# Patient Record
Sex: Female | Born: 1945 | Race: White | Hispanic: No | Marital: Single | Smoking: Never smoker
Health system: Southern US, Community
[De-identification: ages and names within clinical notes are randomized; demographics above are authoritative.]

## PROBLEM LIST (undated history)

## (undated) DIAGNOSIS — G629 Polyneuropathy, unspecified: Secondary | ICD-10-CM

## (undated) DIAGNOSIS — R000 Tachycardia, unspecified: Secondary | ICD-10-CM

## (undated) DIAGNOSIS — H409 Unspecified glaucoma: Secondary | ICD-10-CM

## (undated) DIAGNOSIS — F419 Anxiety disorder, unspecified: Secondary | ICD-10-CM

## (undated) DIAGNOSIS — D649 Anemia, unspecified: Secondary | ICD-10-CM

## (undated) DIAGNOSIS — I639 Cerebral infarction, unspecified: Secondary | ICD-10-CM

## (undated) DIAGNOSIS — I5189 Other ill-defined heart diseases: Secondary | ICD-10-CM

## (undated) DIAGNOSIS — I10 Essential (primary) hypertension: Secondary | ICD-10-CM

## (undated) DIAGNOSIS — M7500 Adhesive capsulitis of unspecified shoulder: Secondary | ICD-10-CM

## (undated) DIAGNOSIS — H3530 Unspecified macular degeneration: Secondary | ICD-10-CM

## (undated) DIAGNOSIS — R51 Headache: Secondary | ICD-10-CM

## (undated) DIAGNOSIS — E785 Hyperlipidemia, unspecified: Secondary | ICD-10-CM

## (undated) DIAGNOSIS — E669 Obesity, unspecified: Secondary | ICD-10-CM

## (undated) DIAGNOSIS — F32A Depression, unspecified: Secondary | ICD-10-CM

## (undated) DIAGNOSIS — E119 Type 2 diabetes mellitus without complications: Secondary | ICD-10-CM

## (undated) DIAGNOSIS — M1990 Unspecified osteoarthritis, unspecified site: Secondary | ICD-10-CM

## (undated) DIAGNOSIS — H269 Unspecified cataract: Secondary | ICD-10-CM

## (undated) DIAGNOSIS — F329 Major depressive disorder, single episode, unspecified: Secondary | ICD-10-CM

## (undated) DIAGNOSIS — G4730 Sleep apnea, unspecified: Secondary | ICD-10-CM

## (undated) DIAGNOSIS — Z87442 Personal history of urinary calculi: Secondary | ICD-10-CM

## (undated) DIAGNOSIS — R519 Headache, unspecified: Secondary | ICD-10-CM

## (undated) DIAGNOSIS — R011 Cardiac murmur, unspecified: Secondary | ICD-10-CM

## (undated) HISTORY — DX: Type 2 diabetes mellitus without complications: E11.9

## (undated) HISTORY — DX: Essential (primary) hypertension: I10

## (undated) HISTORY — PX: JOINT REPLACEMENT: SHX530

## (undated) HISTORY — PX: CARDIAC CATHETERIZATION: SHX172

## (undated) HISTORY — DX: Other ill-defined heart diseases: I51.89

## (undated) HISTORY — PX: TONSILLECTOMY AND ADENOIDECTOMY: SHX28

## (undated) HISTORY — DX: Obesity, unspecified: E66.9

## (undated) HISTORY — DX: Sleep apnea, unspecified: G47.30

## (undated) HISTORY — DX: Hyperlipidemia, unspecified: E78.5

## (undated) HISTORY — PX: COLONOSCOPY: SHX174

## (undated) HISTORY — PX: UPPER GI ENDOSCOPY: SHX6162

## (undated) HISTORY — PX: OTHER SURGICAL HISTORY: SHX169

---

## 2006-08-02 ENCOUNTER — Ambulatory Visit (HOSPITAL_BASED_OUTPATIENT_CLINIC_OR_DEPARTMENT_OTHER): Admission: RE | Admit: 2006-08-02 | Discharge: 2006-08-02 | Payer: Self-pay | Admitting: Orthopedic Surgery

## 2007-02-23 ENCOUNTER — Ambulatory Visit: Payer: Self-pay | Admitting: *Deleted

## 2008-03-20 ENCOUNTER — Ambulatory Visit (HOSPITAL_COMMUNITY): Admission: RE | Admit: 2008-03-20 | Discharge: 2008-03-20 | Payer: Self-pay | Admitting: Orthopedic Surgery

## 2008-04-02 ENCOUNTER — Inpatient Hospital Stay (HOSPITAL_BASED_OUTPATIENT_CLINIC_OR_DEPARTMENT_OTHER): Admission: RE | Admit: 2008-04-02 | Discharge: 2008-04-02 | Payer: Self-pay | Admitting: Cardiology

## 2008-04-16 ENCOUNTER — Inpatient Hospital Stay (HOSPITAL_COMMUNITY): Admission: RE | Admit: 2008-04-16 | Discharge: 2008-04-20 | Payer: Self-pay | Admitting: Orthopedic Surgery

## 2008-05-10 ENCOUNTER — Ambulatory Visit: Admission: RE | Admit: 2008-05-10 | Discharge: 2008-05-10 | Payer: Self-pay | Admitting: Orthopedic Surgery

## 2008-05-10 ENCOUNTER — Encounter (INDEPENDENT_AMBULATORY_CARE_PROVIDER_SITE_OTHER): Payer: Self-pay | Admitting: Orthopedic Surgery

## 2008-05-10 ENCOUNTER — Ambulatory Visit: Payer: Self-pay | Admitting: Vascular Surgery

## 2008-08-22 ENCOUNTER — Encounter: Admission: RE | Admit: 2008-08-22 | Discharge: 2008-08-22 | Payer: Self-pay | Admitting: Orthopedic Surgery

## 2008-11-05 ENCOUNTER — Other Ambulatory Visit: Payer: Self-pay | Admitting: Orthopedic Surgery

## 2008-11-06 ENCOUNTER — Ambulatory Visit (HOSPITAL_BASED_OUTPATIENT_CLINIC_OR_DEPARTMENT_OTHER): Admission: RE | Admit: 2008-11-06 | Discharge: 2008-11-07 | Payer: Self-pay | Admitting: Orthopedic Surgery

## 2008-11-06 ENCOUNTER — Other Ambulatory Visit: Payer: Self-pay | Admitting: Orthopedic Surgery

## 2011-02-08 LAB — GLUCOSE, CAPILLARY
Glucose-Capillary: 137 mg/dL — ABNORMAL HIGH (ref 70–99)
Glucose-Capillary: 153 mg/dL — ABNORMAL HIGH (ref 70–99)
Glucose-Capillary: 91 mg/dL (ref 70–99)

## 2011-02-08 LAB — BASIC METABOLIC PANEL
GFR calc non Af Amer: >60 mL/min (ref >60)
Glucose, Bld: 196 mg/dL — ABNORMAL HIGH (ref 70–99)
Potassium: 3.9 mEq/L (ref 3.5–5.1)

## 2011-03-09 NOTE — Op Note (Signed)
Jessica [name], Jessica [name]           ACCOUNT NO.:  [id number]   MEDICAL RECORD NO.:  [id number]          PATIENT TYPE:  AMB   LOCATION:  DSC                          FACILITY:  [facility]   PHYSICIAN:  Feliberto Gottron. Turner Daniels, M.D.   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   PROCEDURE:  Right shoulder arthroscopic anterior-inferior acromioplasty,  formal distal clavicle excision and debridement of superior degenerative  tearing of the labrum.  She also had a partial thickness internally for  rotator cuff tear that was debrided.   SURGEON:  Feliberto Gottron.  Turner Daniels, MD   FIRST ASSISTANT:  Shirl Harris, PA-C   ANESTHETIC:  General endotracheal plus right interscalene block.   ESTIMATED BLOOD LOSS:  Minimal.   FLUID REPLACEMENT:  800 mL of crystalloid.   DRAINS PLACED:  None.   TOURNIQUET TIME:  None.   INDICATIONS FOR PROCEDURE:  A 65 year old patient of one of my partner's  Dr. Renae Fickle who has been treated for right shoulder and left shoulder  impingement syndrome over the last few years with anti-inflammatory  medicines, Thera-Band exercises and a couple of cortisone injections.  Her pain persists.  She has had an MRI scan showing fairly impressive AC  joint arthritis that is confirmed also on the x-rays, a fairly large  type 3 subacromial spur with calcifications of the insertion of the CA  ligament as well as some arthritic changes in the glenohumeral joint and  a partial-thickness to possible full-thickness tear of the supraspinatus  that is small and focal in nature.  In any event, she has  failed  conservative measures and now desires arthroscopic evaluation and  treatment.  She is 65 years old.  She is a diabetic on an insulin pump.  Her BMI is in excess of 40 and she is relatively sedentary.  She is not  a good candidate for any sort of rotator cuff tear if a cuff tear is  found and the plan is simply to decompress her, do a distal clavicle  excision and debride any tears of the articular  cartilage, the labral  cartilage or the rotator cuff.  The risks and benefits of surgery have  been discussed, questions answered.   DESCRIPTION OF PROCEDURE:  The patient identified by armband and  underwent right shoulder interscalene block anesthetic at [facility].  She was then taken to operating room 8 where the  appropriate anesthetic monitors were attached and general endotracheal  anesthesia was induced.  She received preoperative antibiotics and then  was placed in the beach chair position.  The right upper extremity  prepped and draped in the usual sterile fashion from the wrist to the  hemithorax.  Using a #11 blade, standard portals were made at 1.5 cm  anterior to the [facility] joint, lateral to the junction middle and posterior  thirds of the acromion and posterior to the posterolateral corner of  acromion process.  The inflow was placed anteriorly with gravity inflow  with normal saline with epinephrine solution, the arthroscope laterally  and a 4.2 great white sucker shaver posteriorly allowing subacromial  bursectomy and outlining of a fairly impressive and large subacromial  spur.  Using a 4.5 hooded Vortex bur, the subacromial spur was removed.  We used the ArthroCare wand for electrocautery and then evaluated the  rotator cuff which did have partial thickness tearing of the external  leaflet but nothing requiring debridement externally.  We directed our  attention to the [facility] joint which was denuded of articular cartilage and  removed the distal inferior 1 cm  of the clavicle.  We then switched  portals bringing the scope in posteriorly, the inflow laterally and the  bur anteriorly completing the distal clavicle excision.  At this point,  the arthroscope was repositioned into the glenohumeral joint using the  posterior portal where we documented some fairly significant  degenerative tearing of the superior labrum from anterior to posterior  and this was debrided back to a stable margin with a 3.5 gator sucker  shaver.  The subscapularis tendon insertion was intact as was the biceps  and biceps origin.  The supraspinatus did have a small full-thickness  tear at the insertion on the anterior tubercle, the greater tuberosity  and this was left alone.  The infraspinatus appeared to be intact as  well.  The shoulder was irrigated out with normal saline solution.  The  arthroscopic instruments removed and dressing of Xeroform, 4x4 dressing  sponges, ABD, paper tape and a sling applied.  The patient was laid  supine, awakened and taken to the recovery room without difficulty.      Feliberto Gottron. Turner Daniels, M.D.  Electronically Signed     FJR/MEDQ  D:  11/06/2008  T:  11/07/2008  Job:  [id number]

## 2011-03-09 NOTE — Discharge Summary (Signed)
Jessica [name], DHAMI           ACCOUNT NO.:  [id number]   MEDICAL RECORD NO.:  [id number]          PATIENT TYPE:  INP   LOCATION:  1606                         FACILITY:  [facility]   PHYSICIAN:  Deidre Ala, M.D.    DATE OF BIRTH:  12/14/1945   DATE OF ADMISSION:  04/16/2008  DATE OF DISCHARGE:  04/20/2008                               DISCHARGE SUMMARY   FINAL DIAGNOSES:  1. 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  2. Diabetes mellitus, type II, on insulin pump.  3. Hypertension.  4. Hyperlipidemia.  5. Obstructive sleep apnea.  6. Morbid obesity.  7. Postop blood loss anemia.   PROCEDURE:  04/19/2008 right total knee arthroplasty.   SURGEON:  1. Charlesetta Shanks, M.D.   HISTORY:  This is a 65 year old morbidly obese Caucasian female with  type II diabetes, has been on insulin pump.  The patient had been  followed by Dr. Renae Fickle for knee pain.  She also had shoulder pain in the  past.  Her knee pain has become more chronic over the years and she  subsequently has failed medical management and was now ready to have a  surgical intervention.  The patient was subsequently scheduled for  surgery.   HOSPITAL COURSE:  The patient was admitted to [facility] on  04/16/2008.  At that time she underwent a total knee arthroplasty of the  right knee.  The patient tolerated the procedure well.  No  intraoperative complications occurred.  Postoperatively, the patient had  no significant complications.  She did have postoperative blood loss  anemia and on the 3rd postoperative day her hemoglobin was 7.6,  hematocrit 22.4.  Because of this she was transfused 2 units of packed  red cells.  Her hemoglobin was improved in the next 24 hours.  She was  otherwise doing well.  She was working with physical therapy.  Her  incision was clean and dry at the time of discharge.  She had no signs  of calf pain or Homan's sign.  Peripheral pulses are intact.  Neuro was  grossly  intact.  She was going to be prepared for discharge and was  ready for discharge on 04/20/2008.   MEDICATIONS:  At the time of discharge her medications were:  1. Lovenox 30 mg subcu q. 12 hours.  2. Dilaudid 2 mg, 1-2 p.o. q. 4-6 h p.r.n. for pain.  3. Robaxin 750 mg, 1 p.o. q. 80 p.r.n. pain.   She was continued on her other medications she was taking prior to  admission which were:  1. Metoprolol, 25 mg b.i.d.  2. Diovan HCT 320/25 q.a.m.  3. Simvastatin 40 mg q. a.m.  4. She is on NovoLog insulin pump.  5. She will continue to take calcium 600 plus D daily.  6. She is to resume Dorzolamide 2% eye drops, 1 drop both eyes 2 times      a day.  7. Amlodipine besylate 5 mg q. A.m.   ALLERGIES:  She has multiple allergies.  She has an allergy to CODEINE,  OXYCODONE, IODINE, ADVIL, Z-PACKS AND HYDROCODONE.  FOLLOW UP:  The patient would follow up with Dr. Renae Fickle in approximately  10 days after discharge.   DISPOSITION:  She was subsequently discharged to [facility] skilled  nursing facility on 04/20/2008 in satisfactory and stable condition.      Phineas Semen, P.A.    ______________________________  Seth Bake. Charlesetta Shanks, M.D.    CL/MEDQ  D:  04/19/2008  T:  04/19/2008  Job:  [id number]

## 2011-03-09 NOTE — Op Note (Signed)
Jessica [name] NO.:  [id number]   MEDICAL RECORD NO.:  [id number]          PATIENT TYPE:  INP   LOCATION:  0003                         FACILITY:  [facility]   PHYSICIAN:  Deidre Ala, M.D.    DATE OF BIRTH:  29-Sep-1946   DATE OF PROCEDURE:  04/16/2008  DATE OF DISCHARGE:                               OPERATIVE REPORT   PREOPERATIVE DIAGNOSES:  1. End-stage degenerative joint disease right knee.  2. High body mass index.   POSTOPERATIVE DIAGNOSES:  1. End-stage degenerative joint disease right knee.  2. High body mass index.   PROCEDURE:  Right total knee arthroplasty using cemented DePuy  components LCS type with rotating platform, MBT stem and cemented.   SURGEON:  1. Charlesetta Shanks, M.D.   ASSISTANT:  Phineas Semen, P.A.-C.   ANESTHESIA:  General with endotracheal with femoral nerve block.   CULTURES:  None.   DRAINS:  Two medium Hemovacs.   ESTIMATED BLOOD LOSS:  100 mL.   BLOOD REPLACED:  Without.   TOURNIQUET TIME:  97 minutes.   PATHOLOGIC FINDINGS AND HISTORY:  Jessica [name] is a 65 year old female  who has pounds per square inch issues.  She has right knee pain which we  did a knee scope with a jumper's knee excision.  She continued to have  discomfort and ended up being bone on bone medially after getting  initially scoped for degenerative medial meniscus tear.  Ultimately we  tried to get her to get a gastric bypass but that was to no avail.  She  did lose a bit of weight.  In any case, she was bone on bone, had  cardiac workup and was admitted for surgery.  At surgery, she had  tricompartmental DJD that was severe.  Her case was arduous secondary to  her significant obesity, but we were able to ultimately fit her with a  MBT revision cemented tray size 3, a standard LCS prosthesis with a 20  mm rotating platform.  We used a universal revision stem, 75 x 12 mm,  and an oval dome, 3-peg patella  all with cement with tobramycin and  used gentamicin at the end of the case that shall be described.  We had  full extension with flexion to 9 and with excellent ligament stability.  The first cut on the tibia was set the 6 mark, we it would take 5 more  just to get enough length but the flexion gap was increased, we  ultimately fit a 20.  We took of 10 mm of distal femur and fit the 20  with full extension, flexion to about 90 degrees with a field flexion  gap with stable ligaments.  We did a slight medial release.  He should  be noted that she had excellent overall alignment.  She had severe  disease but this again was arduous due to her fat layers.   DESCRIPTION OF PROCEDURE:  With adequate anesthesia obtained using  endotracheal technique with a femoral nerve block, the patient was  placed in the supine position.  The right lower extremity was prepped  from the toes  to the tourniquet in standard fashion.  After standard  prepping and draping, Esmarch exsanguination was used, the tourniquet  let up to 350, later 375 mmHg.  The old midline patellar incision was  then used so we made a midline incision over the patella.  Up and down  incision was deepened sharply with a knife and hemostasis obtained using  the Bovie electrocoagulator.  Dissection was carried down to the medial  retinaculum which was incised longitudinally.  We then everted the  patella, excised the fat pad, removed both menisci and the cruciates.  I  then amputated the tibial spine and reamed down the canal all the way to  a 13, could not get to a 14.  The 13 guide was put in place with the  tibial cutting jig.  The jig was set at the -6 cut and then we felt that  it would still be too tight because she had a preoperative flexion  contracture of 30 degrees and only flexed more to about 85.  We made  that cut and then sized her to a standard, placed the intramedullary  guide, placed a 20 C clamp to match that.  We decided not to downsize it  because we would  have notched, so made the anterior-posterior cuts,  sized to a 20.  Remembering that she was so tight in extension to get  the 20 extension, we took 1 cm on the distal femur, would have to have  taken more on the femur or taken more as we did on the tibia to get the  20 in full extension.  As it was, we placed the 4 degree distal valgus  femoral cutting jig in place and made those cuts and fit her with 20 in  extension and 20 in flexion with ligament balance with slight medial  release carried out.  We then placed the finishing guide on the distal  femur and made those cuts.  I then exposed the proximal tibia sized to a  3, made the central peg hole and drilled down the canal for a 12 and  placed the trial with the keel punch done.  I then trialed the rotating  platform 20 mm with the femur, articulated the knee through a range of  motion with good stability with full extension and flexion to 90-95 only  limited by the posterior fat.  I then calipered the patella with 24,  placed the cutting jig in place for a 35 taking 8.5 cut down and left  remaining with the caliper about 15-16, placed the 3-peg patella jig,  made three holes and trialed the patella.  All trial components were  then removed while we checked components as they came on the field for  sizing.  Thorough jet lavage was carried out.  We then mixed cement with  the tobramycin.  She had been given IV prophylaxis preoperatively of  Cipro and Ancef.  She was allergic to a Z-Pak.  We called the pharmacy,  they said there was not a problem giving her the tobramycin in the  cement and we also used gentamicin topically with irrigation into the  wound at closure.  In any case, two batches of cement were mixed with  1.3 grams of tobramycin per batch.  We then used the cement gun.  We  then cemented on the tibial component after assembling it with the  cement to the flutes but not past, and impacted it and removed excess  cement.  We  then put on the rotating platform.  We then cemented on the  femoral component, impacted it, removed excess cement, had the knee in  full extension, removed excess cement.  I then cemented on the patella  component and impacted it and removed excess cement.  When the cement  had cured, just before additional irrigation was carried out with a jet  lavaged, the tourniquet was let down and bleeding points were  cauterized.  Hemovac drains were placed in the medial lateral portal and  brought out through the superior lateral portal.  Medial lateral gutters  were then brought out the superior lateral portal.  The wound was then  closed in layers with #1 Vicryl figure-of-eight on the retinaculum with  a running locking oversew of #1 PDS, 0 Vicryl on the subcu with a 2-0  quills was used in the mid subcu and then 2-0 Vicryl running  subcuticular and skin staples.  Hemovac was hooked up to Autovac after  injecting the knee with a mixture of 40 mg of gentamicin and 20 mL of  saline solution in one of the limbs then it was clamp with a  rubber shod and will be charged in about 6 hours postop.  We then placed  a bulky sterile compressive dressing with knee immobilizer.  The  patient, having tolerated procedure well, was awakened and taken to the  recovery room in satisfactory condition to be admitted for routine  postoperative care, analgesia and CPM.           ______________________________  V. Charlesetta Shanks, M.D.     VEP/MEDQ  D:  04/16/2008  T:  04/16/2008  Job:  [id number]   cc:   Sharlet Salina, M.D.  Fax: [phone]

## 2011-03-09 NOTE — H&P (Signed)
Jessica [name], Jessica [name]           ACCOUNT NO.:  [id number]   MEDICAL RECORD NO.:  [id number]          PATIENT TYPE:  INP   LOCATION:  1606                         FACILITY:  [facility]   PHYSICIAN:  Deidre Ala, M.D.    DATE OF BIRTH:  06-10-1946   DATE OF ADMISSION:  04/16/2008  DATE OF DISCHARGE:  04/20/2008                              HISTORY & PHYSICAL   CHIEF COMPLAINT:  Chronic right knee pain.   HISTORY:  This is a 65 year old Caucasian female who is followed by Dr.  Renae Fickle.  She has been treated for right knee pain for some time.  She has  had injections, and she has failed this, and she is ready for total knee  arthroplasty.  The problem is that she is morbidly obese.  We needed to  have her lose some weight, which we worked on with her.  Finally, she  got to the point where she just had to have a knee replacement.  We will  schedule her for such.  She continues to have a significant amount of  weight.  She is diabetic and on an insulin pump.  Risks of surgery were  explained to the patient.  The patient understood and agrees to surgery.   PAST MEDICAL HISTORY/REVIEW OF SYSTEMS:  The patient has had a history  of some chest pain in the past.  Had a stress test which apparently was  negative.  She does have a history of glaucoma.  She has had renal  ultrasounds in the past.  She has had a C-section in 1973, tonsillectomy  in 1954, shoulder manipulation in 2004.   She currently is on:  1. Metoprolol 25 mg b.i.d.  2. Diovan/HCT 325/25 q.a.m.  3. Simvastatin 40 mg q.a.m.  4. NovoLog insulin, with an insulin pump.  5. A multivitamin a day.  6. Calcium 600 with vitamin D once a day.  7. Fish oil once a day.  8. CoQ-10 daily.  9. Aspirin 81 mg daily.  10.Vitamin C daily.  11.Travatan 0.004% eye drops, 1 drop to each eye daily.  12.Dorzolamide HCl 2% to both eyes twice a day.  13.Amlodipine besylate 5 mg daily.   ALLERGIES:  1. PERCOCET.  2. VICODIN.  3. OXYCODONE.  4.  CODEINE.  5. IODINE.  6. Z-PAK.  7. ADVIL.   REVIEW OF SYSTEMS:  The patient is an insulin-dependent diabetic.  She  does have hypertension.  No COPD, PND, PAD, syncope, hemoptysis,  hematemesis, or asthma.   FAMILY HISTORY:  Noncontributory.   PHYSICAL EXAMINATION:  GENERAL:  This is a well-developed, morbidly  obese, 65 year old Caucasian female, alert, oriented, cooperative with  examination.  HEENT:  [place]/AT.  EOMI.  PERRL.  Oropharynx is clear.  Mucous membranes  are pink and moist.  NECK:  Supple, without JVP, lymphadenopathy, or thyromegaly.  No carotid  bruits noted.  Trachea is midline.  CHEST:  Symmetrical respirations, clear to auscultation.  No wheezes,  rhonchi, or rales noted.  CARDIOVASCULAR:  Regular rate and rhythm, without murmur, rub, or  gallop.  ABDOMEN:  Soft.  Bowel sounds are present in all  four quadrants.  No  palpable masses.  No HSM.  No hernias.  GU/RECTAL:  Deferred.  EXTREMITIES:  Without clubbing, cyanosis, or edema.  There is tenderness  to palpation in the right knee.  No significant swelling noted.  Peripheral pulses intact.  NEUROLOGIC:  Cranial nerves II-XII grossly intact, without focal  deficits.   IMPRESSION:  1. End-stage degenerative joint disease right knee, with chronic pain.  2. Type I diabetes mellitus, on insulin pump.  3. Hypertension.  4. History of renal stones.   PLAN:  The patient will undergo a total right knee arthroplasty with a  DePuy LCS system with MBT stem and rotating platform.      Phineas Semen, P.A.    ______________________________  Seth Bake. Charlesetta Shanks, M.D.    CL/MEDQ  D:  05/07/2008  T:  05/07/2008  Job:  [id number]

## 2011-03-09 NOTE — Cardiovascular Report (Signed)
NAMEANNEL, ZUNKER NO.:  [id number]   MEDICAL RECORD NO.:  [id number]          PATIENT TYPE:  OIB   LOCATION:  1962                         FACILITY:  [facility]   PHYSICIAN:  Jake Bathe, MD      DATE OF BIRTH:  02-09-1946   DATE OF PROCEDURE:  04/02/2008  DATE OF DISCHARGE:  04/02/2008                            CARDIAC CATHETERIZATION   PROCEDURES:  1. Left heart catheterization.  2. Left ventriculography.  3. Selective coronary angiography.   INDICATIONS:  A 65 year old female with morbid obesity, diabetes,  hypertension, hyperlipidemia, with a knee osteoarthritis, and with a  recent nuclear stress test concerning for anterior wall ischemia, here  for further preoperative risk evaluation.   PROCEDURE DETAILS:  Informed consent was obtained, risks and benefits  including stroke, heart attack, death, arterial damage, and renal  impairment were explained to the patient at length.  She was placed on  the catheterization table and prepped in a sterile fashion.  Lidocaine  1% was used to infiltrate the right groin for local anesthesia.  Femoral  head was visualized with fluoroscopy.  Using the modified Seldinger  technique, a 4-French sheath was placed in the right femoral artery.  Using fluoroscopic guidance, a Judkins left 4 catheter was selectively  cannulated into the left main artery and multiple views of the Omnipaque  hand injection were obtained.  This catheter was then exchanged over the  wire for a torque Williams Right Catheter which was used to selectively  cannulate the right coronary.  Multiple views of Omnipaque hand  injection were obtained.  This catheter was then exchanged for an angled  pigtail to use across the aortic valve into the left ventricle.  A  powered injection, left ventriculogram was obtained using 30 mL of dye.  Pullback was obtained across the aortic valve.  Catheter was then  brought to the level of L1 and an abdominal aortogram  was obtained.   FINDINGS:  1. Left main artery - short, no disease.  2. Left anterior descending artery - 1 large diagonal branch.  No      angiographically significant coronary artery disease.  3. Circumflex artery - 3 obtuse marginal branches.  No      angiographically significant coronary artery disease.  4. 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  No angiographically      significant coronary artery disease.  5. Left ventriculogram - normal ejection fraction estimated at 65%      with no wall motion abnormalities.  No mitral regurgitation.   HEMODYNAMICS:  Left ventricular systolic pressure was 130 with a left  ventricular end-diastolic pressure of 18 mmHg.  Aortic pressure was  130/67 with a mean of 95, with no aortic valve gradient.  Note, forearm  cuff pressure at the same time was 165 systolic, which is a 35-point  discrepancy between central pressures.  Abdominal aortogram - no  significant abdominal aortic atherosclerosis.  Renal arteries appear to  take off around the level of the mid body of L2.  No obvious renal  artery stenoses bilaterally.   IMPRESSIONS:  1. No angiographically significant coronary  artery disease.  2. Normal left ventricular ejection fraction, estimated at 65% with no      wall motion abnormalities.  3. No obvious atherosclerosis of the descending aorta with no obvious      renal artery stenosis bilaterally.   RECOMMENDATIONS:  The patient tolerated the procedure well.  No evidence  of IV contrast allergy.  She took her prednisone prior to procedure.  Given the angiographic findings as described above, I feel as though she  is at low risk from a cardiovascular standpoint for knee replacement.  Continue with her aggressive risk factor modification which includes  diabetes, hypertension, and hyperlipidemia control.  We will give her a  prescription for low-dose metoprolol, to be taken 1 week prior and 1  week after surgery.      Jake Bathe, MD  Electronically Signed     MCS/MEDQ  D:  04/02/2008  T:  04/03/2008  Job:  [id number]   cc:   Dr. Doristine Section  Pam Drown, M.D.

## 2011-03-12 NOTE — Op Note (Signed)
Jessica [name], Jessica [name]           ACCOUNT NO.:  [id number]   MEDICAL RECORD NO.:  [id number]          PATIENT TYPE:  AMB   LOCATION:  [facility]                         FACILITY:  [facility]   PHYSICIAN:  Deidre Ala, M.D.    DATE OF BIRTH:  10-Jul-1946   DATE OF PROCEDURE:  08/02/2006  DATE OF DISCHARGE:  08/02/2006                                 OPERATIVE REPORT   PREOPERATIVE DIAGNOSES:  1. Degenerative medial meniscus tear.  2. Prepatellar bursitis.  3. Osteoarthritis, knee.   POSTOPERATIVE DIAGNOSES:  1. Right knee degenerative posteromedial and inner rim lateral meniscus      tearing.  2. 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  3. Prepatellar bursitis.  4. Tight lateral retinaculum.  5. Parapatellar and track compartment synovitis.   PROCEDURE:  1. Right knee operative arthroscopy with degenerative medial and lateral      partial meniscectomies.  2. Abrasion ablation chondroplasties throughout.  3. Arthroscopic lateral retinacular release.  4. Open prepatellar bursa excision.   SURGEON:  1. Charlesetta Shanks, M.D.   ASSISTANT:  Clarene Reamer, PA-C.   ANESTHESIA:  General endotracheal.   CULTURES:  None.   DRAINS:  None.   ESTIMATED BLOOD LOSS:  Less than 100 cc, replaced without.   PATHOLOGIC FINDINGS AND HISTORY:  Ms. Jessica [name] is a high body mass index  female 65 years old who presented with knee pain Dr. Crista Luria with  painful knee.  She has failed cortisone injections.  She first came in in  April of this year and we injected her.  She does have diabetes and so  cortisone was not long-term helpful and did a elevate her sugar somewhat.  She came back in with knee pain, more catching, more locking, giving way,  and she had an MRI scan which suggested degenerative medial meniscus tear  and prepatellar bursitis with tricompartmental change.  She wanted to  proceed with knee arthroscopy and open excision of the prepatellar bursa due  to  continued knee pain and some mechanical symptoms.  She, at surgery, had  large symptomatic plicas.  She had an osteophyte on the medial femoral  condyle and in the notch.  ACL was intact.  She had a degenerative posterior  horn medial meniscus tear that was significant and unstable and an inner rim  lateral degenerative meniscus tear.  She had a tight lateral retinaculum.  She had a quarter sized defect, grade III, on the trochlea and posterior  patella, and the whole medial femoral condyle was grade IV over top of the  degenerative meniscus.  All of this was debrided and smoothed with lateral  retinacular release and synovectomy.  She had a prepatellar bursitis that  was basically palpable fronds of synovium with intermittent swelling and it  was bothering her with kneeling, etc., so with a positive MRI findings we  elected to excise it.  It was somewhat swollen from the leak-out from the  knee portals but we were able to get the wall out and closed it back down to  the fascia with a technique of through-and-through mattress Vicryl sutures.  Overall  smooth out and abrasion ablation was carried out to effect the knee  being a more functional unit with less debris.   PROCEDURE:  With adequate anesthesia obtained using endotracheal technique,  1 gram Ancef given IV prophylaxis.  The patient was placed in the supine  position.  The right lower extremity was prepped from the malleoli to the  knee using a Hibiclens scrub with alcohol due to a Betadine and iodine  allergy.  After standard prepping and draping, Esmarch exsanguination was  used.  The tourniquet was let up to 350 mmHg.  Superior lateral inflow  portals were made.  The knee was insufflated with normal saline with an  arthroscopic pump.  Medial and lateral scope portals were then made and the  joint was thoroughly inspected.  I then lysed the medial plica back to the  sidewall and lysed the medial band.  I then shaved the trochlear  defect in  the posterior patella, used a basket to further smooth the edges and an  ablator on one to smooth.  I then cleared the medial osteophyte and the  notch osteophytes with a rotary bur.  I then isolated the medial meniscus  tear and used basket and shaver to saucerize it back to a stable rim using  the ablator on one to smooth as well as the medial femoral condyle and  medial tibial plateau.  The inner rim of the lateral meniscus was also so  shaved and smoothed.  I then shaved out the lateral gutter synovitis.  I  then observed tilt and track and did an arthroscopic lateral retinacular  release from vastus lateralis to the joint line.  Further shaving on the  posterior patella was carried out as well as the pouch.  The knee was then  irrigated through the scope.  The portals were closed with 4-0 nylon.  We  then repainted with alcohol, made a longitudinal incision over the patella,  dissected down removing the bursal cyst wall down to the fascia.  Irrigation  was carried out.  We closed the subcu down to the fascia with a vertical  mattress suture through-and-through the skin with 2-0 Vicryl and 3-0 Vicryl  subcu and then skin staples.  Marcaine 0.5% was injected in and about the  portals with morphine.  A bulky sterile compressive dressing was applied  with Easy Wrap placed.  The patient having tolerated procedure well was  awakened and taken to recovery room in satisfactory condition to be  discharged per outpatient routine, given Percocet for pain, told call the  office for recheck tomorrow.           ______________________________  V. Charlesetta Shanks, M.D.     VEP/MEDQ  D:  08/02/2006  T:  08/04/2006  Job:  [id number]   cc:   Sharlet Salina, M.D.  Fax: [phone]

## 2011-07-21 LAB — DIFFERENTIAL
Eosinophils Relative: 2
Neutro Abs: 4.6
Neutrophils Relative %: 68

## 2011-07-21 LAB — CBC
HCT: 36
MCHC: 33.9
RDW: 15.3

## 2011-07-21 LAB — URINE CULTURE: Colony Count: >=100000

## 2011-07-21 LAB — URINALYSIS, ROUTINE W REFLEX MICROSCOPIC
Glucose, UA: NEGATIVE
Hgb urine dipstick: NEGATIVE
Ketones, ur: NEGATIVE
pH: 7

## 2011-07-21 LAB — COMPREHENSIVE METABOLIC PANEL
ALT: 24
Albumin: 4
Alkaline Phosphatase: 58
BUN: 17
Calcium: 9.8
Chloride: 101
Creatinine, Ser: 0.58
GFR calc Af Amer: >60
GFR calc non Af Amer: >60
Sodium: 140
Total Bilirubin: 0.5

## 2011-07-21 LAB — URINE MICROSCOPIC-ADD ON

## 2011-07-21 LAB — PROTIME-INR
INR: 0.9
Prothrombin Time: 12.8

## 2011-07-22 LAB — BASIC METABOLIC PANEL
BUN: 8
BUN: 9
CO2: 28
Calcium: 8.4
Chloride: 100
Chloride: 103
Creatinine, Ser: 0.57
Creatinine, Ser: 0.62
GFR calc Af Amer: >60
Glucose, Bld: 109 — ABNORMAL HIGH
Potassium: 3.7

## 2011-07-22 LAB — CBC
HCT: 22.4 — ABNORMAL LOW
HCT: 26.9 — ABNORMAL LOW
HCT: 27.8 — ABNORMAL LOW
HCT: 37.1
Hemoglobin: 12.7
Hemoglobin: 9.7 — ABNORMAL LOW
MCHC: 34.5
MCV: 78.1
MCV: 78.9
MCV: 79.3
MCV: 80.7
Platelets: 147 — ABNORMAL LOW
Platelets: 152
Platelets: 171
RBC: 2.84 — ABNORMAL LOW
RBC: 3.06 — ABNORMAL LOW
RBC: 4.75
RDW: 15.4
RDW: 15.4
WBC: 10.2
WBC: 8.2
WBC: 8.7
WBC: 9.5
WBC: 9.9

## 2011-07-22 LAB — DIFFERENTIAL
Basophils Absolute: 0
Lymphocytes Relative: 18
Lymphs Abs: 1.4
Monocytes Relative: 7
Neutro Abs: 6

## 2011-07-22 LAB — COMPREHENSIVE METABOLIC PANEL
ALT: 21
AST: 19
Alkaline Phosphatase: 60
BUN: 15
CO2: 30
GFR calc Af Amer: >60
GFR calc non Af Amer: >60
Sodium: 140

## 2011-07-22 LAB — URINE MICROSCOPIC-ADD ON

## 2011-07-22 LAB — URINALYSIS, ROUTINE W REFLEX MICROSCOPIC
Glucose, UA: NEGATIVE
Hgb urine dipstick: NEGATIVE
Protein, ur: NEGATIVE
Specific Gravity, Urine: 1.017
Urobilinogen, UA: 0.2

## 2011-07-22 LAB — URINE CULTURE: Special Requests: NEGATIVE

## 2011-07-22 LAB — TYPE AND SCREEN

## 2011-07-22 LAB — PROTIME-INR: INR: 0.9

## 2011-07-22 LAB — APTT: aPTT: 26

## 2011-07-22 LAB — POCT I-STAT GLUCOSE: Operator id: 133881

## 2011-11-04 DIAGNOSIS — H401290 Low-tension glaucoma, unspecified eye, stage unspecified: Secondary | ICD-10-CM | POA: Diagnosis not present

## 2011-11-04 DIAGNOSIS — H04129 Dry eye syndrome of unspecified lacrimal gland: Secondary | ICD-10-CM | POA: Diagnosis not present

## 2011-11-04 DIAGNOSIS — H409 Unspecified glaucoma: Secondary | ICD-10-CM | POA: Diagnosis not present

## 2011-11-04 DIAGNOSIS — H1045 Other chronic allergic conjunctivitis: Secondary | ICD-10-CM | POA: Diagnosis not present

## 2011-11-09 DIAGNOSIS — M25519 Pain in unspecified shoulder: Secondary | ICD-10-CM | POA: Diagnosis not present

## 2011-11-09 DIAGNOSIS — M5412 Radiculopathy, cervical region: Secondary | ICD-10-CM | POA: Diagnosis not present

## 2011-11-09 DIAGNOSIS — R29898 Other symptoms and signs involving the musculoskeletal system: Secondary | ICD-10-CM | POA: Diagnosis not present

## 2011-11-09 DIAGNOSIS — M5030 Other cervical disc degeneration, unspecified cervical region: Secondary | ICD-10-CM | POA: Diagnosis not present

## 2011-11-15 DIAGNOSIS — M25519 Pain in unspecified shoulder: Secondary | ICD-10-CM | POA: Diagnosis not present

## 2011-11-15 DIAGNOSIS — M810 Age-related osteoporosis without current pathological fracture: Secondary | ICD-10-CM | POA: Diagnosis not present

## 2011-11-15 DIAGNOSIS — R29898 Other symptoms and signs involving the musculoskeletal system: Secondary | ICD-10-CM | POA: Diagnosis not present

## 2011-11-17 DIAGNOSIS — I10 Essential (primary) hypertension: Secondary | ICD-10-CM | POA: Diagnosis not present

## 2011-11-22 DIAGNOSIS — M25519 Pain in unspecified shoulder: Secondary | ICD-10-CM | POA: Diagnosis not present

## 2011-11-22 DIAGNOSIS — R29898 Other symptoms and signs involving the musculoskeletal system: Secondary | ICD-10-CM | POA: Diagnosis not present

## 2011-11-24 DIAGNOSIS — M25519 Pain in unspecified shoulder: Secondary | ICD-10-CM | POA: Diagnosis not present

## 2011-11-24 DIAGNOSIS — R29898 Other symptoms and signs involving the musculoskeletal system: Secondary | ICD-10-CM | POA: Diagnosis not present

## 2011-11-26 DIAGNOSIS — R29898 Other symptoms and signs involving the musculoskeletal system: Secondary | ICD-10-CM | POA: Diagnosis not present

## 2011-11-26 DIAGNOSIS — M25519 Pain in unspecified shoulder: Secondary | ICD-10-CM | POA: Diagnosis not present

## 2011-11-29 DIAGNOSIS — R29898 Other symptoms and signs involving the musculoskeletal system: Secondary | ICD-10-CM | POA: Diagnosis not present

## 2011-11-29 DIAGNOSIS — M25519 Pain in unspecified shoulder: Secondary | ICD-10-CM | POA: Diagnosis not present

## 2011-12-01 DIAGNOSIS — R29898 Other symptoms and signs involving the musculoskeletal system: Secondary | ICD-10-CM | POA: Diagnosis not present

## 2011-12-03 DIAGNOSIS — L608 Other nail disorders: Secondary | ICD-10-CM | POA: Diagnosis not present

## 2011-12-03 DIAGNOSIS — E119 Type 2 diabetes mellitus without complications: Secondary | ICD-10-CM | POA: Diagnosis not present

## 2011-12-03 DIAGNOSIS — M5412 Radiculopathy, cervical region: Secondary | ICD-10-CM | POA: Diagnosis not present

## 2011-12-03 DIAGNOSIS — M25519 Pain in unspecified shoulder: Secondary | ICD-10-CM | POA: Diagnosis not present

## 2011-12-03 DIAGNOSIS — S90129A Contusion of unspecified lesser toe(s) without damage to nail, initial encounter: Secondary | ICD-10-CM | POA: Diagnosis not present

## 2011-12-07 DIAGNOSIS — M25569 Pain in unspecified knee: Secondary | ICD-10-CM | POA: Diagnosis not present

## 2011-12-07 DIAGNOSIS — M25519 Pain in unspecified shoulder: Secondary | ICD-10-CM | POA: Diagnosis not present

## 2011-12-07 DIAGNOSIS — M5412 Radiculopathy, cervical region: Secondary | ICD-10-CM | POA: Diagnosis not present

## 2011-12-07 DIAGNOSIS — M5020 Other cervical disc displacement, unspecified cervical region: Secondary | ICD-10-CM | POA: Diagnosis not present

## 2011-12-09 DIAGNOSIS — M5412 Radiculopathy, cervical region: Secondary | ICD-10-CM | POA: Diagnosis not present

## 2011-12-09 DIAGNOSIS — R29898 Other symptoms and signs involving the musculoskeletal system: Secondary | ICD-10-CM | POA: Diagnosis not present

## 2011-12-13 DIAGNOSIS — R29898 Other symptoms and signs involving the musculoskeletal system: Secondary | ICD-10-CM | POA: Diagnosis not present

## 2011-12-13 DIAGNOSIS — M5412 Radiculopathy, cervical region: Secondary | ICD-10-CM | POA: Diagnosis not present

## 2011-12-15 DIAGNOSIS — R29898 Other symptoms and signs involving the musculoskeletal system: Secondary | ICD-10-CM | POA: Diagnosis not present

## 2011-12-30 DIAGNOSIS — M5020 Other cervical disc displacement, unspecified cervical region: Secondary | ICD-10-CM | POA: Diagnosis not present

## 2011-12-30 DIAGNOSIS — M25569 Pain in unspecified knee: Secondary | ICD-10-CM | POA: Diagnosis not present

## 2012-01-14 DIAGNOSIS — M25569 Pain in unspecified knee: Secondary | ICD-10-CM | POA: Diagnosis not present

## 2012-01-27 DIAGNOSIS — H409 Unspecified glaucoma: Secondary | ICD-10-CM | POA: Diagnosis not present

## 2012-01-27 DIAGNOSIS — H35729 Serous detachment of retinal pigment epithelium, unspecified eye: Secondary | ICD-10-CM | POA: Diagnosis not present

## 2012-01-27 DIAGNOSIS — H353190 Nonexudative age-related macular degeneration, unspecified eye, stage unspecified: Secondary | ICD-10-CM | POA: Diagnosis not present

## 2012-01-27 DIAGNOSIS — H04129 Dry eye syndrome of unspecified lacrimal gland: Secondary | ICD-10-CM | POA: Diagnosis not present

## 2012-01-27 DIAGNOSIS — H2510 Age-related nuclear cataract, unspecified eye: Secondary | ICD-10-CM | POA: Diagnosis not present

## 2012-01-27 DIAGNOSIS — H401290 Low-tension glaucoma, unspecified eye, stage unspecified: Secondary | ICD-10-CM | POA: Diagnosis not present

## 2012-02-04 DIAGNOSIS — L84 Corns and callosities: Secondary | ICD-10-CM | POA: Diagnosis not present

## 2012-02-04 DIAGNOSIS — L608 Other nail disorders: Secondary | ICD-10-CM | POA: Diagnosis not present

## 2012-02-04 DIAGNOSIS — E119 Type 2 diabetes mellitus without complications: Secondary | ICD-10-CM | POA: Diagnosis not present

## 2012-02-10 DIAGNOSIS — N61 Mastitis without abscess: Secondary | ICD-10-CM | POA: Diagnosis not present

## 2012-02-10 DIAGNOSIS — I10 Essential (primary) hypertension: Secondary | ICD-10-CM | POA: Diagnosis not present

## 2012-02-10 DIAGNOSIS — G609 Hereditary and idiopathic neuropathy, unspecified: Secondary | ICD-10-CM | POA: Diagnosis not present

## 2012-02-10 DIAGNOSIS — Z23 Encounter for immunization: Secondary | ICD-10-CM | POA: Diagnosis not present

## 2012-02-10 DIAGNOSIS — E1149 Type 2 diabetes mellitus with other diabetic neurological complication: Secondary | ICD-10-CM | POA: Diagnosis not present

## 2012-02-10 DIAGNOSIS — E782 Mixed hyperlipidemia: Secondary | ICD-10-CM | POA: Diagnosis not present

## 2012-03-16 DIAGNOSIS — G608 Other hereditary and idiopathic neuropathies: Secondary | ICD-10-CM | POA: Diagnosis not present

## 2012-03-16 DIAGNOSIS — E7800 Pure hypercholesterolemia, unspecified: Secondary | ICD-10-CM | POA: Diagnosis not present

## 2012-03-16 DIAGNOSIS — I10 Essential (primary) hypertension: Secondary | ICD-10-CM | POA: Diagnosis not present

## 2012-04-07 DIAGNOSIS — E119 Type 2 diabetes mellitus without complications: Secondary | ICD-10-CM | POA: Diagnosis not present

## 2012-04-07 DIAGNOSIS — L608 Other nail disorders: Secondary | ICD-10-CM | POA: Diagnosis not present

## 2012-05-25 DIAGNOSIS — Z1231 Encounter for screening mammogram for malignant neoplasm of breast: Secondary | ICD-10-CM | POA: Diagnosis not present

## 2012-06-16 DIAGNOSIS — E119 Type 2 diabetes mellitus without complications: Secondary | ICD-10-CM | POA: Diagnosis not present

## 2012-06-16 DIAGNOSIS — L608 Other nail disorders: Secondary | ICD-10-CM | POA: Diagnosis not present

## 2012-06-19 DIAGNOSIS — E7800 Pure hypercholesterolemia, unspecified: Secondary | ICD-10-CM | POA: Diagnosis not present

## 2012-06-19 DIAGNOSIS — G608 Other hereditary and idiopathic neuropathies: Secondary | ICD-10-CM | POA: Diagnosis not present

## 2012-06-19 DIAGNOSIS — I10 Essential (primary) hypertension: Secondary | ICD-10-CM | POA: Diagnosis not present

## 2012-06-29 DIAGNOSIS — M654 Radial styloid tenosynovitis [de Quervain]: Secondary | ICD-10-CM | POA: Diagnosis not present

## 2012-06-30 DIAGNOSIS — M25569 Pain in unspecified knee: Secondary | ICD-10-CM | POA: Diagnosis not present

## 2012-06-30 DIAGNOSIS — M654 Radial styloid tenosynovitis [de Quervain]: Secondary | ICD-10-CM | POA: Diagnosis not present

## 2012-07-03 DIAGNOSIS — M25569 Pain in unspecified knee: Secondary | ICD-10-CM | POA: Diagnosis not present

## 2012-07-03 DIAGNOSIS — M654 Radial styloid tenosynovitis [de Quervain]: Secondary | ICD-10-CM | POA: Diagnosis not present

## 2012-07-05 DIAGNOSIS — M654 Radial styloid tenosynovitis [de Quervain]: Secondary | ICD-10-CM | POA: Diagnosis not present

## 2012-07-05 DIAGNOSIS — M25569 Pain in unspecified knee: Secondary | ICD-10-CM | POA: Diagnosis not present

## 2012-07-11 DIAGNOSIS — M654 Radial styloid tenosynovitis [de Quervain]: Secondary | ICD-10-CM | POA: Diagnosis not present

## 2012-07-11 DIAGNOSIS — M25569 Pain in unspecified knee: Secondary | ICD-10-CM | POA: Diagnosis not present

## 2012-07-13 DIAGNOSIS — M25569 Pain in unspecified knee: Secondary | ICD-10-CM | POA: Diagnosis not present

## 2012-07-13 DIAGNOSIS — M654 Radial styloid tenosynovitis [de Quervain]: Secondary | ICD-10-CM | POA: Diagnosis not present

## 2012-07-18 DIAGNOSIS — M654 Radial styloid tenosynovitis [de Quervain]: Secondary | ICD-10-CM | POA: Diagnosis not present

## 2012-07-20 DIAGNOSIS — M25569 Pain in unspecified knee: Secondary | ICD-10-CM | POA: Diagnosis not present

## 2012-07-20 DIAGNOSIS — M654 Radial styloid tenosynovitis [de Quervain]: Secondary | ICD-10-CM | POA: Diagnosis not present

## 2012-07-27 DIAGNOSIS — M25569 Pain in unspecified knee: Secondary | ICD-10-CM | POA: Diagnosis not present

## 2012-08-02 DIAGNOSIS — M654 Radial styloid tenosynovitis [de Quervain]: Secondary | ICD-10-CM | POA: Diagnosis not present

## 2012-08-02 DIAGNOSIS — M25569 Pain in unspecified knee: Secondary | ICD-10-CM | POA: Diagnosis not present

## 2012-08-04 DIAGNOSIS — M25569 Pain in unspecified knee: Secondary | ICD-10-CM | POA: Diagnosis not present

## 2012-08-04 DIAGNOSIS — M654 Radial styloid tenosynovitis [de Quervain]: Secondary | ICD-10-CM | POA: Diagnosis not present

## 2012-08-07 DIAGNOSIS — Z23 Encounter for immunization: Secondary | ICD-10-CM | POA: Diagnosis not present

## 2012-08-07 DIAGNOSIS — E782 Mixed hyperlipidemia: Secondary | ICD-10-CM | POA: Diagnosis not present

## 2012-08-07 DIAGNOSIS — I10 Essential (primary) hypertension: Secondary | ICD-10-CM | POA: Diagnosis not present

## 2012-08-07 DIAGNOSIS — E1149 Type 2 diabetes mellitus with other diabetic neurological complication: Secondary | ICD-10-CM | POA: Diagnosis not present

## 2012-08-07 DIAGNOSIS — E559 Vitamin D deficiency, unspecified: Secondary | ICD-10-CM | POA: Diagnosis not present

## 2012-08-07 DIAGNOSIS — G609 Hereditary and idiopathic neuropathy, unspecified: Secondary | ICD-10-CM | POA: Diagnosis not present

## 2012-08-07 DIAGNOSIS — N61 Mastitis without abscess: Secondary | ICD-10-CM | POA: Diagnosis not present

## 2012-08-07 DIAGNOSIS — E119 Type 2 diabetes mellitus without complications: Secondary | ICD-10-CM | POA: Diagnosis not present

## 2012-08-08 DIAGNOSIS — E1149 Type 2 diabetes mellitus with other diabetic neurological complication: Secondary | ICD-10-CM | POA: Diagnosis not present

## 2012-08-08 DIAGNOSIS — E782 Mixed hyperlipidemia: Secondary | ICD-10-CM | POA: Diagnosis not present

## 2012-08-08 DIAGNOSIS — Z23 Encounter for immunization: Secondary | ICD-10-CM | POA: Diagnosis not present

## 2012-08-08 DIAGNOSIS — D649 Anemia, unspecified: Secondary | ICD-10-CM | POA: Diagnosis not present

## 2012-08-08 DIAGNOSIS — I10 Essential (primary) hypertension: Secondary | ICD-10-CM | POA: Diagnosis not present

## 2012-08-08 DIAGNOSIS — E559 Vitamin D deficiency, unspecified: Secondary | ICD-10-CM | POA: Diagnosis not present

## 2012-08-08 DIAGNOSIS — M949 Disorder of cartilage, unspecified: Secondary | ICD-10-CM | POA: Diagnosis not present

## 2012-08-08 DIAGNOSIS — G609 Hereditary and idiopathic neuropathy, unspecified: Secondary | ICD-10-CM | POA: Diagnosis not present

## 2012-08-08 DIAGNOSIS — M899 Disorder of bone, unspecified: Secondary | ICD-10-CM | POA: Diagnosis not present

## 2012-08-10 DIAGNOSIS — M654 Radial styloid tenosynovitis [de Quervain]: Secondary | ICD-10-CM | POA: Diagnosis not present

## 2012-08-15 DIAGNOSIS — M654 Radial styloid tenosynovitis [de Quervain]: Secondary | ICD-10-CM | POA: Diagnosis not present

## 2012-08-15 DIAGNOSIS — M25569 Pain in unspecified knee: Secondary | ICD-10-CM | POA: Diagnosis not present

## 2012-08-17 DIAGNOSIS — M654 Radial styloid tenosynovitis [de Quervain]: Secondary | ICD-10-CM | POA: Diagnosis not present

## 2012-08-17 DIAGNOSIS — M25569 Pain in unspecified knee: Secondary | ICD-10-CM | POA: Diagnosis not present

## 2012-08-18 DIAGNOSIS — E119 Type 2 diabetes mellitus without complications: Secondary | ICD-10-CM | POA: Diagnosis not present

## 2012-08-18 DIAGNOSIS — L84 Corns and callosities: Secondary | ICD-10-CM | POA: Diagnosis not present

## 2012-08-18 DIAGNOSIS — L608 Other nail disorders: Secondary | ICD-10-CM | POA: Diagnosis not present

## 2012-08-21 DIAGNOSIS — M25569 Pain in unspecified knee: Secondary | ICD-10-CM | POA: Diagnosis not present

## 2012-08-21 DIAGNOSIS — M654 Radial styloid tenosynovitis [de Quervain]: Secondary | ICD-10-CM | POA: Diagnosis not present

## 2012-08-23 DIAGNOSIS — M654 Radial styloid tenosynovitis [de Quervain]: Secondary | ICD-10-CM | POA: Diagnosis not present

## 2012-08-23 DIAGNOSIS — M25569 Pain in unspecified knee: Secondary | ICD-10-CM | POA: Diagnosis not present

## 2012-08-24 DIAGNOSIS — Z23 Encounter for immunization: Secondary | ICD-10-CM | POA: Diagnosis not present

## 2012-08-24 DIAGNOSIS — G609 Hereditary and idiopathic neuropathy, unspecified: Secondary | ICD-10-CM | POA: Diagnosis not present

## 2012-08-24 DIAGNOSIS — D649 Anemia, unspecified: Secondary | ICD-10-CM | POA: Diagnosis not present

## 2012-08-24 DIAGNOSIS — I10 Essential (primary) hypertension: Secondary | ICD-10-CM | POA: Diagnosis not present

## 2012-08-24 DIAGNOSIS — N289 Disorder of kidney and ureter, unspecified: Secondary | ICD-10-CM | POA: Diagnosis not present

## 2012-08-24 DIAGNOSIS — E1149 Type 2 diabetes mellitus with other diabetic neurological complication: Secondary | ICD-10-CM | POA: Diagnosis not present

## 2012-08-24 DIAGNOSIS — E782 Mixed hyperlipidemia: Secondary | ICD-10-CM | POA: Diagnosis not present

## 2012-08-28 DIAGNOSIS — M654 Radial styloid tenosynovitis [de Quervain]: Secondary | ICD-10-CM | POA: Diagnosis not present

## 2012-08-28 DIAGNOSIS — M25569 Pain in unspecified knee: Secondary | ICD-10-CM | POA: Diagnosis not present

## 2012-08-31 DIAGNOSIS — M654 Radial styloid tenosynovitis [de Quervain]: Secondary | ICD-10-CM | POA: Diagnosis not present

## 2012-09-25 DIAGNOSIS — G608 Other hereditary and idiopathic neuropathies: Secondary | ICD-10-CM | POA: Diagnosis not present

## 2012-09-25 DIAGNOSIS — I10 Essential (primary) hypertension: Secondary | ICD-10-CM | POA: Diagnosis not present

## 2012-09-25 DIAGNOSIS — E7800 Pure hypercholesterolemia, unspecified: Secondary | ICD-10-CM | POA: Diagnosis not present

## 2012-10-06 DIAGNOSIS — L84 Corns and callosities: Secondary | ICD-10-CM | POA: Diagnosis not present

## 2012-10-06 DIAGNOSIS — L608 Other nail disorders: Secondary | ICD-10-CM | POA: Diagnosis not present

## 2012-10-06 DIAGNOSIS — E119 Type 2 diabetes mellitus without complications: Secondary | ICD-10-CM | POA: Diagnosis not present

## 2012-10-23 ENCOUNTER — Encounter: Payer: Medicare Other | Attending: Endocrinology | Admitting: *Deleted

## 2012-10-23 DIAGNOSIS — Z713 Dietary counseling and surveillance: Secondary | ICD-10-CM | POA: Insufficient documentation

## 2012-10-23 DIAGNOSIS — E119 Type 2 diabetes mellitus without complications: Secondary | ICD-10-CM | POA: Diagnosis not present

## 2012-10-23 DIAGNOSIS — E669 Obesity, unspecified: Secondary | ICD-10-CM | POA: Diagnosis not present

## 2012-10-23 NOTE — Progress Notes (Signed)
  Medical Nutrition Therapy:  Appt start time: 1600 end time:  1700.  Assessment:  Primary concerns today: patient here for diabetes education and help with obesity. She is wearing a Medtronic insulin pump which she states is Out of [address]. She would like more information about Omnipod and Animas pumps in addition to Medtronic for her next pump. Per the [facility] report she is using about 141 units of insulin each day and currently changes out her pump reservoir every other day. She does use the Bolus Wizard but she often omits putting in the food she is eating either because she doesn't know how to count it or she is in a hurry.  MEDICATIONS: see list  Progress Towards Goal(s):  In progress.   Nutritional Diagnosis:  NB-1.1 Food and nutrition-related knowledge deficit As related to carb counting and efficient use of insulin pump.  As evidenced by A1c of 7.6%.    Intervention:  Pump education initiated based on info obtained from [facility] reports. Plan to improve BG management and work on eating habits more at next visit. Use of Bolus Wizard reviewed from report and noted increased number of manual boluses as well as the number of over rides of the Bolus Wizard recommendation. Taught her how to split her meal bolus into a correction first and then deliver food insulin as separate bolus so max bolus of 25 units is not reached. This should help with many of her post meal high BG's. Also discussed carb counting in terms of food groups which she stated was easier for her to understand. Discussed cost of Omni pods which hold 200 units each and how many she would have to purchase on her current doses and the fact that if she chooses Barry, if she wants CGM she would have 2 devices that would not communicate with each other, unlike the Medtronic products.  Plan: Use Bolus Wizard with every meal Due to max bolus limited to 25 units, consider delivering correction bolus first then enter food for meal  bolus separately so total will be less than 25 units. Consider thinking of carb choices by food groups rather than trying to remember how many grams are in different foods. Consider contacting Medtronic for upgrade pump as other pumps your are considering don't provide features you are interested in.  Handouts given during visit include: Carb Counting and Food Label handouts Meal Plan Card  Demonstration of bolus delivery  Monitoring/Evaluation:  Dietary intake, exercise, bolus wizard, and body weight in 4 week(s).

## 2012-10-24 ENCOUNTER — Encounter: Payer: Self-pay | Admitting: *Deleted

## 2012-10-24 DIAGNOSIS — L608 Other nail disorders: Secondary | ICD-10-CM | POA: Diagnosis not present

## 2012-10-24 DIAGNOSIS — E119 Type 2 diabetes mellitus without complications: Secondary | ICD-10-CM | POA: Diagnosis not present

## 2012-10-24 NOTE — Patient Instructions (Addendum)
Plan: Use Bolus Wizard with every meal Due to max bolus limited to 25 units, consider delivering correction bolus first then enter food for meal bolus separately so total will be less than 25 units. Consider thinking of carb choices by food groups rather than trying to remember how many grams are in different foods. Consider contacting Medtronic for upgrade pump as other pumps your are considering don't provide features you are interested in.

## 2012-11-08 DIAGNOSIS — M25569 Pain in unspecified knee: Secondary | ICD-10-CM | POA: Diagnosis not present

## 2012-11-08 DIAGNOSIS — S8000XA Contusion of unspecified knee, initial encounter: Secondary | ICD-10-CM | POA: Diagnosis not present

## 2012-11-13 DIAGNOSIS — H409 Unspecified glaucoma: Secondary | ICD-10-CM | POA: Diagnosis not present

## 2012-11-13 DIAGNOSIS — H401290 Low-tension glaucoma, unspecified eye, stage unspecified: Secondary | ICD-10-CM | POA: Diagnosis not present

## 2012-11-13 DIAGNOSIS — H04129 Dry eye syndrome of unspecified lacrimal gland: Secondary | ICD-10-CM | POA: Diagnosis not present

## 2012-11-17 ENCOUNTER — Encounter: Payer: Medicare Other | Attending: Endocrinology | Admitting: *Deleted

## 2012-11-17 DIAGNOSIS — E669 Obesity, unspecified: Secondary | ICD-10-CM | POA: Insufficient documentation

## 2012-11-17 DIAGNOSIS — E119 Type 2 diabetes mellitus without complications: Secondary | ICD-10-CM | POA: Insufficient documentation

## 2012-11-17 DIAGNOSIS — Z713 Dietary counseling and surveillance: Secondary | ICD-10-CM | POA: Insufficient documentation

## 2012-11-17 NOTE — Patient Instructions (Addendum)
Plan: Continue to use Bolus Wizard with every meal Continue to deliver correction bolus first then enter food for meal bolus separately so total will be less than 25 units. Continue thinking of carb choices by food groups rather than trying to remember how many grams are in different foods. Aim for 3-4 Carb Choices per meal (45-60 grams) +/- 1 either way  Aim for 0-2 Carbs per snack if hungry  Consider reading food labels for Total Carbohydrate of foods Consider checking BG at alternate times per day as directed by MD  When your upgrade pump arrives, let me know so we can transfer to the new pump.

## 2012-11-17 NOTE — Progress Notes (Signed)
  Medical Nutrition Therapy:  Appt start time: 0915 end time:  0945.  Assessment:  Primary concerns today: patient here for diabetes education and help with obesity follow up visit. Doing better with Carb Counting and and using the Bolus Wizard with fewer overrides on her Medtronic insulin pump. She has started giving her Correction Dose first by itself and when delivery is completed she gives her meal time insulin so she doesn't go over the 25 unit Max Bolus anymore, which helps provide adequate insulin and lower her hyperglycemia. She states she is more comfortable with her carb counting decisions  Her 65 year old father is living with her and he likes to eat high carb foods which challenges her weight loss efforts.  MEDICATIONS: see list  Progress Towards Goal(s):  In progress.   Nutritional Diagnosis:  NB-1.1 Food and nutrition-related knowledge deficit As related to carb counting and efficient use of insulin pump.  As evidenced by A1c of 7.6%.    Intervention:  Downloaded her pump via [employer] and reviewed the reports with her. Complemented her on the significant improvement in her average BG from 219 +/- 73 mg/dl at first visit one month ago to 181 +/- 72 mg/dl at this visit! I explained this improvement was indicative of a drop in A1c ot 1 1/2 % in 4 weeks time with no hypoglycemia, which is marvelous! She was slow to acknowledge the progress she had made so I asked her to give herself credit for the eating habits and diabetes management steps she was taking instead of only looking at what she felt she was doing wrong. She states she is planning to upgrade to the Medtronic insulin pump in the near future.  Plan: Continue to use Bolus Wizard with every meal Continue to deliver correction bolus first then enter food for meal bolus separately so total will be less than 25 units. Continue thinking of carb choices by food groups rather than trying to remember how many grams are in different  foods. Aim for 3-4 Carb Choices per meal (45-60 grams) +/- 1 either way  Aim for 0-2 Carbs per snack if hungry  Consider reading food labels for Total Carbohydrate of foods Consider checking BG at alternate times per day as directed by MD  When your upgrade pump arrives, let me know so we can transfer to the new pump.   Handouts given during visit include:  [facility] Reports  Monitoring/Evaluation:  Dietary intake, exercise, bolus wizard, and body weight in 4 week(s).

## 2012-11-27 ENCOUNTER — Encounter: Payer: Self-pay | Admitting: *Deleted

## 2012-12-18 ENCOUNTER — Encounter: Payer: Medicare Other | Attending: Endocrinology | Admitting: *Deleted

## 2012-12-18 ENCOUNTER — Encounter: Payer: Self-pay | Admitting: *Deleted

## 2012-12-18 VITALS — Ht 59.5 in | Wt 257.1 lb

## 2012-12-18 DIAGNOSIS — Z713 Dietary counseling and surveillance: Secondary | ICD-10-CM | POA: Diagnosis not present

## 2012-12-18 DIAGNOSIS — E119 Type 2 diabetes mellitus without complications: Secondary | ICD-10-CM | POA: Insufficient documentation

## 2012-12-18 DIAGNOSIS — E669 Obesity, unspecified: Secondary | ICD-10-CM | POA: Insufficient documentation

## 2012-12-18 NOTE — Progress Notes (Signed)
  Medical Nutrition Therapy:  Appt start time: 1015 end time:  1115.  Assessment:  Primary concerns today: patient here for diabetes education and help with obesity follow up visit. She has a more positive outlook today even though a slight weight gain noted. She states she has been less active due to winter weather this past month and she has prepared her father's favorite pasta dishes more recently. She expresses motivation to resume [employer] now that the weather is better. She also states she has gotten away from putting Correction dose into the pump first and then putting in food which means she doesn't always get proper amount of insulin due Max Bolus of 25 units.  MEDICATIONS: see list  Progress Towards Goal(s):  In progress.   Nutritional Diagnosis:  NB-1.1 Food and nutrition-related knowledge deficit As related to carb counting and efficient use of insulin pump.  As evidenced by A1c of 7.6%.    Intervention:  Downloaded her pump via [employer] and reviewed the reports with her.  Has not been separating the correction from the meal insulin as often so not quite getting adequate doses again. Reports indicate average BG continues to be around 181 mg/dl as in last visit, however variability is less from 72 to 55 mg/dl as well as total carb intake and total insulin per day is reduced significantly. She plans to resume [employer] now that weather is improved and to continue with carb counting. Encouraged her to resume separating correction insulin dose from food so Max Bolus if 25 units is not a problem.  Plan: Continue to use Bolus Wizard with every meal Continue to deliver correction bolus first then enter food for meal bolus separately so total will be less than 25 units. Continue thinking of carb choices by food groups rather than trying to remember how many grams are in different foods. Continue to aim for 3-4 Carb Choices per meal (45-60 grams)   Aim for 0-2 Carbs per snack if hungry  Continue  reading food labels for Total Carbohydrate of foods Continue checking BG at alternate times per day as directed by MD  Your upgrade pump has arrived, so let's make an appoint to transfer settings to the new pump.   Handouts given during visit include:  [facility] Reports  Monitoring/Evaluation:  Dietary intake, exercise, bolus wizard, and body weight in 4 week(s).

## 2012-12-18 NOTE — Patient Instructions (Addendum)
Plan: Continue to use Bolus Wizard with every meal Continue to deliver correction bolus first then enter food for meal bolus separately so total will be less than 25 units. Continue thinking of carb choices by food groups rather than trying to remember how many grams are in different foods. Continue to aim for 3-4 Carb Choices per meal (45-60 grams)   Aim for 0-2 Carbs per snack if hungry  Continue reading food labels for Total Carbohydrate of foods Continue checking BG at alternate times per day as directed by MD  Your upgrade pump has arrived, let me know if you want to make an appoint to transfer settings to the new pump.

## 2012-12-28 DIAGNOSIS — G608 Other hereditary and idiopathic neuropathies: Secondary | ICD-10-CM | POA: Diagnosis not present

## 2012-12-28 DIAGNOSIS — L608 Other nail disorders: Secondary | ICD-10-CM | POA: Diagnosis not present

## 2012-12-28 DIAGNOSIS — L84 Corns and callosities: Secondary | ICD-10-CM | POA: Diagnosis not present

## 2012-12-28 DIAGNOSIS — E669 Obesity, unspecified: Secondary | ICD-10-CM | POA: Diagnosis not present

## 2012-12-28 DIAGNOSIS — E119 Type 2 diabetes mellitus without complications: Secondary | ICD-10-CM | POA: Diagnosis not present

## 2012-12-28 DIAGNOSIS — I10 Essential (primary) hypertension: Secondary | ICD-10-CM | POA: Diagnosis not present

## 2012-12-28 DIAGNOSIS — E7800 Pure hypercholesterolemia, unspecified: Secondary | ICD-10-CM | POA: Diagnosis not present

## 2013-01-15 ENCOUNTER — Encounter: Payer: Medicare Other | Attending: Endocrinology | Admitting: *Deleted

## 2013-01-15 ENCOUNTER — Encounter: Payer: Self-pay | Admitting: *Deleted

## 2013-01-15 DIAGNOSIS — E119 Type 2 diabetes mellitus without complications: Secondary | ICD-10-CM | POA: Insufficient documentation

## 2013-01-15 DIAGNOSIS — E669 Obesity, unspecified: Secondary | ICD-10-CM | POA: Insufficient documentation

## 2013-01-15 DIAGNOSIS — Z713 Dietary counseling and surveillance: Secondary | ICD-10-CM | POA: Insufficient documentation

## 2013-01-15 NOTE — Progress Notes (Signed)
  Medical Nutrition Therapy:  Appt start time: 1045 end time:  1215.  Assessment:  Primary concerns today: patient here for diabetes education and help with obesity follow up visit. States she has cut back on her portion sizes and is going to [employer] 2-3 times a week for a 30 minute work out. She came with her new Medtronic Revel Insulin pump for upgrade training today. Still a more positive outlook. She has resumed putting in her Correction Dose first and then adding her food bolus most of the time. She states she wants to work on weight loss more strongly at next visit.  MEDICATIONS: see list. Currently on insulin pump  Progress Towards Goal(s):  In progress.   Nutritional Diagnosis:  NB-1.1 Food and nutrition-related knowledge deficit As related to carb counting and efficient use of insulin pump.  As evidenced by A1c of 7.6%.    Intervention:  Downloaded her pump via [employer] and reviewed the reports with her.  Reports indicate average BG has increased to 213 mg/dl with variability of 58 mg/dl. Commended her on her resuming [employer] now that weather is improved and explained the importance of her increasing her activity level on a daily basis for at least 15 minutes in order to lose any weight. She states she has an [employer]/gallery [employer] Exercise DVD that she can use at home on the days she doesn't go to [employer].  Plan: Continue to use Bolus Wizard with every meal Continue to deliver correction bolus first then enter food for meal bolus separately so total will be less than 25 units. Continue thinking of carb choices by food groups rather than trying to remember how many grams are in different foods. Continue to aim for 3-4 Carb Choices per meal (45-60 grams)   Aim for 0-2 Carbs per snack if hungry  Continue reading food labels for Total Carbohydrate of foods Continue checking BG at alternate times per day as directed by MD  Consider marking your exercise times under Capture Events on your new Revel pump  so we can see them on the [facility] Reports at next visit.  Handouts given during visit include:  [facility] Reports  Monitoring/Evaluation:  Dietary intake, exercise, bolus wizard, and body weight in 4 week(s).

## 2013-01-15 NOTE — Patient Instructions (Addendum)
Plan: Continue to use Bolus Wizard with every meal Continue to deliver correction bolus first then enter food for meal bolus separately so total will be less than 25 units. Continue thinking of carb choices by food groups rather than trying to remember how many grams are in different foods. Continue to aim for 3-4 Carb Choices per meal (45-60 grams)   Aim for 0-2 Carbs per snack if hungry  Continue reading food labels for Total Carbohydrate of foods Continue checking BG at alternate times per day as directed by MD  Consider marking your exercise times under Capture Events on your new Revel pump so we can see them on the [facility] Reports at next visit.

## 2013-02-01 DIAGNOSIS — N289 Disorder of kidney and ureter, unspecified: Secondary | ICD-10-CM | POA: Diagnosis not present

## 2013-02-01 DIAGNOSIS — D649 Anemia, unspecified: Secondary | ICD-10-CM | POA: Diagnosis not present

## 2013-02-01 DIAGNOSIS — I10 Essential (primary) hypertension: Secondary | ICD-10-CM | POA: Diagnosis not present

## 2013-02-01 DIAGNOSIS — G609 Hereditary and idiopathic neuropathy, unspecified: Secondary | ICD-10-CM | POA: Diagnosis not present

## 2013-02-01 DIAGNOSIS — E782 Mixed hyperlipidemia: Secondary | ICD-10-CM | POA: Diagnosis not present

## 2013-02-01 DIAGNOSIS — Z23 Encounter for immunization: Secondary | ICD-10-CM | POA: Diagnosis not present

## 2013-02-01 DIAGNOSIS — E1149 Type 2 diabetes mellitus with other diabetic neurological complication: Secondary | ICD-10-CM | POA: Diagnosis not present

## 2013-02-06 DIAGNOSIS — E1149 Type 2 diabetes mellitus with other diabetic neurological complication: Secondary | ICD-10-CM | POA: Diagnosis not present

## 2013-02-06 DIAGNOSIS — E782 Mixed hyperlipidemia: Secondary | ICD-10-CM | POA: Diagnosis not present

## 2013-02-06 DIAGNOSIS — Z86010 Personal history of colonic polyps: Secondary | ICD-10-CM | POA: Diagnosis not present

## 2013-02-06 DIAGNOSIS — I10 Essential (primary) hypertension: Secondary | ICD-10-CM | POA: Diagnosis not present

## 2013-02-06 DIAGNOSIS — G609 Hereditary and idiopathic neuropathy, unspecified: Secondary | ICD-10-CM | POA: Diagnosis not present

## 2013-02-06 DIAGNOSIS — H409 Unspecified glaucoma: Secondary | ICD-10-CM | POA: Diagnosis not present

## 2013-02-06 DIAGNOSIS — E2839 Other primary ovarian failure: Secondary | ICD-10-CM | POA: Diagnosis not present

## 2013-02-09 DIAGNOSIS — H35379 Puckering of macula, unspecified eye: Secondary | ICD-10-CM | POA: Diagnosis not present

## 2013-02-09 DIAGNOSIS — H4011X0 Primary open-angle glaucoma, stage unspecified: Secondary | ICD-10-CM | POA: Diagnosis not present

## 2013-02-09 DIAGNOSIS — E119 Type 2 diabetes mellitus without complications: Secondary | ICD-10-CM | POA: Diagnosis not present

## 2013-02-09 DIAGNOSIS — H35039 Hypertensive retinopathy, unspecified eye: Secondary | ICD-10-CM | POA: Diagnosis not present

## 2013-02-09 DIAGNOSIS — H401290 Low-tension glaucoma, unspecified eye, stage unspecified: Secondary | ICD-10-CM | POA: Diagnosis not present

## 2013-02-09 DIAGNOSIS — H353190 Nonexudative age-related macular degeneration, unspecified eye, stage unspecified: Secondary | ICD-10-CM | POA: Diagnosis not present

## 2013-02-09 DIAGNOSIS — H2510 Age-related nuclear cataract, unspecified eye: Secondary | ICD-10-CM | POA: Diagnosis not present

## 2013-02-12 ENCOUNTER — Encounter: Payer: Self-pay | Admitting: *Deleted

## 2013-02-13 ENCOUNTER — Encounter: Payer: Self-pay | Admitting: *Deleted

## 2013-02-13 ENCOUNTER — Encounter: Payer: Medicare Other | Attending: Endocrinology | Admitting: *Deleted

## 2013-02-13 DIAGNOSIS — E669 Obesity, unspecified: Secondary | ICD-10-CM | POA: Diagnosis not present

## 2013-02-13 DIAGNOSIS — E119 Type 2 diabetes mellitus without complications: Secondary | ICD-10-CM | POA: Insufficient documentation

## 2013-02-13 DIAGNOSIS — Z713 Dietary counseling and surveillance: Secondary | ICD-10-CM | POA: Insufficient documentation

## 2013-02-13 NOTE — Patient Instructions (Addendum)
Plan: Continue to use Bolus Wizard with every meal Continue to deliver correction bolus first then enter food for meal bolus separately so total will be less than 25 units. Continue thinking of carb choices by food groups rather than trying to remember how many grams are in different foods. Continue to aim for 3 Carb Choices per meal (45 grams)   Aim for 0-2 Carbs per snack if hungry  Continue reading food labels for Total Carbohydrate of foods Continue checking BG at alternate times per day as directed by MD  Consider marking your exercise times under Capt[employer] Events on your new Revel pump so we can see them on the [facility] Reports at next visit.   Continue to exercise 15 minutes every day, either at [employer] or at home Congratulations on 6 pound weight loss in last 6 weeks!

## 2013-02-13 NOTE — Progress Notes (Signed)
  Medical Nutrition Therapy:  Appt start time: 1100 end time:  1130.  Assessment:  Primary concerns today: patient here for diabetes education and help with obesity follow up visit. Weight loss of 6.2 pounds in past 4 weeks noted! She attributes this to increasing her visits to [employer] from 2-3 times to 3-4 times per week and being active at home by walking on the days she doesn't go to [employer]. So she has made a commitment to have at least 15 minutes of activity every day! She continues with more positive attitude, stating that she "feels that the positive attitude helps her with her weight loss even more than restricting her food intake". She also states she is more careful with portion control especially when eating out, putting half the food in a separate place and choosing not to clean her plate anymore.  MEDICATIONS: see list. Currently on insulin pump  Progress Towards Goal(s):  In progress.   Nutritional Diagnosis:  NB-1.1 Food and nutrition-related knowledge deficit As related to carb counting and efficient use of insulin pump.  As evidenced by A1c of 7.6%.    Intervention:  Downloaded her pump via [employer] and reviewed the reports with her. Based on the [facility] Reports, her BG average increases consistently in the afternoon through the night, so I would like to suggest an increase in her Basal Rates by 10% from 1 PM until MN to 3.0 units per hour. I do not have permission to make pump setting changes so if Dr. Talmage Nap agrees, please call patient for direction.  Reports indicate average BG is back to 191 mg/dl with variability of 59 mg/dl. Commended her on her increased frequency of attending [employer] and that she has increasing her activity level to daily for at least 15 minutes for weight loss to occur. I also commended her for marking her exercise time on her pump under [employer], so they show up on [facility] Reports with a heart icon.  Plan: Continue to use Bolus Wizard with every  meal Continue to deliver correction bolus first then enter food for meal bolus separately so total will be less than 25 units. Continue thinking of carb choices by food groups rather than trying to remember how many grams are in different foods. Continue to aim for 3 Carb Choices per meal (45 grams)   Aim for 0-2 Carbs per snack if hungry  Continue reading food labels for Total Carbohydrate of foods Continue checking BG at alternate times per day as directed by MD  Consider marking your exercise times under Capture Events on your new Revel pump so we can see them on the [facility] Reports at next visit.   Continue to exercise 15 minutes every day, either at [employer] or at home Congratulations on 6 pound weight loss in last 6 weeks!   Handouts given during visit include:  [facility] Reports  Monitoring/Evaluation:  Dietary intake, exercise, bolus wizard, and body weight in 4 week(s).

## 2013-02-23 DIAGNOSIS — I10 Essential (primary) hypertension: Secondary | ICD-10-CM | POA: Diagnosis not present

## 2013-02-23 DIAGNOSIS — Z0389 Encounter for observation for other suspected diseases and conditions ruled out: Secondary | ICD-10-CM | POA: Diagnosis not present

## 2013-02-23 DIAGNOSIS — R0609 Other forms of dyspnea: Secondary | ICD-10-CM | POA: Diagnosis not present

## 2013-02-23 DIAGNOSIS — E782 Mixed hyperlipidemia: Secondary | ICD-10-CM | POA: Diagnosis not present

## 2013-03-05 DIAGNOSIS — R0609 Other forms of dyspnea: Secondary | ICD-10-CM | POA: Diagnosis not present

## 2013-03-05 DIAGNOSIS — Z0389 Encounter for observation for other suspected diseases and conditions ruled out: Secondary | ICD-10-CM | POA: Diagnosis not present

## 2013-03-05 DIAGNOSIS — R0989 Other specified symptoms and signs involving the circulatory and respiratory systems: Secondary | ICD-10-CM | POA: Diagnosis not present

## 2013-03-05 DIAGNOSIS — I10 Essential (primary) hypertension: Secondary | ICD-10-CM | POA: Diagnosis not present

## 2013-03-05 DIAGNOSIS — E782 Mixed hyperlipidemia: Secondary | ICD-10-CM | POA: Diagnosis not present

## 2013-03-08 DIAGNOSIS — E119 Type 2 diabetes mellitus without complications: Secondary | ICD-10-CM | POA: Diagnosis not present

## 2013-03-08 DIAGNOSIS — S93419A Sprain of calcaneofibular ligament of unspecified ankle, initial encounter: Secondary | ICD-10-CM | POA: Diagnosis not present

## 2013-03-08 DIAGNOSIS — L84 Corns and callosities: Secondary | ICD-10-CM | POA: Diagnosis not present

## 2013-03-08 DIAGNOSIS — L608 Other nail disorders: Secondary | ICD-10-CM | POA: Diagnosis not present

## 2013-03-13 ENCOUNTER — Ambulatory Visit: Payer: BC Managed Care – PPO | Admitting: *Deleted

## 2013-03-14 ENCOUNTER — Encounter: Payer: Medicare Other | Attending: Endocrinology | Admitting: *Deleted

## 2013-03-14 ENCOUNTER — Encounter: Payer: Self-pay | Admitting: *Deleted

## 2013-03-14 DIAGNOSIS — E669 Obesity, unspecified: Secondary | ICD-10-CM | POA: Diagnosis not present

## 2013-03-14 DIAGNOSIS — E119 Type 2 diabetes mellitus without complications: Secondary | ICD-10-CM | POA: Insufficient documentation

## 2013-03-14 DIAGNOSIS — Z713 Dietary counseling and surveillance: Secondary | ICD-10-CM | POA: Insufficient documentation

## 2013-03-14 NOTE — Patient Instructions (Addendum)
Plan: Continue to use Bolus Wizard with every meal Continue to aim for 3 Carb Choices per meal (45 grams)   Aim for 0-2 Carbs per snack if hungry  Continue checking BG at alternate times per day as directed by MD  Continue marking your exercise times under Capt[employer] Events on your new Revel pump so we can see them on the [facility] Reports at next visit.   Continue to exercise 15 minutes every day, either at [employer] or at home Consider using Temp Basal for exercise @ 75% for 1 hour to reduce risk of low BG. Congratulations on 10 pound weight loss in last 4 weeks!

## 2013-03-14 NOTE — Progress Notes (Signed)
  Medical Nutrition Therapy:  Appt start time: 1230 end time:  1300.  Assessment:  Primary concerns today: patient here for diabetes education and help with obesity follow up visit. Weight loss of 10 pounds noted! Has started on Lasix so some of her weight loss is water weight. Still going to [employer] 3 times a week for 30-45 minutes and is on special program using arms instead of legs while ankle is healing. On other days she goes to store to walk where she can hold onto a cart. Also continuing with appropriate portion sizes at home and eating out. She states her BG's are dropping into the 80's now when she increases her activity level and then she has to eat unwanted food to treat.  MEDICATIONS: see list. Currently on insulin pump  Progress Towards Goal(s):  In progress.   Nutritional Diagnosis:  NB-1.1 Food and nutrition-related knowledge deficit As related to carb counting and efficient use of insulin pump.  As evidenced by A1c of 7.6%.    Intervention: Again, I downloaded her pump via [facility] and reviewed the reports with her. Based on the [facility] Reports, her BG average increases consistently in the afternoon through the night, so I would still like to suggest an increase in her Basal Rates by 10% from 1 PM until MN to 3.0 units per hour. I do not have permission to make pump setting changes so they have not been adjusted since last visit with me. Patient states she will see Dr. Talmage Nap on June 13th, so adjustments can be made at that time.  Reports indicate average BG is back to 187 mg/dl with variability of 59 mg/dl. Commended her on her continued attendance at [employer] and that she has increasing her activity level to daily for at least 15 minutes for weight loss to occur. Taught her use of Temp Basal during exercise to decrease risk of hypoglycemia.  Plan: Continue to use Bolus Wizard with every meal Continue to aim for 3 Carb Choices per meal (45 grams)   Aim for 0-2 Carbs per snack if  hungry  Continue checking BG at alternate times per day as directed by MD  Continue marking your exercise times under Capture Events on your new Revel pump so we can see them on the [facility] Reports at next visit.   Continue to exercise 15 minutes every day, either at [employer] or at home Consider using Temp Basal for exercise @ 75% for 1 hour to reduce risk of low BG. Congratulations on 10 pound weight loss in last 4 weeks!    Handouts given during visit include:  [facility] Reports  Monitoring/Evaluation:  Dietary intake, exercise, bolus wizard, and body weight in 6 week(s).

## 2013-03-15 DIAGNOSIS — S93419A Sprain of calcaneofibular ligament of unspecified ankle, initial encounter: Secondary | ICD-10-CM | POA: Diagnosis not present

## 2013-03-16 DIAGNOSIS — R0609 Other forms of dyspnea: Secondary | ICD-10-CM | POA: Diagnosis not present

## 2013-03-16 DIAGNOSIS — I10 Essential (primary) hypertension: Secondary | ICD-10-CM | POA: Diagnosis not present

## 2013-03-16 DIAGNOSIS — Z79899 Other long term (current) drug therapy: Secondary | ICD-10-CM | POA: Diagnosis not present

## 2013-03-27 DIAGNOSIS — S93419A Sprain of calcaneofibular ligament of unspecified ankle, initial encounter: Secondary | ICD-10-CM | POA: Diagnosis not present

## 2013-03-29 DIAGNOSIS — Z09 Encounter for follow-up examination after completed treatment for conditions other than malignant neoplasm: Secondary | ICD-10-CM | POA: Diagnosis not present

## 2013-03-29 DIAGNOSIS — Z86010 Personal history of colonic polyps: Secondary | ICD-10-CM | POA: Diagnosis not present

## 2013-05-01 ENCOUNTER — Encounter: Payer: Medicare Other | Attending: Endocrinology | Admitting: *Deleted

## 2013-05-01 ENCOUNTER — Encounter: Payer: Self-pay | Admitting: *Deleted

## 2013-05-01 DIAGNOSIS — Z713 Dietary counseling and surveillance: Secondary | ICD-10-CM | POA: Insufficient documentation

## 2013-05-01 DIAGNOSIS — E119 Type 2 diabetes mellitus without complications: Secondary | ICD-10-CM | POA: Diagnosis not present

## 2013-05-01 DIAGNOSIS — E669 Obesity, unspecified: Secondary | ICD-10-CM | POA: Diagnosis not present

## 2013-05-01 NOTE — Patient Instructions (Signed)
Plan: Continue to use Bolus Wizard with every meal Continue to aim for 3 Carb Choices per meal (45 grams)   Aim for 0-2 Carbs per snack if hungry  Continue checking BG at alternate times per day as directed by MD  Continue marking your exercise times under Capture Events on your new Revel pump so we can see them on the [facility] Reports at next visit.   Continue to exercise 15 minutes every day, either at [employer] or at home Consider using Temp Basal for exercise @ 75% for 1 hour to reduce risk of low BG.

## 2013-05-01 NOTE — Progress Notes (Signed)
  Medical Nutrition Therapy:  Appt start time: 0945 end time:  1015.  Assessment:  Primary concerns today: patient here for diabetes education and help with obesity follow up visit. States her sister is visiting which she is pleased to have her here and enjoys the time with their Dad. Has resumed [employer] some and has resumed her normal routine since her ankle is better. She was on a trip in the meantime and has not gone back to [employer] in past week. She continues  to walk where she can hold onto a cart. Also continuing with appropriate portion sizes at home and eating out.  She reports no episodes of hypoglycemia below 87. Dr. Talmage Nap increased her overnight Basal Rates at last visit in June.   MEDICATIONS: see list. Currently on insulin pump  Progress Towards Goal(s):  In progress.   Nutritional Diagnosis:  NB-1.1 Food and nutrition-related knowledge deficit As related to carb counting and efficient use of insulin pump.  As evidenced by A1c of 7.6%, now down to 7.3% in June, 2014.    Intervention: I downloaded her pump via [facility] and reviewed the reports with her. Her BGs are much more stable, less variation overall. Encouraged her to resume [facility] and to continue with portion control. Offered her Nutrition in the [employer] booklet for better carb counting info.  Plan: Continue to use Bolus Wizard with every meal Continue to aim for 3 Carb Choices per meal (45 grams)   Aim for 0-2 Carbs per snack if hungry  Continue checking BG at alternate times per day as directed by MD  Continue marking your exercise times under Capture Events on your new Revel pump so we can see them on the [facility] Reports at next visit.   Continue to exercise 15 minutes every day, either at [employer] or at home Consider using Temp Basal for exercise @ 75% for 1 hour to reduce risk of low BG.     Handouts given during visit include:  [facility] Reports  Monitoring/Evaluation:  Dietary intake, exercise, bolus  wizard, and body weight in 6 week(s).

## 2013-05-07 DIAGNOSIS — I10 Essential (primary) hypertension: Secondary | ICD-10-CM | POA: Diagnosis not present

## 2013-05-07 DIAGNOSIS — I519 Heart disease, unspecified: Secondary | ICD-10-CM | POA: Diagnosis not present

## 2013-05-15 DIAGNOSIS — L608 Other nail disorders: Secondary | ICD-10-CM | POA: Diagnosis not present

## 2013-05-15 DIAGNOSIS — E119 Type 2 diabetes mellitus without complications: Secondary | ICD-10-CM | POA: Diagnosis not present

## 2013-06-12 ENCOUNTER — Encounter: Payer: Self-pay | Admitting: *Deleted

## 2013-06-12 ENCOUNTER — Encounter: Payer: Medicare Other | Attending: Endocrinology | Admitting: *Deleted

## 2013-06-12 VITALS — Ht 59.5 in | Wt 252.0 lb

## 2013-06-12 DIAGNOSIS — E669 Obesity, unspecified: Secondary | ICD-10-CM | POA: Insufficient documentation

## 2013-06-12 DIAGNOSIS — E119 Type 2 diabetes mellitus without complications: Secondary | ICD-10-CM | POA: Diagnosis not present

## 2013-06-12 DIAGNOSIS — Z713 Dietary counseling and surveillance: Secondary | ICD-10-CM | POA: Insufficient documentation

## 2013-06-12 NOTE — Patient Instructions (Addendum)
Plan: Continue to use Bolus Wizard with every meal Continue to aim for 3 Carb Choices per meal (45 grams)   Consider aiming for 3 Fat Servings per meal ( 15 grams)  Aim for 0-2 Carbs per snack if hungry  Consider using Menu Planner to record your food intake and increase your awareness of your choices Continue checking BG at alternate times per day as directed by MD  Continue to exercise 15 minutes every day, either at [employer] or at home

## 2013-06-12 NOTE — Progress Notes (Signed)
  Medical Nutrition Therapy:  Appt start time: 1030 end time:  1100.  Assessment:  Primary concerns today: patient here for diabetes education and help with obesity follow up visit. Continues to go to [employer] 3 days a week except this past week she didn't feel well so skipped. Today she is concerned about being started on Amlodipine and has had diarrhea and bloating of left ankle. She would like to discontinue it but agrees to check with MD first. She is reading about the [facility] as a method of food control. She is checking her BP and BG daily as suggested in this book. States she is having 1/2 can baked beans and chocolate milk for breakfast,   MEDICATIONS: see list. Currently on insulin pump  Progress Towards Goal(s):  In progress.   Nutritional Diagnosis:  NB-1.1 Food and nutrition-related knowledge deficit As related to carb counting and efficient use of insulin pump.  As evidenced by A1c of 7.6%, now down to 7.3% in June, 2014.    Intervention: Offered review of carb counting and the concept of variety of all food groups during the day. Reviewed Menu Planner that includes all food groups and way to distribute them throughout the day. Suggested she use that to plan out meals and grocery shopping list.   Plan: Continue to use Bolus Wizard with every meal Continue to aim for 3 Carb Choices per meal (45 grams)   Consider aiming for 3 Fat Servings per meal ( 15 grams)  Aim for 0-2 Carbs per snack if hungry  Consider using Menu Planner to record your food intake and increase your awareness of your choices Continue checking BG at alternate times per day as directed by MD  Continue to exercise 15 minutes every day, either at [employer] or at home     Handouts given during visit include:  Menu Planner Sheet  Monitoring/Evaluation:  Dietary intake, exercise, bolus wizard, and body weight in 6 week(s).

## 2013-07-02 DIAGNOSIS — I519 Heart disease, unspecified: Secondary | ICD-10-CM | POA: Diagnosis not present

## 2013-07-02 DIAGNOSIS — E1149 Type 2 diabetes mellitus with other diabetic neurological complication: Secondary | ICD-10-CM | POA: Diagnosis not present

## 2013-07-02 DIAGNOSIS — I10 Essential (primary) hypertension: Secondary | ICD-10-CM | POA: Diagnosis not present

## 2013-07-02 DIAGNOSIS — E782 Mixed hyperlipidemia: Secondary | ICD-10-CM | POA: Diagnosis not present

## 2013-07-17 DIAGNOSIS — E119 Type 2 diabetes mellitus without complications: Secondary | ICD-10-CM | POA: Diagnosis not present

## 2013-07-17 DIAGNOSIS — L608 Other nail disorders: Secondary | ICD-10-CM | POA: Diagnosis not present

## 2013-07-19 DIAGNOSIS — E782 Mixed hyperlipidemia: Secondary | ICD-10-CM | POA: Diagnosis not present

## 2013-07-19 DIAGNOSIS — D649 Anemia, unspecified: Secondary | ICD-10-CM | POA: Diagnosis not present

## 2013-07-19 DIAGNOSIS — I10 Essential (primary) hypertension: Secondary | ICD-10-CM | POA: Diagnosis not present

## 2013-07-19 DIAGNOSIS — E559 Vitamin D deficiency, unspecified: Secondary | ICD-10-CM | POA: Diagnosis not present

## 2013-07-24 ENCOUNTER — Encounter: Payer: Medicare Other | Attending: Endocrinology | Admitting: *Deleted

## 2013-07-24 ENCOUNTER — Encounter: Payer: Self-pay | Admitting: *Deleted

## 2013-07-24 VITALS — Ht 59.5 in | Wt 247.9 lb

## 2013-07-24 DIAGNOSIS — E119 Type 2 diabetes mellitus without complications: Secondary | ICD-10-CM | POA: Diagnosis not present

## 2013-07-24 DIAGNOSIS — Z713 Dietary counseling and surveillance: Secondary | ICD-10-CM | POA: Insufficient documentation

## 2013-07-24 DIAGNOSIS — E669 Obesity, unspecified: Secondary | ICD-10-CM | POA: Insufficient documentation

## 2013-07-24 NOTE — Progress Notes (Signed)
  Medical Nutrition Therapy:  Appt start time: 1000 end time:  1030.  Assessment:  Primary concerns today: patient here for diabetes education and help with obesity follow up visit. States she saw Dr. Talmage Nap 2 weeks ago and A1c is down to 7.0%. She states her BP is down and she is happy with 4 pound weight loss. Due to medication changes she is not able to go to [employer] regularly due to bladder leaking with exercise. She did record her food intake in the [facility] Journal book until September 1st. She continues to care for her elderly father including eating out regularly.  MEDICATIONS: see list. Currently on insulin pump  Progress Towards Goal(s):  In progress.   Nutritional Diagnosis:  NB-1.1 Food and nutrition-related knowledge deficit As related to carb counting and efficient use of insulin pump.  As evidenced by A1c of 7.6%, now down to 7.0% in Sept., 2014.    Intervention: Commended her on her multiple successes. Encouraged further journalling to help with food intake accountability. Also encouraged ongoing increase in activity level as tolerated, even in 5 minute increments several times each day.  Plan: Continue to use Bolus Wizard with every meal Continue to aim for 3 Carb Choices per meal (45 grams)   Consider aiming for 3 Fat Servings per meal ( 15 grams)  Aim for 0-2 Carbs per snack if hungry  Consider using Menu Planner to record your food intake and increase your awareness of your choices Continue checking BG at alternate times per day as directed by MD  Continue to exercise 15 minutes every day, either at [employer] or at home     Handouts given during visit include:  Menu Planner Sheet  Monitoring/Evaluation:  Dietary intake, exercise, bolus wizard, and body weight in 6 week(s).

## 2013-07-24 NOTE — Patient Instructions (Addendum)
Plan: Continue to use Bolus Wizard with every meal Continue to aim for 3 Carb Choices per meal (45 grams)   Consider aiming for 3 Fat Servings per meal ( 15 grams)  Aim for 0-2 Carbs per snack if hungry  Consider using Menu Planner to record your food intake and increase your awareness of your choices Continue checking BG at alternate times per day as directed by MD  Continue to exercise 15 minutes every day, either at [employer] or at home Congratulations on 4 pound weight loss!

## 2013-08-09 DIAGNOSIS — D649 Anemia, unspecified: Secondary | ICD-10-CM | POA: Diagnosis not present

## 2013-08-09 DIAGNOSIS — Z1331 Encounter for screening for depression: Secondary | ICD-10-CM | POA: Diagnosis not present

## 2013-08-09 DIAGNOSIS — E2839 Other primary ovarian failure: Secondary | ICD-10-CM | POA: Diagnosis not present

## 2013-08-09 DIAGNOSIS — Z1239 Encounter for other screening for malignant neoplasm of breast: Secondary | ICD-10-CM | POA: Diagnosis not present

## 2013-08-09 DIAGNOSIS — E1149 Type 2 diabetes mellitus with other diabetic neurological complication: Secondary | ICD-10-CM | POA: Diagnosis not present

## 2013-08-09 DIAGNOSIS — E782 Mixed hyperlipidemia: Secondary | ICD-10-CM | POA: Diagnosis not present

## 2013-08-09 DIAGNOSIS — I10 Essential (primary) hypertension: Secondary | ICD-10-CM | POA: Diagnosis not present

## 2013-08-09 DIAGNOSIS — E559 Vitamin D deficiency, unspecified: Secondary | ICD-10-CM | POA: Diagnosis not present

## 2013-08-23 DIAGNOSIS — Z1231 Encounter for screening mammogram for malignant neoplasm of breast: Secondary | ICD-10-CM | POA: Diagnosis not present

## 2013-09-01 ENCOUNTER — Encounter: Payer: Self-pay | Admitting: *Deleted

## 2013-09-01 ENCOUNTER — Encounter: Payer: Self-pay | Admitting: Cardiology

## 2013-09-04 ENCOUNTER — Encounter: Payer: Self-pay | Admitting: *Deleted

## 2013-09-04 ENCOUNTER — Encounter: Payer: Medicare Other | Attending: Endocrinology | Admitting: *Deleted

## 2013-09-04 ENCOUNTER — Encounter: Payer: Self-pay | Admitting: Cardiology

## 2013-09-04 ENCOUNTER — Ambulatory Visit (INDEPENDENT_AMBULATORY_CARE_PROVIDER_SITE_OTHER): Payer: Medicare Other | Admitting: Cardiology

## 2013-09-04 VITALS — Ht 59.5 in | Wt 251.9 lb

## 2013-09-04 VITALS — BP 142/77 | HR 82 | Ht 59.5 in | Wt 252.0 lb

## 2013-09-04 DIAGNOSIS — E119 Type 2 diabetes mellitus without complications: Secondary | ICD-10-CM | POA: Insufficient documentation

## 2013-09-04 DIAGNOSIS — I10 Essential (primary) hypertension: Secondary | ICD-10-CM

## 2013-09-04 DIAGNOSIS — E669 Obesity, unspecified: Secondary | ICD-10-CM | POA: Insufficient documentation

## 2013-09-04 DIAGNOSIS — Z713 Dietary counseling and surveillance: Secondary | ICD-10-CM | POA: Insufficient documentation

## 2013-09-04 DIAGNOSIS — E875 Hyperkalemia: Secondary | ICD-10-CM | POA: Diagnosis not present

## 2013-09-04 LAB — BASIC METABOLIC PANEL
BUN: 20 mg/dL (ref 6–23)
CO2: 25 mEq/L (ref 19–32)
Calcium: 9.7 mg/dL (ref 8.4–10.5)
Chloride: 98 mEq/L (ref 96–112)
Creatinine, Ser: 0.8 mg/dL (ref 0.4–1.2)

## 2013-09-04 MED ORDER — SPIRONOLACTONE 25 MG PO TABS: 25.0000 mg | ORAL_TABLET | Freq: Every day | ORAL | Status: DC

## 2013-09-04 NOTE — Progress Notes (Signed)
[address] [place], [place]  [address] Phone: [phone] Fax:  [phone]  Date:  09/04/2013   ID:  Jessica, [name] 08-Nov-1945, MRN [id number]  PCP:  Gweneth Dimitri, MD   History of Present Illness: Jessica [name] is a 67 y.o. female with hx of obesity, hypertension, and hyperlipidemia. 02/23/13 here due to shortness of breath.. In 2010, cardiac catheterization showed no evidence of coronary artery disease.   Echocardiogram was performed EF 65-70%, no WMA, but increase LA pressures. Previously increased her Metoprolol to 100 mg BID from 75 mg BID due to BP 166/70. She states she has been urinating alot even though weight does not reflect decrease. She feels her SOB has improved considerably and she was not SOB walking into office today. Her BP is better. She is trying to lose weight, has been to weight loss educaton at [facility] and a memeber of [employer]. She uses CPAP nightly.   Once again, left flank pain is likely musculoskeletal especially with the exercises she is doing at [facility]. Continue to encourage.  Wt Readings from Last 3 Encounters:  09/04/13 252 lb (114.306 kg)  07/24/13 247 lb 14.4 oz (112.447 kg)  06/12/13 252 lb (114.306 kg)     Past Medical History  Diagnosis Date  . Hypertension   . Hyperlipidemia   . Obesity   . Sleep apnea   . Diabetes mellitus without complication   . Diastolic dysfunction     Past Surgical History  Procedure Laterality Date  . Cesarean section    . Tonsillectomy and adenoidectomy    . Shoulder spurs removed    . Foot spurs removed    . Knee spurs removed    . Joint replacement    . Cardiac catheterization      -6/09-normal ejection fraction 65%, no angiographically significant CAD., M. Sabree Nuon MD    Current Outpatient Prescriptions  Medication Sig Dispense Refill  . aspirin 81 MG tablet Take 81 mg by mouth daily.      . B-D INS SYRINGE 0.5CC/31GX5/16 31G X 5/16" 0.5 ML MISC       . beta carotene  w/minerals (OCUVITE) tablet Take 1 tablet by mouth daily.      . calcium carbonate (OS-CAL) 600 MG TABS Take 600 mg by mouth 2 (two) times daily with a meal.      . cephALEXin (KEFLEX) 500 MG capsule Take 500 mg by mouth as needed.      . cholecalciferol (VITAMIN D) 1000 UNITS tablet Take 1,000 Units by mouth daily. Take 2,000 daily      . Coenzyme Q10 (CO Q-10) 100 MG CAPS Take by mouth.      . dorzolamide (TRUSOPT) 2 % ophthalmic solution 1 drop 3 (three) times daily.      . furosemide (LASIX) 40 MG tablet Take 40 mg by mouth daily.      . Garlic 1000 MG CAPS Take by mouth.      . insulin lispro (HUMALOG) 100 UNIT/ML injection Inject into the skin 3 (three) times daily before meals.      . Iron-Vitamin C (VITRON-C) 65-125 MG TABS Take by mouth.      . losartan (COZAAR) 100 MG tablet Take 100 mg by mouth daily.      . metoprolol (LOPRESSOR) 50 MG tablet Take 100 mg by mouth 2 (two) times daily.       . Multiple Vitamins-Minerals (CENTRUM SILVER PO) Take by mouth.      Marland Kitchen  simvastatin (ZOCOR) 40 MG tablet Take 40 mg by mouth every evening.      Marland Kitchen spironolactone (ALDACTONE) 25 MG tablet Take 25 mg by mouth daily.      . Travoprost, BAK Free, (TRAVATAN) 0.004 % SOLN ophthalmic solution 1 drop at bedtime.       No current facility-administered medications for this visit.    Allergies:    Allergies  Allergen Reactions  . Codeine   . Iodine     Social History:  The patient  reports that she has never smoked. She has never used smokeless tobacco. She reports that she does not drink alcohol.   ROS:  Please see the history of present illness.   Denies any fevers, chills, orthopnea, PND. Cough when leaning over. Or getting up in am. Left under breast left flank soreness.   PHYSICAL EXAM: VS:  BP 142/77  Pulse 82  Ht 4' 11.5" (1.511 m)  Wt 252 lb (114.306 kg)  BMI 50.07 kg/m2 Well nourished, well developed, in no acute distress HEENT: normal Neck: no JVD Cardiac:  normal S1, S2; RRR; no  murmur Lungs:  clear to auscultation bilaterally, no wheezing, rhonchi or rales Abd: soft, nontender, no hepatomegalyObese Ext: ankle edema and chronic leg edema.  Skin: warm and dry Neuro: no focal abnormalities noted       ASSESSMENT AND PLAN:  1. Morbid obesity-once again encourage weight loss. Low carbohydrate diet. 2. Hypertension-improved control. Multidrug regimen reviewed. 3. Prior hyperkalemia-will check blood work. On Aldactone. She seems to be tolerating this very well. 4. Diabetes-per primary team. 5. Hyperlipidemia-continue with simvastatin especially with diabetes. Excellent. She has not fasted today. Dr. Uvaldo Rising has been following. Last profile in April of 2014 reviewed, hypertriglyceridemia noted. 6. We will see back in 6 months.  Signed, Donato Schultz, MD [facility]  09/04/2013 9:57 AM

## 2013-09-04 NOTE — Patient Instructions (Signed)
Plan: Continue to use Bolus Wizard with every meal Continue to aim for 3 Carb Choices per meal (45 grams)   Consider aiming for 3 Fat Servings per meal ( 15 grams)  Aim for 0-2 Carbs per snack if hungry  Continue checking BG at alternate times per day as directed by MD  Continue to exercise 15 minutes every day, either at [employer] or at home Congratulations on 4 pound weight loss!

## 2013-09-04 NOTE — Patient Instructions (Signed)
Your physician recommends that you have  lab work today BMET   Your physician recommends that you continue on your current medications as directed. Please refer to the Current Medication list given to you today.  Your physician wants you to follow-up in: 6 months with Dr. Algis Liming will receive a reminder letter in the mail two months in advance. If you don't receive a letter, please call our office to schedule the follow-up appointment.

## 2013-09-04 NOTE — Progress Notes (Signed)
  Medical Nutrition Therapy:  Appt start time: 1230 end time:  1300.  Assessment:  Primary concerns today: patient here for diabetes education and help with obesity follow up visit. She states medically she is doing better with a good report from her primary care MD earlier today. She has gone back to [employer] 3 times a week the past 3 weeks. She is also walking more at the grocery store and other life activities. She does complain of some mid torso pain that she thinks may have sore muscles from the exercise at [employer].   MEDICATIONS: see list. Currently on insulin pump  Progress Towards Goal(s):  In progress.   Nutritional Diagnosis:  NB-1.1 Food and nutrition-related knowledge deficit As related to carb counting and efficient use of insulin pump.  As evidenced by A1c of 7.6%, now down to 7.0% in Sept., 2014.    Intervention: Commended her on going back to [employer].. Encouraged further journalling to help with food intake accountability. Also encouraged ongoing increase in activity level as tolerated, even in 5 minute increments several times each day.  Plan: Continue to use Bolus Wizard with every meal Continue to aim for 3 Carb Choices per meal (45 grams)   Consider aiming for 3 Fat Servings per meal ( 15 grams)  Aim for 0-2 Carbs per snack if hungry  Continue checking BG at alternate times per day as directed by MD  Continue to exercise 15 minutes every day, either at [facility] or at home     Handouts given during visit include:  Monitoring/Evaluation:  Dietary intake, exercise, bolus wizard, and body weight in 6 week(s).

## 2013-09-05 ENCOUNTER — Encounter: Payer: Self-pay | Admitting: *Deleted

## 2013-09-28 DIAGNOSIS — E119 Type 2 diabetes mellitus without complications: Secondary | ICD-10-CM | POA: Diagnosis not present

## 2013-09-28 DIAGNOSIS — L608 Other nail disorders: Secondary | ICD-10-CM | POA: Diagnosis not present

## 2013-10-30 DIAGNOSIS — IMO0001 Reserved for inherently not codable concepts without codable children: Secondary | ICD-10-CM | POA: Diagnosis not present

## 2013-11-12 DIAGNOSIS — H401290 Low-tension glaucoma, unspecified eye, stage unspecified: Secondary | ICD-10-CM | POA: Diagnosis not present

## 2013-11-12 DIAGNOSIS — H04129 Dry eye syndrome of unspecified lacrimal gland: Secondary | ICD-10-CM | POA: Diagnosis not present

## 2013-11-12 DIAGNOSIS — H409 Unspecified glaucoma: Secondary | ICD-10-CM | POA: Diagnosis not present

## 2013-11-15 DIAGNOSIS — Z780 Asymptomatic menopausal state: Secondary | ICD-10-CM | POA: Diagnosis not present

## 2013-11-30 DIAGNOSIS — L608 Other nail disorders: Secondary | ICD-10-CM | POA: Diagnosis not present

## 2013-11-30 DIAGNOSIS — E119 Type 2 diabetes mellitus without complications: Secondary | ICD-10-CM | POA: Diagnosis not present

## 2013-12-05 ENCOUNTER — Encounter: Payer: Self-pay | Admitting: *Deleted

## 2013-12-05 ENCOUNTER — Encounter: Payer: Medicare Other | Attending: Endocrinology | Admitting: *Deleted

## 2013-12-05 VITALS — Ht 59.5 in | Wt 251.0 lb

## 2013-12-05 DIAGNOSIS — Z713 Dietary counseling and surveillance: Secondary | ICD-10-CM | POA: Diagnosis not present

## 2013-12-05 DIAGNOSIS — E119 Type 2 diabetes mellitus without complications: Secondary | ICD-10-CM

## 2013-12-05 DIAGNOSIS — E669 Obesity, unspecified: Secondary | ICD-10-CM | POA: Insufficient documentation

## 2013-12-05 NOTE — Progress Notes (Signed)
  Medical Nutrition Therapy:  Appt start time: 0900 end time:  0930.  Assessment:  Primary concerns today: patient here for diabetes education and help with obesity follow up visit. Weight stable at 251 pounds today. She states she is now assisting with the care of a 66 year old neighbor in addition to her own father, so she is paying less attention to her food choices and is no longer going to [facility]. She also states she hits her Max Bolus of 25 units often so is not getting the full amount of her bolus that she needs especially if her BG is high and the correction dose is omitted or reduced. She appears disappointed in herself, and states she is tired.   MEDICATIONS: see list. Currently on insulin pump  Progress Towards Goal(s):  In progress.   Nutritional Diagnosis:  NB-1.1 Food and nutrition-related knowledge deficit As related to carb counting and efficient use of insulin pump.  As evidenced by A1c of 7.6%, now down to 7.0% in Sept., 2014. Current A1c not available but based on her reported BG records, it will probably be higher at next visit.    Intervention: Encouraged her to take her Bolus in 2 parts, to give the Correction Dose first based on checking her BG prior to each meal, and once that is delivered, then put in her Carbohydrate grams to get her meal time insulin second. That way she will not reach the Max Bolus of 25 units and the Active Insulin is not subtracted from the Meal Time insulin and she will get a more appropriate dose to manage her BG better. She does not appear to be willing to make any food choice changes at this visit. I suggested that bringing her BG down with improve her feelings of tiredness and that she needs to care for herself so she can continue to care for her family and friend.  Plan: Continue to use Bolus Wizard with every meal Consider checking BG before the meal and giving Correction Bolus first, then entering food for Meal Bolus after Correction has been  delivered. Continue to aim for 3 Carb Choices per meal (45 grams)   Consider aiming for 3 Fat Servings per meal ( 15 grams)  Aim for 0-2 Carbs per snack if hungry  Consider exercising 15 minutes every day, either at [facility] or at home  Handouts given during visit include: written handout explaining rationale of giving Correction Bolus first and then Meal Bolus so Max Bolus is not reached and so high BGs can be corrected more quickly.  Monitoring/Evaluation:  Dietary intake, exercise, bolus wizard, and body weight in 3 months.

## 2013-12-05 NOTE — Patient Instructions (Signed)
Plan: Continue to use Bolus Wizard with every meal Consider checking BG before the meal and giving Correction Bolus first, then entering food for Meal Bolus after Correction has been delivered. Continue to aim for 3 Carb Choices per meal (45 grams)   Consider aiming for 3 Fat Servings per meal ( 15 grams)  Aim for 0-2 Carbs per snack if hungry  Consider exercising 15 minutes every day, either at [employer] or at home

## 2014-02-01 DIAGNOSIS — E119 Type 2 diabetes mellitus without complications: Secondary | ICD-10-CM | POA: Diagnosis not present

## 2014-02-01 DIAGNOSIS — L84 Corns and callosities: Secondary | ICD-10-CM | POA: Diagnosis not present

## 2014-02-01 DIAGNOSIS — L608 Other nail disorders: Secondary | ICD-10-CM | POA: Diagnosis not present

## 2014-02-08 DIAGNOSIS — E1149 Type 2 diabetes mellitus with other diabetic neurological complication: Secondary | ICD-10-CM | POA: Diagnosis not present

## 2014-02-08 DIAGNOSIS — E559 Vitamin D deficiency, unspecified: Secondary | ICD-10-CM | POA: Diagnosis not present

## 2014-02-08 DIAGNOSIS — I10 Essential (primary) hypertension: Secondary | ICD-10-CM | POA: Diagnosis not present

## 2014-02-08 DIAGNOSIS — E782 Mixed hyperlipidemia: Secondary | ICD-10-CM | POA: Diagnosis not present

## 2014-02-08 DIAGNOSIS — E2839 Other primary ovarian failure: Secondary | ICD-10-CM | POA: Diagnosis not present

## 2014-02-08 DIAGNOSIS — Z1239 Encounter for other screening for malignant neoplasm of breast: Secondary | ICD-10-CM | POA: Diagnosis not present

## 2014-02-08 DIAGNOSIS — D649 Anemia, unspecified: Secondary | ICD-10-CM | POA: Diagnosis not present

## 2014-02-11 DIAGNOSIS — IMO0001 Reserved for inherently not codable concepts without codable children: Secondary | ICD-10-CM | POA: Diagnosis not present

## 2014-02-11 DIAGNOSIS — H524 Presbyopia: Secondary | ICD-10-CM | POA: Diagnosis not present

## 2014-02-11 DIAGNOSIS — H353190 Nonexudative age-related macular degeneration, unspecified eye, stage unspecified: Secondary | ICD-10-CM | POA: Diagnosis not present

## 2014-02-11 DIAGNOSIS — I739 Peripheral vascular disease, unspecified: Secondary | ICD-10-CM | POA: Diagnosis not present

## 2014-02-11 DIAGNOSIS — H409 Unspecified glaucoma: Secondary | ICD-10-CM | POA: Diagnosis not present

## 2014-02-11 DIAGNOSIS — H401290 Low-tension glaucoma, unspecified eye, stage unspecified: Secondary | ICD-10-CM | POA: Diagnosis not present

## 2014-02-14 DIAGNOSIS — Z9181 History of falling: Secondary | ICD-10-CM | POA: Diagnosis not present

## 2014-02-14 DIAGNOSIS — Z0000 Encounter for general adult medical examination without abnormal findings: Secondary | ICD-10-CM | POA: Diagnosis not present

## 2014-02-14 DIAGNOSIS — I10 Essential (primary) hypertension: Secondary | ICD-10-CM | POA: Diagnosis not present

## 2014-02-14 DIAGNOSIS — E1149 Type 2 diabetes mellitus with other diabetic neurological complication: Secondary | ICD-10-CM | POA: Diagnosis not present

## 2014-02-14 DIAGNOSIS — Z23 Encounter for immunization: Secondary | ICD-10-CM | POA: Diagnosis not present

## 2014-02-14 DIAGNOSIS — Z1331 Encounter for screening for depression: Secondary | ICD-10-CM | POA: Diagnosis not present

## 2014-02-14 DIAGNOSIS — G609 Hereditary and idiopathic neuropathy, unspecified: Secondary | ICD-10-CM | POA: Diagnosis not present

## 2014-02-18 DIAGNOSIS — I10 Essential (primary) hypertension: Secondary | ICD-10-CM | POA: Diagnosis not present

## 2014-02-18 DIAGNOSIS — IMO0001 Reserved for inherently not codable concepts without codable children: Secondary | ICD-10-CM | POA: Diagnosis not present

## 2014-02-22 ENCOUNTER — Ambulatory Visit: Payer: Medicare Other | Attending: Family Medicine | Admitting: Physical Therapy

## 2014-02-22 DIAGNOSIS — IMO0001 Reserved for inherently not codable concepts without codable children: Secondary | ICD-10-CM | POA: Diagnosis not present

## 2014-02-22 DIAGNOSIS — R262 Difficulty in walking, not elsewhere classified: Secondary | ICD-10-CM | POA: Insufficient documentation

## 2014-02-22 DIAGNOSIS — M6281 Muscle weakness (generalized): Secondary | ICD-10-CM | POA: Diagnosis not present

## 2014-02-22 DIAGNOSIS — M25519 Pain in unspecified shoulder: Secondary | ICD-10-CM | POA: Diagnosis not present

## 2014-03-04 ENCOUNTER — Ambulatory Visit: Payer: Medicare Other | Admitting: Physical Therapy

## 2014-03-06 ENCOUNTER — Ambulatory Visit (INDEPENDENT_AMBULATORY_CARE_PROVIDER_SITE_OTHER): Payer: Medicare Other | Admitting: Cardiology

## 2014-03-06 ENCOUNTER — Encounter: Payer: Self-pay | Admitting: Cardiology

## 2014-03-06 DIAGNOSIS — E119 Type 2 diabetes mellitus without complications: Secondary | ICD-10-CM | POA: Insufficient documentation

## 2014-03-06 DIAGNOSIS — I10 Essential (primary) hypertension: Secondary | ICD-10-CM | POA: Diagnosis not present

## 2014-03-06 DIAGNOSIS — R42 Dizziness and giddiness: Secondary | ICD-10-CM

## 2014-03-06 DIAGNOSIS — E7800 Pure hypercholesterolemia, unspecified: Secondary | ICD-10-CM | POA: Insufficient documentation

## 2014-03-06 DIAGNOSIS — E1165 Type 2 diabetes mellitus with hyperglycemia: Secondary | ICD-10-CM

## 2014-03-06 DIAGNOSIS — IMO0001 Reserved for inherently not codable concepts without codable children: Secondary | ICD-10-CM

## 2014-03-06 NOTE — Patient Instructions (Signed)
Your physician recommends that you continue on your current medications as directed. Please refer to the Current Medication list given to you today.  Your physician wants you to follow-up in: 6 months with Dr. Skains. You will receive a reminder letter in the mail two months in advance. If you don't receive a letter, please call our office to schedule the follow-up appointment.  
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
## 2014-03-07 ENCOUNTER — Encounter: Payer: Medicare Other | Attending: Endocrinology | Admitting: *Deleted

## 2014-03-07 ENCOUNTER — Encounter: Payer: Self-pay | Admitting: *Deleted

## 2014-03-07 VITALS — Ht 59.5 in | Wt 254.3 lb

## 2014-03-07 DIAGNOSIS — E669 Obesity, unspecified: Secondary | ICD-10-CM | POA: Insufficient documentation

## 2014-03-07 DIAGNOSIS — Z713 Dietary counseling and surveillance: Secondary | ICD-10-CM | POA: Insufficient documentation

## 2014-03-07 DIAGNOSIS — IMO0001 Reserved for inherently not codable concepts without codable children: Secondary | ICD-10-CM

## 2014-03-07 DIAGNOSIS — E119 Type 2 diabetes mellitus without complications: Secondary | ICD-10-CM | POA: Diagnosis not present

## 2014-03-07 DIAGNOSIS — E1165 Type 2 diabetes mellitus with hyperglycemia: Secondary | ICD-10-CM

## 2014-03-07 NOTE — Progress Notes (Signed)
  Medical Nutrition Therapy:  Appt start time: 1000 end time:  1030.  Assessment:  Primary concerns today 03/07/2014: patient here for diabetes education and help with obesity follow up visit. Stated A1c is now up to  8.6%. So she is cooking again in an effort to consume healthier foods. 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  She is now bolusing her correction dose first and then putting in the food so she gets a more accurate delivery of her meal time insulin. She is interested in uploading her pump again so we can see her patterns. Weight stable at 254.3 pounds today. She just got back from the beach last week.    MEDICATIONS: see list. Currently on insulin pump  Progress Towards Goal(s):  In progress.   Nutritional Diagnosis:  NB-1.1 Food and nutrition-related knowledge deficit As related to carb counting and efficient use of insulin pump.  States her last A1c at MD office is up to 8.6%    Intervention:    [facility] Reports indicate her 2 week average BG is 226 +/- 56 mg/dl with no hypoglycemia. Total Daily Dose of insulin is only 40% from Basal insulin. Would recommend an increase in Basal Rates by 10% if MD agrees. Would also like to suggest U-500 insulin to perhaps better meet her insulin requirements.   Plan: Continue to use Bolus Wizard with every meal Continue checking BG before the meal and giving Correction Bolus first, then entering food for Meal Bolus after Correction has been delivered. Continue to aim for 3 Carb Choices per meal (45 grams)   Consider aiming for 3 Fat Servings per meal ( 15 grams)  Aim for 0-2 Carbs per snack if hungry  Consider exercising 15 minutes every day,with Arm Chair Exercises at home  Handouts given during visit include: none at this visit. She did not want copy of CareLink Pro Reports today   Monitoring/Evaluation:  Dietary intake, exercise, bolus wizard, and body weight in 2  months.

## 2014-03-08 ENCOUNTER — Ambulatory Visit: Payer: Medicare Other | Admitting: Physical Therapy

## 2014-03-11 ENCOUNTER — Other Ambulatory Visit: Payer: Self-pay | Admitting: *Deleted

## 2014-03-11 MED ORDER — METOPROLOL TARTRATE 100 MG PO TABS: 100.0000 mg | ORAL_TABLET | Freq: Two times a day (BID) | ORAL | Status: DC

## 2014-03-12 ENCOUNTER — Ambulatory Visit: Payer: Medicare Other | Admitting: Physical Therapy

## 2014-03-15 ENCOUNTER — Ambulatory Visit: Payer: Medicare Other | Admitting: Physical Therapy

## 2014-03-20 ENCOUNTER — Ambulatory Visit: Payer: Medicare Other | Admitting: Physical Therapy

## 2014-03-21 ENCOUNTER — Ambulatory Visit: Payer: Medicare Other | Admitting: Physical Therapy

## 2014-03-25 ENCOUNTER — Ambulatory Visit: Payer: Medicare Other | Attending: Family Medicine | Admitting: Physical Therapy

## 2014-03-25 DIAGNOSIS — M6281 Muscle weakness (generalized): Secondary | ICD-10-CM | POA: Insufficient documentation

## 2014-03-25 DIAGNOSIS — IMO0001 Reserved for inherently not codable concepts without codable children: Secondary | ICD-10-CM | POA: Diagnosis not present

## 2014-03-25 DIAGNOSIS — M25519 Pain in unspecified shoulder: Secondary | ICD-10-CM | POA: Diagnosis not present

## 2014-03-25 DIAGNOSIS — R262 Difficulty in walking, not elsewhere classified: Secondary | ICD-10-CM | POA: Insufficient documentation

## 2014-03-29 ENCOUNTER — Ambulatory Visit: Payer: Medicare Other | Admitting: Physical Therapy

## 2014-04-01 ENCOUNTER — Ambulatory Visit: Payer: Medicare Other | Admitting: Physical Therapy

## 2014-04-05 ENCOUNTER — Ambulatory Visit: Payer: Medicare Other | Admitting: Physical Therapy

## 2014-04-09 ENCOUNTER — Ambulatory Visit: Payer: Medicare Other | Admitting: Physical Therapy

## 2014-04-11 ENCOUNTER — Ambulatory Visit: Payer: Medicare Other | Admitting: Physical Therapy

## 2014-04-11 ENCOUNTER — Other Ambulatory Visit: Payer: Self-pay

## 2014-04-11 MED ORDER — LOSARTAN POTASSIUM 100 MG PO TABS: 100.0000 mg | ORAL_TABLET | Freq: Every day | ORAL | Status: DC

## 2014-04-15 ENCOUNTER — Ambulatory Visit: Payer: Medicare Other | Admitting: Physical Therapy

## 2014-04-16 DIAGNOSIS — M216X9 Other acquired deformities of unspecified foot: Secondary | ICD-10-CM | POA: Diagnosis not present

## 2014-04-16 DIAGNOSIS — L608 Other nail disorders: Secondary | ICD-10-CM | POA: Diagnosis not present

## 2014-04-16 DIAGNOSIS — E119 Type 2 diabetes mellitus without complications: Secondary | ICD-10-CM | POA: Diagnosis not present

## 2014-04-17 ENCOUNTER — Ambulatory Visit: Payer: Medicare Other | Admitting: Physical Therapy

## 2014-04-23 ENCOUNTER — Other Ambulatory Visit: Payer: Self-pay | Admitting: *Deleted

## 2014-04-23 ENCOUNTER — Ambulatory Visit: Payer: Medicare Other | Admitting: Physical Therapy

## 2014-04-23 MED ORDER — FUROSEMIDE 40 MG PO TABS: 40.0000 mg | ORAL_TABLET | Freq: Every day | ORAL | Status: DC

## 2014-04-25 ENCOUNTER — Ambulatory Visit: Payer: Medicare Other | Admitting: Physical Therapy

## 2014-05-07 ENCOUNTER — Encounter: Payer: Medicare Other | Attending: Endocrinology | Admitting: *Deleted

## 2014-05-07 ENCOUNTER — Encounter: Payer: Self-pay | Admitting: *Deleted

## 2014-05-07 DIAGNOSIS — E1165 Type 2 diabetes mellitus with hyperglycemia: Secondary | ICD-10-CM

## 2014-05-07 DIAGNOSIS — E669 Obesity, unspecified: Secondary | ICD-10-CM | POA: Diagnosis not present

## 2014-05-07 DIAGNOSIS — IMO0001 Reserved for inherently not codable concepts without codable children: Secondary | ICD-10-CM

## 2014-05-07 DIAGNOSIS — Z713 Dietary counseling and surveillance: Secondary | ICD-10-CM | POA: Diagnosis not present

## 2014-05-07 DIAGNOSIS — E119 Type 2 diabetes mellitus without complications: Secondary | ICD-10-CM | POA: Diagnosis not present

## 2014-05-07 NOTE — Progress Notes (Signed)
  Medical Nutrition Therapy:  Appt start time: 1030 end time:  1115.  Assessment:  Primary concerns today 05/07/2014: patient here for diabetes education and help with obesity follow up visit. She states she feels more in charge of her health since attending Rehab over the last 2 months. Happy with 5 pound weight loss since last visit here. She now can do the physical therapy exercises at home and has resumed some Yoga exercises from [facility]  that are not on the floor. Eating more fresh fruits and vegetables. Has been recording BG and Carbs  MEDICATIONS: see list. Currently on Medtronic insulin pump  Progress Towards Goal(s):  In progress.   Nutritional Diagnosis:  NB-1.1 Food and nutrition-related knowledge deficit As related to carb counting and efficient use of insulin pump.  States her last A1c at MD office is up to 8.6%    Intervention:     [facility] Reports indicate her 2 week average BG is down from 224 to 184 +/- 53 mg/dl, still with with no hypoglycemia. Total Daily Dose of insulin decreased from 165 to 140 units per day!   Plan: Continue recording your BG and Carb on paper, you may use the Log Sheet I provided or your own. This will help you be aware of your food portions as well as BG management. Continue to use Bolus Wizard with every meal Continue checking BG before the meal and giving Correction Bolus first, then entering food for Meal Bolus after Correction has been delivered. Continue to aim for 3 Carb Choices per meal (45 grams)  Continue with the fresh fruits and vegetables, good job!  Aim for 0-2 Carbs per snack if hungry  Continue with your physical therapy and Yoga exerces 15 minutes every day (5 minutes after every meal)   Handouts given during visit include: none at this visit. She did not want copy of CareLink Pro Reports today   Monitoring/Evaluation:  Dietary intake, exercise, bolus wizard, and body weight in 2 months.

## 2014-05-07 NOTE — Patient Instructions (Signed)
Plan: Continue recording your BG and Carb on paper, you may use the Log Sheet I provided or your own. This will help you be aware of your food portions as well as BG management. Continue to use Bolus Wizard with every meal Continue checking BG before the meal and giving Correction Bolus first, then entering food for Meal Bolus after Correction has been delivered. Continue to aim for 3 Carb Choices per meal (45 grams)  Continue with the fresh fruits and vegetables, good job!  Aim for 0-2 Carbs per snack if hungry  Continue with your physical therapy and Yoga exerces 15 minutes every day (5 minutes after every meal)

## 2014-06-11 DIAGNOSIS — IMO0001 Reserved for inherently not codable concepts without codable children: Secondary | ICD-10-CM | POA: Diagnosis not present

## 2014-06-18 DIAGNOSIS — L84 Corns and callosities: Secondary | ICD-10-CM | POA: Diagnosis not present

## 2014-06-18 DIAGNOSIS — L608 Other nail disorders: Secondary | ICD-10-CM | POA: Diagnosis not present

## 2014-06-18 DIAGNOSIS — E119 Type 2 diabetes mellitus without complications: Secondary | ICD-10-CM | POA: Diagnosis not present

## 2014-07-09 ENCOUNTER — Encounter: Payer: Medicare Other | Attending: Endocrinology | Admitting: *Deleted

## 2014-07-09 ENCOUNTER — Ambulatory Visit: Payer: BC Managed Care – PPO | Admitting: *Deleted

## 2014-07-09 VITALS — Ht 59.5 in | Wt 249.9 lb

## 2014-07-09 DIAGNOSIS — E1165 Type 2 diabetes mellitus with hyperglycemia: Secondary | ICD-10-CM

## 2014-07-09 DIAGNOSIS — IMO0001 Reserved for inherently not codable concepts without codable children: Secondary | ICD-10-CM

## 2014-07-09 DIAGNOSIS — E669 Obesity, unspecified: Secondary | ICD-10-CM | POA: Diagnosis not present

## 2014-07-09 DIAGNOSIS — E119 Type 2 diabetes mellitus without complications: Secondary | ICD-10-CM | POA: Insufficient documentation

## 2014-07-09 DIAGNOSIS — Z713 Dietary counseling and surveillance: Secondary | ICD-10-CM | POA: Insufficient documentation

## 2014-07-09 NOTE — Patient Instructions (Signed)
Plan: Continue recording your BG and Carb on paper, you may use the Log Sheet I provided or your own. This will help you be aware of your food portions as well as BG management. Continue to use Bolus Wizard with every meal Continue checking BG before the meal and giving Correction Bolus first, then entering food for Meal Bolus after Correction has been delivered. Continue to aim for 3 Carb Choices per meal (45 grams)  Continue with the fresh fruits and vegetables, good job!  Aim for 0-2 Carbs per snack if hungry  Continue with your exercise at the [facility] twice a week or more as tolerated, Good Job!

## 2014-07-09 NOTE — Progress Notes (Signed)
  Medical Nutrition Therapy:  Appt start time: 2774 end time:  1700.  Assessment:  Primary concerns today 07/09/2014: patient here for diabetes education and help with obesity follow up visit. Weight stable at this visit. Her pump failed last month so she has put the settings into the new replacement pump, but did not have the time correctly. She states she is doing more exercise by going to [facility] and uses treadmill twice a week.  She is now up to 22 minutes (from 15 minutes) for 0.4 miles and an incline of 1.5. She has also doe exercises for vertigo and that has gone away. She plans to increase to 3 days a week now.  She recorded her BG and food intake for a couple of weeks after last visit, but has not been able to continue.  MEDICATIONS: see list. Currently on Medtronic insulin pump  Progress Towards Goal(s):  In progress.   Nutritional Diagnosis:  NB-1.1 Food and nutrition-related knowledge deficit As related to carb counting and efficient use of insulin pump.  States her last A1c at MD office is down to 7.7% on 06/11/14    Intervention:     [facility]Link Reports indicate her 2 week average BG is 206 mg/dl with with no hypoglycemia. Total Daily Dose of insulin id 168 tunits per day.   Plan: Continue recording your BG and Carb on paper, you may use the Log Sheet I provided or your own. This will help you be aware of your food portions as well as BG management. Continue to use Bolus Wizard with every meal Continue checking BG before the meal and giving Correction Bolus first, then entering food for Meal Bolus after Correction has been delivered. Continue to aim for 3 Carb Choices per meal (45 grams)  Continue with the fresh fruits and vegetables, good job!  Aim for 0-2 Carbs per snack if hungry  Continue with your exercise at the [facility] twice a week or more as tolerated, Good Job!   Handouts given during visit include: none at this visit. She did not want copy of CareLink Pro  Reports today   Monitoring/Evaluation:  Dietary intake, exercise, bolus wizard, and body weight in 2 months.

## 2014-07-17 ENCOUNTER — Encounter: Payer: Self-pay | Admitting: *Deleted

## 2014-08-05 ENCOUNTER — Encounter: Payer: Self-pay | Admitting: *Deleted

## 2014-08-09 ENCOUNTER — Other Ambulatory Visit: Payer: Self-pay

## 2014-08-12 DIAGNOSIS — H401222 Low-tension glaucoma, left eye, moderate stage: Secondary | ICD-10-CM | POA: Diagnosis not present

## 2014-08-12 DIAGNOSIS — H40051 Ocular hypertension, right eye: Secondary | ICD-10-CM | POA: Diagnosis not present

## 2014-08-12 DIAGNOSIS — H401211 Low-tension glaucoma, right eye, mild stage: Secondary | ICD-10-CM | POA: Diagnosis not present

## 2014-08-15 DIAGNOSIS — Z23 Encounter for immunization: Secondary | ICD-10-CM | POA: Diagnosis not present

## 2014-08-20 DIAGNOSIS — L602 Onychogryphosis: Secondary | ICD-10-CM | POA: Diagnosis not present

## 2014-08-20 DIAGNOSIS — E119 Type 2 diabetes mellitus without complications: Secondary | ICD-10-CM | POA: Diagnosis not present

## 2014-08-28 DIAGNOSIS — H401211 Low-tension glaucoma, right eye, mild stage: Secondary | ICD-10-CM | POA: Diagnosis not present

## 2014-08-28 DIAGNOSIS — H401222 Low-tension glaucoma, left eye, moderate stage: Secondary | ICD-10-CM | POA: Diagnosis not present

## 2014-09-03 ENCOUNTER — Encounter: Payer: Self-pay | Admitting: Cardiology

## 2014-09-03 ENCOUNTER — Ambulatory Visit (INDEPENDENT_AMBULATORY_CARE_PROVIDER_SITE_OTHER): Payer: Medicare Other | Admitting: Cardiology

## 2014-09-03 VITALS — BP 142/68 | HR 100 | Ht 59.5 in | Wt 252.4 lb

## 2014-09-03 DIAGNOSIS — Z79899 Other long term (current) drug therapy: Secondary | ICD-10-CM

## 2014-09-03 DIAGNOSIS — I10 Essential (primary) hypertension: Secondary | ICD-10-CM

## 2014-09-03 DIAGNOSIS — E7800 Pure hypercholesterolemia, unspecified: Secondary | ICD-10-CM
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
## 2014-09-03 NOTE — Patient Instructions (Signed)
The current medical regimen is effective;  continue present plan and medications.  Please have blood work today (BMP/ALT and Lipid).  Follow up in 1 year with Dr. Marlou Porch.  You will receive a letter in the mail 2 months before you are due.  Please call us when you receive this letter to schedule your follow up appointment.  Thank you for choosing [facility]!!

## 2014-09-04 LAB — LIPID PANEL
CHOL/HDL RATIO: 5
Cholesterol: 165 mg/dL (ref 0–200)
HDL: 32.7 mg/dL — AB (ref >39.00)
NONHDL: 132.3
VLDL: 114.8 mg/dL — ABNORMAL HIGH (ref 0.0–40.0)

## 2014-09-04 LAB — BASIC METABOLIC PANEL
BUN: 18 mg/dL (ref 6–23)
CHLORIDE: 98 meq/L (ref 96–112)
CO2: 31 meq/L (ref 19–32)
Calcium: 9.5 mg/dL (ref 8.4–10.5)
Creatinine, Ser: 0.7 mg/dL (ref 0.4–1.2)
GFR: 91.93 mL/min (ref >60.00)
Glucose, Bld: 179 mg/dL — ABNORMAL HIGH (ref 70–99)
POTASSIUM: 4.5 meq/L (ref 3.5–5.1)
SODIUM: 136 meq/L (ref 135–145)

## 2014-09-04 LAB — LDL CHOLESTEROL, DIRECT: Direct LDL: 67.3 mg/dL

## 2014-09-04 LAB — ALT: ALT: 35 U/L (ref 0–35)

## 2014-09-10 ENCOUNTER — Encounter: Payer: Medicare Other | Attending: Endocrinology | Admitting: *Deleted

## 2014-09-10 VITALS — Ht 59.5 in | Wt 243.8 lb

## 2014-09-10 DIAGNOSIS — Z713 Dietary counseling and surveillance: Secondary | ICD-10-CM | POA: Insufficient documentation

## 2014-09-10 DIAGNOSIS — E119 Type 2 diabetes mellitus without complications: Secondary | ICD-10-CM | POA: Diagnosis not present

## 2014-09-10 DIAGNOSIS — Z794 Long term (current) use of insulin: Secondary | ICD-10-CM | POA: Diagnosis not present

## 2014-09-10 NOTE — Progress Notes (Signed)
  Medical Nutrition Therapy:  Appt start time: 1400 end time:  3559.  Assessment:  Primary concerns today 09/10/2014: patient here for diabetes education and help with obesity follow up visit. She is happy with 6 pound weight loss this visit. She is trying to aim closer to 3 Carb Choices at each meal. She is walking on treadmill at gym with her Dad twice a week at [facility], 3 times a week she is trying to [employer] [employer] classes 30 - 45 minutes with stretches and other position type exercises along with weights for total of 3-4 times a week now! Wants to increase to 5-6 days going forward.   MEDICATIONS: see list. Currently on Medtronic insulin pump  Progress Towards Goal(s):  In progress.   Nutritional Diagnosis:  NB-1.1 Food and nutrition-related knowledge deficit As related to carb counting and efficient use of insulin pump.  States her last A1c at MD office is down to 7.6% on 06/11/14    Intervention:      [facility]Link Reports indicate her 2 week average BG is 248 mg/dl with with no hypoglycemia. Total Daily Dose of insulin is 157 tunits per day with only 42% from Basal and 58% from Bolus. Fasting BG too high at 250 mg/dl. I would recommend an increase of her Basal to 3.30 units per hour for all 24 hours to equal a total of 80 units from Basal Rate to get closer to 50 %. I do not have permission to make pump setting changes so no changes were made today, this is just a recommendation.  Plan for patient: Consider recording your BG and Carb on paper, you may use the Log Sheet I provided or your own. This will help you be aware of your food portions as well as BG management. Continue to use Bolus Wizard with every meal Continue checking BG before the meal and giving Correction Bolus first, then entering food for Meal Bolus after Correction has been delivered. Continue to aim for 3 Carb Choices per meal (45 grams) +/- 1 either way Continue with the fresh fruits and  vegetables, good job!  Aim for 0-2 Carbs per snack if hungry  Continue with your exercise at the [facility] 3-4 times a week or more as tolerated, Good Job!    Handouts given during visit include: none at this visit. She did not want copy of CareLink Pro Reports today  Monitoring/Evaluation:  Dietary intake, exercise, bolus wizard, and body weight in 3 months.

## 2014-09-10 NOTE — Patient Instructions (Signed)
Plan: Consider recording your BG and Carb on paper, you may use the Log Sheet I provided or your own. This will help you be aware of your food portions as well as BG management. Continue to use Bolus Wizard with every meal Continue checking BG before the meal and giving Correction Bolus first, then entering food for Meal Bolus after Correction has been delivered. Continue to aim for 3 Carb Choices per meal (45 grams) +/- 1 either way Continue with the fresh fruits and vegetables, good job!  Aim for 0-2 Carbs per snack if hungry  Continue with your exercise at the [facility] 3-4 times a week or more as tolerated, Good Job!

## 2014-09-12 ENCOUNTER — Telehealth: Payer: Self-pay | Admitting: Cardiology

## 2014-09-12 DIAGNOSIS — Z79899 Other long term (current) drug therapy: Secondary | ICD-10-CM

## 2014-09-12 DIAGNOSIS — E785 Hyperlipidemia, unspecified: Secondary | ICD-10-CM

## 2014-09-12 NOTE — Telephone Encounter (Signed)
NEW MSG    Patient  States she was called today and was returning the call. Please contact at [phone].

## 2014-09-12 NOTE — Telephone Encounter (Signed)
Reviewed results of lab with pt.  Please see those for further documentation.

## 2014-09-16 ENCOUNTER — Other Ambulatory Visit: Payer: Self-pay

## 2014-09-16 MED ORDER — METOPROLOL TARTRATE 100 MG PO TABS: 100.0000 mg | ORAL_TABLET | Freq: Two times a day (BID) | ORAL | Status: DC

## 2014-09-16 MED ORDER — SPIRONOLACTONE 25 MG PO TABS: 25.0000 mg | ORAL_TABLET | Freq: Every day | ORAL | Status: DC

## 2014-09-23 DIAGNOSIS — E780 Pure hypercholesterolemia: Secondary | ICD-10-CM | POA: Diagnosis not present

## 2014-09-23 DIAGNOSIS — G609 Hereditary and idiopathic neuropathy, unspecified: Secondary | ICD-10-CM | POA: Diagnosis not present

## 2014-09-23 DIAGNOSIS — I10 Essential (primary) hypertension: Secondary | ICD-10-CM | POA: Diagnosis not present

## 2014-09-23 DIAGNOSIS — E1165 Type 2 diabetes mellitus with hyperglycemia: Secondary | ICD-10-CM | POA: Diagnosis not present

## 2014-10-16 ENCOUNTER — Other Ambulatory Visit: Payer: Self-pay | Admitting: Cardiology

## 2014-10-16 NOTE — Telephone Encounter (Signed)
Candee Furbish, MD at 09/03/2014 4:26 PM  furosemide (LASIX) 40 MG tablet Take 1 tablet (40 mg total) by mouth daily.   Patient Instructions     The current medical regimen is effective; continue present plan and medications

## 2014-10-22 DIAGNOSIS — L602 Onychogryphosis: Secondary | ICD-10-CM | POA: Diagnosis not present

## 2014-10-22 DIAGNOSIS — E1151 Type 2 diabetes mellitus with diabetic peripheral angiopathy without gangrene: Secondary | ICD-10-CM | POA: Diagnosis not present

## 2014-11-07 ENCOUNTER — Other Ambulatory Visit: Payer: Self-pay | Admitting: Cardiology

## 2014-11-07 DIAGNOSIS — E1165 Type 2 diabetes mellitus with hyperglycemia: Secondary | ICD-10-CM | POA: Diagnosis not present

## 2014-12-03 DIAGNOSIS — H401211 Low-tension glaucoma, right eye, mild stage: Secondary | ICD-10-CM | POA: Diagnosis not present

## 2014-12-03 DIAGNOSIS — H47232 Glaucomatous optic atrophy, left eye: Secondary | ICD-10-CM | POA: Diagnosis not present

## 2014-12-03 DIAGNOSIS — H401222 Low-tension glaucoma, left eye, moderate stage: Secondary | ICD-10-CM | POA: Diagnosis not present

## 2014-12-12 ENCOUNTER — Ambulatory Visit: Payer: BC Managed Care – PPO | Admitting: *Deleted

## 2014-12-25 DIAGNOSIS — L602 Onychogryphosis: Secondary | ICD-10-CM | POA: Diagnosis not present

## 2014-12-25 DIAGNOSIS — E1151 Type 2 diabetes mellitus with diabetic peripheral angiopathy without gangrene: Secondary | ICD-10-CM | POA: Diagnosis not present

## 2014-12-26 ENCOUNTER — Encounter: Payer: Medicare Other | Attending: Endocrinology | Admitting: *Deleted

## 2014-12-26 VITALS — Ht 59.5 in | Wt 251.7 lb

## 2014-12-26 DIAGNOSIS — Z794 Long term (current) use of insulin: Secondary | ICD-10-CM | POA: Insufficient documentation

## 2014-12-26 DIAGNOSIS — Z713 Dietary counseling and surveillance: Secondary | ICD-10-CM | POA: Diagnosis not present

## 2014-12-26 DIAGNOSIS — E118 Type 2 diabetes mellitus with unspecified complications: Secondary | ICD-10-CM

## 2014-12-26 NOTE — Patient Instructions (Signed)
Plan: Continue to use Bolus Wizard with every meal Continue checking BG before the meal and giving Correction Bolus first, then entering food for Meal Bolus after Correction has been delivered. Consider aiming for 3 Carb Choices per meal (45 grams) +/- 1 either way after your Dad's birthday Continue with the fresh fruits and vegetables, good job!  Aim for 0-2 Carbs per snack if hungry  Consider resuming your exercise at the [facility] 3-4 times a week or more as tolerated.

## 2014-12-26 NOTE — Progress Notes (Signed)
  Medical Nutrition Therapy:  Appt start time: 0915 end time:  0945.  Assessment:  Primary concerns today 12/26/14: patient here for diabetes education and help with obesity follow up visit. Weight gain of She is planning her Dad's 95th birthday party which is being held at her church the end of this month. She has not been as active lately, she does attend [employer] Classes on Saturdays but has not been going during the week as much. It is the only time she is alone without her Dad in the house with her.  A1c still over 8%  MEDICATIONS: see list. Currently on Medtronic insulin pump  Progress Towards Goal(s):  In progress.   Nutritional Diagnosis:  NB-1.1 Food and nutrition-related knowledge deficit As related to carb counting and efficient use of insulin pump.  States her last A1c at MD office is down to 7.6% on 06/11/14    Intervention:       CareLink Reports indicate her 2 week average BG has improved from  248 mg/dl to 220 +/- 56 mg/dl with with no hypoglycemia. Encouraged her to resume her activities at the [facility] as soon as her Dad's birthday party is done.  Plan: Continue to use Bolus Wizard with every meal Continue checking BG before the meal and giving Correction Bolus first, then entering food for Meal Bolus after Correction has been delivered. Consider aiming for 3 Carb Choices per meal (45 grams) +/- 1 either way after your Dad's birthday Continue with the fresh fruits and vegetables, good job!  Aim for 0-2 Carbs per snack if hungry  Consider resuming your exercise at the [facility] 3-4 times a week or more as tolerated.   Handouts given during visit include: none at this visit. She did not want copy of CareLink Pro Reports today  Monitoring/Evaluation:  Dietary intake, exercise, bolus wizard, and body weight in 3 months.

## 2015-02-10 DIAGNOSIS — E1165 Type 2 diabetes mellitus with hyperglycemia: Secondary | ICD-10-CM | POA: Diagnosis not present

## 2015-02-10 DIAGNOSIS — G609 Hereditary and idiopathic neuropathy, unspecified: Secondary | ICD-10-CM | POA: Diagnosis not present

## 2015-02-10 DIAGNOSIS — E780 Pure hypercholesterolemia: Secondary | ICD-10-CM | POA: Diagnosis not present

## 2015-02-10 DIAGNOSIS — I10 Essential (primary) hypertension: Secondary | ICD-10-CM | POA: Diagnosis not present

## 2015-02-17 DIAGNOSIS — H401211 Low-tension glaucoma, right eye, mild stage: Secondary | ICD-10-CM | POA: Diagnosis not present

## 2015-02-17 DIAGNOSIS — H3531 Nonexudative age-related macular degeneration: Secondary | ICD-10-CM | POA: Diagnosis not present

## 2015-02-17 DIAGNOSIS — H2513 Age-related nuclear cataract, bilateral: Secondary | ICD-10-CM | POA: Diagnosis not present

## 2015-02-17 DIAGNOSIS — H401222 Low-tension glaucoma, left eye, moderate stage: Secondary | ICD-10-CM | POA: Diagnosis not present

## 2015-02-17 DIAGNOSIS — H401231 Low-tension glaucoma, bilateral, mild stage: Secondary | ICD-10-CM | POA: Diagnosis not present

## 2015-02-19 DIAGNOSIS — Z0000 Encounter for general adult medical examination without abnormal findings: Secondary | ICD-10-CM | POA: Diagnosis not present

## 2015-02-19 DIAGNOSIS — E1140 Type 2 diabetes mellitus with diabetic neuropathy, unspecified: Secondary | ICD-10-CM | POA: Diagnosis not present

## 2015-02-19 DIAGNOSIS — I10 Essential (primary) hypertension: Secondary | ICD-10-CM | POA: Diagnosis not present

## 2015-02-19 DIAGNOSIS — H409 Unspecified glaucoma: Secondary | ICD-10-CM | POA: Diagnosis not present

## 2015-02-19 DIAGNOSIS — G4733 Obstructive sleep apnea (adult) (pediatric): Secondary | ICD-10-CM | POA: Diagnosis not present

## 2015-02-19 DIAGNOSIS — E782 Mixed hyperlipidemia: Secondary | ICD-10-CM | POA: Diagnosis not present

## 2015-02-19 DIAGNOSIS — Z1389 Encounter for screening for other disorder: Secondary | ICD-10-CM | POA: Diagnosis not present

## 2015-02-19 DIAGNOSIS — L659 Nonscarring hair loss, unspecified: Secondary | ICD-10-CM | POA: Diagnosis not present

## 2015-02-19 DIAGNOSIS — Z124 Encounter for screening for malignant neoplasm of cervix: Secondary | ICD-10-CM | POA: Diagnosis not present

## 2015-03-06 DIAGNOSIS — L84 Corns and callosities: Secondary | ICD-10-CM | POA: Diagnosis not present

## 2015-03-06 DIAGNOSIS — E1151 Type 2 diabetes mellitus with diabetic peripheral angiopathy without gangrene: Secondary | ICD-10-CM | POA: Diagnosis not present

## 2015-03-06 DIAGNOSIS — L602 Onychogryphosis: Secondary | ICD-10-CM | POA: Diagnosis not present

## 2015-03-20 DIAGNOSIS — E669 Obesity, unspecified: Secondary | ICD-10-CM | POA: Diagnosis not present

## 2015-03-20 DIAGNOSIS — Z6841 Body Mass Index (BMI) 40.0 and over, adult: Secondary | ICD-10-CM | POA: Diagnosis not present

## 2015-03-20 DIAGNOSIS — G4733 Obstructive sleep apnea (adult) (pediatric): Secondary | ICD-10-CM | POA: Diagnosis not present

## 2015-03-28 ENCOUNTER — Encounter: Payer: Self-pay | Admitting: *Deleted

## 2015-03-28 ENCOUNTER — Encounter: Payer: Medicare Other | Attending: Endocrinology | Admitting: *Deleted

## 2015-03-28 VITALS — Ht 59.5 in | Wt 246.5 lb

## 2015-03-28 DIAGNOSIS — Z794 Long term (current) use of insulin: Secondary | ICD-10-CM | POA: Insufficient documentation

## 2015-03-28 DIAGNOSIS — E118 Type 2 diabetes mellitus with unspecified complications: Secondary | ICD-10-CM | POA: Diagnosis not present

## 2015-03-28 DIAGNOSIS — Z713 Dietary counseling and surveillance: Secondary | ICD-10-CM | POA: Diagnosis not present

## 2015-03-28 DIAGNOSIS — IMO0002 Reserved for concepts with insufficient information to code with codable children: Secondary | ICD-10-CM

## 2015-03-28 DIAGNOSIS — E1165 Type 2 diabetes mellitus with hyperglycemia: Secondary | ICD-10-CM

## 2015-03-28 NOTE — Progress Notes (Signed)
  Medical Nutrition Therapy:  Appt start time: 0930 end time:  1000.  Assessment:  Primary concerns today 03/28/15: patient here for diabetes education and help with obesity follow up visit. Patient happy with 5 pound weight loss. She also is happy about added medications that have brought her BG down significantly too. She states her Dad's birthday was a big success. She is considering cycling as a potential form of exercise that she enjoyed in the past. A1c is coming down, she is waiting for next A1c since she started on Invokana.  A1c 8%  MEDICATIONS: see list. Currently on Medtronic insulin pump. MD has added Metformin and Invokana  Progress Towards Goal(s):  In progress.   Nutritional Diagnosis:  NB-1.1 Food and nutrition-related knowledge deficit As related to carb counting and efficient use of insulin pump.  States her last A1c at MD office is down to 8.0%     Intervention:       [facility] Reports indicate her 2 week average BG has again improved from  220 mg/dl to 185 +/- 50 mg/dl with with no hypoglycemia. Encouraged her to resume her activities at the [facility] now that her Dad's birthday party is done.  Plan: Continue to use Bolus Wizard with every meal Continue checking BG before the meal and giving Correction Bolus first, then entering food for Meal Bolus after Correction has been delivered. Consider aiming for 3 Carb Choices per meal (45 grams) +/- 1 either way after your Dad's birthday Continue with the fresh fruits and vegetables, good job!  Aim for 0-2 Carbs per snack if hungry  Consider resuming your exercise at the [facility] as tolerated.    Handouts given during visit include: none at this visit. She did not want copy of CareLink Pro Reports today  Monitoring/Evaluation:  Dietary intake, exercise, bolus wizard, and body weight in 3 months.

## 2015-03-28 NOTE — Patient Instructions (Signed)
Plan: Continue to use Bolus Wizard with every meal Continue checking BG before the meal and giving Correction Bolus first, then entering food for Meal Bolus after Correction has been delivered. Consider aiming for 3 Carb Choices per meal (45 grams) +/- 1 either way after your Dad's birthday Continue with the fresh fruits and vegetables, good job!  Aim for 0-2 Carbs per snack if hungry  Consider resuming your exercise at the [facility] as tolerated.

## 2015-04-21 ENCOUNTER — Other Ambulatory Visit: Payer: Self-pay

## 2015-05-13 DIAGNOSIS — L602 Onychogryphosis: Secondary | ICD-10-CM | POA: Diagnosis not present

## 2015-05-13 DIAGNOSIS — L84 Corns and callosities: Secondary | ICD-10-CM | POA: Diagnosis not present

## 2015-05-13 DIAGNOSIS — E1151 Type 2 diabetes mellitus with diabetic peripheral angiopathy without gangrene: Secondary | ICD-10-CM | POA: Diagnosis not present

## 2015-05-21 DIAGNOSIS — E780 Pure hypercholesterolemia: Secondary | ICD-10-CM | POA: Diagnosis not present

## 2015-05-21 DIAGNOSIS — G609 Hereditary and idiopathic neuropathy, unspecified: Secondary | ICD-10-CM | POA: Diagnosis not present

## 2015-05-21 DIAGNOSIS — E1165 Type 2 diabetes mellitus with hyperglycemia: Secondary | ICD-10-CM | POA: Diagnosis not present

## 2015-05-21 DIAGNOSIS — I10 Essential (primary) hypertension: Secondary | ICD-10-CM | POA: Diagnosis not present

## 2015-05-26 DIAGNOSIS — H401222 Low-tension glaucoma, left eye, moderate stage: Secondary | ICD-10-CM | POA: Diagnosis not present

## 2015-05-26 DIAGNOSIS — H401211 Low-tension glaucoma, right eye, mild stage: Secondary | ICD-10-CM | POA: Diagnosis not present

## 2015-06-16 DIAGNOSIS — Z9989 Dependence on other enabling machines and devices: Secondary | ICD-10-CM | POA: Diagnosis not present

## 2015-06-16 DIAGNOSIS — G4733 Obstructive sleep apnea (adult) (pediatric): Secondary | ICD-10-CM | POA: Diagnosis not present

## 2015-06-25 DIAGNOSIS — Z1231 Encounter for screening mammogram for malignant neoplasm of breast: Secondary | ICD-10-CM | POA: Diagnosis not present

## 2015-07-04 ENCOUNTER — Ambulatory Visit: Payer: Medicare Other | Admitting: *Deleted

## 2015-07-10 DIAGNOSIS — Z23 Encounter for immunization: Secondary | ICD-10-CM | POA: Diagnosis not present

## 2015-07-15 DIAGNOSIS — E1151 Type 2 diabetes mellitus with diabetic peripheral angiopathy without gangrene: Secondary | ICD-10-CM | POA: Diagnosis not present

## 2015-07-15 DIAGNOSIS — L308 Other specified dermatitis: Secondary | ICD-10-CM | POA: Diagnosis not present

## 2015-07-15 DIAGNOSIS — L602 Onychogryphosis: Secondary | ICD-10-CM | POA: Diagnosis not present

## 2015-07-17 ENCOUNTER — Encounter: Payer: Medicare Other | Attending: Family Medicine | Admitting: *Deleted

## 2015-07-17 ENCOUNTER — Encounter: Payer: Self-pay | Admitting: *Deleted

## 2015-07-17 VITALS — Ht 59.5 in | Wt 245.5 lb

## 2015-07-17 DIAGNOSIS — Z794 Long term (current) use of insulin: Secondary | ICD-10-CM | POA: Insufficient documentation

## 2015-07-17 DIAGNOSIS — Z713 Dietary counseling and surveillance: Secondary | ICD-10-CM | POA: Insufficient documentation

## 2015-07-17 DIAGNOSIS — IMO0002 Reserved for concepts with insufficient information to code with codable children: Secondary | ICD-10-CM

## 2015-07-17 DIAGNOSIS — E1165 Type 2 diabetes mellitus with hyperglycemia: Secondary | ICD-10-CM

## 2015-07-17 DIAGNOSIS — E118 Type 2 diabetes mellitus with unspecified complications: Secondary | ICD-10-CM | POA: Insufficient documentation

## 2015-07-17 NOTE — Progress Notes (Signed)
  Medical Nutrition Therapy:  Appt start time: 1630 end time:  1700.  Assessment:  Primary concerns today 07/17/15: patient here for diabetes education and help with obesity follow up visit. Patient happy with 1 pound weight loss. She states her FBG are in the 160-170 mg/dl range.   A1c 8%  MEDICATIONS: see list. Currently on Medtronic insulin pump. MD has added Metformin and Invokana  Progress Towards Goal(s):  In progress.   Nutritional Diagnosis:  NB-1.1 Food and nutrition-related knowledge deficit As related to carb counting and efficient use of insulin pump.  States her last A1c at MD office is down to 8.0%     Intervention:  We did not download her pump today. We discussed the importance of checking her BG before each meal and resuming her meal plan more diligently. Encouraged her to follow up with her exercise plan stating how beneficial that would be for BG control and weight management.  Plan: Continue to use Bolus Wizard with every meal Get back to checking BG before the meal and giving Correction Bolus first, then entering food for Meal Bolus after Correction has been delivered. Consider aiming for 3 Carb Choices per meal (45 grams) +/- 1 either way after your Dad's birthday Continue with the fresh fruits and vegetables, good job!  Aim for 0-2 Carbs per snack if hungry  Consider resuming your exercise at the [facility] as tolerated.    Handouts given during visit include: none at this visit.   Monitoring/Evaluation:  Dietary intake, exercise, bolus wizard, and body weight in 3 months.

## 2015-07-17 NOTE — Patient Instructions (Signed)
Plan: Continue to use Bolus Wizard with every meal Get back to checking BG before the meal and giving Correction Bolus first, then entering food for Meal Bolus after Correction has been delivered. Consider aiming for 3 Carb Choices per meal (45 grams) +/- 1 either way after your Dad's birthday Continue with the fresh fruits and vegetables, good job!  Aim for 0-2 Carbs per snack if hungry  Consider resuming your exercise at the [facility] as tolerated.

## 2015-08-21 DIAGNOSIS — E1165 Type 2 diabetes mellitus with hyperglycemia: Secondary | ICD-10-CM | POA: Diagnosis not present

## 2015-08-21 DIAGNOSIS — E7800 Pure hypercholesterolemia, unspecified: Secondary | ICD-10-CM | POA: Diagnosis not present

## 2015-08-21 DIAGNOSIS — I10 Essential (primary) hypertension: Secondary | ICD-10-CM | POA: Diagnosis not present

## 2015-08-21 DIAGNOSIS — G609 Hereditary and idiopathic neuropathy, unspecified: Secondary | ICD-10-CM | POA: Diagnosis not present

## 2015-08-29 ENCOUNTER — Encounter: Payer: Medicare Other | Attending: Family Medicine | Admitting: *Deleted

## 2015-08-29 ENCOUNTER — Encounter: Payer: Self-pay | Admitting: *Deleted

## 2015-08-29 DIAGNOSIS — Z794 Long term (current) use of insulin: Secondary | ICD-10-CM | POA: Insufficient documentation

## 2015-08-29 DIAGNOSIS — Z713 Dietary counseling and surveillance: Secondary | ICD-10-CM | POA: Diagnosis not present

## 2015-08-29 DIAGNOSIS — E118 Type 2 diabetes mellitus with unspecified complications: Secondary | ICD-10-CM | POA: Insufficient documentation

## 2015-08-29 NOTE — Progress Notes (Signed)
  Medical Nutrition Therapy:  Appt start time: 1000 end time:  1030.  Assessment:  Primary concerns today 08/29/15: patient here for diabetes education and help with obesity follow up visit. She states her MD increased her Invokana from 100 mg to 300 mg this past week. She is complaining of increased water retention and her weight is up 1 pound today from her last visit in August. She states she has been eating out more with her father and they were eating more hot dogs watching the [employer] games this past week.   Patient states her last A1c: 7.8%  I uploaded her pump to [facility] and reviewed the following reports:        MEDICATIONS: see list. Currently on Medtronic insulin pump. MD has added Metformin and increase the Invokana dose from 100 to 300 mg  Progress Towards Goal(s):  In progress.   Nutritional Diagnosis:  NB-1.1 Food and nutrition-related knowledge deficit As related to carb counting and efficient use of insulin pump.  States her last A1c at MD office is down to 8.0%     Intervention: Based on the [facility] Reports, she is testing more often now, an average of 4 times a day compared to 3/day at our last visit. Her FBG are higher, but daytime numbers have improved, probably due to giving more correction doses with more frequent BG testing. I am concerned about her edema, so we discussed the urgency of getting back on a sodium restriction immediately. I provided her with Sodium handouts and we reviewed them together. I rememded her that she is taking a lot of care of her father but she needs to consider the level of care for herself too.  Plan: Continue to use Bolus Wizard with every meal Let's get more serious with your sodium intake to help with water retention problems. We have reviewed the Sodium Handouts today. Contiue checking BG before the meal and giving Correction Bolus first, then entering food for Meal Bolus after Correction has been delivered. Consider  aiming for 3 Carb Choices per meal (45 grams) +/- 1 either way  Continue with the fresh fruits and vegetables  Aim for 0-2 Carbs per snack if hungry  Consider resuming your exercise at the [facility] as tolerated   Handouts given during visit include: Sodium handouts  Monitoring/Evaluation:  Dietary intake, exercise, bolus wizard, and body weight in 2 months.

## 2015-08-29 NOTE — Patient Instructions (Signed)
Plan: Continue to use Bolus Wizard with every meal Let's get more serious with your sodium intake to help with water retention problems. We have reviewed the Sodium Handouts today. Contiue checking BG before the meal and giving Correction Bolus first, then entering food for Meal Bolus after Correction has been delivered. Consider aiming for 3 Carb Choices per meal (45 grams) +/- 1 either way  Continue with the fresh fruits and vegetables  Aim for 0-2 Carbs per snack if hungry  Consider resuming your exercise at the [facility] as tolerated.

## 2015-09-12 ENCOUNTER — Other Ambulatory Visit: Payer: Self-pay | Admitting: Cardiology

## 2015-09-16 ENCOUNTER — Other Ambulatory Visit: Payer: Self-pay | Admitting: Cardiology

## 2015-09-16 MED ORDER — SPIRONOLACTONE 25 MG PO TABS: ORAL_TABLET | ORAL | Status: DC

## 2015-09-16 MED ORDER — LOSARTAN POTASSIUM 100 MG PO TABS: 100.0000 mg | ORAL_TABLET | Freq: Every day | ORAL | Status: DC

## 2015-09-17 DIAGNOSIS — L84 Corns and callosities: Secondary | ICD-10-CM | POA: Diagnosis not present

## 2015-09-17 DIAGNOSIS — L602 Onychogryphosis: Secondary | ICD-10-CM | POA: Diagnosis not present

## 2015-09-17 DIAGNOSIS — E1351 Other specified diabetes mellitus with diabetic peripheral angiopathy without gangrene: Secondary | ICD-10-CM | POA: Diagnosis not present

## 2015-10-09 DIAGNOSIS — L723 Sebaceous cyst: Secondary | ICD-10-CM | POA: Diagnosis not present

## 2015-10-11 ENCOUNTER — Other Ambulatory Visit: Payer: Self-pay | Admitting: Cardiology

## 2015-10-15 DIAGNOSIS — L723 Sebaceous cyst: Secondary | ICD-10-CM | POA: Diagnosis not present

## 2015-10-30 ENCOUNTER — Other Ambulatory Visit: Payer: Self-pay | Admitting: Cardiology

## 2015-10-31 ENCOUNTER — Encounter: Payer: Medicare Other | Attending: Family Medicine | Admitting: *Deleted

## 2015-10-31 ENCOUNTER — Encounter: Payer: Self-pay | Admitting: *Deleted

## 2015-10-31 DIAGNOSIS — E118 Type 2 diabetes mellitus with unspecified complications: Secondary | ICD-10-CM | POA: Diagnosis not present

## 2015-10-31 DIAGNOSIS — Z794 Long term (current) use of insulin: Secondary | ICD-10-CM | POA: Insufficient documentation

## 2015-10-31 DIAGNOSIS — Z713 Dietary counseling and surveillance: Secondary | ICD-10-CM | POA: Diagnosis not present

## 2015-10-31 DIAGNOSIS — E1165 Type 2 diabetes mellitus with hyperglycemia: Secondary | ICD-10-CM

## 2015-10-31 DIAGNOSIS — IMO0002 Reserved for concepts with insufficient information to code with codable children: Secondary | ICD-10-CM

## 2015-10-31 NOTE — Patient Instructions (Signed)
Plan: We see a 2 pound weight loss in past 2 months today When looking at the [place] your average BG had decreased from 200 +/- 72 mg/dl down to 173 +/- 46 mg/dl all of which is a very good improvement. Continue to use Bolus Wizard with every meal Let's get continue working on your sodium intake to help with water retention problems.  Continue checking BG before the meal and giving Correction Bolus first, then entering food for Meal Bolus after Correction has been delivered. Consider aiming for 3 Carb Choices per meal (45 grams) +/- 1 either way  Continue with the fresh fruits and vegetables  Aim for 0-2 Carbs per snack if hungry  Consider resuming your exercise at the [facility] as tolerated.

## 2015-10-31 NOTE — Progress Notes (Signed)
  Medical Nutrition Therapy:  Appt start time: 1100 end time:  1130.  Assessment:  Primary concerns today 10/31/15: patient here for diabetes education and help with obesity follow up visit. She is pleased with 2 pound weight loss in past 2 months. We will review [facility] insulin pump Reports today   Patient states her last A1c: 7.8%  I uploaded her pump to [facility] and reviewed the following reports:         MEDICATIONS: see list. Currently on Medtronic insulin pump. MD has added Metformin and increase the Invokana dose from 100 to 300 mg  Progress Towards Goal(s):  In progress.   Nutritional Diagnosis:  NB-1.1 Food and nutrition-related knowledge deficit As related to carb counting and efficient use of insulin pump.  States her last A1c at MD office is down to 8.0%     Intervention: Based on the [facility] Reports, Her FBG are improving. Average BG per this report has improved from 200 to 173 mg/dl and the standard deviation has also improved from 72 to 46 mg/dl.  To have weight loss over the Thanksgiving and Christmas holiday along with improved BG and decreased standard deviation are 3 significant improvements for her!  Plan: We see a 2 pound weight loss in past 2 months today When looking at the [place] your average BG had decreased from 200 +/- 72 mg/dl down to 173 +/- 46 mg/dl all of which is a very good improvement. Continue to use Bolus Wizard with every meal Let's get continue working on your sodium intake to help with water retention problems.  Continue checking BG before the meal and giving Correction Bolus first, then entering food for Meal Bolus after Correction has been delivered. Consider aiming for 3 Carb Choices per meal (45 grams) +/- 1 either way  Continue with the fresh fruits and vegetables  Aim for 0-2 Carbs per snack if hungry  Consider resuming your exercise at the [facility] as tolerated.    Handouts given during visit include: none  today  Monitoring/Evaluation:  Dietary intake, exercise, bolus wizard, and body weight in 3 months.

## 2015-11-03 ENCOUNTER — Ambulatory Visit: Payer: Medicare Other | Admitting: Cardiology

## 2015-11-05 DIAGNOSIS — L720 Epidermal cyst: Secondary | ICD-10-CM | POA: Diagnosis not present

## 2015-11-05 DIAGNOSIS — I872 Venous insufficiency (chronic) (peripheral): Secondary | ICD-10-CM | POA: Diagnosis not present

## 2015-11-18 DIAGNOSIS — L84 Corns and callosities: Secondary | ICD-10-CM | POA: Diagnosis not present

## 2015-11-18 DIAGNOSIS — E1351 Other specified diabetes mellitus with diabetic peripheral angiopathy without gangrene: Secondary | ICD-10-CM | POA: Diagnosis not present

## 2015-11-18 DIAGNOSIS — L602 Onychogryphosis: Secondary | ICD-10-CM | POA: Diagnosis not present

## 2015-11-24 DIAGNOSIS — E7800 Pure hypercholesterolemia, unspecified: Secondary | ICD-10-CM | POA: Diagnosis not present

## 2015-11-24 DIAGNOSIS — G609 Hereditary and idiopathic neuropathy, unspecified: Secondary | ICD-10-CM | POA: Diagnosis not present

## 2015-11-24 DIAGNOSIS — I10 Essential (primary) hypertension: Secondary | ICD-10-CM | POA: Diagnosis not present

## 2015-11-24 DIAGNOSIS — E1165 Type 2 diabetes mellitus with hyperglycemia: Secondary | ICD-10-CM | POA: Diagnosis not present

## 2015-11-26 ENCOUNTER — Ambulatory Visit (INDEPENDENT_AMBULATORY_CARE_PROVIDER_SITE_OTHER): Payer: Medicare Other | Admitting: Cardiology

## 2015-11-26 ENCOUNTER — Encounter: Payer: Self-pay | Admitting: Cardiology

## 2015-11-26 VITALS — BP 144/98 | HR 77 | Ht 60.0 in | Wt 244.0 lb

## 2015-11-26 DIAGNOSIS — I10 Essential (primary) hypertension: Secondary | ICD-10-CM

## 2015-11-26 DIAGNOSIS — E7800 Pure hypercholesterolemia, unspecified: Secondary | ICD-10-CM | POA: Diagnosis not present

## 2015-11-26 DIAGNOSIS — R42 Dizziness and giddiness: Secondary | ICD-10-CM | POA: Diagnosis not present

## 2015-11-26 NOTE — Patient Instructions (Signed)

## 2015-11-26 NOTE — Progress Notes (Signed)
[facility]. [address] [place], [place]  [address] Phone: [phone] Fax:  [phone]  Date:  11/26/2015   ID:  Jessica, [name] 1945/12/10, MRN [id number]  PCP:  Jessica Caraway, MD   History of Present Illness: Jessica [name] is a 70 y.o. female with hx of obesity, hypertension, and hyperlipidemia. 02/23/13 here due to shortness of breath.. In 2010, cardiac catheterization showed no evidence of coronary artery disease.  She is here because of an episode where she was reaching across her bed when laying down to turn off her lamp and fell to the floor, fainted. No chest pain. No shortness of breath.   She has not had another episode like this. Occasionally she will feel slight dizziness when changing positions. Her blood pressure has been elevated in the past. She is on a multidrug regimen.   She takes care of her 73 year old father. Increased stress.   Occasionally she will feel cramping like sensation in her left flank/abdomen.    Echocardiogram was performed EF 65-70%, no WMA, but increase LA pressures.  She uses CPAP nightly. During her sleep study by Dr. Constance [name], she did have pauses she states.  Seen Jessica [name] at nutrition.   Sister lost weight to 204.    Prior vertigo. Cataract surgery. Tachycardia noted with ambulation.   Wt Readings from Last 3 Encounters:  11/26/15 244 lb (110.678 kg)  07/17/15 245 lb 8 oz (111.358 kg)  03/28/15 246 lb 8 oz (111.812 kg)     Past Medical History  Diagnosis Date  . Hypertension   . Hyperlipidemia   . Obesity   . Sleep apnea   . Diabetes mellitus without complication ([place])   . Diastolic dysfunction     Past Surgical History  Procedure Laterality Date  . Cesarean section    . Tonsillectomy and adenoidectomy    . Shoulder spurs removed    . Foot spurs removed    . Knee spurs removed    . Joint replacement    . Cardiac catheterization      -6/09-normal ejection fraction 65%, no angiographically  significant CAD., M. Jessica Grandt MD    Current Outpatient Prescriptions  Medication Sig Dispense Refill  . amoxicillin (AMOXIL) 500 MG capsule TAKE 4 TABLETS BY MOUTH 1 HOUR BEFORE APPOINTMENT  12  . aspirin 81 MG tablet Take 81 mg by mouth daily.    . B-D INS SYRINGE 0.5CC/31GX5/16 31G X 5/16" 0.5 ML MISC     . beta carotene w/minerals (OCUVITE) tablet Take 1 tablet by mouth daily. With Zeaxanthin    . Calcium Carbonate-Vitamin D 600-200 MG-UNIT TABS Take by mouth.    . canagliflozin (INVOKANA) 100 MG TABS tablet Take 300 mg by mouth daily.     . cholecalciferol (VITAMIN D) 1000 UNITS tablet Take by mouth daily. Take 2,000 daily    . clobetasol cream (TEMOVATE) 0.05 % APPLY TO AFFECTED AREAS 1 TO 2 TIMES DAILY NOT TO FACE,GROIN, OR ARMPIT  3  . Coenzyme Q10 (CO Q-10) 100 MG CAPS Take by mouth.    . dorzolamide-timolol (COSOPT) 22.3-6.8 MG/ML ophthalmic solution Place 1 drop into both eyes 2 (two) times daily.   4  . FLUZONE HIGH-DOSE 0.5 ML SUSY   0  . furosemide (LASIX) 40 MG tablet TAKE 1 TABLET BY MOUTH EVERY DAY 90 tablet 0  . Garlic 123XX123 MG CAPS Take by mouth.    . insulin lispro (HUMALOG) 100 UNIT/ML injection Inject into the  skin 3 (three) times daily before meals.    . Iron-Vitamin C (VITRON-C) 65-125 MG TABS Take by mouth.    . losartan (COZAAR) 100 MG tablet Take 1 tablet (100 mg total) by mouth daily. 15 tablet 0  . metFORMIN (GLUCOPHAGE-XR) 500 MG 24 hr tablet Take 500 mg by mouth daily with supper.    . metoprolol (LOPRESSOR) 100 MG tablet TAKE 1 TABLET (100 MG TOTAL) BY MOUTH 2 (TWO) TIMES DAILY. 180 tablet 0  . Multiple Vitamins-Minerals (CENTRUM SILVER PO) Take by mouth.    . ONE TOUCH ULTRA TEST test strip   11  . penicillin v potassium (VEETID) 500 MG tablet Take 500 mg by mouth 4 (four) times daily.   0  . simvastatin (ZOCOR) 40 MG tablet Take 40 mg by mouth every evening.    Marland Kitchen spironolactone (ALDACTONE) 25 MG tablet TAKE 1 TABLET (25 MG TOTAL) BY MOUTH DAILY. 30 tablet 0    . Travoprost, BAK Free, (TRAVATAN) 0.004 % SOLN ophthalmic solution 1 drop at bedtime.     No current facility-administered medications for this visit.    Allergies:    Allergies  Allergen Reactions  . Codeine   . Iodine     Social History:  The patient  reports that she has never smoked. She has never used smokeless tobacco. She reports that she does not drink alcohol.   ROS:  Please see the history of present illness.   Denies any fevers, chills, orthopnea, PND.   PHYSICAL EXAM: VS:  BP 144/98 mmHg  Pulse 77  Ht 5' (1.524 m)  Wt 244 lb (110.678 kg)  BMI 47.65 kg/m2 Well nourished, well developed, in no acute distress HEENT: normal Neck: no JVD Cardiac:  normal S1, S2; RRR; no murmur Lungs:  clear to auscultation bilaterally, no wheezing, rhonchi or rales Abd: soft, nontender, no hepatomegalyObese Ext: ankle edema and chronic leg edema. Rash being treated bilateral lower extremity Skin: warm and dry Neuro: no focal abnormalities noted  EKG-today 11/26/15-sinus rhythm, 77, no other abnormalities personally viewed-prior 03/06/14-normal rhythm, 79, poor R wave progression, no other abnormalities.     ASSESSMENT AND PLAN:  1. Fall/possible syncope-explained to her that when stretching across to turn off the lamp, she could have produced a Valsalva maneuver which then resulted in transient loss of consciousness. Her EKG today is reassuring. If this becomes a recurring theme, we will likely give her an event monitor. Pauses can be seen on sleep study which are common with obstructive sleep apnea and morbid obesity. She is being treated appropriately with CPAP. I checked orthostatics and her blood pressure increased with standing. 2. Morbid obesity-once again encourage weight loss. Low carbohydrate diet. 3. Hypertension-improved control. Multidrug regimen reviewed. Continue to monitor blood pressures at home. 4. Prior hyperkalemia- On Aldactone. She seems to be tolerating this very  well. 5. Atypical left sided chest pain-chronic. Likely musculoskeletal 6. Diabetes-per primary team. Labs at Dr. Chalmers [name]. Pump 7. Hyperlipidemia-continue with simvastatin especially with diabetes. Excellent.  8. We will see back in 6 months.  Signed, Candee Furbish, MD [facility]  11/26/2015 10:15 AM

## 2015-12-03 ENCOUNTER — Other Ambulatory Visit: Payer: Self-pay

## 2015-12-03 MED ORDER — LOSARTAN POTASSIUM 100 MG PO TABS: 100.0000 mg | ORAL_TABLET | Freq: Every day | ORAL | Status: DC

## 2015-12-06 ENCOUNTER — Other Ambulatory Visit: Payer: Self-pay | Admitting: Cardiology

## 2015-12-09 ENCOUNTER — Other Ambulatory Visit: Payer: Self-pay | Admitting: *Deleted

## 2015-12-09 DIAGNOSIS — H401222 Low-tension glaucoma, left eye, moderate stage: Secondary | ICD-10-CM | POA: Diagnosis not present

## 2015-12-09 DIAGNOSIS — H401211 Low-tension glaucoma, right eye, mild stage: Secondary | ICD-10-CM | POA: Diagnosis not present

## 2015-12-09 DIAGNOSIS — H40033 Anatomical narrow angle, bilateral: Secondary | ICD-10-CM | POA: Diagnosis not present

## 2015-12-09 MED ORDER — SPIRONOLACTONE 25 MG PO TABS: ORAL_TABLET | ORAL | Status: DC

## 2015-12-22 ENCOUNTER — Other Ambulatory Visit: Payer: Self-pay | Admitting: Cardiology

## 2016-01-20 DIAGNOSIS — L602 Onychogryphosis: Secondary | ICD-10-CM | POA: Diagnosis not present

## 2016-01-20 DIAGNOSIS — E1351 Other specified diabetes mellitus with diabetic peripheral angiopathy without gangrene: Secondary | ICD-10-CM | POA: Diagnosis not present

## 2016-01-26 ENCOUNTER — Other Ambulatory Visit: Payer: Self-pay | Admitting: Cardiology

## 2016-01-29 ENCOUNTER — Telehealth: Payer: Self-pay | Admitting: Cardiology

## 2016-01-29 ENCOUNTER — Encounter: Payer: Medicare Other | Attending: Family Medicine | Admitting: *Deleted

## 2016-01-29 VITALS — Ht 59.0 in | Wt 241.3 lb

## 2016-01-29 DIAGNOSIS — Z794 Long term (current) use of insulin: Secondary | ICD-10-CM | POA: Diagnosis not present

## 2016-01-29 DIAGNOSIS — E118 Type 2 diabetes mellitus with unspecified complications: Secondary | ICD-10-CM | POA: Insufficient documentation

## 2016-01-29 DIAGNOSIS — E119 Type 2 diabetes mellitus without complications: Secondary | ICD-10-CM

## 2016-01-29 DIAGNOSIS — Z713 Dietary counseling and surveillance: Secondary | ICD-10-CM | POA: Diagnosis not present

## 2016-01-29 NOTE — Patient Instructions (Addendum)
Plan: When looking at the [place] your average BG is fairly stable @ 184 +/- 57 mg/dl all of which is good  Continue to use Bolus Wizard with every meal Let's get continue working on your sodium intake to help with water retention problems.  Continue checking BG before the meal and giving Correction Bolus first, then entering food for Meal Bolus after Correction has been delivered. Consider aiming for 3 Carb Choices per meal (45 grams) +/- 1 either way  Continue with the fresh fruits and vegetables  Aim for 0-2 Carbs per snack if hungry  Consider resuming your exercise at the [facility] as tolerated  Consider other exercises either at home or at gym for at least 5 minutes at 3 different times of day every day to improve fitness level and prepare for more intense exercise in the future.

## 2016-01-29 NOTE — Progress Notes (Signed)
  Medical Nutrition Therapy:  Appt start time: 1000 end time:  1030.  Assessment:  Primary concerns today 01/29/16: patient here for diabetes education and help with obesity follow up visit.  We will review [facility] insulin pump Reports today   Patient states her last A1c: lowered from 7.8% to 7.4%on Nov 24, 2015  I uploaded her pump to [facility] and reviewed the following reports:      MEDICATIONS: see list. Currently on Medtronic insulin pump. MD has added Metformin and increase the Invokana dose from 100 to 300 mg  Progress Towards Goal(s):  In progress.   Nutritional Diagnosis:  NB-1.1 Food and nutrition-related knowledge deficit As related to carb counting and efficient use of insulin pump.  States her last A1c at MD office is down to 7.4%     Intervention: Based on the [facility]Link Reports, Her FBG are stable. Average BG per this report is 184 mg/dl and the standard deviation is now 57 mg/dl. Continued weight loss, although slow and stablization of her BG control are both positive aspects of her care. I reinforced the importance of increasing her activity level to assist with weight loss. I suggested at least 5 minutes of stretching, arm chair exercises or walking at least 3 times every day. We discussed that we all eat every day, sleep every day, take care of hygiene every day, so we need to provide our body with activity options every day. She expressed understanding and interest in being active several times every day to increase her fitness level and be able to do more intense exercises in the future.   Plan: When looking at the [place] your average BG is fairly stable @ 184 +/- 57 mg/dl all of which is good  Continue to use Bolus Wizard with every meal Let's get continue working on your sodium intake to help with water retention problems.  Continue checking BG before the meal and giving Correction Bolus first, then entering food for Meal Bolus after Correction has been  delivered. Consider aiming for 3 Carb Choices per meal (45 grams) +/- 1 either way  Continue with the fresh fruits and vegetables  Aim for 0-2 Carbs per snack if hungry  Consider resuming your exercise at the [facility] as tolerated  Consider other exercises either at home or at gym for at least 5 minutes at 3 different times of day every day to improve fitness level and prepare for more intense exercise in the future.    Handouts given during visit include: Arm Chair Exercise handout  Monitoring/Evaluation:  Dietary intake, exercise, bolus wizard, and body weight in 3 months.

## 2016-01-29 NOTE — Telephone Encounter (Signed)
Walk in pt form-records dropped off-gave to [employer]

## 2016-02-03 DIAGNOSIS — L02612 Cutaneous abscess of left foot: Secondary | ICD-10-CM | POA: Diagnosis not present

## 2016-02-03 DIAGNOSIS — L03032 Cellulitis of left toe: Secondary | ICD-10-CM | POA: Diagnosis not present

## 2016-02-03 DIAGNOSIS — M79675 Pain in left toe(s): Secondary | ICD-10-CM | POA: Diagnosis not present

## 2016-02-05 ENCOUNTER — Encounter: Payer: Self-pay | Admitting: *Deleted

## 2016-02-17 DIAGNOSIS — L03032 Cellulitis of left toe: Secondary | ICD-10-CM | POA: Diagnosis not present

## 2016-02-20 DIAGNOSIS — H4089 Other specified glaucoma: Secondary | ICD-10-CM | POA: Diagnosis not present

## 2016-02-20 DIAGNOSIS — Z1389 Encounter for screening for other disorder: Secondary | ICD-10-CM | POA: Diagnosis not present

## 2016-02-20 DIAGNOSIS — E782 Mixed hyperlipidemia: Secondary | ICD-10-CM | POA: Diagnosis not present

## 2016-02-20 DIAGNOSIS — Z7189 Other specified counseling: Secondary | ICD-10-CM | POA: Diagnosis not present

## 2016-02-20 DIAGNOSIS — R2689 Other abnormalities of gait and mobility: Secondary | ICD-10-CM | POA: Diagnosis not present

## 2016-02-20 DIAGNOSIS — I10 Essential (primary) hypertension: Secondary | ICD-10-CM | POA: Diagnosis not present

## 2016-02-20 DIAGNOSIS — I519 Heart disease, unspecified: Secondary | ICD-10-CM | POA: Diagnosis not present

## 2016-02-20 DIAGNOSIS — E1140 Type 2 diabetes mellitus with diabetic neuropathy, unspecified: Secondary | ICD-10-CM | POA: Diagnosis not present

## 2016-02-20 DIAGNOSIS — Z7984 Long term (current) use of oral hypoglycemic drugs: Secondary | ICD-10-CM | POA: Diagnosis not present

## 2016-02-20 DIAGNOSIS — G4733 Obstructive sleep apnea (adult) (pediatric): Secondary | ICD-10-CM | POA: Diagnosis not present

## 2016-02-20 DIAGNOSIS — Z0000 Encounter for general adult medical examination without abnormal findings: Secondary | ICD-10-CM | POA: Diagnosis not present

## 2016-02-23 DIAGNOSIS — E669 Obesity, unspecified: Secondary | ICD-10-CM | POA: Diagnosis not present

## 2016-02-23 DIAGNOSIS — E7800 Pure hypercholesterolemia, unspecified: Secondary | ICD-10-CM | POA: Diagnosis not present

## 2016-02-23 DIAGNOSIS — E1165 Type 2 diabetes mellitus with hyperglycemia: Secondary | ICD-10-CM | POA: Diagnosis not present

## 2016-02-23 DIAGNOSIS — I10 Essential (primary) hypertension: Secondary | ICD-10-CM | POA: Diagnosis not present

## 2016-02-23 DIAGNOSIS — G609 Hereditary and idiopathic neuropathy, unspecified: Secondary | ICD-10-CM | POA: Diagnosis not present

## 2016-03-09 DIAGNOSIS — L02611 Cutaneous abscess of right foot: Secondary | ICD-10-CM | POA: Diagnosis not present

## 2016-03-09 DIAGNOSIS — L03031 Cellulitis of right toe: Secondary | ICD-10-CM | POA: Diagnosis not present

## 2016-03-09 DIAGNOSIS — M79674 Pain in right toe(s): Secondary | ICD-10-CM | POA: Diagnosis not present

## 2016-03-09 DIAGNOSIS — L03032 Cellulitis of left toe: Secondary | ICD-10-CM | POA: Diagnosis not present

## 2016-03-16 ENCOUNTER — Telehealth: Payer: Self-pay | Admitting: Cardiology

## 2016-03-16 DIAGNOSIS — H401211 Low-tension glaucoma, right eye, mild stage: Secondary | ICD-10-CM | POA: Diagnosis not present

## 2016-03-16 DIAGNOSIS — H401222 Low-tension glaucoma, left eye, moderate stage: Secondary | ICD-10-CM | POA: Diagnosis not present

## 2016-03-16 DIAGNOSIS — H353110 Nonexudative age-related macular degeneration, right eye, stage unspecified: Secondary | ICD-10-CM | POA: Diagnosis not present

## 2016-03-16 DIAGNOSIS — H40033 Anatomical narrow angle, bilateral: Secondary | ICD-10-CM | POA: Diagnosis not present

## 2016-03-16 NOTE — Telephone Encounter (Signed)
Walk in pt form-[facility]-paper dropped off Pam/[place] Back Thursday.

## 2016-03-23 DIAGNOSIS — L03031 Cellulitis of right toe: Secondary | ICD-10-CM | POA: Diagnosis not present

## 2016-04-01 ENCOUNTER — Encounter: Payer: Self-pay | Admitting: *Deleted

## 2016-04-01 ENCOUNTER — Encounter: Payer: Medicare Other | Attending: Family Medicine | Admitting: *Deleted

## 2016-04-01 VITALS — Ht 59.0 in | Wt 236.9 lb

## 2016-04-01 DIAGNOSIS — Z713 Dietary counseling and surveillance: Secondary | ICD-10-CM | POA: Diagnosis not present

## 2016-04-01 DIAGNOSIS — Z794 Long term (current) use of insulin: Secondary | ICD-10-CM | POA: Insufficient documentation

## 2016-04-01 DIAGNOSIS — E118 Type 2 diabetes mellitus with unspecified complications: Secondary | ICD-10-CM | POA: Insufficient documentation

## 2016-04-01 DIAGNOSIS — E119 Type 2 diabetes mellitus without complications: Secondary | ICD-10-CM

## 2016-04-01 NOTE — Progress Notes (Signed)
  Medical Nutrition Therapy:  Appt start time: 1100 end time:  1130.  Assessment:  Primary concerns today 04/01/16: patient here for diabetes education and help with obesity follow up visit.  We will review [facility] insulin pump Reports today. She is pleased with her weight loss of 4.5 pounds since last visit and states she is going to the gym with her Dad 2 days a week again. She is looking forward to a family wedding this weekend as there will be family members she hasn't seen in a long time. She states she is eating more fresh fruits and vegetables and is pleased with her increase in activity level.   Patient states her last A1c: lowered from 7.8% to 7.2% in April, 2017!  I uploaded her pump to [facility] and reviewed the following reports:        MEDICATIONS: see list. Currently on Medtronic insulin pump. MD has added Metformin and increase the Invokana dose from 100 to 300 mg  Progress Towards Goal(s):  In progress.   Nutritional Diagnosis:  NB-1.1 Food and nutrition-related knowledge deficit As related to carb counting and efficient use of insulin pump.  States her last A1c at MD office is down to 7.4%     Intervention: Based on the [facility] Reports, Her FBG are stable. Average BG per this report is 184 mg/dl and the standard deviation is now 44 mg/dl. Continued weight loss noted. I commended her with her increased activity level. She expressed understanding and interest in being active several times every day to increase her fitness level and be able to do more intense exercises in the future.   Plan: Congratulations on your weight loss of 4.5 pounds this visit! When looking at the [place] your average BG is still fairly stable @ 184 +/- 44 mg/dl all of which is good  Continue to use Bolus Wizard with every meal Continue checking BG before the meal and giving Correction Bolus first, then entering food for Meal Bolus after Correction has been delivered. Consider aiming for  3 Carb Choices per meal (45 grams) +/- 1 either way  Continue with the fresh fruits and vegetables  Aim for 0-2 Carbs per snack if hungry  Continue with your exercise at the gym twice a week as tolerated  Do not give insulin for wine when you go to the wedding this weekend    Handouts given during visit include:none today  Monitoring/Evaluation:  Dietary intake, exercise, bolus wizard, and body weight in 2-3 months.

## 2016-04-01 NOTE — Patient Instructions (Signed)
Plan: Congratulations on your weight loss of 4.5 pounds this visit! When looking at the [place] your average BG is fairly stable @ 184 +/- 44 mg/dl all of which is good  Continue to use Bolus Wizard with every meal Continue checking BG before the meal and giving Correction Bolus first, then entering food for Meal Bolus after Correction has been delivered. Consider aiming for 3 Carb Choices per meal (45 grams) +/- 1 either way  Continue with the fresh fruits and vegetables  Aim for 0-2 Carbs per snack if hungry  Continue with your exercise at the gym twice a week as tolerated  Do not give insulin for wine when you go to the wedding this weekend

## 2016-04-06 ENCOUNTER — Other Ambulatory Visit: Payer: Self-pay | Admitting: *Deleted

## 2016-04-06 ENCOUNTER — Telehealth: Payer: Self-pay | Admitting: Cardiology

## 2016-04-06 DIAGNOSIS — L03031 Cellulitis of right toe: Secondary | ICD-10-CM | POA: Diagnosis not present

## 2016-04-06 DIAGNOSIS — L03032 Cellulitis of left toe: Secondary | ICD-10-CM | POA: Diagnosis not present

## 2016-04-06 MED ORDER — SPIRONOLACTONE 25 MG PO TABS: ORAL_TABLET | ORAL | Status: DC

## 2016-04-06 NOTE — Telephone Encounter (Signed)
New message    *STAT* If patient is at the pharmacy, call can be transferred to refill team.   1. Which medications need to be refilled? (please list name of each medication and dose if known) spironolactone  25 mg   2. Which pharmacy/location (including street and city if local pharmacy) is medication to be sent to?[facility] to patient home   3. Do they need a 30 day or 90 day supply? 90 days

## 2016-04-15 ENCOUNTER — Other Ambulatory Visit: Payer: Self-pay | Admitting: *Deleted

## 2016-05-03 ENCOUNTER — Other Ambulatory Visit: Payer: Self-pay

## 2016-05-03 MED ORDER — FUROSEMIDE 40 MG PO TABS: 40.0000 mg | ORAL_TABLET | Freq: Every day | ORAL | Status: DC

## 2016-05-03 MED ORDER — LOSARTAN POTASSIUM 100 MG PO TABS
100.0000 mg | ORAL_TABLET | Freq: Every day | ORAL | Status: DC
Start: 2016-05-03 — End: 2016-07-11

## 2016-05-03 NOTE — Telephone Encounter (Signed)
Jerline Pain, MD at 11/26/2015 10:00 AM  losartan (COZAAR) 100 MG tabletTake 1 tablet (100 mg total) by mouth daily furosemide (LASIX) 40 MG tabletTAKE 1 TABLET BY MOUTH EVERY DAY Patient Instructions     Medication Instructions:  The current medical regimen is effective; continue present plan and medications.

## 2016-05-10 DIAGNOSIS — E1351 Other specified diabetes mellitus with diabetic peripheral angiopathy without gangrene: Secondary | ICD-10-CM | POA: Diagnosis not present

## 2016-05-10 DIAGNOSIS — L602 Onychogryphosis: Secondary | ICD-10-CM | POA: Diagnosis not present

## 2016-05-25 ENCOUNTER — Ambulatory Visit (INDEPENDENT_AMBULATORY_CARE_PROVIDER_SITE_OTHER): Payer: Medicare Other | Admitting: Cardiology

## 2016-05-25 ENCOUNTER — Encounter: Payer: Self-pay | Admitting: Cardiology

## 2016-05-25 DIAGNOSIS — I10 Essential (primary) hypertension: Secondary | ICD-10-CM

## 2016-05-25 DIAGNOSIS — Z79899 Other long term (current) drug therapy: Secondary | ICD-10-CM

## 2016-05-25 NOTE — Progress Notes (Signed)
[place]. [address], [address] [place], [place]  [address] Phone: [phone] Fax:  [phone]  Date:  05/25/2016   ID:  Jessica [name], Jessica [name] 08-Nov-1945, MRN [id number]  PCP:  Cari Caraway, MD   History of Present Illness: Jessica [name] is a 70 y.o. female with hx of obesity, hypertension, and hyperlipidemia. 02/23/13 here due to shortness of breath.. In 2010, cardiac catheterization showed no evidence of coronary artery disease.   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 Increased stress.   Occasionally she will feel cramping like sensation in her left flank/abdomen. She describes this chest pain however the location is more upper abdominal left-sided. She continues to have shortness of breath with activity which is typical for her.    Echocardiogram was performed EF 65-70%, no WMA, but increase LA pressures.  She uses CPAP nightly. During her sleep study by Dr. Constance Holster, she did have pauses she states.  Seen Jessica [name] at nutrition.   Sister lost weight to 204.    Prior vertigo. Cataract surgery. Tachycardia noted with ambulation.  Her father was featured in the life section of the news and record on 04/02/2016. He is 68 years old.  Wt Readings from Last 3 Encounters:  05/25/16 238 lb 9.6 oz (108.2 kg)  04/01/16 236 lb 14.4 oz (107.5 kg)  01/29/16 241 lb 4.8 oz (109.5 kg)     Past Medical History:  Diagnosis Date  . Diabetes mellitus without complication ([place])   . Diastolic dysfunction   . Hyperlipidemia   . Hypertension   . Obesity   . Sleep apnea     Past Surgical History:  Procedure Laterality Date  . CARDIAC CATHETERIZATION     -6/09-normal ejection fraction 65%, no angiographically significant CAD., M. Skains MD  . CESAREAN SECTION    . foot spurs removed    . JOINT REPLACEMENT    . knee spurs removed    . shoulder spurs removed    . TONSILLECTOMY AND ADENOIDECTOMY      Current Outpatient Prescriptions  Medication Sig Dispense Refill  .  amoxicillin (AMOXIL) 500 MG capsule TAKE 4 TABLETS BY MOUTH 1 HOUR BEFORE APPOINTMENT  12  . aspirin 81 MG tablet Take 81 mg by mouth daily.    . B-D INS SYRINGE 0.5CC/31GX5/16 31G X 5/16" 0.5 ML MISC Use as directed per provider.    . beta carotene w/minerals (OCUVITE) tablet Take 1 tablet by mouth daily. With Zeaxanthin    . Calcium Carbonate-Vitamin D 600-200 MG-UNIT TABS Take 1 tablet by mouth daily.     . cholecalciferol (VITAMIN D) 1000 UNITS tablet Take 1,000 Units by mouth 2 (two) times daily.     . clobetasol cream (TEMOVATE) 0.05 % APPLY TO AFFECTED AREAS 1 TO 2 TIMES DAILY NOT TO FACE,GROIN, OR ARMPIT  3  . Coenzyme Q10 (CO Q-10) 100 MG CAPS Take 1 capsule by mouth daily.     . dorzolamide-timolol (COSOPT) 22.3-6.8 MG/ML ophthalmic solution Place 1 drop into both eyes 2 (two) times daily.   4  . furosemide (LASIX) 40 MG tablet Take 1 tablet (40 mg total) by mouth daily. 90 tablet 0  . Garlic 123XX123 MG CAPS Take 1 capsule by mouth daily.     . insulin lispro (HUMALOG) 100 UNIT/ML injection Inject into the skin 3 (three) times daily before meals.    . INVOKANA 300 MG TABS tablet Take 300 mg by mouth daily.    . Iron-Vitamin C (  VITRON-C) 65-125 MG TABS Take 1 tablet by mouth daily.     Marland Kitchen losartan (COZAAR) 100 MG tablet Take 1 tablet (100 mg total) by mouth daily. 90 tablet 0  . metFORMIN (GLUCOPHAGE-XR) 500 MG 24 hr tablet Take 500 mg by mouth daily with supper.    . metoprolol (LOPRESSOR) 100 MG tablet TAKE 1 TABLET BY MOUTH TWICE A DAY 180 tablet 3  . Multiple Vitamins-Minerals (CENTRUM SILVER PO) Take 1 tablet by mouth daily.     . ONE TOUCH ULTRA TEST test strip 1 each by Other route as needed (Use as directed per provider).   11  . penicillin v potassium (VEETID) 500 MG tablet Take 500 mg by mouth 4 (four) times daily.   0  . simvastatin (ZOCOR) 40 MG tablet Take 40 mg by mouth every evening.    Marland Kitchen spironolactone (ALDACTONE) 25 MG tablet TAKE 1 TABLET (25 MG TOTAL) BY MOUTH DAILY. 90  tablet 2  . Travoprost, BAK Free, (TRAVATAN) 0.004 % SOLN ophthalmic solution Place 1 drop into both eyes at bedtime.      No current facility-administered medications for this visit.     Allergies:    Allergies  Allergen Reactions  . Codeine   . Iodine     Social History:  The patient  reports that she has never smoked. She has never used smokeless tobacco. She reports that she does not drink alcohol.   ROS:  Please see the history of present illness.   Denies any fevers, chills, orthopnea, PND.   PHYSICAL EXAM: VS:  BP 136/74   Pulse 82   Ht 4\' 11"  (1.499 m)   Wt 238 lb 9.6 oz (108.2 kg)   BMI 48.19 kg/m  Well nourished, well developed, in no acute distress HEENT: normal Neck: no JVD Cardiac:  normal S1, S2; RRR; no murmur Lungs:  clear to auscultation bilaterally, no wheezing, rhonchi or rales Abd: soft, nontender, no hepatomegalyObese Ext: ankle edema and chronic leg edema.  Skin: warm and dry Neuro: no focal abnormalities noted  EKG-11/26/15-sinus rhythm, 77, no other abnormalities personally viewed-prior 03/06/14-normal rhythm, 79, poor R wave progression, no other abnormalities.     ASSESSMENT AND PLAN:   1. Morbid obesity-once again encourage weight loss. Low carbohydrate diet. She is doing a good job of weight loss. Continue. 2. Hypertension-improved control. Multidrug regimen reviewed. Continue to monitor blood pressures at home. 3. Prior hyperkalemia- On Aldactone. She seems to be tolerating this very well. 4. Atypical left sided chest pain-chronic. Likely musculoskeletal/flank pain 5. Diabetes-per primary team. Labs at Dr. Chalmers Cater. Pump 6. Hyperlipidemia-continue with simvastatin especially with diabetes. Excellent.  7. We will see back in 12 months.  Signed, Candee Furbish, MD [facility]  05/25/2016 1:44 PM

## 2016-05-25 NOTE — Patient Instructions (Signed)

## 2016-05-27 DIAGNOSIS — I10 Essential (primary) hypertension: Secondary | ICD-10-CM | POA: Diagnosis not present

## 2016-05-27 DIAGNOSIS — E7800 Pure hypercholesterolemia, unspecified: Secondary | ICD-10-CM | POA: Diagnosis not present

## 2016-05-27 DIAGNOSIS — E1165 Type 2 diabetes mellitus with hyperglycemia: Secondary | ICD-10-CM | POA: Diagnosis not present

## 2016-05-27 DIAGNOSIS — G609 Hereditary and idiopathic neuropathy, unspecified: Secondary | ICD-10-CM | POA: Diagnosis not present

## 2016-05-31 ENCOUNTER — Other Ambulatory Visit: Payer: Self-pay | Admitting: *Deleted

## 2016-05-31 MED ORDER — METOPROLOL TARTRATE 100 MG PO TABS: 100.0000 mg | ORAL_TABLET | Freq: Two times a day (BID) | ORAL | 3 refills | Status: DC

## 2016-06-03 ENCOUNTER — Encounter: Payer: Medicare Other | Attending: Family Medicine | Admitting: *Deleted

## 2016-06-03 DIAGNOSIS — E118 Type 2 diabetes mellitus with unspecified complications: Secondary | ICD-10-CM | POA: Insufficient documentation

## 2016-06-03 DIAGNOSIS — E119 Type 2 diabetes mellitus without complications: Secondary | ICD-10-CM

## 2016-06-03 DIAGNOSIS — Z713 Dietary counseling and surveillance: Secondary | ICD-10-CM | POA: Diagnosis not present

## 2016-06-03 DIAGNOSIS — Z794 Long term (current) use of insulin: Secondary | ICD-10-CM | POA: Insufficient documentation

## 2016-06-03 NOTE — Progress Notes (Signed)
Medical Nutrition Therapy:  Appt start time: 1130 end time:  1200.  Assessment:  Primary concerns today 06/03/16: patient here for diabetes education and help with obesity follow up visit. We will review [facility] insulin pump Reports today per patient tequest.Weight stable this visit, she has had family visiting and her niece just got married. since last visit. Her sister has been taking her Dad to the gym so she hasn't exercised as much and she sees this as important to continued weight loss in the future.  She continues eating more fresh fruits and vegetables and is pleased with her improved A1c from 7.2 to 7.1%.   Patient states her last A1c: lowered from 7.2% to 7.1%   I uploaded her pump to [facility] and reviewed the following reports:        MEDICATIONS: see list. Currently on Medtronic insulin pump. MD has added Metformin and increase the Invokana dose from 100 to 300 mg  Progress Towards Goal(s):  In progress.   Nutritional Diagnosis:  NB-1.1 Food and nutrition-related knowledge deficit As related to carb counting and efficient use of insulin pump.  States her last A1c at MD office is down to 7.4%     Intervention: Based on the [facility] Reports, Her FBG are stable. Average BG per this report is again lowered to 169 mg/dl and the standard deviation is stable at 48 mg/dl. Continued weight loss encouraged.   Plan: Continue to use Bolus Wizard with every meal Continue checking BG before the meal and giving Correction Bolus first, then entering food for Meal Bolus after Correction has been delivered. Consider aiming for 3 Carb Choices per meal (45 grams) +/- 1 either way  Continue with the fresh fruits and vegetables  Aim for 0-2 Carbs per snack if hungry  Resume exercise at the gym at least twice a week as tolerated   Handouts given during visit include:none today  Monitoring/Evaluation:  Dietary intake, exercise, bolus wizard, and body weight in 2-3 months.

## 2016-06-03 NOTE — Patient Instructions (Signed)
Plan: Continue to use Bolus Wizard with every meal Continue checking BG before the meal and giving Correction Bolus first, then entering food for Meal Bolus after Correction has been delivered. Consider aiming for 3 Carb Choices per meal (45 grams) +/- 1 either way  Continue with the fresh fruits and vegetables  Aim for 0-2 Carbs per snack if hungry  Resume exercise at the gym at least twice a week as tolerated

## 2016-07-08 DIAGNOSIS — Z1231 Encounter for screening mammogram for malignant neoplasm of breast: Secondary | ICD-10-CM | POA: Diagnosis not present

## 2016-07-11 ENCOUNTER — Other Ambulatory Visit: Payer: Self-pay | Admitting: Cardiology

## 2016-07-13 DIAGNOSIS — L602 Onychogryphosis: Secondary | ICD-10-CM | POA: Diagnosis not present

## 2016-07-13 DIAGNOSIS — E1351 Other specified diabetes mellitus with diabetic peripheral angiopathy without gangrene: Secondary | ICD-10-CM | POA: Diagnosis not present

## 2016-07-15 DIAGNOSIS — R921 Mammographic calcification found on diagnostic imaging of breast: Secondary | ICD-10-CM | POA: Diagnosis not present

## 2016-08-05 ENCOUNTER — Encounter: Payer: Medicare Other | Attending: Family Medicine | Admitting: *Deleted

## 2016-08-05 DIAGNOSIS — E118 Type 2 diabetes mellitus with unspecified complications: Secondary | ICD-10-CM | POA: Diagnosis not present

## 2016-08-05 DIAGNOSIS — Z794 Long term (current) use of insulin: Secondary | ICD-10-CM | POA: Insufficient documentation

## 2016-08-05 DIAGNOSIS — Z713 Dietary counseling and surveillance: Secondary | ICD-10-CM | POA: Insufficient documentation

## 2016-08-05 DIAGNOSIS — E119 Type 2 diabetes mellitus without complications: Secondary | ICD-10-CM

## 2016-08-05 NOTE — Patient Instructions (Signed)
Plan: Continue to use Bolus Wizard with every meal Continue checking BG before the meal and giving Correction Bolus first, then entering food for Meal Bolus after Correction has been delivered. Consider aiming for 3 Carb Choices per meal (45 grams) +/- 1 either way  Continue with the fresh fruits and vegetables  Aim for 0-2 Carbs per snack if hungry  Continue with your exerciise at the gym at least two or three times a week, congratulations! I'm very proud of you!!!

## 2016-08-30 DIAGNOSIS — E7800 Pure hypercholesterolemia, unspecified: Secondary | ICD-10-CM | POA: Diagnosis not present

## 2016-08-30 DIAGNOSIS — I10 Essential (primary) hypertension: Secondary | ICD-10-CM | POA: Diagnosis not present

## 2016-08-30 DIAGNOSIS — G609 Hereditary and idiopathic neuropathy, unspecified: Secondary | ICD-10-CM | POA: Diagnosis not present

## 2016-08-30 DIAGNOSIS — Z23 Encounter for immunization: Secondary | ICD-10-CM | POA: Diagnosis not present

## 2016-08-30 DIAGNOSIS — E1165 Type 2 diabetes mellitus with hyperglycemia: Secondary | ICD-10-CM | POA: Diagnosis not present

## 2016-08-30 DIAGNOSIS — E669 Obesity, unspecified: Secondary | ICD-10-CM | POA: Diagnosis not present

## 2016-09-06 NOTE — Progress Notes (Signed)
Medical Nutrition Therapy:  Appt start time: 1200 end time:  1230.  Assessment:  Primary concerns today 08/05/16: patient here for diabetes education and help with obesity follow up visit. We will review [facility] insulin pump Reports today per patient tequest.Weight stable this visit. Her visits to the gym have decreased this past month due to her Dad and herself being sick with flu like symptoms. Her plan is to go to [facility] with her friend and again with her Dad  She continues eating more fresh fruits and vegetables and is still pleased  is her improved A1c at 7.2%.  Patient states her last A1c: 7.2%   MEDICATIONS: see list. Currently on Medtronic insulin pump. MD has added Metformin and increase the Invokana dose from 100 to 300 mg  Progress Towards Goal(s):  In progress.   Nutritional Diagnosis:  NB-1.1 Food and nutrition-related knowledge deficit As related to carb counting and efficient use of insulin pump.  States her last A1c at MD office is down to 7.1%     Intervention: Based on the [facility] Reports, her FBG are stable. Average BG per this report is 179 mg/dl and the standard deviation is stable at 52g/dl. Increase is probably due to illness and decreased activity over the past month. Continued weight loss encouraged.   Plan: Continue to use Bolus Wizard with every meal Continue checking BG before the meal and giving Correction Bolus first, then entering food for Meal Bolus after Correction has been delivered. Consider aiming for 3 Carb Choices per meal (45 grams) +/- 1 either way  Continue with the fresh fruits and vegetables  Aim for 0-2 Carbs per snack if hungry  Resume exercise at the gym at least twice a week as tolerated   Handouts given during visit include:Diabetes Medication handout  Monitoring/Evaluation:  Dietary intake, exercise, bolus wizard, and body weight in 2-3 months.

## 2016-09-30 DIAGNOSIS — L602 Onychogryphosis: Secondary | ICD-10-CM | POA: Diagnosis not present

## 2016-09-30 DIAGNOSIS — E1351 Other specified diabetes mellitus with diabetic peripheral angiopathy without gangrene: Secondary | ICD-10-CM | POA: Diagnosis not present

## 2016-10-07 ENCOUNTER — Encounter: Payer: Medicare Other | Attending: Family Medicine | Admitting: *Deleted

## 2016-10-07 DIAGNOSIS — Z713 Dietary counseling and surveillance: Secondary | ICD-10-CM | POA: Diagnosis not present

## 2016-10-07 DIAGNOSIS — E118 Type 2 diabetes mellitus with unspecified complications: Secondary | ICD-10-CM | POA: Insufficient documentation

## 2016-10-07 DIAGNOSIS — Z794 Long term (current) use of insulin: Secondary | ICD-10-CM | POA: Diagnosis not present

## 2016-10-07 DIAGNOSIS — E119 Type 2 diabetes mellitus without complications: Secondary | ICD-10-CM

## 2016-10-07 NOTE — Progress Notes (Signed)
Medical Nutrition Therapy:  Appt start time: [id number] end time:  [id number].  Assessment:  Primary concerns today 10/07/16: patient here for diabetes education and help with obesity follow up visit. We will review [facility] insulin pump Reports today per patient tequest.Weight is up this visit, although she states she has been sick off and on since Thanksgiving and has not been able to exercise.   Patient states her last A1c: 7.2%  I uploaded her pump to [facility] and reviewed the following reports:          MEDICATIONS: see list. Currently on Medtronic insulin pump. MD has added Metformin and increase the Invokana dose from 100 to 300 mg  Progress Towards Goal(s):  In progress.   Nutritional Diagnosis:  NB-1.1 Food and nutrition-related knowledge deficit As related to carb counting and efficient use of insulin pump.  States her last A1c at MD office is  7.2%     Intervention: Based on the [facility] Reports, her FBG are still stable. Average BG per this report is  168 mg/dl and the standard deviation is 51 mg/dl. Continued weight loss encouraged. I provided Sick Day Rules and encouraged she get back to the gym when she is feeling better  Plan: Continue to use Bolus Wizard with every meal Continue checking BG before the meal and giving Correction Bolus first, then entering food for Meal Bolus after Correction has been delivered. Consider aiming for 3 Carb Choices per meal (45 grams) +/- 1 either way  Continue with the fresh fruits and vegetables  Aim for 0-2 Carbs per snack if hungry  Resume exercise at the gym at least twice a week as tolerated when feeling better  Handouts given during visit include:Sick Day Rules  Monitoring/Evaluation:  Dietary intake, exercise, bolus wizard, and body weight in 2-3 months.

## 2016-11-19 DIAGNOSIS — L089 Local infection of the skin and subcutaneous tissue, unspecified: Secondary | ICD-10-CM | POA: Diagnosis not present

## 2016-11-29 DIAGNOSIS — L0231 Cutaneous abscess of buttock: Secondary | ICD-10-CM | POA: Diagnosis not present

## 2016-12-08 ENCOUNTER — Ambulatory Visit: Payer: Medicare Other | Admitting: *Deleted

## 2016-12-09 ENCOUNTER — Other Ambulatory Visit: Payer: Self-pay | Admitting: Cardiology

## 2016-12-13 DIAGNOSIS — L0231 Cutaneous abscess of buttock: Secondary | ICD-10-CM | POA: Diagnosis not present

## 2016-12-15 DIAGNOSIS — L0231 Cutaneous abscess of buttock: Secondary | ICD-10-CM | POA: Diagnosis not present

## 2016-12-16 ENCOUNTER — Encounter: Payer: Medicare Other | Attending: Family Medicine | Admitting: *Deleted

## 2016-12-16 DIAGNOSIS — Z713 Dietary counseling and surveillance: Secondary | ICD-10-CM | POA: Insufficient documentation

## 2016-12-16 DIAGNOSIS — Z794 Long term (current) use of insulin: Secondary | ICD-10-CM | POA: Insufficient documentation

## 2016-12-16 DIAGNOSIS — E119 Type 2 diabetes mellitus without complications: Secondary | ICD-10-CM

## 2016-12-16 DIAGNOSIS — E118 Type 2 diabetes mellitus with unspecified complications: Secondary | ICD-10-CM | POA: Diagnosis not present

## 2016-12-16 DIAGNOSIS — E1351 Other specified diabetes mellitus with diabetic peripheral angiopathy without gangrene: Secondary | ICD-10-CM | POA: Diagnosis not present

## 2016-12-16 DIAGNOSIS — L602 Onychogryphosis: Secondary | ICD-10-CM | POA: Diagnosis not present

## 2016-12-16 NOTE — Progress Notes (Signed)
Medical Nutrition Therapy:  Appt start time: 1100 end time:  1130.  Assessment:  Primary concerns today 12/16/2016: patient here for diabetes education and help with obesity follow up visit. We will review [facility] insulin pump Reports today per patient request. She has had a hematoma removed yesterday and she has been on antibiotics for that over the last few weeks. Weight is stable today at 242.8 lb. She sees Dr. Chalmers Cater next week for her next A1c results. Not able to exercise yet due to illness and the hematoma infection.   Patient states her last A1c: 7.2%  I uploaded her pump to [facility] and reviewed the following reports:          MEDICATIONS: see list. Currently on Medtronic insulin pump. MD has added Metformin and increased the Invokana dose from 100 to 300 mg  Progress Towards Goal(s):  In progress.   Nutritional Diagnosis:  NB-1.1 Food and nutrition-related knowledge deficit As related to carb counting and efficient use of insulin pump.  States her last A1c at MD office is  7.2%     Intervention: Based on the [facility] Reports, her FBG are still stable but creeping up. Average BG per this report is  158 mg/dl and the standard deviation is 55 mg/dl. Continued weight loss encouraged. I encouraged her to get back to the gym when she is feeling better  Plan: Continue to use Bolus Wizard with every meal Continue checking BG before the meal and giving Correction Bolus first, then entering food for Meal Bolus after Correction has been delivered. Consider aiming for 3 Carb Choices per meal (45 grams) +/- 1 either way  Continue with the fresh fruits and vegetables  Aim for 0-2 Carbs per snack if hungry  Resume exercise at the gym at least twice a week as tolerated when feeling better  Handouts given during visit include:no new handouts today  Monitoring/Evaluation:  Dietary intake, exercise, bolus wizard, and body weight in 3 months.

## 2016-12-29 DIAGNOSIS — G609 Hereditary and idiopathic neuropathy, unspecified: Secondary | ICD-10-CM | POA: Diagnosis not present

## 2016-12-29 DIAGNOSIS — E1165 Type 2 diabetes mellitus with hyperglycemia: Secondary | ICD-10-CM | POA: Diagnosis not present

## 2016-12-29 DIAGNOSIS — E7800 Pure hypercholesterolemia, unspecified: Secondary | ICD-10-CM | POA: Diagnosis not present

## 2016-12-29 DIAGNOSIS — I10 Essential (primary) hypertension: Secondary | ICD-10-CM | POA: Diagnosis not present

## 2017-01-10 ENCOUNTER — Other Ambulatory Visit: Payer: Self-pay | Admitting: *Deleted

## 2017-01-10 MED ORDER — SPIRONOLACTONE 25 MG PO TABS: 25.0000 mg | ORAL_TABLET | Freq: Every day | ORAL | 1 refills | Status: DC

## 2017-01-18 DIAGNOSIS — R921 Mammographic calcification found on diagnostic imaging of breast: Secondary | ICD-10-CM | POA: Diagnosis not present

## 2017-02-24 DIAGNOSIS — S8011XA Contusion of right lower leg, initial encounter: Secondary | ICD-10-CM | POA: Diagnosis not present

## 2017-02-24 DIAGNOSIS — R29898 Other symptoms and signs involving the musculoskeletal system: Secondary | ICD-10-CM | POA: Diagnosis not present

## 2017-02-24 DIAGNOSIS — E782 Mixed hyperlipidemia: Secondary | ICD-10-CM | POA: Diagnosis not present

## 2017-02-24 DIAGNOSIS — R296 Repeated falls: Secondary | ICD-10-CM | POA: Diagnosis not present

## 2017-02-28 DIAGNOSIS — G609 Hereditary and idiopathic neuropathy, unspecified: Secondary | ICD-10-CM | POA: Diagnosis not present

## 2017-02-28 DIAGNOSIS — I10 Essential (primary) hypertension: Secondary | ICD-10-CM | POA: Diagnosis not present

## 2017-02-28 DIAGNOSIS — E669 Obesity, unspecified: Secondary | ICD-10-CM | POA: Diagnosis not present

## 2017-02-28 DIAGNOSIS — E1165 Type 2 diabetes mellitus with hyperglycemia: Secondary | ICD-10-CM | POA: Diagnosis not present

## 2017-02-28 DIAGNOSIS — E7800 Pure hypercholesterolemia, unspecified: Secondary | ICD-10-CM | POA: Diagnosis not present

## 2017-03-04 ENCOUNTER — Encounter: Payer: Self-pay | Admitting: Physical Therapy

## 2017-03-04 ENCOUNTER — Ambulatory Visit: Payer: Medicare Other | Attending: Family Medicine | Admitting: Physical Therapy

## 2017-03-04 DIAGNOSIS — R296 Repeated falls: Secondary | ICD-10-CM | POA: Diagnosis not present

## 2017-03-04 DIAGNOSIS — M25551 Pain in right hip: Secondary | ICD-10-CM | POA: Diagnosis not present

## 2017-03-04 DIAGNOSIS — M6281 Muscle weakness (generalized): Secondary | ICD-10-CM | POA: Insufficient documentation

## 2017-03-04 NOTE — Therapy (Signed)
[place], [place], [address] Phone: [phone]   Fax:  [phone]  Physical Therapy Evaluation  Patient Details  Name: Jessica [name] MRN: [id number] Date of Birth: [birth year] Referring Provider: W. Addison Lank  Encounter Date: 03/04/2017      PT End of Session - 03/04/17 1006    Visit Number 1   Date for PT Re-Evaluation 05/04/17   PT Start Time 0935   PT Stop Time 1017   PT Time Calculation (min) 42 min   Activity Tolerance Patient tolerated treatment well   Behavior During Therapy [facility] for tasks assessed/performed      Past Medical History:  Diagnosis Date  . Diabetes mellitus without complication ([place])   . Diastolic dysfunction   . Hyperlipidemia   . Hypertension   . Obesity   . Sleep apnea     Past Surgical History:  Procedure Laterality Date  . CARDIAC CATHETERIZATION     -6/09-normal ejection fraction 65%, no angiographically significant CAD., M. Skains MD  . CESAREAN SECTION    . foot spurs removed    . JOINT REPLACEMENT    . knee spurs removed    . shoulder spurs removed    . TONSILLECTOMY AND ADENOIDECTOMY      There were no vitals filed for this visit.       Subjective Assessment - 03/04/17 0939    Subjective Patient reports that she has had at least 3 falls in the past 6 months.  She reports that she has been having increased difficulty getting up from sitting.  She reports that she has a high level of stress because she cares for her 52 year old dad.     Limitations Lifting;Standing   Patient Stated Goals have no falls   Currently in Pain? Yes   Pain Score 5    Pain Location Hip   Pain Orientation Right   Pain Type Chronic pain   Pain Radiating Towards pain down leg and buttock   Pain Onset More than a month ago   Pain Frequency Intermittent   Aggravating Factors  worse at night   Pain Relieving Factors moving            [facility] PT Assessment -  03/04/17 0001      Assessment   Medical Diagnosis falls, weakness   Referring Provider Amedeo Gory   Onset Date/Surgical Date 02/11/17   Prior Therapy for the knee replacement and falls about 2 years ago     Precautions   Precautions [name]   Has the patient fallen in the past 6 months Yes   How many times? 3   Has the patient had a decrease in activity level because of a fear of falling?  Yes   Is the patient reluctant to leave their home because of a fear of falling?  Yes     Home Environment   Additional Comments has a ramp, does housework, cooks     Prior Function   Level of Independence Independent with household mobility with device  was using cane now a 4WW   Vocation Retired   Leisure no exercise     ROM / Strength   AROM / PROM / Strength AROM;Strength     AROM   Overall AROM Comments AROM of the knees was 8-90 degrees flexion, AROM of hip flexion right 60 degrees, left 80 degrees     Strength  Overall Strength Comments Left leg 4/5, right hip 3+/5, right knee 4-/5, ankles 4/5     Palpation   Palpation comment very tender and tight in the right ITB and buttock     Ambulation/Gait   Gait Comments uses a 4WW over the past month, 150' max slow, antalgic on the right     Standardized Balance Assessment   Standardized Balance Assessment Timed Up and Go Test;Berg Balance Test     Berg Balance Test   Sit to Stand Able to stand without using hands and stabilize independently   Standing Unsupported Able to stand safely 2 minutes   Sitting with Back Unsupported but Feet Supported on Floor or Stool Able to sit safely and securely 2 minutes   Stand to Sit Controls descent by using hands   Transfers Able to transfer safely, definite need of hands   Standing Unsupported with Eyes Closed Able to stand 10 seconds with supervision   Standing Ubsupported with Feet Together Able to place feet together independently but unable to hold for 30 seconds   From  Standing, Reach Forward with Outstretched Arm Can reach forward >12 cm safely (5")   From Standing Position, Pick up Object from Floor Able to pick up shoe, needs supervision   From Standing Position, Turn to Look Behind Over each Shoulder Turn sideways only but maintains balance   Turn 360 Degrees Able to turn 360 degrees safely but slowly   Standing Unsupported, Alternately Place Feet on Step/Stool Able to complete 4 steps without aid or supervision   Standing Unsupported, One Foot in Front Able to take small step independently and hold 30 seconds   Standing on One Leg Tries to lift leg/unable to hold 3 seconds but remains standing independently   Total Score 38     Timed Up and Go Test   Normal TUG (seconds) 25                           PT Education - 03/04/17 1005    Education provided Yes   Education Details HEP for 3 way kicks, marching, side stepping and single leg stance   Person(s) Educated Patient   Methods Explanation;Demonstration;Handout   Comprehension Returned demonstration;Verbalized understanding;Verbal cues required          PT Short Term Goals - 03/04/17 1009      PT SHORT TERM GOAL #1   Title independent with intial HEP   Time 2   Period Weeks   Status New           PT Long Term Goals - 03/04/17 1009      PT LONG TERM GOAL #1   Title decrease TUG to 18 seconds   Time 8   Period Weeks   Status New     PT LONG TERM GOAL #2   Title increase Berg balance test score to 45/56   Time 8   Period Weeks   Status New     PT LONG TERM GOAL #3   Title walk 300 feet without difficulty using the 4WW.   Time 8   Period Weeks   Status New     PT LONG TERM GOAL #4   Title increase right LE strength to 4/5   Time 8   Period Weeks   Status New     PT LONG TERM GOAL #5   Title decrease right LE pain 50%   Time 8   Period  Weeks   Status New               Plan - 03/04/17 1007    Clinical Impression Statement Patient with  at least 3 falls in the past 6 months, she reports recently having to start using a 4WW from a [place].  She also reports difficulkty getting up from sitting.  She has some pain in the right lateral thig, very tender ITB and tight.  The right LE is weak compared to the left.  Has an antalgic gait on the right.  TUG was 25 seconds, Berg balance test was 38/56   Rehab Potential Good   PT Frequency 2x / week   PT Duration 8 weeks   PT Treatment/Interventions ADLs/Self Care Home Management;Electrical Stimulation;Moist Heat;Stair training;Functional mobility training;Gait training;Therapeutic activities;Therapeutic exercise;Balance training;Patient/family education;Manual techniques   PT Next Visit Plan Add gym exercises, could treat the ITB as well   Consulted and Agree with Plan of Care Patient      Patient will benefit from skilled therapeutic intervention in order to improve the following deficits and impairments:  Abnormal gait, Cardiopulmonary status limiting activity, Decreased activity tolerance, Decreased balance, Decreased mobility, Decreased strength, Decreased range of motion, Difficulty walking, Pain, Impaired flexibility, Postural dysfunction  Visit Diagnosis: Repeated falls - Plan: PT plan of care cert/re-cert  Muscle weakness (generalized) - Plan: PT plan of care cert/re-cert  Pain in right hip - Plan: PT plan of care cert/re-cert      G-Codes - 16/94/50 1012    Functional Assessment Tool Used (Outpatient Only) foto 60% limitation   Functional Limitation Mobility: Walking and moving around   Mobility: Walking and Moving Around Current Status [phone]) At least 60 percent but less than 80 percent impaired, limited or restricted   Mobility: Walking and Moving Around Goal Status [phone]) At least 40 percent but less than 60 percent impaired, limited or restricted       Problem List Patient Active Problem List   Diagnosis Date Noted  . Essential hypertension, benign 09/03/2014  .  Morbid obesity ([place]) 03/06/2014  . HTN (hypertension) 03/06/2014  . Vertigo 03/06/2014  . Type II or unspecified type diabetes mellitus without mention of complication, uncontrolled 03/06/2014  . Pure hypercholesterolemia 03/06/2014    Sumner Boast., PT 03/04/2017, 10:17 AM  [place], [place], [address] Phone: [phone]   Fax:  [phone]  Name: Jessica [name] MRN: [id number] Date of Birth: [birth year]

## 2017-03-10 ENCOUNTER — Encounter: Payer: Medicare Other | Attending: Family Medicine | Admitting: *Deleted

## 2017-03-10 DIAGNOSIS — Z713 Dietary counseling and surveillance: Secondary | ICD-10-CM | POA: Diagnosis not present

## 2017-03-10 DIAGNOSIS — E119 Type 2 diabetes mellitus without complications: Secondary | ICD-10-CM | POA: Diagnosis not present

## 2017-03-10 DIAGNOSIS — Z6841 Body Mass Index (BMI) 40.0 and over, adult: Secondary | ICD-10-CM | POA: Diagnosis not present

## 2017-03-11 ENCOUNTER — Ambulatory Visit: Payer: Medicare Other | Admitting: Physical Therapy

## 2017-03-11 ENCOUNTER — Encounter: Payer: Self-pay | Admitting: Physical Therapy

## 2017-03-11 DIAGNOSIS — R296 Repeated falls: Secondary | ICD-10-CM

## 2017-03-11 DIAGNOSIS — M25551 Pain in right hip: Secondary | ICD-10-CM | POA: Diagnosis not present

## 2017-03-11 DIAGNOSIS — M6281 Muscle weakness (generalized): Secondary | ICD-10-CM

## 2017-03-11 NOTE — Therapy (Signed)
[place], [place], [address] Phone: [phone]   Fax:  [phone]  Physical Therapy Treatment  Patient Details  Name: Jessica [name] MRN: [id number] Date of Birth: September 04, 1946 Referring Provider: W. Addison Lank  Encounter Date: 03/11/2017      PT End of Session - 03/11/17 1024    Visit Number 2   Date for PT Re-Evaluation 05/04/17   PT Start Time 0935   PT Stop Time 9937   PT Time Calculation (min) 40 min   Activity Tolerance Patient tolerated treatment well   Behavior During Therapy WFL for tasks assessed/performed      Past Medical History:  Diagnosis Date   Diabetes mellitus without complication ([place])    Diastolic dysfunction    Hyperlipidemia    Hypertension    Obesity    Sleep apnea     Past Surgical History:  Procedure Laterality Date   CARDIAC CATHETERIZATION     -6/09-normal ejection fraction 65%, no angiographically significant CAD., M. Skains MD   CESAREAN SECTION     foot spurs removed     JOINT REPLACEMENT     knee spurs removed     shoulder spurs removed     TONSILLECTOMY AND ADENOIDECTOMY      There were no vitals filed for this visit.      Subjective Assessment - 03/11/17 0953    Subjective Doing well today.  Painful Right hip at night.  Difficulty rising from a seated position.  Fatigues quickly   Pain Score 5    Pain Location Hip   Pain Orientation Right   Pain Type Chronic pain                         [facility] Adult PT Treatment/Exercise - 03/11/17 0001      Exercises   Exercises Knee/Hip;Lumbar     Lumbar Exercises: Stretches   ITB Stretch 3 reps;30 seconds   Piriformis Stretch 3 reps;30 seconds     Lumbar Exercises: Seated   Long Arc Quad on Chair Strengthening;Right;2 sets;10 reps;Weights   LAQ on Chair Weights (lbs) 3#   Sit to Stand Other (comment)  2x7     Lumbar Exercises: Supine   Bent Knee Raise 10  reps;2 seconds  2 sets   Straight Leg Raise 10 reps  2 sets     Knee/Hip Exercises: Stretches   Passive Hamstring Stretch Right;3 reps;30 seconds     Knee/Hip Exercises: Seated   Clamshell with TheraBand Red  2x10   Marching AROM;Both;2 sets;10 reps   Abduction/Adduction  Strengthening;Both;2 sets;10 reps  isometric     Knee/Hip Exercises: Supine   Bridges Strengthening;Both;2 sets;10 reps                  PT Short Term Goals - 03/04/17 1009      PT SHORT TERM GOAL #1   Title independent with intial HEP   Time 2   Period Weeks   Status New           PT Long Term Goals - 03/04/17 1009      PT LONG TERM GOAL #1   Title decrease TUG to 18 seconds   Time 8   Period Weeks   Status New     PT LONG TERM GOAL #2   Title increase Berg balance test score to 45/56   Time 8   Period Weeks  Status New     PT LONG TERM GOAL #3   Title walk 300 feet without difficulty using the 4WW.   Time 8   Period Weeks   Status New     PT LONG TERM GOAL #4   Title increase right LE strength to 4/5   Time 8   Period Weeks   Status New     PT LONG TERM GOAL #5   Title decrease right LE pain 50%   Time 8   Period Weeks   Status New               Plan - 03/11/17 1025    Clinical Impression Statement Fatigues quickly with PREs.  Requires cues with sit to stand to initiate Right knee flexion prior to contact with sitting surface.  This allows her to perform sit to stand without UE support.  Reports Right hip and lateral thigh pain during PREs.  Frank weakness Right hip flexion.        Patient will benefit from skilled therapeutic intervention in order to improve the following deficits and impairments:     Visit Diagnosis: Repeated falls  Muscle weakness (generalized)  Pain in right hip     Problem List Patient Active Problem List   Diagnosis Date Noted   Essential hypertension, benign 09/03/2014   Morbid obesity ([place]) 03/06/2014   HTN  (hypertension) 03/06/2014   Vertigo 03/06/2014   Type II or unspecified type diabetes mellitus without mention of complication, uncontrolled 03/06/2014   Pure hypercholesterolemia 03/06/2014    Olean Ree, PTA 03/11/2017, 10:30 AM  [place] [address] [place], [place], [address] Phone: [phone]   Fax:  [phone]  Name: Jessica [name] MRN: [id number] Date of Birth: August 23, 1946

## 2017-03-14 ENCOUNTER — Ambulatory Visit: Payer: Medicare Other | Admitting: Physical Therapy

## 2017-03-14 ENCOUNTER — Encounter: Payer: Self-pay | Admitting: Physical Therapy

## 2017-03-14 DIAGNOSIS — R296 Repeated falls: Secondary | ICD-10-CM

## 2017-03-14 DIAGNOSIS — M25551 Pain in right hip: Secondary | ICD-10-CM | POA: Diagnosis not present

## 2017-03-14 DIAGNOSIS — M6281 Muscle weakness (generalized): Secondary | ICD-10-CM | POA: Diagnosis not present

## 2017-03-14 NOTE — Progress Notes (Signed)
Diabetes Self-Management Education  Visit Type:  Follow-up  Appt. Start Time: 1200 Appt. End Time: 1230  03/14/2017  Ms. Jessica [name], identified by name and date of birth, is a 71 y.o. female with a diagnosis of Diabetes:  .  She now has the [place] CGM and would like assistance in putting her next one in today. She states her last A1c was 7.6%.   ASSESSMENT  Weight 241 lb (109.3 kg). Body mass index is 48.68 kg/m.       Diabetes Self-Management Education - 03/14/17 1110      Psychosocial Assessment   Self-management support Doctor's office;CDE visits   Patient Concerns Nutrition/Meal planning;Glycemic Control;Weight Control     Complications   Number of hypoglycemic episodes per month 0     Exercise   Exercise Type ADL's     Patient Education   Previous Diabetes Education Yes (please comment)   Disease state  Explored patient's options for treatment of their diabetes   Nutrition management  Meal options for control of blood glucose level and chronic complications.   Physical activity and exercise  Helped patient identify appropriate exercises in relation to his/her diabetes, diabetes complications and other health issue.   Monitoring --  She is using the Elenor [place]ato now in place of finger sticks   Acute complications Discussed and identified patients' treatment of hyperglycemia.  encouraged her to bolus for any elevated BG during the day, even if not a meal time.     Individualized Goals (developed by patient)   Nutrition General guidelines for healthy choices and portions discussed   Physical Activity Exercise 3-5 times per week   Medications take my medication as prescribed   Monitoring  --  assisted patient in putting new Libre on     Post-Education Assessment   Patient understands incorporating nutritional management into lifestyle. Needs Review   Patient undertands incorporating physical activity into lifestyle. Needs Review   Patient understands using medications  safely. Needs Review   Patient understands monitoring blood glucose, interpreting and using results Demonstrates understanding / competency     Outcomes   Program Status Completed     Subsequent Visit   Since your last visit have you continued or begun to take your medications as prescribed? Yes   Since your last visit, are you checking your blood glucose at least once a day? Yes      Learning Objective:  Patient will have a greater understanding of diabetes self-management. Patient education plan is to attend individual and/or group sessions per assessed needs and concerns.  I was not able to upload her pump today, I do not have the software on my new computer yet. I plan to upload at our next appointment.   Plan:   Patient Instructions  Plan: Continue to use Bolus Wizard with every meal Consider using Bolus Wizard to correct any elevated BG during the day, even if not at meal time to help bring A1c down effectively. Continue checking BG before the meal and giving Correction Bolus first, then entering food for Meal Bolus after Correction has been delivered. Consider aiming for 3 Carb Choices per meal (45 grams) +/- 1 either way  Continue with the fresh fruits and vegetables  Aim for 0-2 Carbs per snack if hungry  Consider resuming your exerciise at the gym at least two or three times a week. We inserted a new Libre for you today and reviewed the instructions. Good job!  Expected Outcomes:  Demonstrated interest in learning. Expect  positive outcomes  Education material provided: No new handouts today  If problems or questions, patient to contact team via:  Phone  Future DSME appointment: - 2 months

## 2017-03-14 NOTE — Patient Instructions (Addendum)
Plan: Continue to use Bolus Wizard with every meal Consider using Bolus Wizard to correct any elevated BG during the day, even if not at meal time to help bring A1c down effectively. Continue checking BG before the meal and giving Correction Bolus first, then entering food for Meal Bolus after Correction has been delivered. Consider aiming for 3 Carb Choices per meal (45 grams) +/- 1 either way  Continue with the fresh fruits and vegetables  Aim for 0-2 Carbs per snack if hungry  Consider resuming your exerciise at the gym at least two or three times a week. We inserted a new Libre for you today and reviewed the instructions. Good job!

## 2017-03-14 NOTE — Therapy (Signed)
[employer] [place], [place], [address] Phone: [phone]   Fax:  [phone]  Physical Therapy Treatment  Patient Details  Name: Jessica [name] MRN: [id number] Date of Birth: March 27, 1946 Referring Provider: W. Addison Lank  Encounter Date: 03/14/2017      PT End of Session - 03/14/17 1003    Visit Number 3   Date for PT Re-Evaluation 05/04/17   PT Start Time 0931   PT Stop Time 1013   PT Time Calculation (min) 42 min   Activity Tolerance Patient tolerated treatment well   Behavior During Therapy [facility] for tasks assessed/performed      Past Medical History:  Diagnosis Date  . Diabetes mellitus without complication ([place])   . Diastolic dysfunction   . Hyperlipidemia   . Hypertension   . Obesity   . Sleep apnea     Past Surgical History:  Procedure Laterality Date  . CARDIAC CATHETERIZATION     -6/09-normal ejection fraction 65%, no angiographically significant CAD., M. Skains MD  . CESAREAN SECTION    . foot spurs removed    . JOINT REPLACEMENT    . knee spurs removed    . shoulder spurs removed    . TONSILLECTOMY AND ADENOIDECTOMY      There were no vitals filed for this visit.      Subjective Assessment - 03/14/17 0933    Subjective No falls, reports that right hip pops and snaps with walking.   Currently in Pain? Yes   Pain Score 2    Pain Location Hip   Pain Orientation Right;Lateral   Pain Descriptors / Indicators Aching                         [facility] Adult PT Treatment/Exercise - 03/14/17 0001      High Level Balance   High Level Balance Activities Side stepping;Backward walking   High Level Balance Comments ball toss on solid surface, airex standing, eyes open, closed, head turns     Lumbar Exercises: Aerobic   Tread Mill NuStep Level 3 x 6 minutes     Lumbar Exercises: Standing   Other Standing Lumbar Exercises hip abduction 3# bilaterally     Lumbar  Exercises: Seated   Long Arc Quad on Chair Strengthening;Right;2 sets;10 reps;Weights   LAQ on Chair Weights (lbs) 3#     Lumbar Exercises: Supine   Bent Knee Raise 10 reps;2 seconds   Straight Leg Raise 20 reps     Knee/Hip Exercises: Seated   Clamshell with TheraBand Red  2x10   Marching AROM;Both;2 sets;10 reps   Abduction/Adduction  Strengthening;Both;2 sets;10 reps                  PT Short Term Goals - 03/14/17 1004      PT SHORT TERM GOAL #1   Title independent with intial HEP   Status On-going           PT Long Term Goals - 03/04/17 1009      PT LONG TERM GOAL #1   Title decrease TUG to 18 seconds   Time 8   Period Weeks   Status New     PT LONG TERM GOAL #2   Title increase Berg balance test score to 45/56   Time 8   Period Weeks   Status New     PT LONG TERM GOAL #3   Title walk 300  feet without difficulty using the 4WW.   Time 8   Period Weeks   Status New     PT LONG TERM GOAL #4   Title increase right LE strength to 4/5   Time 8   Period Weeks   Status New     PT LONG TERM GOAL #5   Title decrease right LE pain 50%   Time 8   Period Weeks   Status New               Plan - 03/14/17 1003    Clinical Impression Statement Patient has difficulty with all balance activities, especially on the dynamic surface, she tends to have difficulty picking up the right foot, she tended to go to the back when she would lose her balance.   PT Next Visit Plan Add gym exercises, could treat the ITB as well   Consulted and Agree with Plan of Care Patient      Patient will benefit from skilled therapeutic intervention in order to improve the following deficits and impairments:  Abnormal gait, Cardiopulmonary status limiting activity, Decreased activity tolerance, Decreased balance, Decreased mobility, Decreased strength, Decreased range of motion, Difficulty walking, Pain, Impaired flexibility, Postural dysfunction  Visit  Diagnosis: Repeated falls  Muscle weakness (generalized)  Pain in right hip     Problem List Patient Active Problem List   Diagnosis Date Noted  . Essential hypertension, benign 09/03/2014  . Morbid obesity ([place]) 03/06/2014  . HTN (hypertension) 03/06/2014  . Vertigo 03/06/2014  . Type II or unspecified type diabetes mellitus without mention of complication, uncontrolled 03/06/2014  . Pure hypercholesterolemia 03/06/2014    Sumner Boast., PT 03/14/2017, 10:05 AM  [place], [place], [address] Phone: [phone]   Fax:  [phone]  Name: RIKITA GRABERT MRN: [id number] Date of Birth: May 13, 1946

## 2017-03-16 ENCOUNTER — Ambulatory Visit: Payer: Medicare Other | Admitting: Physical Therapy

## 2017-03-16 ENCOUNTER — Encounter: Payer: Self-pay | Admitting: Physical Therapy

## 2017-03-16 DIAGNOSIS — M6281 Muscle weakness (generalized): Secondary | ICD-10-CM | POA: Diagnosis not present

## 2017-03-16 DIAGNOSIS — R296 Repeated falls: Secondary | ICD-10-CM | POA: Diagnosis not present

## 2017-03-16 DIAGNOSIS — M25551 Pain in right hip: Secondary | ICD-10-CM

## 2017-03-16 NOTE — Therapy (Signed)
[place], [name], [address] Phone: [phone]   Fax:  [phone]  Physical Therapy Treatment  Patient Details  Name: Jessica [name] MRN: [id number] Date of Birth: 08/08/1946 Referring Provider: W. Addison Lank  Encounter Date: 03/16/2017      PT End of Session - 03/16/17 0958    Visit Number 4   Date for PT Re-Evaluation 05/04/17   PT Start Time 0924   PT Stop Time 1010   PT Time Calculation (min) 46 min   Activity Tolerance Patient tolerated treatment well   Behavior During Therapy Jessica [name] for tasks assessed/performed      Past Medical History:  Diagnosis Date  . Diabetes mellitus without complication ([place])   . Diastolic dysfunction   . Hyperlipidemia   . Hypertension   . Obesity   . Sleep apnea     Past Surgical History:  Procedure Laterality Date  . CARDIAC CATHETERIZATION     -6/09-normal ejection fraction 65%, no angiographically significant CAD., M. Skains MD  . CESAREAN SECTION    . foot spurs removed    . JOINT REPLACEMENT    . knee spurs removed    . shoulder spurs removed    . TONSILLECTOMY AND ADENOIDECTOMY      There were no vitals filed for this visit.      Subjective Assessment - 03/16/17 0935    Subjective Patient reports pain in the hip the knee and the wrist   Currently in Pain? Yes   Pain Score 3    Pain Location Hip  wrist                         [facility] Adult PT Treatment/Exercise - 03/16/17 0001      High Level Balance   High Level Balance Activities Side stepping;Backward walking   High Level Balance Comments ball toss on solid surface, airex standing, eyes open, closed, head turns     Lumbar Exercises: Aerobic   Tread Mill NuStep Level 3 x 6 minutes     Lumbar Exercises: Standing   Heel Raises 20 reps;2 seconds   Other Standing Lumbar Exercises hip abduction 3# bilaterally   Other Standing Lumbar Exercises knee flexion 3#      Lumbar Exercises: Seated   Long Arc Quad on Chair Strengthening;Right;2 sets;10 reps;Weights   LAQ on Chair Weights (lbs) 3#     Knee/Hip Exercises: Stretches   Passive Hamstring Stretch Right;3 reps;30 seconds   Passive Hamstring Stretch Limitations gastroc as well     Knee/Hip Exercises: Seated   Ball Squeeze 20 reps   Marching AROM;Both;2 sets;10 reps                  PT Short Term Goals - 03/16/17 1005      PT SHORT TERM GOAL #1   Title independent with intial HEP   Status Achieved           PT Long Term Goals - 03/04/17 1009      PT LONG TERM GOAL #1   Title decrease TUG to 18 seconds   Time 8   Period Weeks   Status New     PT LONG TERM GOAL #2   Title increase Berg balance test score to 45/56   Time 8   Period Weeks   Status New     PT LONG TERM GOAL #3   Title walk 300 feet  without difficulty using the 4WW.   Time 8   Period Weeks   Status New     PT LONG TERM GOAL #4   Title increase right LE strength to 4/5   Time 8   Period Weeks   Status New     PT LONG TERM GOAL #5   Title decrease right LE pain 50%   Time 8   Period Weeks   Status New               Plan - 03/16/17 1004    Clinical Impression Statement Pateint again with the most difficulty with balance on the airex, she tends to fall backward.  Other activities are tolerated well without much difficulty or pain   PT Next Visit Plan continue to work on the balance and functional strength   Consulted and Agree with Plan of Care Patient      Patient will benefit from skilled therapeutic intervention in order to improve the following deficits and impairments:  Abnormal gait, Cardiopulmonary status limiting activity, Decreased activity tolerance, Decreased balance, Decreased mobility, Decreased strength, Decreased range of motion, Difficulty walking, Pain, Impaired flexibility, Postural dysfunction  Visit Diagnosis: Repeated falls  Muscle weakness  (generalized)  Pain in right hip     Problem List Patient Active Problem List   Diagnosis Date Noted  . Essential hypertension, benign 09/03/2014  . Morbid obesity ([place]) 03/06/2014  . HTN (hypertension) 03/06/2014  . Vertigo 03/06/2014  . Type II or unspecified type diabetes mellitus without mention of complication, uncontrolled 03/06/2014  . Pure hypercholesterolemia 03/06/2014    Jessica Boast., PT 03/16/2017, 10:06 AM  [name], [name], [address] Phone: [phone]   Fax:  [phone]  Name: Jessica [name] MRN: [id number] Date of Birth: 04-30-1946

## 2017-03-17 DIAGNOSIS — M79641 Pain in right hand: Secondary | ICD-10-CM | POA: Diagnosis not present

## 2017-03-17 DIAGNOSIS — M25561 Pain in right knee: Secondary | ICD-10-CM | POA: Diagnosis not present

## 2017-03-22 DIAGNOSIS — R319 Hematuria, unspecified: Secondary | ICD-10-CM | POA: Diagnosis not present

## 2017-03-22 DIAGNOSIS — N950 Postmenopausal bleeding: Secondary | ICD-10-CM | POA: Diagnosis not present

## 2017-03-23 ENCOUNTER — Ambulatory Visit: Payer: Medicare Other | Admitting: Physical Therapy

## 2017-03-23 ENCOUNTER — Encounter: Payer: Self-pay | Admitting: Physical Therapy

## 2017-03-23 DIAGNOSIS — M25551 Pain in right hip: Secondary | ICD-10-CM | POA: Diagnosis not present

## 2017-03-23 DIAGNOSIS — R296 Repeated falls: Secondary | ICD-10-CM | POA: Diagnosis not present

## 2017-03-23 DIAGNOSIS — M6281 Muscle weakness (generalized): Secondary | ICD-10-CM | POA: Diagnosis not present

## 2017-03-23 NOTE — Therapy (Signed)
[place], [place], [address] Phone: [phone]   Fax:  [phone]  Physical Therapy Treatment  Patient Details  Name: Jessica [name] MRN: [id number] Date of Birth: [birth year] Referring Provider: W. Addison Lank  Encounter Date: 03/23/2017      PT End of Session - 03/23/17 1012    Visit Number 5   Date for PT Re-Evaluation 05/04/17   PT Start Time 0931   PT Stop Time 1012   PT Time Calculation (min) 41 min   Activity Tolerance Patient tolerated treatment well   Behavior During Therapy [facility] for tasks assessed/performed      Past Medical History:  Diagnosis Date  . Diabetes mellitus without complication ([place])   . Diastolic dysfunction   . Hyperlipidemia   . Hypertension   . Obesity   . Sleep apnea     Past Surgical History:  Procedure Laterality Date  . CARDIAC CATHETERIZATION     -6/09-normal ejection fraction 65%, no angiographically significant CAD., M. Skains MD  . CESAREAN SECTION    . foot spurs removed    . JOINT REPLACEMENT    . knee spurs removed    . shoulder spurs removed    . TONSILLECTOMY AND ADENOIDECTOMY      There were no vitals filed for this visit.      Subjective Assessment - 03/23/17 0933    Subjective "Pain in my R side"    Currently in Pain? Yes   Pain Score 4    Pain Location Hip   Pain Orientation Right                         [facility] Adult PT Treatment/Exercise - 03/23/17 0001      High Level Balance   High Level Balance Activities Side stepping;Backward walking   High Level Balance Comments ball toss on solid surface, airex standing, eyes open, closed, head turns     Lumbar Exercises: Aerobic   Tread Mill NuStep Level 3 x 6 minutes     Lumbar Exercises: Standing   Heel Raises 20 reps;2 seconds   Other Standing Lumbar Exercises hip abduction 3# bilaterally   Other Standing Lumbar Exercises knee flexion 3#     Knee/Hip  Exercises: Seated   Long Arc Quad 2 sets;15 reps;Weights;Both   Long Arc Quad Weight 3 lbs.   Ball Squeeze 20 reps   Marching AROM;Both;2 sets;10 reps   Marching Limitations 3                  PT Short Term Goals - 03/16/17 1005      PT SHORT TERM GOAL #1   Title independent with intial HEP   Status Achieved           PT Long Term Goals - 03/04/17 1009      PT LONG TERM GOAL #1   Title decrease TUG to 18 seconds   Time 8   Period Weeks   Status New     PT LONG TERM GOAL #2   Title increase Berg balance test score to 45/56   Time 8   Period Weeks   Status New     PT LONG TERM GOAL #3   Title walk 300 feet without difficulty using the 4WW.   Time 8   Period Weeks   Status New     PT LONG TERM GOAL #4   Title increase  right LE strength to 4/5   Time 8   Period Weeks   Status New     PT LONG TERM GOAL #5   Title decrease right LE pain 50%   Time 8   Period Weeks   Status New               Plan - 03/23/17 1012    Clinical Impression Statement Pt able to complete all of today's resistance exercises without issue. Does reports some R knee pain but tolerable. Most difficulty with nn compliant surfaces.   Rehab Potential Good   PT Frequency 2x / week   PT Duration 8 weeks   PT Treatment/Interventions ADLs/Self Care Home Management;Electrical Stimulation;Moist Heat;Stair training;Functional mobility training;Gait training;Therapeutic activities;Therapeutic exercise;Balance training;Patient/family education;Manual techniques   PT Next Visit Plan continue to work on the balance and functional strength      Patient will benefit from skilled therapeutic intervention in order to improve the following deficits and impairments:  Abnormal gait, Cardiopulmonary status limiting activity, Decreased activity tolerance, Decreased balance, Decreased mobility, Decreased strength, Decreased range of motion, Difficulty walking, Pain, Impaired flexibility,  Postural dysfunction  Visit Diagnosis: Repeated falls  Muscle weakness (generalized)  Pain in right hip     Problem List Patient Active Problem List   Diagnosis Date Noted  . Essential hypertension, benign 09/03/2014  . Morbid obesity ([place]) 03/06/2014  . HTN (hypertension) 03/06/2014  . Vertigo 03/06/2014  . Type II or unspecified type diabetes mellitus without mention of complication, uncontrolled 03/06/2014  . Pure hypercholesterolemia 03/06/2014    Jessica [name], Jessica [name] 03/23/2017, 10:14 AM  [place], [place], [address] Phone: [phone]   Fax:  [phone]  Name: Jessica [name] MRN: [id number] Date of Birth: 01-May-1946

## 2017-03-25 ENCOUNTER — Ambulatory Visit: Payer: Medicare Other | Attending: Family Medicine | Admitting: Physical Therapy

## 2017-03-25 ENCOUNTER — Encounter: Payer: Self-pay | Admitting: Physical Therapy

## 2017-03-25 DIAGNOSIS — M25551 Pain in right hip: Secondary | ICD-10-CM | POA: Diagnosis not present

## 2017-03-25 DIAGNOSIS — R296 Repeated falls: Secondary | ICD-10-CM | POA: Insufficient documentation

## 2017-03-25 DIAGNOSIS — G8929 Other chronic pain: Secondary | ICD-10-CM | POA: Diagnosis not present

## 2017-03-25 DIAGNOSIS — M6281 Muscle weakness (generalized): Secondary | ICD-10-CM | POA: Insufficient documentation

## 2017-03-25 DIAGNOSIS — M25561 Pain in right knee: Secondary | ICD-10-CM | POA: Insufficient documentation

## 2017-03-25 NOTE — Therapy (Signed)
[place], [place], [address] Phone: [phone]   Fax:  [phone]  Physical Therapy Treatment  Patient Details  Name: Jessica [name] MRN: [id number] Date of Birth: 1946/04/03 Referring Provider: W. Addison Lank  Encounter Date: 03/25/2017      PT End of Session - 03/25/17 1008    Visit Number 6   Date for PT Re-Evaluation 05/04/17   PT Start Time 0938   PT Stop Time 1011   PT Time Calculation (min) 33 min   Activity Tolerance Patient tolerated treatment well   Behavior During Therapy [facility] for tasks assessed/performed      Past Medical History:  Diagnosis Date  . Diabetes mellitus without complication ([place])   . Diastolic dysfunction   . Hyperlipidemia   . Hypertension   . Obesity   . Sleep apnea     Past Surgical History:  Procedure Laterality Date  . CARDIAC CATHETERIZATION     -6/09-normal ejection fraction 65%, no angiographically significant CAD., M. Skains MD  . CESAREAN SECTION    . foot spurs removed    . JOINT REPLACEMENT    . knee spurs removed    . shoulder spurs removed    . TONSILLECTOMY AND ADENOIDECTOMY      There were no vitals filed for this visit.      Subjective Assessment - 03/25/17 0938    Subjective "Better"   Currently in Pain? No/denies   Pain Score 0-No pain                         [facility] Adult PT Treatment/Exercise - 03/25/17 0001      Lumbar Exercises: Aerobic   Tread Mill NuStep Level 3 x 6 minutes     Lumbar Exercises: Standing   Heel Raises 20 reps;2 seconds   Row Theraband;15 reps;Both;Strengthening  x2   Theraband Level (Row) Level 3 (Green)   Other Standing Lumbar Exercises knee flexion 3#; Stabding march 3lb 2x10      Knee/Hip Exercises: Seated   Long Arc Quad 2 sets;15 reps;Weights;Both   Long Arc Quad Weight 3 lbs.   Ball Squeeze 20 reps   Marching AROM;Both;2 sets;10 reps   Marching Limitations 3   Hamstring  Curl 2 sets;10 reps   Hamstring Limitations geen band                  PT Short Term Goals - 03/16/17 1005      PT SHORT TERM GOAL #1   Title independent with intial HEP   Status Achieved           PT Long Term Goals - 03/25/17 1011      PT LONG TERM GOAL #1   Title decrease TUG to 18 seconds   Status On-going     PT LONG TERM GOAL #2   Title increase Berg balance test score to 45/56   Status On-going     PT LONG TERM GOAL #4   Title increase right LE strength to 4/5   Status On-going     PT LONG TERM GOAL #5   Title decrease right LE pain 50%   Status Partially Met               Plan - 03/25/17 1009    Clinical Impression Statement Pt ~ 8 minutes late for today's treatment, pt reports a burning sensation in her low back on R  side after NuStep warm up. No issues with other interventions.    Rehab Potential Good   PT Frequency 2x / week   PT Duration 8 weeks   PT Treatment/Interventions ADLs/Self Care Home Management;Electrical Stimulation;Moist Heat;Stair training;Functional mobility training;Gait training;Therapeutic activities;Therapeutic exercise;Balance training;Patient/family education;Manual techniques   PT Next Visit Plan continue to work on the balance and functional strength      Patient will benefit from skilled therapeutic intervention in order to improve the following deficits and impairments:  Abnormal gait, Cardiopulmonary status limiting activity, Decreased activity tolerance, Decreased balance, Decreased mobility, Decreased strength, Decreased range of motion, Difficulty walking, Pain, Impaired flexibility, Postural dysfunction  Visit Diagnosis: Repeated falls  Muscle weakness (generalized)  Pain in right hip     Problem List Patient Active Problem List   Diagnosis Date Noted  . Essential hypertension, benign 09/03/2014  . Morbid obesity ([place]) 03/06/2014  . HTN (hypertension) 03/06/2014  . Vertigo 03/06/2014  . Type II  or unspecified type diabetes mellitus without mention of complication, uncontrolled 03/06/2014  . Pure hypercholesterolemia 03/06/2014    Scot Jun, PTA 03/25/2017, 10:11 AM  [place], [place], [address] Phone: [phone]   Fax:  [phone]  Name: Jessica [name] MRN: [id number] Date of Birth: 1945-10-27

## 2017-03-29 DIAGNOSIS — M79672 Pain in left foot: Secondary | ICD-10-CM | POA: Diagnosis not present

## 2017-03-29 DIAGNOSIS — M79671 Pain in right foot: Secondary | ICD-10-CM | POA: Diagnosis not present

## 2017-03-30 ENCOUNTER — Ambulatory Visit: Payer: Medicare Other | Admitting: Physical Therapy

## 2017-03-30 ENCOUNTER — Encounter: Payer: Self-pay | Admitting: Physical Therapy

## 2017-03-30 DIAGNOSIS — R296 Repeated falls: Secondary | ICD-10-CM

## 2017-03-30 DIAGNOSIS — M25551 Pain in right hip: Secondary | ICD-10-CM

## 2017-03-30 DIAGNOSIS — M25561 Pain in right knee: Secondary | ICD-10-CM | POA: Diagnosis not present

## 2017-03-30 DIAGNOSIS — G8929 Other chronic pain: Secondary | ICD-10-CM | POA: Diagnosis not present

## 2017-03-30 DIAGNOSIS — M6281 Muscle weakness (generalized): Secondary | ICD-10-CM | POA: Diagnosis not present

## 2017-03-30 NOTE — Therapy (Signed)
[place], [place], [address] Phone: [phone]   Fax:  [phone]  Physical Therapy Treatment  Patient Details  Name: Jessica [name] MRN: [id number] Date of Birth: Aug 22, 1946 Referring Provider: W. Addison Lank  Encounter Date: 03/30/2017      PT End of Session - 03/30/17 1007    Visit Number 7   Date for PT Re-Evaluation 05/04/17   PT Start Time 0928   PT Stop Time 3276   PT Time Calculation (min) 46 min   Activity Tolerance Patient tolerated treatment well   Behavior During Therapy [facility] for tasks assessed/performed      Past Medical History:  Diagnosis Date  . Diabetes mellitus without complication ([place])   . Diastolic dysfunction   . Hyperlipidemia   . Hypertension   . Obesity   . Sleep apnea     Past Surgical History:  Procedure Laterality Date  . CARDIAC CATHETERIZATION     -6/09-normal ejection fraction 65%, no angiographically significant CAD., M. Skains MD  . CESAREAN SECTION    . foot spurs removed    . JOINT REPLACEMENT    . knee spurs removed    . shoulder spurs removed    . TONSILLECTOMY AND ADENOIDECTOMY      There were no vitals filed for this visit.      Subjective Assessment - 03/30/17 0930    Subjective "I woke up about 1:20 this morning and the pain was back" "Took a tramadol, and went back to sleep" " Reports minimal pain now.   Currently in Pain? Yes   Pain Score 2    Pain Location Hip   Pain Orientation Right            [facility] PT Assessment - 03/30/17 0001      Berg Balance Test   Sit to Stand Able to stand without using hands and stabilize independently   Standing Unsupported Able to stand safely 2 minutes   Sitting with Back Unsupported but Feet Supported on Floor or Stool Able to sit safely and securely 2 minutes   Stand to Sit Sits safely with minimal use of hands   Transfers Able to transfer safely, minor use of hands   Standing  Unsupported with Eyes Closed Able to stand 10 seconds safely   Standing Ubsupported with Feet Together Able to place feet together independently and stand 1 minute safely   From Standing, Reach Forward with Outstretched Arm Can reach confidently >25 cm (10")   From Standing Position, Pick up Object from Floor Able to pick up shoe, needs supervision   From Standing Position, Turn to Look Behind Over each Shoulder Looks behind one side only/other side shows less weight shift   Turn 360 Degrees Able to turn 360 degrees safely but slowly   Standing Unsupported, Alternately Place Feet on Step/Stool Able to stand independently and safely and complete 8 steps in 20 seconds   Standing Unsupported, One Foot in [employer] balance while stepping or standing   Standing on One Leg Tries to lift leg/unable to hold 3 seconds but remains standing independently   Total Score 45     Timed Up and Go Test   Normal TUG (seconds) 15.32  With rolator                     [facility] Adult PT Treatment/Exercise - 03/30/17 0001      Lumbar Exercises: Aerobic  Tread Mill NuStep Level 4 x 7 minutes   UBE (Upper Arm Bike) L3 x5 min      Lumbar Exercises: Standing   Row Theraband;15 reps;Both;Strengthening  x2   Theraband Level (Row) Level 3 (Green);Level 1 (Yellow)                  PT Short Term Goals - 03/16/17 1005      PT SHORT TERM GOAL #1   Title independent with intial HEP   Status Achieved           PT Long Term Goals - 03/30/17 0957      PT LONG TERM GOAL #1   Title decrease TUG to 18 seconds   Status Achieved     PT LONG TERM GOAL #2   Title increase Berg balance test score to 45/56   Status Achieved     PT LONG TERM GOAL #3   Title walk 300 feet without difficulty using the 4WW.   Status On-going     PT LONG TERM GOAL #4   Title increase right LE strength to 4/5   Status On-going     PT LONG TERM GOAL #5   Title decrease right LE pain 50%   Status Partially  Met               Plan - 03/30/17 1007    Clinical Impression Statement Pt has progressed completing some of her LTG's. Does have some instability with tandem and SL standing.    Rehab Potential Good   PT Frequency 2x / week   PT Duration 8 weeks   PT Treatment/Interventions Contrast Bath   PT Next Visit Plan continue to work on the balance and functional strength      Patient will benefit from skilled therapeutic intervention in order to improve the following deficits and impairments:  Abnormal gait, Cardiopulmonary status limiting activity, Decreased activity tolerance, Decreased balance, Decreased mobility, Decreased strength, Decreased range of motion, Difficulty walking, Pain, Impaired flexibility, Postural dysfunction  Visit Diagnosis: Repeated falls  Muscle weakness (generalized)  Pain in right hip     Problem List Patient Active Problem List   Diagnosis Date Noted  . Essential hypertension, benign 09/03/2014  . Morbid obesity ([place]) 03/06/2014  . HTN (hypertension) 03/06/2014  . Vertigo 03/06/2014  . Type II or unspecified type diabetes mellitus without mention of complication, uncontrolled 03/06/2014  . Pure hypercholesterolemia 03/06/2014    Scot Jun, PTA 03/30/2017, 10:08 AM  [place], [place], [address] Phone: [phone]   Fax:  [phone]  Name: [name] MRN: [id number] Date of Birth: 06/03/1946

## 2017-04-01 ENCOUNTER — Ambulatory Visit: Payer: Medicare Other | Admitting: Physical Therapy

## 2017-04-01 ENCOUNTER — Encounter: Payer: Self-pay | Admitting: Physical Therapy

## 2017-04-01 DIAGNOSIS — M25551 Pain in right hip: Secondary | ICD-10-CM

## 2017-04-01 DIAGNOSIS — M6281 Muscle weakness (generalized): Secondary | ICD-10-CM | POA: Diagnosis not present

## 2017-04-01 DIAGNOSIS — G8929 Other chronic pain: Secondary | ICD-10-CM | POA: Diagnosis not present

## 2017-04-01 DIAGNOSIS — R296 Repeated falls: Secondary | ICD-10-CM

## 2017-04-01 DIAGNOSIS — M25561 Pain in right knee: Secondary | ICD-10-CM | POA: Diagnosis not present

## 2017-04-01 NOTE — Therapy (Signed)
[place], [place], [address] Phone: [phone]   Fax:  [phone]  Physical Therapy Treatment  Patient Details  Name: Jessica [name] MRN: [id number] Date of Birth: 1946-05-06 Referring Provider: W. Addison Lank  Encounter Date: 04/01/2017      PT End of Session - 04/01/17 1011    Visit Number 8   Date for PT Re-Evaluation 05/04/17   PT Start Time 0930   PT Stop Time 1015   PT Time Calculation (min) 45 min   Activity Tolerance Patient tolerated treatment well   Behavior During Therapy [facility] for tasks assessed/performed      Past Medical History:  Diagnosis Date  . Diabetes mellitus without complication ([place])   . Diastolic dysfunction   . Hyperlipidemia   . Hypertension   . Obesity   . Sleep apnea     Past Surgical History:  Procedure Laterality Date  . CARDIAC CATHETERIZATION     -6/09-normal ejection fraction 65%, no angiographically significant CAD., M. Skains MD  . CESAREAN SECTION    . foot spurs removed    . JOINT REPLACEMENT    . knee spurs removed    . shoulder spurs removed    . TONSILLECTOMY AND ADENOIDECTOMY      There were no vitals filed for this visit.      Subjective Assessment - 04/01/17 0935    Subjective Pt reports that she got dizzy this morning and fell back into the wall.   Currently in Pain? Yes   Pain Score 5    Pain Location Back                         [facility] Adult PT Treatment/Exercise - 04/01/17 0001      High Level Balance   High Level Balance Activities Side stepping     Lumbar Exercises: Aerobic   Tread Mill NuStep Level 4 x 7 minutes   UBE (Upper Arm Bike) L3 x4 min      Lumbar Exercises: Standing   Row Theraband;15 reps;Both;Strengthening  x2   Theraband Level (Row) Level 4 (Blue);Level 1 (Yellow)  yellow for balance   Shoulder Extension 15 reps;Theraband;Both  x2   Theraband Level (Shoulder Extension) Level 1  (Yellow)     Knee/Hip Exercises: Standing   Hip Abduction 2 sets;Both;10 reps   Abduction Limitations 5lb   Other Standing Knee Exercises 5lb standing march with rollator 2x10      Knee/Hip Exercises: Seated   Long Arc Quad 2 sets;15 reps;Weights;Both   Long Arc Quad Weight 5 lbs.   Ball Squeeze 20 reps   Sit to [employer] 10 reps;with UE support;3 sets                  PT Short Term Goals - 03/16/17 1005      PT SHORT TERM GOAL #1   Title independent with intial HEP   Status Achieved           PT Long Term Goals - 03/30/17 0957      PT LONG TERM GOAL #1   Title decrease TUG to 18 seconds   Status Achieved     PT LONG TERM GOAL #2   Title increase Berg balance test score to 45/56   Status Achieved     PT LONG TERM GOAL #3   Title walk 300 feet without difficulty using the 4WW.   Status On-going  PT LONG TERM GOAL #4   Title increase right LE strength to 4/5   Status On-going     PT LONG TERM GOAL #5   Title decrease right LE pain 50%   Status Partially Met               Plan - 04/01/17 1011    Clinical Impression Statement Pt reports that her fall back into the wall was due to her going too fast. Able to complete all of today's activities, does reports some hip soreness with abduction.    Rehab Potential Good   PT Frequency 2x / week   PT Duration 8 weeks   PT Treatment/Interventions Contrast Bath   PT Next Visit Plan continue to work on the balance and functional strength      Patient will benefit from skilled therapeutic intervention in order to improve the following deficits and impairments:  Abnormal gait, Cardiopulmonary status limiting activity, Decreased activity tolerance, Decreased balance, Decreased mobility, Decreased strength, Decreased range of motion, Difficulty walking, Pain, Impaired flexibility, Postural dysfunction  Visit Diagnosis: Repeated falls  Muscle weakness (generalized)  Pain in right hip     Problem  List Patient Active Problem List   Diagnosis Date Noted  . Essential hypertension, benign 09/03/2014  . Morbid obesity ([place]) 03/06/2014  . HTN (hypertension) 03/06/2014  . Vertigo 03/06/2014  . Type II or unspecified type diabetes mellitus without mention of complication, uncontrolled 03/06/2014  . Pure hypercholesterolemia 03/06/2014    Scot Jun, PTA 04/01/2017, 10:13 AM  [place], [place], [address] Phone: [phone]   Fax:  [phone]  Name: Jessica [name] MRN: [id number] Date of Birth: 06-10-1946

## 2017-04-06 ENCOUNTER — Encounter: Payer: Self-pay | Admitting: Physical Therapy

## 2017-04-06 ENCOUNTER — Ambulatory Visit: Payer: Medicare Other | Admitting: Physical Therapy

## 2017-04-06 DIAGNOSIS — R296 Repeated falls: Secondary | ICD-10-CM

## 2017-04-06 DIAGNOSIS — M25551 Pain in right hip: Secondary | ICD-10-CM | POA: Diagnosis not present

## 2017-04-06 DIAGNOSIS — M6281 Muscle weakness (generalized): Secondary | ICD-10-CM

## 2017-04-06 DIAGNOSIS — G8929 Other chronic pain: Secondary | ICD-10-CM | POA: Diagnosis not present

## 2017-04-06 DIAGNOSIS — M25561 Pain in right knee: Secondary | ICD-10-CM | POA: Diagnosis not present

## 2017-04-06 NOTE — Therapy (Signed)
[place], [place], [address] Phone: [phone]   Fax:  [phone]  Physical Therapy Treatment  Patient Details  Name: Jessica [name] MRN: [id number] Date of Birth: 04/30/1946 Referring Provider: W. Addison Lank  Encounter Date: 04/06/2017      PT End of Session - 04/06/17 1011    Visit Number 9   Date for PT Re-Evaluation 05/04/17   PT Start Time 0933   PT Stop Time 1015   PT Time Calculation (min) 42 min      Past Medical History:  Diagnosis Date  . Diabetes mellitus without complication ([place])   . Diastolic dysfunction   . Hyperlipidemia   . Hypertension   . Obesity   . Sleep apnea     Past Surgical History:  Procedure Laterality Date  . CARDIAC CATHETERIZATION     -6/09-normal ejection fraction 65%, no angiographically significant CAD., M. Skains MD  . CESAREAN SECTION    . foot spurs removed    . JOINT REPLACEMENT    . knee spurs removed    . shoulder spurs removed    . TONSILLECTOMY AND ADENOIDECTOMY      There were no vitals filed for this visit.      Subjective Assessment - 04/06/17 0936    Subjective "Ok"   Currently in Pain? Yes   Pain Score 3   shins and knees                         [facility] Adult PT Treatment/Exercise - 04/06/17 0001      Ambulation/Gait   Ambulation/Gait Yes   Ambulation/Gait Assistance 7: Independent;6: Modified independent (Device/Increase time)   Ambulation Distance (Feet) --  >557f   Assistive device Rollator   Gait Pattern Within Functional Limits;Step-through pattern   Ambulation Surface Level;Unlevel;Indoor;Outdoor;Paved   Gait velocity slow as pt fatigues   Gait Comments Amb down hal and outside around front island twice. no issues controlling speed amb up and down slopes.     Lumbar Exercises: Aerobic   Tread Mill NuStep Level 4 x 7 minutes     Lumbar Exercises: Standing   Row Theraband;15  reps;Both;Strengthening  x2   Theraband Level (Row) Level 4 (Blue)   Shoulder Extension 15 reps;Theraband;Both  x2   Theraband Level (Shoulder Extension) Level 1 (Yellow)     Knee/Hip Exercises: Seated   Sit to Sand 10 reps;2 sets;without UE support  hoolding red ball                   PT Short Term Goals - 03/16/17 1005      PT SHORT TERM GOAL #1   Title independent with intial HEP   Status Achieved           PT Long Term Goals - 04/06/17 1012      PT LONG TERM GOAL #1   Title decrease TUG to 18 seconds   Status Achieved     PT LONG TERM GOAL #2   Title increase Berg balance test score to 45/56   Status Achieved     PT LONG TERM GOAL #3   Title walk 300 feet without difficulty using the 4WW.   Status Achieved     PT LONG TERM GOAL #4   Title increase right LE strength to 4/5   Status On-going     PT LONG TERM GOAL #5   Title decrease right  LE pain 50%   Status Partially Met               Plan - 04/06/17 1013    Clinical Impression Statement Pt has progressed completing some of her LTG's. Pt able to progress to outdoor amb trial with rollator, going up and down slops on uneven pave surface. Does report some R hip pain during gait trial.   PT Frequency 2x / week   PT Duration 8 weeks   PT Next Visit Plan continue to work on the balance and functional strength      Patient will benefit from skilled therapeutic intervention in order to improve the following deficits and impairments:  Abnormal gait, Cardiopulmonary status limiting activity, Decreased activity tolerance, Decreased balance, Decreased mobility, Decreased strength, Decreased range of motion, Difficulty walking, Pain, Impaired flexibility, Postural dysfunction  Visit Diagnosis: No diagnosis found.     Problem List Patient Active Problem List   Diagnosis Date Noted  . Essential hypertension, benign 09/03/2014  . Morbid obesity ([place]) 03/06/2014  . HTN (hypertension)  03/06/2014  . Vertigo 03/06/2014  . Type II or unspecified type diabetes mellitus without mention of complication, uncontrolled 03/06/2014  . Pure hypercholesterolemia 03/06/2014    Scot Jun, PTA 04/06/2017, 10:14 AM  [place], [place], [address] Phone: [phone]   Fax:  [phone]  Name: Jessica [name] MRN: [id number] Date of Birth: 1946-01-13

## 2017-04-08 ENCOUNTER — Encounter: Payer: Self-pay | Admitting: Physical Therapy

## 2017-04-08 ENCOUNTER — Ambulatory Visit: Payer: Medicare Other | Admitting: Physical Therapy

## 2017-04-08 DIAGNOSIS — G8929 Other chronic pain: Secondary | ICD-10-CM | POA: Diagnosis not present

## 2017-04-08 DIAGNOSIS — M25551 Pain in right hip: Secondary | ICD-10-CM | POA: Diagnosis not present

## 2017-04-08 DIAGNOSIS — M6281 Muscle weakness (generalized): Secondary | ICD-10-CM

## 2017-04-08 DIAGNOSIS — R296 Repeated falls: Secondary | ICD-10-CM | POA: Diagnosis not present

## 2017-04-08 DIAGNOSIS — M25561 Pain in right knee: Secondary | ICD-10-CM | POA: Diagnosis not present

## 2017-04-08 NOTE — Therapy (Signed)
[place], [place], [address] Phone: [phone]   Fax:  [phone]  Physical Therapy Treatment  Patient Details  Name: Jessica [name] MRN: [id number] Date of Birth: 1946-04-24 Referring Provider: W. Addison Lank  Encounter Date: 04/08/2017      PT End of Session - 04/08/17 1011    Visit Number 10   Date for PT Re-Evaluation 05/04/17   PT Start Time 0930   PT Stop Time 1015   PT Time Calculation (min) 45 min      Past Medical History:  Diagnosis Date  . Diabetes mellitus without complication ([place])   . Diastolic dysfunction   . Hyperlipidemia   . Hypertension   . Obesity   . Sleep apnea     Past Surgical History:  Procedure Laterality Date  . CARDIAC CATHETERIZATION     -6/09-normal ejection fraction 65%, no angiographically significant CAD., M. Skains MD  . CESAREAN SECTION    . foot spurs removed    . JOINT REPLACEMENT    . knee spurs removed    . shoulder spurs removed    . TONSILLECTOMY AND ADENOIDECTOMY      There were no vitals filed for this visit.      Subjective Assessment - 04/08/17 0933    Subjective "Back pain is goods today"   Currently in Pain? Yes   Pain Score 3    Pain Location Back   Pain Orientation Right                         [facility] Adult PT Treatment/Exercise - 04/08/17 0001      High Level Balance   High Level Balance Activities Side stepping;Backward walking  NO hand held support   High Level Balance Comments standing ball toss floor & airex     Lumbar Exercises: Aerobic   Tread Mill NuStep Level 4 x 7 minutes     Knee/Hip Exercises: Seated   Long Arc Quad 2 sets;15 reps;Weights;Both   Long Arc Quad Weight 5 lbs.   Hamstring Curl 2 sets;15 reps   Hamstring Limitations geen band   Sit to Sand 10 reps;without UE support;1 set  with ball toss                  PT Short Term Goals - 03/16/17 1005      PT SHORT  TERM GOAL #1   Title independent with intial HEP   Status Achieved           PT Long Term Goals - 04/06/17 1012      PT LONG TERM GOAL #1   Title decrease TUG to 18 seconds   Status Achieved     PT LONG TERM GOAL #2   Title increase Berg balance test score to 45/56   Status Achieved     PT LONG TERM GOAL #3   Title walk 300 feet without difficulty using the 4WW.   Status Achieved     PT LONG TERM GOAL #4   Title increase right LE strength to 4/5   Status On-going     PT LONG TERM GOAL #5   Title decrease right LE pain 50%   Status Partially Met               Plan - 04/08/17 1016    Clinical Impression Statement Pt continues to progress well, able to perform side step and  backwards walking without assist.    Rehab Potential Good   PT Frequency 2x / week   PT Duration 8 weeks   PT Treatment/Interventions Contrast Bath   PT Next Visit Plan continue to work on the balance and functional strength; 2 wk hold      Patient will benefit from skilled therapeutic intervention in order to improve the following deficits and impairments:  Abnormal gait, Cardiopulmonary status limiting activity, Decreased activity tolerance, Decreased balance, Decreased mobility, Decreased strength, Decreased range of motion, Difficulty walking, Pain, Impaired flexibility, Postural dysfunction  Visit Diagnosis: Repeated falls  Muscle weakness (generalized)  Pain in right hip     Problem List Patient Active Problem List   Diagnosis Date Noted  . Essential hypertension, benign 09/03/2014  . Morbid obesity ([place]) 03/06/2014  . HTN (hypertension) 03/06/2014  . Vertigo 03/06/2014  . Type II or unspecified type diabetes mellitus without mention of complication, uncontrolled 03/06/2014  . Pure hypercholesterolemia 03/06/2014    Scot Jun, PTA 04/08/2017, 10:17 AM  [place], [place],  [address] Phone: [phone]   Fax:  [phone]  Name: Jessica [name] MRN: [id number] Date of Birth: 11-25-1945

## 2017-04-15 ENCOUNTER — Ambulatory Visit: Payer: Medicare Other | Admitting: Rehabilitation

## 2017-04-15 DIAGNOSIS — M25561 Pain in right knee: Secondary | ICD-10-CM | POA: Diagnosis not present

## 2017-04-15 DIAGNOSIS — M25551 Pain in right hip: Secondary | ICD-10-CM

## 2017-04-15 DIAGNOSIS — R296 Repeated falls: Secondary | ICD-10-CM

## 2017-04-15 DIAGNOSIS — M6281 Muscle weakness (generalized): Secondary | ICD-10-CM | POA: Diagnosis not present

## 2017-04-15 DIAGNOSIS — G8929 Other chronic pain: Secondary | ICD-10-CM | POA: Diagnosis not present

## 2017-04-15 NOTE — Therapy (Signed)
[place], [place], [address] Phone: [phone]   Fax:  [phone]  Physical Therapy Treatment  Patient Details  Name: Jessica [name] MRN: [id number] Date of Birth: 07-20-1946 Referring Provider: W. Addison Lank  Encounter Date: 04/15/2017      PT End of Session - 04/15/17 0957    Visit Number 11   Date for PT Re-Evaluation 05/04/17   PT Start Time 0930   PT Stop Time 0347   PT Time Calculation (min) 45 min   Activity Tolerance Patient tolerated treatment well      Past Medical History:  Diagnosis Date  . Diabetes mellitus without complication ([place])   . Diastolic dysfunction   . Hyperlipidemia   . Hypertension   . Obesity   . Sleep apnea     Past Surgical History:  Procedure Laterality Date  . CARDIAC CATHETERIZATION     -6/09-normal ejection fraction 65%, no angiographically significant CAD., M. Skains MD  . CESAREAN SECTION    . foot spurs removed    . JOINT REPLACEMENT    . knee spurs removed    . shoulder spurs removed    . TONSILLECTOMY AND ADENOIDECTOMY      There were no vitals filed for this visit.      Subjective Assessment - 04/15/17 0940    Subjective Not feeling sure about my status.  "things are not where they should be"  Will be going to see Dr. Wynelle Link regarding the knee soon.  Still having trouble with weakness in the knee, R hip pain (reports 10/10 pain after exercising) .  "I would like to hold it strong so the knee does not wobble".     Patient Stated Goals improve R knee strength and stability.     Currently in Pain? Yes   Pain Score 4    Pain Location Knee   Pain Orientation Right   Pain Descriptors / Indicators Aching            [facility] PT Assessment - 04/15/17 0001      Functional Tests   Functional tests Step up     ROM / Strength   AROM / PROM / Strength Strength     AROM   Overall AROM Comments AROM of the knees was 8-90 degrees  flexion, AROM of hip flexion right 60 degrees, left 80 degrees     Strength   Strength Assessment Site Knee;Hip   Right/Left Hip Right   Right Hip Flexion 3+/5   Right Hip External Rotation  3+/5   Right Hip Internal Rotation 3+/5   Right Hip ABduction 3+/5   Right/Left Knee Right   Right Knee Flexion 3+/5  due to pain   Right Knee Extension 4-/5  due to pain     Palpation   Palpation comment 2 ttp quad tendon R knee; pt does have a clunk with a kick into tibial ER and valgus reproducing what the patient describes as the "wobbling"     Ambulation/Gait   Gait Comments ambulation with rollator walking                              PT Education - 04/15/17 1031    Education provided Yes   Education Details anatomy of TKR, POC and plan   Person(s) Educated Patient   Methods Explanation;Demonstration   Comprehension Verbalized understanding  PT Short Term Goals - 04/15/17 0947      PT SHORT TERM GOAL #1   Title independent with intial HEP   Status Achieved           PT Long Term Goals - 04/15/17 0947      PT LONG TERM GOAL #1   Title decrease TUG to 18 seconds   Status Achieved     PT LONG TERM GOAL #2   Title increase Berg balance test score to 45/56   Status Achieved     PT LONG TERM GOAL #3   Title walk 300 feet without difficulty using the 4WW.   Status Achieved     PT LONG TERM GOAL #4   Title increase right LE strength to 4/5     PT LONG TERM GOAL #5   Title decrease right LE pain 50%   Status Partially Met               Plan - 04/15/17 1122    Clinical Impression Statement Pt would like to continue PT at this time with focus on R knee and hip strengthening to improve feelings of instability, pain, and abherrant movements at the knee with flexion and extension.  Pt exhibits global knee and hip weakness R>L  some due to pain at the R knee.  Tendeness to palpation R patellar tendon, pain with resisted knee flexion  and extension, and tibial clunk with ER and valgus with passive and active flexion and extension.  pt will be checking in with Dr. Wynelle Link asap but would like to continue with PT at this time until check up.     Rehab Potential Good   PT Frequency 2x / week   PT Duration 4 weeks   PT Next Visit Plan R knee and hip strength, pain relief   Consulted and Agree with Plan of Care Patient      Patient will benefit from skilled therapeutic intervention in order to improve the following deficits and impairments:     Visit Diagnosis: Repeated falls  Muscle weakness (generalized)  Pain in right hip  Chronic pain of right knee     Problem List Patient Active Problem List   Diagnosis Date Noted  . Essential hypertension, benign 09/03/2014  . Morbid obesity ([place]) 03/06/2014  . HTN (hypertension) 03/06/2014  . Vertigo 03/06/2014  . Type II or unspecified type diabetes mellitus without mention of complication, uncontrolled 03/06/2014  . Pure hypercholesterolemia 03/06/2014    Stark Bray, DPT, CMP 04/15/2017, 11:31 AM  [place], [place], [address] Phone: [phone]   Fax:  [phone]  Name: Jessica [name] MRN: [id number] Date of Birth: Nov 23, 1945

## 2017-04-18 ENCOUNTER — Encounter: Payer: Medicare Other | Admitting: Physical Therapy

## 2017-04-19 ENCOUNTER — Encounter: Payer: Self-pay | Admitting: Physical Therapy

## 2017-04-19 ENCOUNTER — Ambulatory Visit: Payer: Medicare Other | Admitting: Physical Therapy

## 2017-04-19 DIAGNOSIS — R296 Repeated falls: Secondary | ICD-10-CM | POA: Diagnosis not present

## 2017-04-19 DIAGNOSIS — M25551 Pain in right hip: Secondary | ICD-10-CM | POA: Diagnosis not present

## 2017-04-19 DIAGNOSIS — G8929 Other chronic pain: Secondary | ICD-10-CM

## 2017-04-19 DIAGNOSIS — M25561 Pain in right knee: Secondary | ICD-10-CM

## 2017-04-19 DIAGNOSIS — M6281 Muscle weakness (generalized): Secondary | ICD-10-CM | POA: Diagnosis not present

## 2017-04-19 NOTE — Therapy (Signed)
[place], [place], [address] Phone: [phone]   Fax:  [phone]  Physical Therapy Treatment  Patient Details  Name: Jessica [name] MRN: [id number] Date of Birth: [birth year] Referring Provider: W. Addison Lank  Encounter Date: 04/19/2017      PT End of Session - 04/19/17 1005    Visit Number 12   Date for PT Re-Evaluation 05/04/17   PT Start Time 0926   PT Stop Time 1013   PT Time Calculation (min) 47 min   Activity Tolerance Patient tolerated treatment well   Behavior During Therapy [facility] for tasks assessed/performed      Past Medical History:  Diagnosis Date  . Diabetes mellitus without complication ([place])   . Diastolic dysfunction   . Hyperlipidemia   . Hypertension   . Obesity   . Sleep apnea     Past Surgical History:  Procedure Laterality Date  . CARDIAC CATHETERIZATION     -6/09-normal ejection fraction 65%, no angiographically significant CAD., M. Skains MD  . CESAREAN SECTION    . foot spurs removed    . JOINT REPLACEMENT    . knee spurs removed    . shoulder spurs removed    . TONSILLECTOMY AND ADENOIDECTOMY      There were no vitals filed for this visit.      Subjective Assessment - 04/19/17 0931    Subjective She is continuing to wait to get appointment with orthopod.  Patient reports that she was not feeling well over the weekend and did not get out of bed much   Currently in Pain? Yes   Pain Score 4    Pain Location Knee   Pain Orientation Right                         [facility] Adult PT Treatment/Exercise - 04/19/17 0001      High Level Balance   High Level Balance Activities Side stepping;Backward walking   High Level Balance Comments standing ball toss floor & airex, ball kicks     Lumbar Exercises: Aerobic   Tread Mill NuStep Level 5 x 6 minutes     Knee/Hip Exercises: Machines for Strengthening   Cybex Knee Extension 5# 2x10   Cybex  Knee Flexion 20# 2x10     Knee/Hip Exercises: Standing   Hip Flexion 20 reps   Hip Flexion Limitations 3#   Hip Abduction 20 reps   Abduction Limitations 3#     Knee/Hip Exercises: Seated   Other Seated Knee/Hip Exercises red tband ankle DF, overhead ball reach for core stability   Sit to Sand 10 reps;without UE support;1 set  holding 6.6# ball                  PT Short Term Goals - 04/15/17 0947      PT SHORT TERM GOAL #1   Title independent with intial HEP   Status Achieved           PT Long Term Goals - 04/15/17 0947      PT LONG TERM GOAL #1   Title decrease TUG to 18 seconds   Status Achieved     PT LONG TERM GOAL #2   Title increase Berg balance test score to 45/56   Status Achieved     PT LONG TERM GOAL #3   Title walk 300 feet without difficulty using the 4WW.   Status Achieved  PT LONG TERM GOAL #4   Title increase right LE strength to 4/5     PT LONG TERM GOAL #5   Title decrease right LE pain 50%   Status Partially Met               Plan - 04/19/17 1006    Clinical Impression Statement Added some different higher level exercises and patient did very well.  Continues to have weakness and had great difficulty with balance on airex with ball toss   PT Next Visit Plan R knee and hip strength, pain relief   Consulted and Agree with Plan of Care Patient      Patient will benefit from skilled therapeutic intervention in order to improve the following deficits and impairments:  Abnormal gait, Cardiopulmonary status limiting activity, Decreased activity tolerance, Decreased balance, Decreased mobility, Decreased strength, Decreased range of motion, Difficulty walking, Pain, Impaired flexibility, Postural dysfunction  Visit Diagnosis: Repeated falls  Muscle weakness (generalized)  Pain in right hip  Chronic pain of right knee     Problem List Patient Active Problem List   Diagnosis Date Noted  . Essential hypertension,  benign 09/03/2014  . Morbid obesity ([place]) 03/06/2014  . HTN (hypertension) 03/06/2014  . Vertigo 03/06/2014  . Type II or unspecified type diabetes mellitus without mention of complication, uncontrolled 03/06/2014  . Pure hypercholesterolemia 03/06/2014    Sumner Boast., PT 04/19/2017, 10:07 AM  [place], [place], [address] Phone: [phone]   Fax:  [phone]  Name: Jessica [name] MRN: [id number] Date of Birth: 04/09/1946

## 2017-04-22 ENCOUNTER — Encounter: Payer: Self-pay | Admitting: Physical Therapy

## 2017-04-22 ENCOUNTER — Ambulatory Visit: Payer: Medicare Other | Admitting: Physical Therapy

## 2017-04-22 DIAGNOSIS — M25551 Pain in right hip: Secondary | ICD-10-CM | POA: Diagnosis not present

## 2017-04-22 DIAGNOSIS — M6281 Muscle weakness (generalized): Secondary | ICD-10-CM

## 2017-04-22 DIAGNOSIS — M25561 Pain in right knee: Secondary | ICD-10-CM

## 2017-04-22 DIAGNOSIS — R296 Repeated falls: Secondary | ICD-10-CM

## 2017-04-22 DIAGNOSIS — G8929 Other chronic pain: Secondary | ICD-10-CM

## 2017-04-22 NOTE — Therapy (Signed)
[place], [place], [address] Phone: [phone]   Fax:  [phone]  Physical Therapy Treatment  Patient Details  Name: Jessica [name] MRN: [id number] Date of Birth: 1945/12/12 Referring Provider: W. Addison Lank  Encounter Date: 04/22/2017      PT End of Session - 04/22/17 0910    Visit Number 13   Date for PT Re-Evaluation 05/04/17   PT Start Time 0830   PT Stop Time 0916   PT Time Calculation (min) 46 min      Past Medical History:  Diagnosis Date  . Diabetes mellitus without complication ([place])   . Diastolic dysfunction   . Hyperlipidemia   . Hypertension   . Obesity   . Sleep apnea     Past Surgical History:  Procedure Laterality Date  . CARDIAC CATHETERIZATION     -6/09-normal ejection fraction 65%, no angiographically significant CAD., M. Skains MD  . CESAREAN SECTION    . foot spurs removed    . JOINT REPLACEMENT    . knee spurs removed    . shoulder spurs removed    . TONSILLECTOMY AND ADENOIDECTOMY      There were no vitals filed for this visit.      Subjective Assessment - 04/22/17 0832    Subjective okay- just the normal pain that probably will not go away   Currently in Pain? Yes   Pain Score 4    Pain Location Knee   Pain Orientation Right                         [facility] Adult PT Treatment/Exercise - 04/22/17 0001      Lumbar Exercises: Aerobic   Tread Mill NuStep Level 5 x 6 minutes     Lumbar Exercises: Standing   Other Standing Lumbar Exercises on airex with walker 15 reps each 3# marching,hip 3 way, HS curl , heel raises  for strength and balance     Knee/Hip Exercises: Machines for Strengthening   Cybex Knee Extension 5# 2x10   Cybex Knee Flexion 20# 2x10     Knee/Hip Exercises: Standing   Forward Step Up Both;1 set;10 reps;Hand Hold: 2;Step Height: 4"   Forward Step Up Limitations 6 in fwd step touch alt 2 sets 20     Knee/Hip  Exercises: Seated   Ball Squeeze 20 reps   Clamshell with TheraBand Green  20   Sit to [employer] 10 reps;without UE support;1 set                  PT Short Term Goals - 04/15/17 0947      PT SHORT TERM GOAL #1   Title independent with intial HEP   Status Achieved           PT Long Term Goals - 04/22/17 0901      PT LONG TERM GOAL #4   Title increase right LE strength to 4/5   Status On-going     PT LONG TERM GOAL #5   Title decrease right LE pain 50%   Status Partially Met               Plan - 04/22/17 0911    Clinical Impression Statement pt tolerated increased strengthening ex without pain and stated increased ease/stability. progressing with goals   PT Next Visit Plan MD Aug 30th with Alusio, continue to progress ther ex  Patient will benefit from skilled therapeutic intervention in order to improve the following deficits and impairments:  Abnormal gait, Cardiopulmonary status limiting activity, Decreased activity tolerance, Decreased balance, Decreased mobility, Decreased strength, Decreased range of motion, Difficulty walking, Pain, Impaired flexibility, Postural dysfunction  Visit Diagnosis: Repeated falls  Muscle weakness (generalized)  Pain in right hip  Chronic pain of right knee     Problem List Patient Active Problem List   Diagnosis Date Noted  . Essential hypertension, benign 09/03/2014  . Morbid obesity ([place]) 03/06/2014  . HTN (hypertension) 03/06/2014  . Vertigo 03/06/2014  . Type II or unspecified type diabetes mellitus without mention of complication, uncontrolled 03/06/2014  . Pure hypercholesterolemia 03/06/2014    PAYSEUR,ANGIE PTA 04/22/2017, 9:12 AM  [place], [place], [address] Phone: [phone]   Fax:  [phone]  Name: Jessica [name] MRN: [id number] Date of Birth: 30-Jul-1946

## 2017-04-25 ENCOUNTER — Ambulatory Visit: Payer: Medicare Other | Attending: Family Medicine | Admitting: Physical Therapy

## 2017-04-25 ENCOUNTER — Encounter: Payer: Self-pay | Admitting: Physical Therapy

## 2017-04-25 DIAGNOSIS — M25561 Pain in right knee: Secondary | ICD-10-CM | POA: Insufficient documentation

## 2017-04-25 DIAGNOSIS — M6281 Muscle weakness (generalized): Secondary | ICD-10-CM | POA: Diagnosis not present

## 2017-04-25 DIAGNOSIS — G8929 Other chronic pain: Secondary | ICD-10-CM | POA: Diagnosis not present

## 2017-04-25 DIAGNOSIS — R296 Repeated falls: Secondary | ICD-10-CM | POA: Diagnosis not present

## 2017-04-25 DIAGNOSIS — M25551 Pain in right hip: Secondary | ICD-10-CM | POA: Insufficient documentation

## 2017-04-25 NOTE — Therapy (Signed)
[place], [place], [address] Phone: [phone]   Fax:  [phone]  Physical Therapy Treatment  Patient Details  Name: GRANT HENKES MRN: [id number] Date of Birth: 1946-09-10 Referring Provider: W. Addison Lank  Encounter Date: 04/25/2017      PT End of Session - 04/25/17 1009    Visit Number 14   Date for PT Re-Evaluation 05/04/17   PT Start Time 0916   PT Stop Time 1004   PT Time Calculation (min) 48 min   Behavior During Therapy [facility] for tasks assessed/performed      Past Medical History:  Diagnosis Date  . Diabetes mellitus without complication ([place])   . Diastolic dysfunction   . Hyperlipidemia   . Hypertension   . Obesity   . Sleep apnea     Past Surgical History:  Procedure Laterality Date  . CARDIAC CATHETERIZATION     -6/09-normal ejection fraction 65%, no angiographically significant CAD., M. Skains MD  . CESAREAN SECTION    . foot spurs removed    . JOINT REPLACEMENT    . knee spurs removed    . shoulder spurs removed    . TONSILLECTOMY AND ADENOIDECTOMY      There were no vitals filed for this visit.      Subjective Assessment - 04/25/17 0948    Subjective Doing pretty good.   Currently in Pain? Yes   Pain Score 2    Pain Location Knee   Pain Orientation Right   Pain Descriptors / Indicators Aching                         [facility] Adult PT Treatment/Exercise - 04/25/17 0001      Ambulation/Gait   Gait Comments resisted gait fwd     Lumbar Exercises: Aerobic   Tread Mill NuStep Level 5 x 7 minutes     Lumbar Exercises: Standing   Other Standing Lumbar Exercises green tband HS curls and scapulat stabilization   Other Standing Lumbar Exercises seated ball squeeze b/n knees, hip abduction with green tband,  standing 4" toe touches with 3# weight on ankles     Lumbar Exercises: Seated   Long Arc Quad on Chair Strengthening;Right;2 sets;10  reps;Weights   LAQ on Chair Weights (lbs) 3#   Long [employer] on [place] 20 reps   LAQ on [place] Weights (lbs) ball b/n knees     Lumbar Exercises: Supine   Other Supine Lumbar Exercises feet on bal K2C, trunk rotation, small bridges and abdominal isometricsl                  PT Short Term Goals - 04/15/17 0947      PT SHORT TERM GOAL #1   Title independent with intial HEP   Status Achieved           PT Long Term Goals - 04/22/17 0901      PT LONG TERM GOAL #4   Title increase right LE strength to 4/5   Status On-going     PT LONG TERM GOAL #5   Title decrease right LE pain 50%   Status Partially Met               Plan - 04/25/17 1010    Clinical Impression Statement Patient at times has some right knee instability, a large"clunking" and rotation when going from flexion to extension.   PT Next  Visit Plan continue to work on function and strength   Consulted and Agree with Plan of Care Patient      Patient will benefit from skilled therapeutic intervention in order to improve the following deficits and impairments:  Abnormal gait, Cardiopulmonary status limiting activity, Decreased activity tolerance, Decreased balance, Decreased mobility, Decreased strength, Decreased range of motion, Difficulty walking, Pain, Impaired flexibility, Postural dysfunction  Visit Diagnosis: Repeated falls  Muscle weakness (generalized)  Pain in right hip  Chronic pain of right knee     Problem List Patient Active Problem List   Diagnosis Date Noted  . Essential hypertension, benign 09/03/2014  . Morbid obesity ([place]) 03/06/2014  . HTN (hypertension) 03/06/2014  . Vertigo 03/06/2014  . Type II or unspecified type diabetes mellitus without mention of complication, uncontrolled 03/06/2014  . Pure hypercholesterolemia 03/06/2014    Sumner Boast., PT 04/25/2017, 10:12 AM  [place], [place], [address] Phone: [phone]   Fax:  [phone]  Name: AHNIYAH GIANCOLA MRN: [id number] Date of Birth: Aug 11, 1946

## 2017-04-28 ENCOUNTER — Ambulatory Visit: Payer: Medicare Other | Admitting: Physical Therapy

## 2017-04-28 ENCOUNTER — Encounter: Payer: Self-pay | Admitting: Physical Therapy

## 2017-04-28 DIAGNOSIS — G8929 Other chronic pain: Secondary | ICD-10-CM | POA: Diagnosis not present

## 2017-04-28 DIAGNOSIS — M25551 Pain in right hip: Secondary | ICD-10-CM

## 2017-04-28 DIAGNOSIS — R296 Repeated falls: Secondary | ICD-10-CM

## 2017-04-28 DIAGNOSIS — M6281 Muscle weakness (generalized): Secondary | ICD-10-CM | POA: Diagnosis not present

## 2017-04-28 DIAGNOSIS — M25561 Pain in right knee: Secondary | ICD-10-CM

## 2017-04-28 NOTE — Therapy (Signed)
[place], [place], [address] Phone: [phone]   Fax:  [phone]  Physical Therapy Treatment  Patient Details  Name: Jessica [name] MRN: [id number] Date of Birth: 01-31-1946 Referring Provider: W. Addison Lank  Encounter Date: 04/28/2017      PT End of Session - 04/28/17 1002    Visit Number 15   Date for PT Re-Evaluation 05/04/17   PT Start Time 0930   PT Stop Time 1015   PT Time Calculation (min) 45 min      Past Medical History:  Diagnosis Date  . Diabetes mellitus without complication ([place])   . Diastolic dysfunction   . Hyperlipidemia   . Hypertension   . Obesity   . Sleep apnea     Past Surgical History:  Procedure Laterality Date  . CARDIAC CATHETERIZATION     -6/09-normal ejection fraction 65%, no angiographically significant CAD., M. Skains MD  . CESAREAN SECTION    . foot spurs removed    . JOINT REPLACEMENT    . knee spurs removed    . shoulder spurs removed    . TONSILLECTOMY AND ADENOIDECTOMY      There were no vitals filed for this visit.      Subjective Assessment - 04/28/17 0931    Subjective rushing this morning- tired and hurting   Currently in Pain? Yes   Pain Score 5    Pain Location Leg   Pain Orientation Right                         [facility] Adult PT Treatment/Exercise - 04/28/17 0001      Lumbar Exercises: Aerobic   Tread Mill NuStep Level 5 x 7 minutes     Knee/Hip Exercises: Machines for Strengthening   Cybex Knee Extension 5# 3x10   Cybex Knee Flexion 20# 3x10     Knee/Hip Exercises: Standing   Other Standing Knee Exercises green tband 15 reps each hip flex,ext and abd     Knee/Hip Exercises: Seated   Long Arc Quad Strengthening;20 reps;Both  green tband   Ball Squeeze 20 reps   Clamshell with TheraBand Green  hip abd   Marching 20 reps  green tband   Hamstring Curl Strengthening;Both;20 reps  green tband   Sit to  Sand 3 sets;5 reps;without UE support  ball btwn knees                  PT Short Term Goals - 04/15/17 0947      PT SHORT TERM GOAL #1   Title independent with intial HEP   Status Achieved           PT Long Term Goals - 04/28/17 1003      PT LONG TERM GOAL #4   Title increase right LE strength to 4/5   Baseline resisted flex and ext RT knee abd distal   Status On-going     PT LONG TERM GOAL #5   Title decrease right LE pain 50%   Status On-going               Plan - 04/28/17 1002    Clinical Impression Statement pt with some increased pain today but mostly c/o fatigue from holiday yesterday and not sleeping well. weakness and decreased stability on RT knee. strength and gait goal not met but progressing.   PT Next Visit Plan continue to work on function  and strength      Patient will benefit from skilled therapeutic intervention in order to improve the following deficits and impairments:     Visit Diagnosis: Repeated falls  Muscle weakness (generalized)  Pain in right hip  Chronic pain of right knee     Problem List Patient Active Problem List   Diagnosis Date Noted  . Essential hypertension, benign 09/03/2014  . Morbid obesity ([place]) 03/06/2014  . HTN (hypertension) 03/06/2014  . Vertigo 03/06/2014  . Type II or unspecified type diabetes mellitus without mention of complication, uncontrolled 03/06/2014  . Pure hypercholesterolemia 03/06/2014    PAYSEUR,ANGIE PTA 04/28/2017, 10:07 AM  [place], [place], [address] Phone: [phone]   Fax:  [phone]  Name: Jessica [name] MRN: [id number] Date of Birth: 1946/08/31

## 2017-05-03 ENCOUNTER — Ambulatory Visit: Payer: Medicare Other | Admitting: Physical Therapy

## 2017-05-04 ENCOUNTER — Encounter: Payer: Self-pay | Admitting: Physical Therapy

## 2017-05-04 ENCOUNTER — Ambulatory Visit: Payer: Medicare Other | Admitting: Physical Therapy

## 2017-05-04 DIAGNOSIS — R296 Repeated falls: Secondary | ICD-10-CM | POA: Diagnosis not present

## 2017-05-04 DIAGNOSIS — M6281 Muscle weakness (generalized): Secondary | ICD-10-CM

## 2017-05-04 DIAGNOSIS — G8929 Other chronic pain: Secondary | ICD-10-CM | POA: Diagnosis not present

## 2017-05-04 DIAGNOSIS — M25551 Pain in right hip: Secondary | ICD-10-CM | POA: Diagnosis not present

## 2017-05-04 DIAGNOSIS — M25561 Pain in right knee: Secondary | ICD-10-CM | POA: Diagnosis not present

## 2017-05-04 NOTE — Therapy (Signed)
[place], [place], [address] Phone: [phone]   Fax:  [phone]  Physical Therapy Treatment  Patient Details  Name: ALEEA HENDRY MRN: [id number] Date of Birth: [birth year] Referring Provider: W. Addison Lank  Encounter Date: 05/04/2017      PT End of Session - 05/04/17 1641    Visit Number 16   Date for PT Re-Evaluation 05/04/17   PT Start Time 1558   PT Stop Time 1642   PT Time Calculation (min) 44 min   Activity Tolerance Patient tolerated treatment well   Behavior During Therapy [facility] for tasks assessed/performed      Past Medical History:  Diagnosis Date  . Diabetes mellitus without complication ([place])   . Diastolic dysfunction   . Hyperlipidemia   . Hypertension   . Obesity   . Sleep apnea     Past Surgical History:  Procedure Laterality Date  . CARDIAC CATHETERIZATION     -6/09-normal ejection fraction 65%, no angiographically significant CAD., M. Skains MD  . CESAREAN SECTION    . foot spurs removed    . JOINT REPLACEMENT    . knee spurs removed    . shoulder spurs removed    . TONSILLECTOMY AND ADENOIDECTOMY      There were no vitals filed for this visit.      Subjective Assessment - 05/04/17 1619    Subjective Pt reports that things are going good   Pain Score 5    Pain Orientation Right;Medial            [facility] PT Assessment - 05/04/17 0001      Strength   Right Knee Flexion 4-/5   Right Knee Extension 4-/5                     [facility] Adult PT Treatment/Exercise - 05/04/17 0001      Lumbar Exercises: Aerobic   Tread Mill NuStep Level 5 x 7 minutes     Lumbar Exercises: Seated   Sit to Stand 15 reps  x2, without UE     Knee/Hip Exercises: Machines for Strengthening   Cybex Knee Extension 5# 2x10   Cybex Knee Flexion 20# 3x10     Knee/Hip Exercises: Standing   Hip Abduction 20 reps   Abduction Limitations 5lb    Forward Step Up  Both;1 set;Hand Hold: 2;Step Height: 6";5 reps  She was unwilling to step up with RLE   Other Standing Knee Exercises standing march 5lb 2x10     Knee/Hip Exercises: Seated   Long Arc Quad Strengthening;Both;2 sets;10 reps   Long Arc Quad Weight 5 lbs.   Ball Squeeze 20 reps   Clamshell with TheraBand Green  2x15    Hamstring Curl Strengthening;2 sets;10 reps   Hamstring Limitations geen band                  PT Short Term Goals - 04/15/17 0947      PT SHORT TERM GOAL #1   Title independent with intial HEP   Status Achieved           PT Long Term Goals - 05/04/17 1645      PT LONG TERM GOAL #4   Title increase right LE strength to 4/5   Status On-going     PT LONG TERM GOAL #5   Title decrease right LE pain 50%   Status On-going  Plan - 05/04/17 1642    Clinical Impression Statement Pt does require some cues to stay on task and count reps. Weakness and decrease stability in RLE remains. Pt unwilling to attempt 6 inch step up with RLE.    Rehab Potential Good   PT Frequency 2x / week   PT Duration 4 weeks   PT Next Visit Plan continue to work on function and strength      Patient will benefit from skilled therapeutic intervention in order to improve the following deficits and impairments:  Abnormal gait, Cardiopulmonary status limiting activity, Decreased activity tolerance, Decreased balance, Decreased mobility, Decreased strength, Decreased range of motion, Difficulty walking, Pain, Impaired flexibility, Postural dysfunction  Visit Diagnosis: Repeated falls  Muscle weakness (generalized)  Pain in right hip     Problem List Patient Active Problem List   Diagnosis Date Noted  . Essential hypertension, benign 09/03/2014  . Morbid obesity ([place]) 03/06/2014  . HTN (hypertension) 03/06/2014  . Vertigo 03/06/2014  . Type II or unspecified type diabetes mellitus without mention of complication, uncontrolled 03/06/2014  . Pure  hypercholesterolemia 03/06/2014    Scot Jun, PTA 05/04/2017, 4:47 PM  [place], [place], [address] Phone: [phone]   Fax:  [phone]  Name: ARIS MOMAN MRN: [id number] Date of Birth: 1946-07-23

## 2017-05-04 NOTE — Addendum Note (Signed)
Addended by: Sumner Boast on: 05/04/2017 05:54 PM   Modules accepted: Orders

## 2017-05-05 ENCOUNTER — Ambulatory Visit: Payer: Medicare Other | Admitting: Physical Therapy

## 2017-05-05 ENCOUNTER — Encounter: Payer: Self-pay | Admitting: Physical Therapy

## 2017-05-05 DIAGNOSIS — L602 Onychogryphosis: Secondary | ICD-10-CM | POA: Diagnosis not present

## 2017-05-05 DIAGNOSIS — M25561 Pain in right knee: Secondary | ICD-10-CM

## 2017-05-05 DIAGNOSIS — G8929 Other chronic pain: Secondary | ICD-10-CM

## 2017-05-05 DIAGNOSIS — R296 Repeated falls: Secondary | ICD-10-CM

## 2017-05-05 DIAGNOSIS — M6281 Muscle weakness (generalized): Secondary | ICD-10-CM | POA: Diagnosis not present

## 2017-05-05 DIAGNOSIS — E1351 Other specified diabetes mellitus with diabetic peripheral angiopathy without gangrene: Secondary | ICD-10-CM | POA: Diagnosis not present

## 2017-05-05 DIAGNOSIS — M25551 Pain in right hip: Secondary | ICD-10-CM

## 2017-05-05 NOTE — Therapy (Signed)
[place], [place], [address] Phone: [phone]   Fax:  [phone]  Physical Therapy Treatment  Patient Details  Name: ARYA LUTTRULL MRN: [id number] Date of Birth: 10/04/1946 Referring Provider: W. Addison Lank  Encounter Date: 05/05/2017      PT End of Session - 05/05/17 0943    Visit Number 17   Date for PT Re-Evaluation 06/04/17   PT Start Time 0920   PT Stop Time 1010   PT Time Calculation (min) 50 min      Past Medical History:  Diagnosis Date  . Diabetes mellitus without complication ([place])   . Diastolic dysfunction   . Hyperlipidemia   . Hypertension   . Obesity   . Sleep apnea     Past Surgical History:  Procedure Laterality Date  . CARDIAC CATHETERIZATION     -6/09-normal ejection fraction 65%, no angiographically significant CAD., M. Skains MD  . CESAREAN SECTION    . foot spurs removed    . JOINT REPLACEMENT    . knee spurs removed    . shoulder spurs removed    . TONSILLECTOMY AND ADENOIDECTOMY      There were no vitals filed for this visit.      Subjective Assessment - 05/05/17 0920    Subjective yesterdays session worked it out, feeling better today    Currently in Pain? Yes   Pain Score 1    Pain Orientation Right                         [facility] Adult PT Treatment/Exercise - 05/05/17 0001      Lumbar Exercises: Aerobic   Tread Mill NuStep Level 5 x 7 minutes     Knee/Hip Exercises: Standing   Forward Step Up Both;2 sets;5 sets;Hand Hold: 2;Step Height: 6"   Other Standing Knee Exercises green tband on airex, 2 Hand hold 15 reps. marching, hip flex,ext and abd   Other Standing Knee Exercises standing 5 # 6 inch tap with walker 2 sets 10 fwd and lat     Knee/Hip Exercises: Seated   Long Arc Quad Both;2 sets;15 reps;Weights   Long Arc Quad Weight 5 lbs.   Ball Squeeze 20 reps   Clamshell with TheraBand Green   Hamstring Curl  Right;Both;2 sets;15 reps;Weights    Hamstring Weights 5 lbs.   Sit to Sand 3 sets;5 reps;without UE support  ball squeeze                  PT Short Term Goals - 04/15/17 0947      PT SHORT TERM GOAL #1   Title independent with intial HEP   Status Achieved           PT Long Term Goals - 05/04/17 1645      PT LONG TERM GOAL #4   Title increase right LE strength to 4/5   Status On-going     PT LONG TERM GOAL #5   Title decrease right LE pain 50%   Status On-going               Plan - 05/05/17 0950    Clinical Impression Statement pt tolerated ther ex well this morning, verb mornings are better. pt able to complete 6 inch step ups. hip weakness noted   PT Next Visit Plan pt unsure if she sees [facility] July 30 or Aug 30?? Pt to check as  she may need to transition to independant as to not use all visit in the event of further surgery on knee      Patient will benefit from skilled therapeutic intervention in order to improve the following deficits and impairments:     Visit Diagnosis: Repeated falls  Muscle weakness (generalized)  Pain in right hip  Chronic pain of right knee     Problem List Patient Active Problem List   Diagnosis Date Noted  . Essential hypertension, benign 09/03/2014  . Morbid obesity ([place]) 03/06/2014  . HTN (hypertension) 03/06/2014  . Vertigo 03/06/2014  . Type II or unspecified type diabetes mellitus without mention of complication, uncontrolled 03/06/2014  . Pure hypercholesterolemia 03/06/2014    Brittanee Ghazarian,ANGIE PTA 05/05/2017, 10:05 AM  [place], [place], [address] Phone: [phone]   Fax:  [phone]  Name: MERIT GADSBY MRN: [id number] Date of Birth: 1946/02/23

## 2017-05-09 ENCOUNTER — Ambulatory Visit: Payer: Medicare Other | Admitting: Physical Therapy

## 2017-05-11 DIAGNOSIS — N950 Postmenopausal bleeding: Secondary | ICD-10-CM | POA: Diagnosis not present

## 2017-05-12 ENCOUNTER — Encounter: Payer: Self-pay | Admitting: Physical Therapy

## 2017-05-12 ENCOUNTER — Ambulatory Visit: Payer: Medicare Other | Admitting: Physical Therapy

## 2017-05-12 DIAGNOSIS — G8929 Other chronic pain: Secondary | ICD-10-CM | POA: Diagnosis not present

## 2017-05-12 DIAGNOSIS — M25561 Pain in right knee: Secondary | ICD-10-CM

## 2017-05-12 DIAGNOSIS — R296 Repeated falls: Secondary | ICD-10-CM | POA: Diagnosis not present

## 2017-05-12 DIAGNOSIS — M25551 Pain in right hip: Secondary | ICD-10-CM | POA: Diagnosis not present

## 2017-05-12 DIAGNOSIS — M6281 Muscle weakness (generalized): Secondary | ICD-10-CM | POA: Diagnosis not present

## 2017-05-12 NOTE — Therapy (Signed)
[place], [place], [address] Phone: [phone]   Fax:  [phone]  Physical Therapy Treatment  Patient Details  Name: Jessica [name] MRN: [id number] Date of Birth: 1946-05-17 Referring Provider: W. Addison [name]  Encounter Date: 05/12/2017      PT End of Session - 05/12/17 0933    Visit Number 18   Date for PT Re-Evaluation 06/04/17   PT Start Time 0930   PT Stop Time 1020   PT Time Calculation (min) 50 min      Past Medical History:  Diagnosis Date  . Diabetes mellitus without complication (Jessica [name])   . Diastolic dysfunction   . Hyperlipidemia   . Hypertension   . Obesity   . Sleep apnea     Past Surgical History:  Procedure Laterality Date  . CARDIAC CATHETERIZATION     -6/09-normal ejection fraction 65%, no angiographically significant CAD., M. Skains MD  . CESAREAN SECTION    . foot spurs removed    . JOINT REPLACEMENT    . knee spurs removed    . shoulder spurs removed    . TONSILLECTOMY AND ADENOIDECTOMY      There were no vitals filed for this visit.      Subjective Assessment - 05/12/17 0933    Subjective MD Aug 30 so lets D/C to independant incase I need more surgery   Currently in Pain? Yes   Pain Score 2    Pain Location Leg                         [facility] Adult PT Treatment/Exercise - 05/12/17 0001      Lumbar Exercises: Aerobic   Tread Mill NuStep Level 5 x 8 minutes     Knee/Hip Exercises: Machines for Strengthening   Cybex Knee Extension 5# 3x10   Cybex Knee Flexion 20# 3x10     Knee/Hip Exercises: Standing   Other Standing Knee Exercises 5# marching, hip 3 way     Knee/Hip Exercises: Seated   Long Arc Quad Both;2 sets;15 reps;Weights   Long Arc Quad Weight 5 lbs.   Ball Squeeze 20 reps   Clamshell with Marga Hoots   Other Seated Knee/Hip Exercises 5# marching, hip abd and LAQ   Sit to Sand 2 sets;10 reps;without UE support                 PT Education - 05/12/17 0944    Education provided Yes   Education Details educated and issued Handout for transition to gym program   Person(s) Educated Patient   Methods Explanation   Comprehension Verbalized understanding;Returned demonstration          PT Short Term Goals - 04/15/17 0947      PT SHORT TERM GOAL #1   Title independent with intial HEP   Status Achieved           PT Long Term Goals - 05/12/17 0942      PT LONG TERM GOAL #1   Title decrease TUG to 18 seconds   Status Achieved     PT LONG TERM GOAL #2   Title increase Berg balance test score to 45/56   Status Achieved     PT LONG TERM GOAL #3   Title walk 300 feet without difficulty using the 4WW.   Status Achieved     PT LONG TERM GOAL #4   Title increase right LE  strength to 4/5   Status Partially Met     PT LONG TERM GOAL #5   Title decrease right LE pain 50%   Status Partially Met               Plan - 05/12/17 1000    Clinical Impression Statement goals partail met, will D/C today to independant ex program as pt sees Ortho 8/30 and if needs further surgery and PT will have visits left   PT Next Visit Plan D/C      Patient will benefit from skilled therapeutic intervention in order to improve the following deficits and impairments:     Visit Diagnosis: Muscle weakness (generalized)  Pain in right hip  Chronic pain of right knee     Problem List Patient Active Problem List   Diagnosis Date Noted  . Essential hypertension, benign 09/03/2014  . Morbid obesity ([place]) 03/06/2014  . HTN (hypertension) 03/06/2014  . Vertigo 03/06/2014  . Type II or unspecified type diabetes mellitus without mention of complication, uncontrolled 03/06/2014  . Pure hypercholesterolemia 03/06/2014   PHYSICAL THERAPY DISCHARGE SUMMARY    Plan: Patient agrees to discharge.  Patient goals were partially met. Patient is being discharged due to being pleased with the  current functional level.  ?????      Jessica [name],Jessica [name] 05/12/2017, 10:01 AM  [place], [place], [address] Phone: [phone]   Fax:  [phone]  Name: Jessica [name] MRN: [id number] Date of Birth: [birth year]

## 2017-05-16 ENCOUNTER — Ambulatory Visit: Payer: Medicare Other | Admitting: Physical Therapy

## 2017-05-19 ENCOUNTER — Ambulatory Visit: Payer: Medicare Other | Admitting: Physical Therapy

## 2017-05-30 DIAGNOSIS — E1165 Type 2 diabetes mellitus with hyperglycemia: Secondary | ICD-10-CM | POA: Diagnosis not present

## 2017-05-30 DIAGNOSIS — E7800 Pure hypercholesterolemia, unspecified: Secondary | ICD-10-CM | POA: Diagnosis not present

## 2017-05-30 DIAGNOSIS — E669 Obesity, unspecified: Secondary | ICD-10-CM | POA: Diagnosis not present

## 2017-05-30 DIAGNOSIS — I10 Essential (primary) hypertension: Secondary | ICD-10-CM | POA: Diagnosis not present

## 2017-05-30 DIAGNOSIS — G609 Hereditary and idiopathic neuropathy, unspecified: Secondary | ICD-10-CM | POA: Diagnosis not present

## 2017-06-03 ENCOUNTER — Other Ambulatory Visit: Payer: Self-pay | Admitting: Cardiology

## 2017-06-06 ENCOUNTER — Encounter: Payer: Self-pay | Admitting: Cardiology

## 2017-06-09 DIAGNOSIS — H401211 Low-tension glaucoma, right eye, mild stage: Secondary | ICD-10-CM | POA: Diagnosis not present

## 2017-06-09 DIAGNOSIS — E1165 Type 2 diabetes mellitus with hyperglycemia: Secondary | ICD-10-CM | POA: Diagnosis not present

## 2017-06-09 DIAGNOSIS — H401222 Low-tension glaucoma, left eye, moderate stage: Secondary | ICD-10-CM | POA: Diagnosis not present

## 2017-06-09 DIAGNOSIS — H40033 Anatomical narrow angle, bilateral: Secondary | ICD-10-CM | POA: Diagnosis not present

## 2017-06-13 DIAGNOSIS — N950 Postmenopausal bleeding: Secondary | ICD-10-CM | POA: Diagnosis not present

## 2017-06-13 DIAGNOSIS — N841 Polyp of cervix uteri: Secondary | ICD-10-CM | POA: Diagnosis not present

## 2017-06-17 NOTE — Patient Instructions (Signed)
Your procedure is scheduled on:  Tuesday, Sept. 4, 2018  Enter through the [employer] [facility] at:  11:30 AM  Pick up the phone at the desk and dial [phone].  Call this number if you have problems the morning of surgery: [phone].  Remember: Do NOT eat food:  After Midnight Monday  Do NOT drink clear liquids after:  7:00 AM Tuesday  Take these medicines the morning of surgery with a SIP OF WATER:  Losartan, Metoprolol, Spironolactone, Use eye drops per normal routine  Stop ALL herbal medications at this time  Do NOT smoke the day of surgery.  Do NOT wear jewelry (body piercing), metal hair clips/bobby pins, make-up, artifical eyelashes or nail polish. Do NOT wear lotions, powders, or perfumes.  You may wear deodorant. Do NOT shave for 48 hours prior to surgery. Do NOT bring valuables to the hospital. Contacts, dentures, or bridgework may not be worn into surgery.  Have a responsible adult drive you home and stay with you for 24 hours after your procedure  Bring a copy of your healthcare power of attorney and living will documents.

## 2017-06-20 ENCOUNTER — Other Ambulatory Visit: Payer: Self-pay

## 2017-06-20 ENCOUNTER — Other Ambulatory Visit: Payer: Self-pay | Admitting: Obstetrics and Gynecology

## 2017-06-20 ENCOUNTER — Encounter (HOSPITAL_COMMUNITY)
Admission: RE | Admit: 2017-06-20 | Discharge: 2017-06-20 | Disposition: A | Discharge status: Home / Self Care | Payer: Medicare Other | Source: Ambulatory Visit | Attending: Obstetrics and Gynecology | Admitting: Obstetrics and Gynecology

## 2017-06-20 ENCOUNTER — Encounter (HOSPITAL_COMMUNITY): Payer: Self-pay

## 2017-06-20 DIAGNOSIS — Z01812 Encounter for preprocedural laboratory examination: Secondary | ICD-10-CM | POA: Insufficient documentation

## 2017-06-20 DIAGNOSIS — I10 Essential (primary) hypertension: Secondary | ICD-10-CM | POA: Insufficient documentation

## 2017-06-20 DIAGNOSIS — R42 Dizziness and giddiness: Secondary | ICD-10-CM | POA: Insufficient documentation

## 2017-06-20 DIAGNOSIS — E1165 Type 2 diabetes mellitus with hyperglycemia: Secondary | ICD-10-CM | POA: Insufficient documentation

## 2017-06-20 DIAGNOSIS — E7800 Pure hypercholesterolemia, unspecified: Secondary | ICD-10-CM | POA: Diagnosis not present

## 2017-06-20 DIAGNOSIS — Z01810 Encounter for preprocedural cardiovascular examination: Secondary | ICD-10-CM | POA: Insufficient documentation

## 2017-06-20 HISTORY — DX: Depression, unspecified: F32.A

## 2017-06-20 HISTORY — DX: Tachycardia, unspecified: R00.0

## 2017-06-20 HISTORY — DX: Unspecified cataract: H26.9

## 2017-06-20 HISTORY — DX: Cardiac murmur, unspecified: R01.1

## 2017-06-20 HISTORY — DX: Adhesive capsulitis of unspecified shoulder: M75.00

## 2017-06-20 HISTORY — DX: Headache, unspecified: R51.9

## 2017-06-20 HISTORY — DX: Anemia, unspecified: D64.9

## 2017-06-20 HISTORY — DX: Unspecified macular degeneration: H35.30

## 2017-06-20 HISTORY — DX: Major depressive disorder, single episode, unspecified: F32.9

## 2017-06-20 HISTORY — DX: Unspecified osteoarthritis, unspecified site: M19.90

## 2017-06-20 HISTORY — DX: Cerebral infarction, unspecified: I63.9

## 2017-06-20 HISTORY — DX: Unspecified glaucoma: H40.9

## 2017-06-20 HISTORY — DX: Personal history of urinary calculi: Z87.442

## 2017-06-20 HISTORY — DX: Anxiety disorder, unspecified: F41.9

## 2017-06-20 HISTORY — DX: Polyneuropathy, unspecified: G62.9

## 2017-06-20 HISTORY — DX: Headache: R51

## 2017-06-20 LAB — BASIC METABOLIC PANEL
Anion gap: 10 (ref 5–15)
BUN: 24 mg/dL — AB (ref 6–20)
CHLORIDE: 98 mmol/L — AB (ref 101–111)
CO2: 30 mmol/L (ref 22–32)
CREATININE: 0.76 mg/dL (ref 0.44–1.00)
Calcium: 9.6 mg/dL (ref 8.9–10.3)
GFR calc Af Amer: >60 mL/min (ref >60)
GFR calc non Af Amer: >60 mL/min (ref >60)
GLUCOSE: 124 mg/dL — AB (ref 65–99)
POTASSIUM: 3.6 mmol/L (ref 3.5–5.1)
Sodium: 138 mmol/L (ref 135–145)

## 2017-06-20 LAB — CBC
HEMATOCRIT: 42.6 % (ref 36.0–46.0)
Hemoglobin: 14.1 g/dL (ref 12.0–15.0)
MCH: 28.1 pg (ref 26.0–34.0)
MCHC: 33.1 g/dL (ref 30.0–36.0)
MCV: 85 fL (ref 78.0–100.0)
PLATELETS: 232 10*3/uL (ref 150–400)
RBC: 5.01 MIL/uL (ref 3.87–5.11)
RDW: 14.4 % (ref 11.5–15.5)
WBC: 8.7 10*3/uL (ref 4.0–10.5)

## 2017-06-20 NOTE — Pre-Procedure Instructions (Signed)
Jessica [name] left a message on my voicemail that she will contact Dr. Suzette Battiest for instructions for her insulin pump.

## 2017-06-20 NOTE — Pre-Procedure Instructions (Signed)
Spoke with Ashby Dawes, Diabetes Coordinator , she informed me to have Ms Jessica [name] contact Dr. Suzette Battiest to receive insulin pump management instructions to follow for her surgery.  I called and left a message for Ms. Bodi to inform her that she needed to contact Dr. Christeen Douglas office.

## 2017-06-22 DIAGNOSIS — R296 Repeated falls: Secondary | ICD-10-CM | POA: Diagnosis not present

## 2017-06-22 DIAGNOSIS — G4733 Obstructive sleep apnea (adult) (pediatric): Secondary | ICD-10-CM | POA: Diagnosis not present

## 2017-06-22 DIAGNOSIS — Z1389 Encounter for screening for other disorder: Secondary | ICD-10-CM | POA: Diagnosis not present

## 2017-06-22 DIAGNOSIS — N950 Postmenopausal bleeding: Secondary | ICD-10-CM | POA: Diagnosis not present

## 2017-06-22 DIAGNOSIS — K635 Polyp of colon: Secondary | ICD-10-CM | POA: Diagnosis not present

## 2017-06-22 DIAGNOSIS — E782 Mixed hyperlipidemia: Secondary | ICD-10-CM | POA: Diagnosis not present

## 2017-06-22 DIAGNOSIS — I10 Essential (primary) hypertension: Secondary | ICD-10-CM | POA: Diagnosis not present

## 2017-06-22 DIAGNOSIS — E1140 Type 2 diabetes mellitus with diabetic neuropathy, unspecified: Secondary | ICD-10-CM | POA: Diagnosis not present

## 2017-06-22 DIAGNOSIS — I519 Heart disease, unspecified: Secondary | ICD-10-CM | POA: Diagnosis not present

## 2017-06-22 DIAGNOSIS — H4089 Other specified glaucoma: Secondary | ICD-10-CM | POA: Diagnosis not present

## 2017-06-22 DIAGNOSIS — Z0000 Encounter for general adult medical examination without abnormal findings: Secondary | ICD-10-CM | POA: Diagnosis not present

## 2017-06-23 ENCOUNTER — Ambulatory Visit (INDEPENDENT_AMBULATORY_CARE_PROVIDER_SITE_OTHER): Payer: Medicare Other | Admitting: Cardiology

## 2017-06-23 ENCOUNTER — Encounter: Payer: Self-pay | Admitting: Cardiology

## 2017-06-23 VITALS — BP 138/76 | HR 78 | Ht <= 58 in | Wt 242.0 lb

## 2017-06-23 DIAGNOSIS — M25561 Pain in right knee: Secondary | ICD-10-CM | POA: Diagnosis not present

## 2017-06-23 DIAGNOSIS — E7800 Pure hypercholesterolemia, unspecified: Secondary | ICD-10-CM | POA: Diagnosis not present

## 2017-06-23 DIAGNOSIS — M1712 Unilateral primary osteoarthritis, left knee: Secondary | ICD-10-CM | POA: Diagnosis not present

## 2017-06-23 DIAGNOSIS — G8929 Other chronic pain: Secondary | ICD-10-CM | POA: Diagnosis not present

## 2017-06-23 DIAGNOSIS — Z01810 Encounter for preprocedural cardiovascular examination: Secondary | ICD-10-CM | POA: Diagnosis not present

## 2017-06-23 DIAGNOSIS — T84018A Broken internal joint prosthesis, other site, initial encounter: Secondary | ICD-10-CM | POA: Diagnosis not present

## 2017-06-23 DIAGNOSIS — I10 Essential (primary) hypertension: Secondary | ICD-10-CM | POA: Diagnosis not present

## 2017-06-23 NOTE — Progress Notes (Signed)
[facility] [address], [place]  [address] Phone: [phone] Fax:  [phone]  Date:  06/23/2017   ID:  Jessica [name], Jessica [name] 1946-04-26, MRN [id number]  PCP:  Cari Caraway, MD   History of Present Illness: Jessica [name] is a 71 y.o. female with hx of obesity, hypertension, and hyperlipidemia. 02/23/13 here for follow up.   In 2010, cardiac catheterization showed no evidence of coronary artery disease. Normal ejection fraction  She has been going to outpatient rehabilitation for muscle weakness secondary to repeated falls. She is also seen nutrition for diabetes.  She has a GYN surgery planned by Dr. Renaee Munda. Dysfunctional uterine bleeding. She is not been experiencing any change in symptoms. No excessive shortness of breath, no chest pain except for occasional atypical muscle aches, no syncope, no fevers. She takes iron supplement and vitamin C for anemia. Her hemoglobin was 14.  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  Echocardiogram -EF 65-70%, no WMA, but increase LA pressures.   She uses CPAP nightly. During her sleep study by Dr. Constance Holster, she did have pauses she states.  Has see nutritionist as well  Prior vertigo. Cataract surgery. Tachycardia noted with ambulation.  Her father was featured in the life section of the news and record on 04/02/2016. 69 years old.  Wt Readings from Last 3 Encounters:  06/23/17 242 lb (109.8 kg)  06/20/17 241 lb 4 oz (109.4 kg)  03/10/17 241 lb (109.3 kg)     Past Medical History:  Diagnosis Date  . Anemia   . Anxiety   . Arthritis   . Cataract   . Depression   . Diabetes mellitus without complication ([place])   . Diastolic dysfunction   . Frozen shoulder   . Glaucoma   . Headache    History of migraines  . Heart murmur   . History of kidney stones   . Hyperlipidemia   . Hypertension   . Macular degeneration   . Neuropathy   . Obesity   . Sleep apnea   . Stroke [facility])    has been told by  a Dr. Mort Sawyers that she may have had a mini stroke but no further testing was done to confirm  . Tachycardia     Past Surgical History:  Procedure Laterality Date  . CARDIAC CATHETERIZATION     -6/09-normal ejection fraction 65%, no angiographically significant CAD., M. Erubiel Manasco MD  . CESAREAN SECTION    . COLONOSCOPY    . foot spurs removed    . JOINT REPLACEMENT    . knee spurs removed    . shoulder spurs removed    . TONSILLECTOMY AND ADENOIDECTOMY    . UPPER GI ENDOSCOPY      Current Outpatient Prescriptions  Medication Sig Dispense Refill  . amoxicillin (AMOXIL) 500 MG capsule TAKE 4 TABLETS BY MOUTH 1 HOUR BEFORE APPOINTMENT  12  . aspirin 81 MG tablet Take 81 mg by mouth daily.    . B-D INS SYRINGE 0.5CC/31GX5/16 31G X 5/16" 0.5 ML MISC Use as directed per provider.    . Calcium Carbonate-Vitamin D 600-200 MG-UNIT TABS Take 1 tablet by mouth daily.     . clobetasol cream (TEMOVATE) 0.05 % APPLY TO AFFECTED AREAS 2 TIMES DAILY NOT TO FACE,GROIN, OR ARMPIT  3  . Coenzyme Q10 (CO Q-10) 100 MG CAPS Take 100 mg by mouth at bedtime.     . dorzolamide-timolol (COSOPT)  22.3-6.8 MG/ML ophthalmic solution Place 1 drop into both eyes 2 (two) times daily.   4  . fluticasone (FLONASE) 50 MCG/ACT nasal spray Place 1 spray into both nostrils daily as needed for allergies or rhinitis.    . furosemide (LASIX) 40 MG tablet Take 1 tablet (40 mg total) by mouth daily. 90 tablet 0  . Garlic 2458 MG CAPS Take 1,000 mg by mouth daily.     . insulin lispro (HUMALOG) 100 UNIT/ML injection Inject into the skin daily. Insulin pump    . INVOKANA 300 MG TABS tablet Take 300 mg by mouth daily.    . Iron-Vitamin C (VITRON-C) 65-125 MG TABS Take 1 tablet by mouth at bedtime.     Marland Kitchen losartan (COZAAR) 100 MG tablet Take 1 tablet (100 mg total) by mouth daily. (Patient taking differently: Take 100 mg by mouth at bedtime. ) 90 tablet 0  . metFORMIN (GLUCOPHAGE-XR) 500 MG 24 hr tablet Take 500 mg by mouth daily  with supper.    . metoprolol tartrate (LOPRESSOR) 100 MG tablet Take 1 tablet (100 mg total) by mouth 2 (two) times daily. 180 tablet 0  . Multiple Vitamins-Minerals (CENTRUM SILVER PO) Take 1 tablet by mouth daily.     . Multiple Vitamins-Minerals (PRESERVISION AREDS 2 PO) Take 1 tablet by mouth 2 (two) times daily.    . ONE TOUCH ULTRA TEST test strip 1 each by Other route as needed (Use as directed per provider).   11  . Probiotic Product (PROBIOTIC DAILY PO) Take 1 capsule by mouth 2 (two) times daily.    . simvastatin (ZOCOR) 40 MG tablet Take 40 mg by mouth every evening.    Marland Kitchen spironolactone (ALDACTONE) 25 MG tablet Take 1 tablet (25 mg total) by mouth daily. 90 tablet 1  . traMADol (ULTRAM) 50 MG tablet Take 50 mg by mouth at bedtime as needed for moderate pain.    . Travoprost, BAK Free, (TRAVATAN) 0.004 % SOLN ophthalmic solution Place 1 drop into both eyes at bedtime.     . Vitamin D, Ergocalciferol, 2000 units CAPS Take 2,000 Units by mouth daily.     No current facility-administered medications for this visit.     Allergies:    Allergies  Allergen Reactions  . Meloxicam     Having bleeding  . Percocet [Oxycodone-Acetaminophen] Other (See Comments)    Headache  . Vicodin [Hydrocodone-Acetaminophen] Other (See Comments)    Headache   . Azithromycin Rash  . Iodine Rash    Social History:  The patient  reports that she has never smoked. She has never used smokeless tobacco. She reports that she does not drink alcohol or use drugs.   ROS:  Please see the history of present illness.   Denies any fevers, chills, orthopnea, PND.   PHYSICAL EXAM: VS:  BP 138/76   Pulse 78   Ht 4\' 10"  (1.473 m)   Wt 242 lb (109.8 kg)   LMP  (LMP Unknown)   BMI 50.58 kg/m  Well nourished, well developed, in no acute distress  HEENT: normal  Neck: no JVD  Cardiac:  normal S1, S2; RRR; no murmur  Lungs:  clear to auscultation bilaterally, no wheezing, rhonchi or rales  Abd: soft,  nontender, no hepatomegaly Obese Ext: ankle edema and chronic leg edema.  Skin: warm and dry  Neuro: no focal abnormalities noted  EKG-06/20/17-sinus rhythm, left axis deviation, no other abnormalities personally viewed 11/26/15-sinus rhythm, 77, no other abnormalities personally viewed-prior 03/06/14-normal rhythm,  79, poor R wave progression, no other abnormalities.     LDL 41, hemoglobin 14, creatinine 0.7  ASSESSMENT AND PLAN:   1. Morbid obesity-continue to encourage weight loss, she has seen nutrition.  2. Preoperative risk stratification-she is of moderate risk from a cardiovascular perspective mainly based upon obesity and some challenges this may pose with anesthesia. She may proceed with surgery without any further testing. EKG reviewed. She is going to have GYN surgery.  3. Hypertension-improved control. Multidrug regimen reviewed. Continue to monitor blood pressures at home. No changes 4. Prior hyperkalemia- On Aldactone. She seems to be tolerating this very well. Last potassium was 3.6. 5. Atypical left sided chest pain-chronic. Likely musculoskeletal/flank pain prior cardiac catheterization was reassuring. 6. Diabetes-per primary team. Labs at Dr. Chalmers Cater. Pump 7. Hyperlipidemia-continue with simvastatin especially with diabetes. Excellent.  8. We will see back in 12 months.  Signed, Candee Furbish, MD [facility]  06/23/2017 10:11 AM

## 2017-06-23 NOTE — Patient Instructions (Signed)
Medication Instructions:  The current medical regimen is effective;  continue present plan and medications.  Labwork: None   Testing/Procedures: None  Follow-Up: Follow up in 1 year with Dr. Skains.  You will receive a letter in the mail 2 months before you are due.  Please call us when you receive this letter to schedule your follow up appointment.  If you need a refill on your cardiac medications before your next appointment, please call your pharmacy.  Thank you for choosing [facility]!!     

## 2017-06-27 ENCOUNTER — Other Ambulatory Visit: Payer: Self-pay | Admitting: Obstetrics and Gynecology

## 2017-06-27 NOTE — H&P (Signed)
Chief Complaint(s):   Postmenopausal bleeding   HPI:  General 71 y/o presents for hysteroscopy D&C possible polypectomy to evaluate postmenopausal bleeding. Her last mesnses was in 1999. she started having postmenopausal bleeding. May 3rd for 2-3 day. ( very Light) with spotting afterward daily. July 5th was heavy and had pelvic pain. she took tramadol due to the pain. with relief.  Her ultrasound 06/13/2017 reveals a 8.3 cm x 4.1 cm x 4.9 cm uterus. the endometrium is 1.0 cm no discreet mass is seen. no blood flow is noted. She has a 1.6 cm posterior fibroid. and an 0.9 cm anterior fibroid. Marland Kitchen the right ovary appears normal the left ovary was not visualized. No adnexal masses were seen. .  She failed endometrial biopsy in the office.   she is diabetic. Her endocrinologist is Dr. Chalmers Cater . Per patient her last hgb A1c was 7.2. she has an insulin pump.  Current Medication:  Taking  Furosemide 40 MG Tablet 1 tablet Orally Once a day     Simvastatin 40 MG Tablet 1 tablet in the evening Orally Once a day     Amoxicillin 500 MG Tablet 1 tablet Orally as needed for dental procedure     Aspirin 81 MG Tablet Chewable 1 tablet Orally Once a day     Metformin HCl 500 MG Tablet 1 tablet with meals Orally Once a day, Notes: BALAN     Invokana(Canagliflozin) 300 MG Tablet 1 tablet Orally Once a day, Notes: BALAN     Humalog(Insulin Lispro) 100 UNIT/ML Solution 135units Subcutaneous via insulin pump daily, Notes: BALAN     Metoprolol Tartrate 100 MG Tablet 1 tablet Orally Twice a day, Notes: SKAINS     Losartan Potassium 100 mg Tablet 1 tablet Orally Once a day, Notes: SKAINS     Spironolactone 25 mg Tablet 1 tablet Orally Once a day, Notes: SKAINS     Garlic 5102 MG Capsule 1 tablet Orally once a day in the am     Vitamin D 2000 units Tablet 1 tablet Orally Once a day     Dorzolamide HCl-Timolol Mal 22.3-6.8 MG/ML Solution 1 drop into affected eye Ophthalmic Twice a day, Notes: HECKER     Travatan(Travoprost) 0.004 % Solution Dr Herbert Deaner Ophthalmic Once a day, Notes: HECKER     CPAP each night, Notes: ROSEN     Vitron-C(Iron/C) 65-125 MG Tablet Orally DAILY     CoQ-10 100 MG Capsule Orally once a day     Calcium + D3 600-200 MG-UNIT Tablet 1 tablet with a meal Orally Once a day     Probiotic - Accuflora Tablet Delayed Release 1 capsule Orally Twice a day   Discontinued  Flonase Allergy Relief(Fluticasone Propionate) 50 MCG/ACT Suspension 1 spray in each nostril Nasally Once a day, PRN     Benadryl Allergy(DiphenhydrAMINE HCl) 25 MG Tablet 1 tablet as needed Orally every 8 hrs     Tramadol HCl 50 MG Tablet 1 tablet as needed for nighttime pain Orally once a day     Medication List reviewed and reconciled with the patient   Medical History:   Obesity     Type II Diabetes - Dr. Cyd Silence, Dr. Johney Frame     Hyperlipidemia     Hypertension     Osteoarthritis     Anemia, iron deficient     cardiac catheterization-6/09-normal ejection fraction 65%, no angiographically significant CAD., M. Skains MD     Medicare Disability     Personal history of  colon polyps     sleep apnea     vertigo     Macular Degeneration, Glaucoma - Dr. Monna Fam     Right foot: numbness/tingling since knee surgery     PT for right knee      Allergies/Intolerance:   Percocet - headaches     Tramadol HCl - cant move     Iodine - hives     Codeine (for allergy) - hives     Azithromycin - hives     Meloxicam - SOB   Gyn History:   Sexual activity currently sexually active. Periods : postmenopausal. Last pap smear date early 2000's - WNL. Last mammogram date 01/18/17 - 3D. Denies Abnormal pap smear. Denies STD. Menarche 12/13.   OB History:   Number of pregnancies 2. Pregnancy # 1 delivery at 5 months - (baby took one breath & passed away) procedure afterward. Pregnancy # 2 live birth, C-section, , girl.   Surgical History:   Right shoulder arthroscopic surgery - Dr.  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 Rozann Lesches (dermatologist) 06/2010     tonsillectomy     Colonoscopy 09/01/2005 03/2013     (L) great toenail removed 01/2016     abcess lower back removed from using cane   Hospitalization:   Right Shoulder Surgery 11/06/2008     surgeries   Family History:   Father: alive, colon polyps, diagnosed with COPD (chronic obstructive pulmonary disease)    Mother: deceased, died age 92 secondary to brain cancer    Paternal Grand Mother: deceased, Pancereatic cancer    Brother 17: alive, depression    Sister 1: alive, bipolar, diagnosed with Hypertension   No early CAD.  NEG GI FAMILY HX of liver disease.  Social History:  General Tobacco use cigarettes: Never smoked, Tobacco history last updated 06/13/2017.  no EXPOSURE TO PASSIVE SMOKE.  Alcohol: yes, occasionally, wine.  no Recreational drug use.  DIET: no particular dietary program.  no Exercise, NONE.  Marital Status: single, Divorced.  Children: one.  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  ROS: Negative except as stated in HPI.   Objective: Vitals:  Wt 245, Wt change 2.2 lb, Ht 58.75, BMI 49.90, Pulse sitting 70, BP sitting 124/76  Past Results:  Examination:  General Examination alert, oriented, NAD " label="GENERAL APPEARANCE" categoryPropId="10089" examid="[id number]"GENERAL APPEARANCE alert, oriented, NAD .  moist, warm " label="SKIN:" categoryPropId="10109" examid="[id number]"SKIN: moist, warm .  Conjunctiva clear " label="EYES:" categoryPropId="21468" examid="[id number]"EYES: Conjunctiva clear .  clear to auscultation bilaterally" label="LUNGS:" categoryPropId="87" examid="[id number]"LUNGS: clear to auscultation bilaterally.  regular rate and rhythm" label="HEART:"  categoryPropId="86" examid="[id number]"HEART: regular rate and rhythm.  soft, non-tender/non-distended, bowel sounds present " label="ABDOMEN:" categoryPropId="88" examid="[id number]"ABDOMEN: soft, non-tender/non-distended, bowel sounds present .  normal external genitalia, labia - unremarkable, vagina - pink moist mucosa, no lesions or abnormal discharge, cervix - difficult to fully visualize due to body habitus... polyp seen ... Procedure.. Verbal informed consent obtained. betadine applied the polyp was grasped with ring forceps and removed partially... Procedure ... informed consent for endometrial biopsy.. cervix cleanes with betadine.. pt did not tolerate attemp to grasp cevix with tenaculum... procedure discontinued due to patient discomfort and lack of visualization. no discharge or lesions or CMT, adnexa - no masses or tenderness, uterus -  nontender and normal size on palpation " label="FEMALE GENITOURINARY:" categoryPropId="13414" examid="[id number]"FEMALE GENITOURINARY: normal external genitalia, labia - unremarkable, vagina - pink moist mucosa, no lesions or abnormal discharge, cervix - difficult to fully visualize due to body habitus... polyp seen ... Procedure.. Verbal informed consent obtained. betadine applied the polyp was grasped with ring forceps and removed partially... Procedure ... informed consent for endometrial biopsy.. cervix cleanes with betadine.. pt did not tolerate attemp to grasp cevix with tenaculum... procedure discontinued due to patient discomfort and lack of visualization. no discharge or lesions or CMT, adnexa - no masses or tenderness, uterus - nontender and normal size on palpation .  affect normal, good eye contact " label="PSYCH:" categoryPropId="16316" examid="[id number]"PSYCH: affect normal, good eye contact .  Physical Examination:   Assessment: Assessment:  Postmenopausal bleeding - N95.0 (Primary)     Cervical polyp - N84.1     Plan: Treatment:  Postmenopausal bleeding    Imaging:US ECHO TRANSVAGINAL  Notes: failed endometrial biopsy in the office. pt needs hysteroscopy D&C  . given failed endometrial biopsy plan hysteroscopy D&C outpatient wit possible myosure.. r/b/a of surgery were discussed with the patient including but not limited to infection. bleeding possible perforation of the uterus with the need for further surgery. pt voiced understanding and desires to proceed. to ruleout endometrial hyperplasia or malignancy.Marland Kitchen

## 2017-06-28 ENCOUNTER — Ambulatory Visit (HOSPITAL_COMMUNITY): Payer: Medicare Other | Admitting: Certified Registered Nurse Anesthetist

## 2017-06-28 ENCOUNTER — Encounter (HOSPITAL_COMMUNITY)
Admission: RE | Disposition: A | Discharge status: Home / Self Care | Payer: Self-pay | Source: Ambulatory Visit | Attending: Obstetrics and Gynecology

## 2017-06-28 ENCOUNTER — Encounter (HOSPITAL_COMMUNITY): Payer: Self-pay

## 2017-06-28 ENCOUNTER — Ambulatory Visit (HOSPITAL_COMMUNITY)
Admission: RE | Admit: 2017-06-28 | Discharge: 2017-06-28 | Disposition: A | Discharge status: Home / Self Care | Payer: Medicare Other | Source: Ambulatory Visit | Attending: Obstetrics and Gynecology | Admitting: Obstetrics and Gynecology

## 2017-06-28 DIAGNOSIS — Z7982 Long term (current) use of aspirin: Secondary | ICD-10-CM | POA: Insufficient documentation

## 2017-06-28 DIAGNOSIS — N858 Other specified noninflammatory disorders of uterus: Secondary | ICD-10-CM | POA: Diagnosis not present

## 2017-06-28 DIAGNOSIS — E785 Hyperlipidemia, unspecified: Secondary | ICD-10-CM | POA: Diagnosis not present

## 2017-06-28 DIAGNOSIS — Z955 Presence of coronary angioplasty implant and graft: Secondary | ICD-10-CM | POA: Insufficient documentation

## 2017-06-28 DIAGNOSIS — N950 Postmenopausal bleeding: Secondary | ICD-10-CM | POA: Diagnosis not present

## 2017-06-28 DIAGNOSIS — G4730 Sleep apnea, unspecified: Secondary | ICD-10-CM | POA: Insufficient documentation

## 2017-06-28 DIAGNOSIS — Z79899 Other long term (current) drug therapy: Secondary | ICD-10-CM | POA: Insufficient documentation

## 2017-06-28 DIAGNOSIS — D509 Iron deficiency anemia, unspecified: Secondary | ICD-10-CM | POA: Insufficient documentation

## 2017-06-28 DIAGNOSIS — E119 Type 2 diabetes mellitus without complications: Secondary | ICD-10-CM | POA: Insufficient documentation

## 2017-06-28 DIAGNOSIS — N840 Polyp of corpus uteri: Secondary | ICD-10-CM | POA: Insufficient documentation

## 2017-06-28 DIAGNOSIS — Z794 Long term (current) use of insulin: Secondary | ICD-10-CM | POA: Diagnosis not present

## 2017-06-28 DIAGNOSIS — E139 Other specified diabetes mellitus without complications: Secondary | ICD-10-CM

## 2017-06-28 DIAGNOSIS — I10 Essential (primary) hypertension: Secondary | ICD-10-CM | POA: Insufficient documentation

## 2017-06-28 DIAGNOSIS — I2510 Atherosclerotic heart disease of native coronary artery without angina pectoris: Secondary | ICD-10-CM | POA: Diagnosis not present

## 2017-06-28 HISTORY — PX: HYSTEROSCOPY WITH D & C: SHX1775

## 2017-06-28 LAB — GLUCOSE, CAPILLARY
GLUCOSE-CAPILLARY: 164 mg/dL — AB (ref 65–99)
GLUCOSE-CAPILLARY: 143 mg/dL — AB (ref 65–99)
Glucose-Capillary: 110 mg/dL — ABNORMAL HIGH (ref 65–99)

## 2017-06-28 SURGERY — DILATATION AND CURETTAGE /HYSTEROSCOPY
Anesthesia: General | Site: Vagina

## 2017-06-28 MED ORDER — BUPIVACAINE HCL (PF) 0.25 % IJ SOLN
INTRAMUSCULAR | Status: DC | PRN
  Administered 2017-06-28: 20 mL

## 2017-06-28 MED ORDER — MIDAZOLAM HCL 2 MG/2ML IJ SOLN
INTRAMUSCULAR | Status: DC | PRN
  Administered 2017-06-28: 2 mg via INTRAVENOUS

## 2017-06-28 MED ORDER — BUPIVACAINE HCL (PF) 0.25 % IJ SOLN
INTRAMUSCULAR | Status: AC
  Filled 2017-06-28: qty 30

## 2017-06-28 MED ORDER — IBUPROFEN 600 MG PO TABS: ORAL_TABLET | ORAL | 0 refills | Status: DC

## 2017-06-28 MED ORDER — LIDOCAINE HCL (CARDIAC) 20 MG/ML IV SOLN
INTRAVENOUS | Status: DC | PRN
  Administered 2017-06-28: 60 mg via INTRAVENOUS

## 2017-06-28 MED ORDER — FENTANYL CITRATE (PF) 100 MCG/2ML IJ SOLN
INTRAMUSCULAR | Status: AC
  Filled 2017-06-28: qty 2

## 2017-06-28 MED ORDER — MIDAZOLAM HCL 2 MG/2ML IJ SOLN
INTRAMUSCULAR | Status: AC
  Filled 2017-06-28: qty 2

## 2017-06-28 MED ORDER — PHENYLEPHRINE HCL 10 MG/ML IJ SOLN
INTRAMUSCULAR | Status: DC | PRN
  Administered 2017-06-28 (×2): 80 ug via INTRAVENOUS

## 2017-06-28 MED ORDER — LACTATED RINGERS IV SOLN
INTRAVENOUS | Status: DC
  Administered 2017-06-28: 125 mL/h via INTRAVENOUS

## 2017-06-28 MED ORDER — LIDOCAINE HCL (CARDIAC) 20 MG/ML IV SOLN
INTRAVENOUS | Status: AC
  Filled 2017-06-28: qty 5

## 2017-06-28 MED ORDER — DOXYCYCLINE HYCLATE 50 MG PO CAPS: ORAL_CAPSULE | ORAL | 0 refills | Status: DC

## 2017-06-28 MED ORDER — MEPERIDINE HCL 25 MG/ML IJ SOLN: 6.2500 mg | INTRAMUSCULAR | Status: DC | PRN

## 2017-06-28 MED ORDER — METOCLOPRAMIDE HCL 5 MG/ML IJ SOLN: 10.0000 mg | Freq: Once | INTRAMUSCULAR | Status: DC | PRN

## 2017-06-28 MED ORDER — PROPOFOL 10 MG/ML IV BOLUS
INTRAVENOUS | Status: AC
  Filled 2017-06-28: qty 20

## 2017-06-28 MED ORDER — ONDANSETRON HCL 4 MG/2ML IJ SOLN
INTRAMUSCULAR | Status: DC | PRN
  Administered 2017-06-28: 4 mg via INTRAVENOUS

## 2017-06-28 MED ORDER — PHENYLEPHRINE 40 MCG/ML (10ML) SYRINGE FOR IV PUSH (FOR BLOOD PRESSURE SUPPORT)
PREFILLED_SYRINGE | INTRAVENOUS | Status: AC
  Filled 2017-06-28: qty 10

## 2017-06-28 MED ORDER — FENTANYL CITRATE (PF) 100 MCG/2ML IJ SOLN
INTRAMUSCULAR | Status: AC
  Administered 2017-06-28: 50 ug via INTRAVENOUS
  Filled 2017-06-28: qty 2

## 2017-06-28 MED ORDER — ONDANSETRON HCL 4 MG/2ML IJ SOLN
INTRAMUSCULAR | Status: AC
  Filled 2017-06-28: qty 2

## 2017-06-28 MED ORDER — SILVER NITRATE-POT NITRATE 75-25 % EX MISC
CUTANEOUS | Status: AC
  Filled 2017-06-28: qty 4

## 2017-06-28 MED ORDER — FENTANYL CITRATE (PF) 100 MCG/2ML IJ SOLN
25.0000 ug | INTRAMUSCULAR | Status: DC | PRN
  Administered 2017-06-28 (×2): 50 ug via INTRAVENOUS

## 2017-06-28 MED ORDER — PROPOFOL 10 MG/ML IV BOLUS
INTRAVENOUS | Status: DC | PRN
  Administered 2017-06-28: 300 mg via INTRAVENOUS

## 2017-06-28 MED ORDER — SODIUM CHLORIDE 0.9 % IR SOLN
Status: DC | PRN
  Administered 2017-06-28: 3000 mL

## 2017-06-28 SURGICAL SUPPLY — 20 items
BIPOLAR CUTTING LOOP 21FR (ELECTRODE)
CANISTER SUCT 3000ML PPV (MISCELLANEOUS) ×3 IMPLANT
CATH ROBINSON RED A/P 16FR (CATHETERS) ×3 IMPLANT
CLOTH BEACON ORANGE TIMEOUT ST (SAFETY) ×3 IMPLANT
CONTAINER PREFILL 10% NBF 60ML (FORM) ×6 IMPLANT
DILATOR CANAL MILEX (MISCELLANEOUS) ×2 IMPLANT
ELECT REM PT RETURN 9FT ADLT (ELECTROSURGICAL) ×3
ELECTRODE REM PT RTRN 9FT ADLT (ELECTROSURGICAL) ×1 IMPLANT
GLOVE BIOGEL M 6.5 STRL (GLOVE) ×6 IMPLANT
GLOVE BIOGEL PI IND STRL 6.5 (GLOVE) ×1 IMPLANT
GLOVE BIOGEL PI IND STRL 7.0 (GLOVE) ×1 IMPLANT
GLOVE BIOGEL PI INDICATOR 6.5 (GLOVE) ×2
GLOVE BIOGEL PI INDICATOR 7.0 (GLOVE) ×2
GOWN STRL REUS W/TWL LRG LVL3 (GOWN DISPOSABLE) ×6 IMPLANT
LOOP CUTTING BIPOLAR 21FR (ELECTRODE) IMPLANT
PACK VAGINAL MINOR WOMEN LF (CUSTOM PROCEDURE TRAY) ×3 IMPLANT
PAD OB MATERNITY 4.3X12.25 (PERSONAL CARE ITEMS) ×3 IMPLANT
TOWEL OR 17X24 6PK STRL BLUE (TOWEL DISPOSABLE) ×6 IMPLANT
TUBING AQUILEX INFLOW (TUBING) ×3 IMPLANT
TUBING AQUILEX OUTFLOW (TUBING) ×3 IMPLANT

## 2017-06-28 NOTE — Discharge Instructions (Signed)
DISCHARGE INSTRUCTIONS: HYSTEROSCOPY / ENDOMETRIAL ABLATION °The following instructions have been prepared to help you care for yourself upon your return home. ° °May Remove Scop patch on or before ° °May take Ibuprofen after ° °May take stool softner while taking narcotic pain medication to prevent constipation.  Drink plenty of water. ° °Personal hygiene: °• Use sanitary pads for vaginal drainage, not tampons. °• Shower the day after your procedure. °• NO tub baths, pools or Jacuzzis for 2-3 weeks. °• Wipe front to back after using the bathroom. ° °Activity and limitations: °• Do NOT drive or operate any equipment for 24 hours. The effects of anesthesia are still present °and drowsiness may result. °• Do NOT rest in bed all day. °• Walking is encouraged. °• Walk up and down stairs slowly. °• You may resume your normal activity in one to two days or as indicated by your physician. °Sexual activity: NO intercourse for at least 2 weeks after the procedure, or as indicated by your °Doctor. ° °Diet: Eat a light meal as desired this evening. You may resume your usual diet tomorrow. ° °Return to Work: You may resume your work activities in one to two days or as indicated by your °Doctor. ° °What to expect after your surgery: Expect to have vaginal bleeding/discharge for 2-3 days and °spotting for up to 10 days. It is not unusual to have soreness for up to 1-2 weeks. You may have a °slight burning sensation when you urinate for the first day. Mild cramps may continue for a couple of °days. You may have a regular period in 2-6 weeks. ° °Call your doctor for any of the following: °• Excessive vaginal bleeding or clotting, saturating and changing one pad every hour. °• Inability to urinate 6 hours after discharge from hospital. °• Pain not relieved by pain medication. °• Fever of 100.4° F or greater. °• Unusual vaginal discharge or odor. ° °Return to office _________________Call for an appointment  ___________________ °Patient’s signature: ______________________ °Nurse’s signature ________________________ ° °Post Anesthesia Care Unit [phone] °Post Anesthesia Home Care Instructions ° °Activity: °Get plenty of rest for the remainder of the day. A responsible individual must stay with you for 24 hours following the procedure.  °For the next 24 hours, DO NOT: °-Drive a car °-Operate machinery °-Drink alcoholic beverages °-Take any medication unless instructed by your physician °-Make any legal decisions or sign important papers. ° °Meals: °Start with liquid foods such as gelatin or soup. Progress to regular foods as tolerated. Avoid greasy, spicy, heavy foods. If nausea and/or vomiting occur, drink only clear liquids until the nausea and/or vomiting subsides. Call your physician if vomiting continues. ° °Special Instructions/Symptoms: °Your throat may feel dry or sore from the anesthesia or the breathing tube placed in your throat during surgery. If this causes discomfort, gargle with warm salt water. The discomfort should disappear within 24 hours. ° °If you had a scopolamine patch placed behind your ear for the management of post- operative nausea and/or vomiting: ° °1. The medication in the patch is effective for 72 hours, after which it should be removed.  Wrap patch in a tissue and discard in the trash. Wash hands thoroughly with soap and water. °2. You may remove the patch earlier than 72 hours if you experience unpleasant side effects which may include dry mouth, dizziness or visual disturbances. °3. Avoid touching the patch. Wash your hands with soap and water after contact with the patch. °  ° °

## 2017-06-28 NOTE — H&P (Signed)
Date of Initial H&P: 06/27/2017 History reviewed, patient examined, no change in status, stable for surgery.

## 2017-06-28 NOTE — Op Note (Signed)
06/28/2017  1:49 PM  PATIENT:  Jessica [name]  71 y.o. female  PRE-OPERATIVE DIAGNOSIS:  POST MENOPAUSAL BLEEDING  POST-OPERATIVE DIAGNOSIS:  post menopausal bleeding  PROCEDURE:  Procedure(s): DILATATION AND CURETTAGE /HYSTEROSCOPY/POLYPECTOMY WITH MYOSURE (N/A)  SURGEON:  Surgeon(s) and Role:    Christophe Louis, MD - Primary  PHYSICIAN ASSISTANT:   ASSISTANTS: Dr. Janyth Pupa assisted to help with visualization due to morbid obesity    ANESTHESIA:   general  EBL:  Total I/O In: 700 [I.V.:700] Out: 200 [Urine:200]  BLOOD ADMINISTERED:none  DRAINS: none   LOCAL MEDICATIONS USED:  MARCAINE     SPECIMEN:  Source of Specimen:  endometrial polyp and endometrial currettings   DISPOSITION OF SPECIMEN:  PATHOLOGY  COUNTS:  YES  TOURNIQUET:  * No tourniquets in log *  DICTATION: .Dragon Dictation  PLAN OF CARE: Discharge to home after PACU  PATIENT DISPOSITION:  PACU - hemodynamically stable.   Delay start of Pharmacological VTE agent (>24hrs) due to surgical blood loss or risk of bleeding: not applicable  Findings: Fundal endometrial polyp.. Atrophic appearing endometrium. Normal cervix.   Procedure: Patient was taken to the operating room where she was placed under general anesthesia. She was placed in the dorsal lithotomy position. She was prepped and draped in the usual sterile fashion. A time out was performed and pt was identified as Jessica [name].  A speculum was placed into the vaginal vault. The anterior lip of the cervix was grasped with a single-tooth tenaculum. Quarter percent Marcaine was injected at the 4 and 8:00 positions of the cervix. The cervix was then sounded to 8 cm. The cervix was dilated to approximately 6 mm. Myosure  operative  hysteroscope was inserted. The findings noted above. Myosure small polyp blade was introduced throught the hysteroscope. . The endometrial polyp was removed without difficulty. There was no evidence of perforation.  Hysteroscope was then removed. Sharp curete was introduced and curettage was performed. The single-tooth tenaculum was removed from the anterior lip of the cervix.  The speculum was removed from the patient's vagina. She was awakened from anesthesia taken care  To the recovery  room awake and in stable condition. Sponge lap and needle counts were correct x2.

## 2017-06-28 NOTE — Anesthesia Procedure Notes (Signed)
Procedure Name: LMA Insertion Date/Time: 06/28/2017 1:13 PM Performed by: Hewitt Blade Pre-anesthesia Checklist: Patient identified, Emergency Drugs available, Suction available and Patient being monitored Patient Re-evaluated:Patient Re-evaluated prior to induction Oxygen Delivery Method: Circle system utilized Preoxygenation: Pre-oxygenation with 100% oxygen Induction Type: IV induction LMA Size: 4.0 Number of attempts: 1 Placement Confirmation: positive ETCO2 and breath sounds checked- equal and bilateral Tube secured with: Tape Dental Injury: Teeth and Oropharynx as per pre-operative assessment

## 2017-06-28 NOTE — Progress Notes (Signed)
1102AM, CBG 164, patient adjusted the insulin pump 1205PM CBG 143, patient adjusted  insulin

## 2017-06-28 NOTE — Anesthesia Preprocedure Evaluation (Signed)
Anesthesia Evaluation  Patient identified by MRN, date of birth, ID band Patient awake    Reviewed: Allergy & Precautions, NPO status , Patient's Chart, lab work & pertinent test results  Airway Mallampati: III  TM Distance: >3 FB Neck ROM: Full    Dental no notable dental hx. (+) Teeth Intact   Pulmonary sleep apnea and Continuous Positive Airway Pressure Ventilation ,    Pulmonary exam normal breath sounds clear to auscultation       Cardiovascular hypertension, Pt. on medications and Pt. on home beta blockers Normal cardiovascular exam Rhythm:Regular Rate:Normal     Neuro/Psych  Headaches, PSYCHIATRIC DISORDERS Anxiety Depression CVA    GI/Hepatic negative GI ROS, Neg liver ROS,   Endo/Other  diabetes, Well Controlled, Type 2, Insulin Dependent, Oral Hypoglycemic AgentsMorbid obesityHypercholesterolemia  Renal/GU negative Renal ROS  negative genitourinary   Musculoskeletal  (+) Arthritis , Osteoarthritis,    Abdominal (+) + obese,   Peds  Hematology  (+) anemia ,   Anesthesia Other Findings   Reproductive/Obstetrics Endometrial polyp PMB                             Anesthesia Physical Anesthesia Plan  ASA: III  Anesthesia Plan: General   Post-op Pain Management:  Regional for Post-op pain   Induction:   PONV Risk Score and Plan: 4 or greater and Ondansetron, Dexamethasone, Midazolam, Propofol infusion and Metaclopromide  Airway Management Planned: LMA  Additional Equipment:   Intra-op Plan:   Post-operative Plan: Extubation in OR  Informed Consent: I have reviewed the patients History and Physical, chart, labs and discussed the procedure including the risks, benefits and alternatives for the proposed anesthesia with the patient or authorized representative who has indicated his/her understanding and acceptance.   Dental advisory given  Plan Discussed with: CRNA,  Anesthesiologist and Surgeon  Anesthesia Plan Comments:         Anesthesia Quick Evaluation

## 2017-06-28 NOTE — Transfer of Care (Signed)
Immediate Anesthesia Transfer of Care Note  Patient: Jessica [name]  Procedure(s) Performed: Procedure(s): DILATATION AND CURETTAGE /HYSTEROSCOPY/POLYPECTOMY WITH MYOSURE (N/A)  Patient Location: PACU  Anesthesia Type:General  Level of Consciousness: awake, alert  and oriented  Airway & Oxygen Therapy: Patient Spontanous Breathing and Patient connected to nasal cannula oxygen  Post-op Assessment: Report given to RN, Post -op Vital signs reviewed and stable and Patient moving all extremities  Post vital signs: Reviewed and stable  Last Vitals:  Vitals:   06/28/17 1123  BP: (!) 134/55  Pulse: 77  Resp: 20  Temp: 36.9 C  SpO2: 97%    Last Pain:  Vitals:   06/28/17 1123  TempSrc: Oral  PainSc: 5       Patients Stated Pain Goal: 3 (28/78/67 6720)  Complications: No apparent anesthesia complications

## 2017-06-28 NOTE — Anesthesia Postprocedure Evaluation (Signed)
Anesthesia Post Note  Patient: Jessica [name]  Procedure(s) Performed: Procedure(s) (LRB): DILATATION AND CURETTAGE /HYSTEROSCOPY/POLYPECTOMY WITH MYOSURE (N/A)     Patient location during evaluation: PACU Anesthesia Type: General Level of consciousness: awake and alert Pain management: pain level controlled Vital Signs Assessment: post-procedure vital signs reviewed and stable Respiratory status: spontaneous breathing, nonlabored ventilation and respiratory function stable Cardiovascular status: blood pressure returned to baseline and stable Postop Assessment: no signs of nausea or vomiting Anesthetic complications: no    Last Vitals:  Vitals:   06/28/17 1400 06/28/17 1415  BP: (!) 165/66 (!) 166/61  Pulse: 79 81  Resp: 20 18  Temp:    SpO2: 98% 98%    Last Pain:  Vitals:   06/28/17 1415  TempSrc:   PainSc: 5    Pain Goal: Patients Stated Pain Goal: 3 (06/28/17 1123)               Angas Isabell A.

## 2017-06-29 ENCOUNTER — Encounter (HOSPITAL_COMMUNITY): Payer: Self-pay | Admitting: Obstetrics and Gynecology

## 2017-07-01 ENCOUNTER — Other Ambulatory Visit: Payer: Self-pay | Admitting: Cardiology

## 2017-07-14 DIAGNOSIS — L602 Onychogryphosis: Secondary | ICD-10-CM | POA: Diagnosis not present

## 2017-07-14 DIAGNOSIS — E1351 Other specified diabetes mellitus with diabetic peripheral angiopathy without gangrene: Secondary | ICD-10-CM | POA: Diagnosis not present

## 2017-07-21 DIAGNOSIS — R921 Mammographic calcification found on diagnostic imaging of breast: Secondary | ICD-10-CM | POA: Diagnosis not present

## 2017-09-01 ENCOUNTER — Other Ambulatory Visit: Payer: Self-pay | Admitting: Cardiology

## 2017-09-09 DIAGNOSIS — H40033 Anatomical narrow angle, bilateral: Secondary | ICD-10-CM | POA: Diagnosis not present

## 2017-09-09 DIAGNOSIS — H401211 Low-tension glaucoma, right eye, mild stage: Secondary | ICD-10-CM | POA: Diagnosis not present

## 2017-09-09 DIAGNOSIS — H401222 Low-tension glaucoma, left eye, moderate stage: Secondary | ICD-10-CM | POA: Diagnosis not present

## 2017-09-22 DIAGNOSIS — E1351 Other specified diabetes mellitus with diabetic peripheral angiopathy without gangrene: Secondary | ICD-10-CM | POA: Diagnosis not present

## 2017-09-22 DIAGNOSIS — L602 Onychogryphosis: Secondary | ICD-10-CM | POA: Diagnosis not present

## 2017-09-30 DIAGNOSIS — M25561 Pain in right knee: Secondary | ICD-10-CM | POA: Diagnosis not present

## 2017-09-30 DIAGNOSIS — M1712 Unilateral primary osteoarthritis, left knee: Secondary | ICD-10-CM | POA: Diagnosis not present

## 2017-09-30 DIAGNOSIS — T84018D Broken internal joint prosthesis, other site, subsequent encounter: Secondary | ICD-10-CM | POA: Diagnosis not present

## 2017-09-30 DIAGNOSIS — G8929 Other chronic pain: Secondary | ICD-10-CM | POA: Diagnosis not present

## 2017-11-30 DIAGNOSIS — I10 Essential (primary) hypertension: Secondary | ICD-10-CM | POA: Diagnosis not present

## 2017-11-30 DIAGNOSIS — Z9641 Presence of insulin pump (external) (internal): Secondary | ICD-10-CM | POA: Diagnosis not present

## 2017-11-30 DIAGNOSIS — E7800 Pure hypercholesterolemia, unspecified: Secondary | ICD-10-CM | POA: Diagnosis not present

## 2017-11-30 DIAGNOSIS — Z23 Encounter for immunization: Secondary | ICD-10-CM | POA: Diagnosis not present

## 2017-11-30 DIAGNOSIS — E669 Obesity, unspecified: Secondary | ICD-10-CM | POA: Diagnosis not present

## 2017-11-30 DIAGNOSIS — G609 Hereditary and idiopathic neuropathy, unspecified: Secondary | ICD-10-CM | POA: Diagnosis not present

## 2017-11-30 DIAGNOSIS — E1165 Type 2 diabetes mellitus with hyperglycemia: Secondary | ICD-10-CM | POA: Diagnosis not present

## 2017-12-01 DIAGNOSIS — M21962 Unspecified acquired deformity of left lower leg: Secondary | ICD-10-CM | POA: Diagnosis not present

## 2017-12-01 DIAGNOSIS — L602 Onychogryphosis: Secondary | ICD-10-CM | POA: Diagnosis not present

## 2017-12-01 DIAGNOSIS — M21961 Unspecified acquired deformity of right lower leg: Secondary | ICD-10-CM | POA: Diagnosis not present

## 2017-12-01 DIAGNOSIS — M2042 Other hammer toe(s) (acquired), left foot: Secondary | ICD-10-CM | POA: Diagnosis not present

## 2017-12-01 DIAGNOSIS — M2041 Other hammer toe(s) (acquired), right foot: Secondary | ICD-10-CM | POA: Diagnosis not present

## 2017-12-01 DIAGNOSIS — E1351 Other specified diabetes mellitus with diabetic peripheral angiopathy without gangrene: Secondary | ICD-10-CM | POA: Diagnosis not present

## 2017-12-19 ENCOUNTER — Emergency Department (HOSPITAL_COMMUNITY): Payer: Medicare Other

## 2017-12-19 ENCOUNTER — Encounter (HOSPITAL_COMMUNITY): Payer: Self-pay | Admitting: Emergency Medicine

## 2017-12-19 ENCOUNTER — Emergency Department (HOSPITAL_COMMUNITY)
Admission: EM | Admit: 2017-12-19 | Discharge: 2017-12-19 | Disposition: A | Discharge status: Home / Self Care | Payer: Medicare Other | Attending: Emergency Medicine | Admitting: Emergency Medicine

## 2017-12-19 DIAGNOSIS — I10 Essential (primary) hypertension: Secondary | ICD-10-CM | POA: Insufficient documentation

## 2017-12-19 DIAGNOSIS — M7061 Trochanteric bursitis, right hip: Secondary | ICD-10-CM | POA: Diagnosis not present

## 2017-12-19 DIAGNOSIS — E119 Type 2 diabetes mellitus without complications: Secondary | ICD-10-CM | POA: Insufficient documentation

## 2017-12-19 DIAGNOSIS — Z96651 Presence of right artificial knee joint: Secondary | ICD-10-CM | POA: Diagnosis not present

## 2017-12-19 DIAGNOSIS — M25551 Pain in right hip: Secondary | ICD-10-CM | POA: Diagnosis not present

## 2017-12-19 DIAGNOSIS — Y939 Activity, unspecified: Secondary | ICD-10-CM | POA: Diagnosis not present

## 2017-12-19 DIAGNOSIS — Z79899 Other long term (current) drug therapy: Secondary | ICD-10-CM | POA: Diagnosis not present

## 2017-12-19 MED ORDER — ACETAMINOPHEN 325 MG PO TABS: 650.0000 mg | ORAL_TABLET | Freq: Four times a day (QID) | ORAL | 0 refills | Status: DC | PRN

## 2017-12-19 MED ORDER — DICLOFENAC SODIUM 1 % TD GEL: 2.0000 g | Freq: Four times a day (QID) | TRANSDERMAL | 0 refills | Status: DC

## 2017-12-19 NOTE — ED Notes (Signed)
Bed: WTR5 Expected date:  Expected time:  Means of arrival:  Comments: EMS-hip pain

## 2017-12-19 NOTE — ED Notes (Signed)
Discharge instructions reviewed with patient. Patient verbalizes understanding. VSS. Patient ambulatory from wheelchair to vehicle with assistance.

## 2017-12-19 NOTE — ED Triage Notes (Signed)
Per [facility] patient from home for right side hip pain that started this morning when woke up and moving around. Denies any falls or injuries. Vitals: 174 palpated, 96% on RA.

## 2017-12-19 NOTE — ED Provider Notes (Signed)
[facility] DEPT Provider Note   CSN: [id number] Arrival date & time: 12/19/17  1418     History   Chief Complaint Chief Complaint  Patient presents with  . Hip Pain    HPI Jessica [name] Jessica [name] is a 72 y.o. female.  HPI   Patient is 72 year old female who presents the ED today complaining of right hip pain that began when she woke up this morning.  Patient states that she does not have much pain when she is sitting, however when she tries to ambulate she has 10/10 pain with movement of the leg.  She states she has been able to get around at home with her walker, however wanted to come here to be evaluated.  She denies any recent falls or trauma.  She denies any fevers at home.  She denies any other symptoms as well as no urinary symptoms or abdominal symptoms.  Denies any saddle anesthesia.  Denies history of cancer or IV drug use.  Denies any numbness or weakness to the legs.  Does have chronic urinary incontinence when she cannot make it to the bathroom in time.  Denies any bowel incontinence.  Denies any urinary retention.  She states she does not have any back pain.  Has a h/o DM, states last A1C 5.5.  Past Medical History:  Diagnosis Date  . Anemia   . Anxiety   . Arthritis   . Cataract   . Depression   . Diabetes mellitus without complication ([place])   . Diastolic dysfunction   . Frozen shoulder   . Glaucoma   . Headache    History of migraines  . Heart murmur   . History of kidney stones   . Hyperlipidemia   . Hypertension   . Macular degeneration   . Neuropathy   . Obesity   . Sleep apnea   . Stroke [facility]    has been told by a Dr. Mort Sawyers that she may have had a mini stroke but no further testing was done to confirm  . Tachycardia     Patient Active Problem List   Diagnosis Date Noted  . Essential hypertension, benign 09/03/2014  . Morbid obesity ([place]) 03/06/2014  . HTN (hypertension) 03/06/2014  . Vertigo 03/06/2014  . Type  II or unspecified type diabetes mellitus without mention of complication, uncontrolled 03/06/2014  . Pure hypercholesterolemia 03/06/2014    Past Surgical History:  Procedure Laterality Date  . CARDIAC CATHETERIZATION     -6/09-normal ejection fraction 65%, no angiographically significant CAD., M. Skains MD  . CESAREAN SECTION    . COLONOSCOPY    . foot spurs removed    . HYSTEROSCOPY W/D&C N/A 06/28/2017   Procedure: DILATATION AND CURETTAGE /HYSTEROSCOPY/POLYPECTOMY WITH MYOSURE;  Surgeon: Christophe Louis, MD;  Location: [facility];  Service: Gynecology;  Laterality: N/A;  . JOINT REPLACEMENT     right knee replaced  . knee spurs removed    . shoulder spurs removed    . TONSILLECTOMY AND ADENOIDECTOMY    . UPPER GI ENDOSCOPY      OB History    No data available       Home Medications    Prior to Admission medications   Medication Sig Start Date End Date Taking? Authorizing Provider  acetaminophen (TYLENOL) 325 MG tablet Take 2 tablets (650 mg total) by mouth every 6 (six) hours as needed. Do not take more than 4000mg  of tylenol per day 12/19/17   Aspyn Warnke, [employer], PA-C  amoxicillin (AMOXIL) 500 MG capsule TAKE 4 TABLETS BY MOUTH 1 HOUR BEFORE APPOINTMENT 11/06/15   [provider]  aspirin 81 MG tablet Take 81 mg by mouth daily.    [provider]  B-D INS SYRINGE 0.5CC/31GX5/16 31G X 5/16" 0.5 ML MISC Use as directed per provider. 06/11/13   [provider]  Calcium Carbonate-Vitamin D 600-200 MG-UNIT TABS Take 1 tablet by mouth daily.     [provider]  clobetasol cream (TEMOVATE) 0.05 % APPLY TO AFFECTED AREAS 2 TIMES DAILY NOT TO FACE,GROIN, OR ARMPIT 11/07/15   [provider]  Coenzyme Q10 (CO Q-10) 100 MG CAPS Take 100 mg by mouth at bedtime.     [provider]  diclofenac sodium (VOLTAREN) 1 % GEL Apply 2 g topically 4 (four) times daily. 12/19/17   Redmond Whittley S, PA-C  dorzolamide-timolol (COSOPT) 22.3-6.8 MG/ML  ophthalmic solution Place 1 drop into both eyes 2 (two) times daily.  08/27/14   [provider]  doxycycline (VIBRAMYCIN) 50 MG capsule 2 po daily 06/28/17   Christophe Louis, MD  fluticasone [facility]) 50 MCG/ACT nasal spray Place 1 spray into both nostrils daily as needed for allergies or rhinitis.    [provider]  furosemide (LASIX) 40 MG tablet TAKE 1 TABLET DAILY 09/01/17   Jerline Pain, MD  Garlic 4174 MG CAPS Take 1,000 mg by mouth daily.     [provider]  ibuprofen (ADVIL,MOTRIN) 600 MG tablet 1 po every 6 hours prn for mild to moderate pain 06/28/17   Christophe Louis, MD  insulin lispro (HUMALOG) 100 UNIT/ML injection Inject into the skin daily. Insulin pump    [provider]  INVOKANA 300 MG TABS tablet Take 300 mg by mouth daily. 03/01/16   [provider]  Iron-Vitamin C (VITRON-C) 65-125 MG TABS Take 1 tablet by mouth at bedtime.     [provider]  losartan (COZAAR) 100 MG tablet TAKE 1 TABLET DAILY 09/01/17   Jerline Pain, MD  metFORMIN (GLUCOPHAGE-XR) 500 MG 24 hr tablet Take 500 mg by mouth daily with supper.    [provider]  metoprolol tartrate (LOPRESSOR) 100 MG tablet TAKE 1 TABLET TWICE A DAY 09/01/17   Jerline Pain, MD  Multiple Vitamins-Minerals (CENTRUM SILVER PO) Take 1 tablet by mouth daily.     [provider]  Multiple Vitamins-Minerals (PRESERVISION AREDS 2 PO) Take 1 tablet by mouth 2 (two) times daily.    [provider]  ONE TOUCH ULTRA TEST test strip 1 each by Other route as needed (Use as directed per provider).  08/05/14   [provider]  Probiotic Product (PROBIOTIC DAILY PO) Take 1 capsule by mouth 2 (two) times daily.    [provider]  simvastatin (ZOCOR) 40 MG tablet Take 40 mg by mouth every evening.    [provider]  spironolactone (ALDACTONE) 25 MG tablet Take 1 tablet (25 mg total) by mouth daily. 07/01/17   Jerline Pain, MD  traMADol (ULTRAM) 50  MG tablet Take 50 mg by mouth at bedtime as needed for moderate pain.    [provider]  Travoprost, BAK Free, (TRAVATAN) 0.004 % SOLN ophthalmic solution Place 1 drop into both eyes at bedtime.     [provider]  Vitamin D, Ergocalciferol, 2000 units CAPS Take 2,000 Units by mouth daily.    [provider]    Family History Family History  Problem Relation Age of Onset  .  Colon polyps Father   . Brain cancer Mother     Social History Social History   Tobacco Use  . Smoking status: Never Smoker  . Smokeless tobacco: Never Used  Substance Use Topics  . Alcohol use: No    Comment: maybe once a year  . Drug use: No     Allergies   Meloxicam; Percocet [oxycodone-acetaminophen]; Vicodin [hydrocodone-acetaminophen]; Azithromycin; and Iodine   Review of Systems Review of Systems  Constitutional: Negative for fever.  Respiratory: Negative for shortness of breath.   Cardiovascular: Negative for chest pain.  Genitourinary: Negative for flank pain and urgency.  Musculoskeletal:       Right hip pain  Neurological: Negative for weakness and numbness.     Physical Exam Updated Vital Signs BP (!) 149/58 (BP Location: Right Arm)   Pulse 81   Temp 98.2 F (36.8 C) (Oral)   Resp 18   LMP  (LMP Unknown)   SpO2 98%   Physical Exam  Constitutional: She appears well-developed and well-nourished. No distress.  HENT:  Head: Normocephalic and atraumatic.  Eyes: Conjunctivae are normal.  Neck: Neck supple.  Cardiovascular: Normal rate.  Pulmonary/Chest: Effort normal.  Musculoskeletal: Normal range of motion.  Tenderness across the right trochanteric bursa.  No cervical, thoracic, or lumbar midline tenderness.  No paraspinous muscle tenderness.  No tenderness over the bilateral sciatic notches.  Normal sensation to the upper portions of bilateral lower extremities.  Decreased sensation to the lower parts of the bilat lower extremities due to chronic  neuropathy (chronic for patient (normal strength with flexion extension abduction and abduction of the hips.  Neurological: She is alert.  Skin: Skin is warm and dry.  Psychiatric: She has a normal mood and affect.  Nursing note and vitals reviewed.    ED Treatments / Results  Labs (all labs ordered are listed, but only abnormal results are displayed) Labs Reviewed - No data to display  EKG  EKG Interpretation None       Radiology Dg Hip Unilat W Or Wo Pelvis 2-3 Views Right  Result Date: 12/19/2017 CLINICAL DATA:  Right-sided hip pain EXAM: DG HIP (WITH OR WITHOUT PELVIS) 2-3V RIGHT COMPARISON:  None. FINDINGS: SI joint degenerative changes. Calcified phleboliths in the pelvis. Pubic symphysis and rami are intact. No acute displaced fracture or malalignment. Mild degenerative changes. IMPRESSION: Mild degenerative changes.  No acute osseous abnormality. Electronically Signed   By: Donavan Foil M.D.   On: 12/19/2017 17:29    Procedures Procedures (including critical care time)  Medications Ordered in ED Medications - No data to display   Initial Impression / Assessment and Plan / ED Course  I have reviewed the triage vital signs and the nursing notes.  Pertinent labs & imaging results that were available during my care of the patient were reviewed by me and considered in my medical decision making (see chart for details).    Discussed pt presentation and exam findings with Dr. Ellender Hose, who evaluated the patient and agrees with plan to discharge her with Tylenol, diclofenac gel and orthopedic follow-up.   Final Clinical Impressions(s) / ED Diagnoses   Final diagnoses:  Trochanteric bursitis of right hip   Trochanteric bursitis   Radiology reviewed. No evidence of fracture or dislocation. Pt able to ambulate at home with her walker. Hemodynamically stable in NAD prior to dc. Discussed dx and tx with pt. Advised heat therapy, stretching, and NSAID use.  She states that  she feels comfortable going home at  this time.  Pt to f-u with ortho if symptoms persist. Return precautions given.   ED Discharge Orders        Ordered    acetaminophen (TYLENOL) 325 MG tablet  Every 6 hours PRN     12/19/17 1756    diclofenac sodium (VOLTAREN) 1 % GEL  4 times daily     12/19/17 [address], Prabhav Faulkenberry S, PA-C 12/19/17 1757    Duffy Bruce, MD 12/19/17 [id number]

## 2017-12-19 NOTE — Discharge Instructions (Signed)
You may take Tylenol for your pain as well as use the Voltaren gel on your right hip to help relieve your symptoms.  Please follow-up with your primary care doctor for recheck within the next few days.  Please also follow-up with your orthopedic doctor for further management of your symptoms.  Please return to the emergency department if you experience any fevers, worsening pain, redness or swelling to the leg, or any new or worsening symptoms.

## 2017-12-19 NOTE — ED Notes (Signed)
Pts CBG: 169

## 2017-12-22 DIAGNOSIS — M7061 Trochanteric bursitis, right hip: Secondary | ICD-10-CM | POA: Diagnosis not present

## 2018-01-05 ENCOUNTER — Encounter: Payer: Self-pay | Admitting: Physical Therapy

## 2018-01-05 ENCOUNTER — Ambulatory Visit: Payer: Medicare Other | Attending: Family Medicine | Admitting: Physical Therapy

## 2018-01-05 DIAGNOSIS — M25551 Pain in right hip: Secondary | ICD-10-CM | POA: Diagnosis not present

## 2018-01-05 DIAGNOSIS — M6281 Muscle weakness (generalized): Secondary | ICD-10-CM | POA: Diagnosis not present

## 2018-01-05 DIAGNOSIS — R262 Difficulty in walking, not elsewhere classified: Secondary | ICD-10-CM | POA: Insufficient documentation

## 2018-01-05 NOTE — Therapy (Signed)
[facility] [place], [place], [address] Phone: [phone]   Fax:  [phone]  Physical Therapy Evaluation  Patient Details  Name: Jessica [name] MRN: [id number] Date of Birth: [birth year] Referring Provider: Addison Lank   Encounter Date: 01/05/2018  PT End of Session - 01/05/18 1338    Visit Number  1    Date for PT Re-Evaluation  03/07/18    PT Start Time  1300    PT Stop Time  1344    PT Time Calculation (min)  44 min    Activity Tolerance  Patient tolerated treatment well    Behavior During Therapy  [facility] for tasks assessed/performed       Past Medical History:  Diagnosis Date  . Anemia   . Anxiety   . Arthritis   . Cataract   . Depression   . Diabetes mellitus without complication ([facility])   . Diastolic dysfunction   . Frozen shoulder   . Glaucoma   . Headache    History of migraines  . Heart murmur   . History of kidney stones   . Hyperlipidemia   . Hypertension   . Macular degeneration   . Neuropathy   . Obesity   . Sleep apnea   . Stroke [facility])    has been told by a Dr. Mort Sawyers that she may have had a mini stroke but no further testing was done to confirm  . Tachycardia     Past Surgical History:  Procedure Laterality Date  . CARDIAC CATHETERIZATION     -6/09-normal ejection fraction 65%, no angiographically significant CAD., M. Skains MD  . CESAREAN SECTION    . COLONOSCOPY    . foot spurs removed    . HYSTEROSCOPY W/D&C N/A 06/28/2017   Procedure: DILATATION AND CURETTAGE /HYSTEROSCOPY/POLYPECTOMY WITH MYOSURE;  Surgeon: Christophe Louis, MD;  Location: [facility];  Service: Gynecology;  Laterality: N/A;  . JOINT REPLACEMENT     right knee replaced  . knee spurs removed    . shoulder spurs removed    . TONSILLECTOMY AND ADENOIDECTOMY    . UPPER GI ENDOSCOPY      There were no vitals filed for this visit.   Subjective Assessment - 01/05/18 1307    Subjective  Patient reports  that she woke up on 12/19/17 with severe pain in the right hip.  She went to the ED, x-rays show mild degenerative changes in the SI and the right hip.  MD could not inject the area due to the pain she was in.  She has been on medication and a topical gel that is helping.  She cares for her 68 YO dad, reports no known cause for the pain.    Limitations  Lifting;Walking;Standing;House hold activities    Patient Stated Goals  have less pain, walk easier    Currently in Pain?  Yes    Pain Score  3     Pain Location  Hip some LBP    Pain Orientation  Right;Lateral    Pain Descriptors / Indicators  Aching;Cramping    Pain Type  Acute pain    Pain Onset  1 to 4 weeks ago    Pain Frequency  Constant    Aggravating Factors   walking, standing pain a 7-8/10    Pain Relieving Factors  rest, pain meds pain can be 2/10    Effect of Pain on Daily Activities  difficulty walking, caring for her  dad         [facility] PT Assessment - 01/05/18 0001      Assessment   Medical Diagnosis  right hip bursitis    Referring Provider  McNeill    Onset Date/Surgical Date  12/20/27    Prior Therapy  for right knee pain, and right TKR in 2009      Precautions   Precautions  None      Balance Screen   Has the patient fallen in the past 6 months  Yes    How many times?  1    Has the patient had a decrease in activity level because of a fear of falling?   Yes    Is the patient reluctant to leave their home because of a fear of falling?   No      Home Environment   Additional Comments  has a ramp, does housework, cooks, cares for her 67 year old dad      Prior Function   Level of Independence  Independent with household mobility with device    Vocation  Retired    Leisure  no exercise      ROM / Strength   AROM / PROM / Strength  AROM;Strength      AROM   Overall AROM Comments  right hip flexion 50 degrees, abduction 15 degrees with pain in the right lateral hip      Strength   Overall Strength Comments   right hip 4-/5 with pain especially with abduction      Palpation   Palpation comment  she is very tender in the right GT area, has tenderness in the SI area bilaterally, some tenderness in the right ITB      Ambulation/Gait   Gait Comments  reports in the home will walk with cane, now using a 4WW, antalgic on the right, short stance phase on the right, reports that she does not feel stable      Standardized Balance Assessment   Standardized Balance Assessment  Timed Up and Go Test             Objective measurements completed on examination: See above findings.      [facility] Adult PT Treatment/Exercise - 01/05/18 0001      Modalities   Modalities  Iontophoresis      Iontophoresis   Type of Iontophoresis  Dexamethasone    Location  right GT area    Dose  50mA    Time  4 hour patch             PT Education - 01/05/18 1337    Education provided  Yes    Education Details  HS, ITB and piriformis stretch    Person(s) Educated  Patient    Methods  Explanation;Handout;Demonstration    Comprehension  Verbalized understanding       PT Short Term Goals - 01/05/18 1342      PT SHORT TERM GOAL #1   Title  independent with intial HEP    Time  2    Period  Weeks    Status  New        PT Long Term Goals - 01/05/18 1342      PT LONG TERM GOAL #1   Title  decrease TUG to 18 seconds    Time  8    Period  Weeks    Status  New      PT LONG TERM GOAL #2   Title  decrease right hip pain 50%    Time  8    Period  Weeks    Status  New      PT LONG TERM GOAL #3   Title  walk 300 feet without difficulty using the 4WW.    Time  8    Status  Achieved      PT LONG TERM GOAL #4   Title  increase right LE strength to 4/5    Time  8    Period  Weeks    Status  New             Plan - 01/05/18 1339    Clinical Impression Statement  Patient reports that she woke up on 12/19/17 with severe pain in the right hip area, went to the ED, x-ray showed some  degenerative changes in the hip and SI area.  She is very tender in the right hip.  She was able to use the [facility] for household ambulation, going outside she uses a [employer], she uses an Web designer to do any shopping.  Has a step to gait, antalgic on the right    Clinical Presentation  Stable    Clinical Decision Making  Low    Rehab Potential  Good    PT Frequency  2x / week    PT Duration  8 weeks    PT Treatment/Interventions  ADLs/Self Care Home Management;Cryotherapy;Electrical Stimulation;Iontophoresis 4mg /ml Dexamethasone;Gait training;Neuromuscular re-education;Balance training;Therapeutic exercise;Therapeutic activities;Functional mobility training;Stair training;Patient/family education;Manual techniques    PT Next Visit Plan  slowly start flexibility and strength    Consulted and Agree with Plan of Care  Patient       Patient will benefit from skilled therapeutic intervention in order to improve the following deficits and impairments:  Abnormal gait, Decreased range of motion, Difficulty walking, Decreased activity tolerance, Pain, Impaired flexibility, Decreased mobility, Decreased strength  Visit Diagnosis: Muscle weakness (generalized) - Plan: PT plan of care cert/re-cert  Pain in right hip - Plan: PT plan of care cert/re-cert  Difficulty in walking, not elsewhere classified - Plan: PT plan of care cert/re-cert     Problem List Patient Active Problem List   Diagnosis Date Noted  . Essential hypertension, benign 09/03/2014  . Morbid obesity ([place]) 03/06/2014  . HTN (hypertension) 03/06/2014  . Vertigo 03/06/2014  . Type II or unspecified type diabetes mellitus without mention of complication, uncontrolled 03/06/2014  . Pure hypercholesterolemia 03/06/2014    Sumner Boast., PT 01/05/2018, 1:45 PM  [facility] [place], [place], [address] Phone: [phone]   Fax:  [phone]  Name:  MACKENZE GRANDISON MRN: [id number] Date of Birth: [birth year]

## 2018-01-09 ENCOUNTER — Ambulatory Visit: Payer: Medicare Other | Admitting: Physical Therapy

## 2018-01-09 ENCOUNTER — Encounter: Payer: Self-pay | Admitting: Physical Therapy

## 2018-01-09 DIAGNOSIS — R262 Difficulty in walking, not elsewhere classified: Secondary | ICD-10-CM | POA: Diagnosis not present

## 2018-01-09 DIAGNOSIS — M25551 Pain in right hip: Secondary | ICD-10-CM | POA: Diagnosis not present

## 2018-01-09 DIAGNOSIS — M6281 Muscle weakness (generalized): Secondary | ICD-10-CM

## 2018-01-09 NOTE — Therapy (Signed)
[facility] [place], [place], [address] Phone: [phone]   Fax:  [phone]  Physical Therapy Treatment  Patient Details  Name: Jessica [name] MRN: [id number] Date of Birth: Feb 04, 1946 Referring Provider: Addison Lank   Encounter Date: 01/09/2018  PT End of Session - 01/09/18 1434    Visit Number  2    Date for PT Re-Evaluation  03/07/18    PT Start Time  1443    PT Stop Time  1452    PT Time Calculation (min)  64 min    Activity Tolerance  Patient tolerated treatment well    Behavior During Therapy  [facility] for tasks assessed/performed       Past Medical History:  Diagnosis Date  . Anemia   . Anxiety   . Arthritis   . Cataract   . Depression   . Diabetes mellitus without complication ([place])   . Diastolic dysfunction   . Frozen shoulder   . Glaucoma   . Headache    History of migraines  . Heart murmur   . History of kidney stones   . Hyperlipidemia   . Hypertension   . Macular degeneration   . Neuropathy   . Obesity   . Sleep apnea   . Stroke [facility])    has been told by a Dr. Mort Sawyers that she may have had a mini stroke but no further testing was done to confirm  . Tachycardia     Past Surgical History:  Procedure Laterality Date  . CARDIAC CATHETERIZATION     -6/09-normal ejection fraction 65%, no angiographically significant CAD., M. Skains MD  . CESAREAN SECTION    . COLONOSCOPY    . foot spurs removed    . HYSTEROSCOPY W/D&C N/A 06/28/2017   Procedure: DILATATION AND CURETTAGE /HYSTEROSCOPY/POLYPECTOMY WITH MYOSURE;  Surgeon: Christophe Louis, MD;  Location: [facility];  Service: Gynecology;  Laterality: N/A;  . JOINT REPLACEMENT     right knee replaced  . knee spurs removed    . shoulder spurs removed    . TONSILLECTOMY AND ADENOIDECTOMY    . UPPER GI ENDOSCOPY      There were no vitals filed for this visit.  Subjective Assessment - 01/09/18 1356    Subjective  Back and hips hurt.   Just difficult moving    Currently in Pain?  Yes    Pain Score  5     Pain Location  Hip    Pain Orientation  Right                      [facility] Adult PT Treatment/Exercise - 01/09/18 0001      Exercises   Exercises  Knee/Hip      Knee/Hip Exercises: Stretches   Passive Hamstring Stretch  Both;3 reps;30 seconds    ITB Stretch  3 reps;Both;20 seconds    Piriformis Stretch  Both;3 reps;30 seconds      Knee/Hip Exercises: Aerobic   Nustep  level 3 x 5 minutes      Knee/Hip Exercises: Seated   Ball Squeeze  20 reps    Clamshell with TheraBand  Green 2x10 reps    Other Seated Knee/Hip Exercises  green tband scapular stabilization, pelvic mobility      Knee/Hip Exercises: Supine   Other Supine Knee/Hip Exercises  feet on ball K2C, trunk rotation, small bridges, isometric abdominals with ball      Modalities   Modalities  Electrical Stimulation;Moist Heat      Moist Heat Therapy   Number Minutes Moist Heat  15 Minutes    Moist Heat Location  Lumbar Spine      Electrical Stimulation   Electrical Stimulation Location  Lumbar area    Electrical Stimulation Action  IFC    Electrical Stimulation Parameters  sitting    Electrical Stimulation Goals  Pain      Iontophoresis   Type of Iontophoresis  Dexamethasone    Location  right GT area    Dose  31mA    Time  4 hour patch #2               PT Short Term Goals - 01/05/18 1342      PT SHORT TERM GOAL #1   Title  independent with intial HEP    Time  2    Period  Weeks    Status  New        PT Long Term Goals - 01/05/18 1342      PT LONG TERM GOAL #1   Title  decrease TUG to 18 seconds    Time  8    Period  Weeks    Status  New      PT LONG TERM GOAL #2   Title  decrease right hip pain 50%    Time  8    Period  Weeks    Status  New      PT LONG TERM GOAL #3   Title  walk 300 feet without difficulty using the 4WW.    Time  8    Status  Achieved      PT LONG TERM GOAL #4   Title   increase right LE strength to 4/5    Time  8    Period  Weeks    Status  New            Plan - 01/09/18 1437    Clinical Impression Statement  Patient very afraid of the exercises, she was hesitant to attempt, she was able to do all of them, she does have a right knee that seems to have a "clunk" I thought it could be due to tight mms so started the stretches    PT Next Visit Plan  continue to add exercises as tolerated    Consulted and Agree with Plan of Care  Patient       Patient will benefit from skilled therapeutic intervention in order to improve the following deficits and impairments:  Abnormal gait, Decreased range of motion, Difficulty walking, Decreased activity tolerance, Pain, Impaired flexibility, Decreased mobility, Decreased strength  Visit Diagnosis: Pain in right hip  Muscle weakness (generalized)  Difficulty in walking, not elsewhere classified     Problem List Patient Active Problem List   Diagnosis Date Noted  . Essential hypertension, benign 09/03/2014  . Morbid obesity ([place]) 03/06/2014  . HTN (hypertension) 03/06/2014  . Vertigo 03/06/2014  . Type II or unspecified type diabetes mellitus without mention of complication, uncontrolled 03/06/2014  . Pure hypercholesterolemia 03/06/2014    Sumner Boast., PT 01/09/2018, 2:39 PM  [facility] [place], [place], [address] Phone: [phone]   Fax:  [phone]  Name: Jessica [name] MRN: [id number] Date of Birth: 12/27/1945

## 2018-01-18 ENCOUNTER — Ambulatory Visit: Payer: Medicare Other | Admitting: Physical Therapy

## 2018-01-18 ENCOUNTER — Encounter: Payer: Self-pay | Admitting: Physical Therapy

## 2018-01-18 DIAGNOSIS — M6281 Muscle weakness (generalized): Secondary | ICD-10-CM | POA: Diagnosis not present

## 2018-01-18 DIAGNOSIS — R262 Difficulty in walking, not elsewhere classified: Secondary | ICD-10-CM

## 2018-01-18 DIAGNOSIS — M25551 Pain in right hip: Secondary | ICD-10-CM

## 2018-01-18 NOTE — Therapy (Signed)
Jessica [name], [place], [address] Phone: [phone]   Fax:  [phone]  Physical Therapy Treatment  Patient Details  Name: Jessica [name] MRN: [id number] Date of Birth: 04-29-1946 Referring Provider: Addison Lank   Encounter Date: 01/18/2018  PT End of Session - 01/18/18 0957    Visit Number  3    Date for PT Re-Evaluation  03/07/18    PT Start Time  0950    PT Stop Time  1035    PT Time Calculation (min)  45 min    Activity Tolerance  Patient limited by pain    Behavior During Therapy  [facility] for tasks assessed/performed       Past Medical History:  Diagnosis Date  . Anemia   . Anxiety   . Arthritis   . Cataract   . Depression   . Diabetes mellitus without complication ([place])   . Diastolic dysfunction   . Frozen shoulder   . Glaucoma   . Headache    History of migraines  . Heart murmur   . History of kidney stones   . Hyperlipidemia   . Hypertension   . Macular degeneration   . Neuropathy   . Obesity   . Sleep apnea   . Stroke [facility])    has been told by a Dr. Mort Sawyers that she may have had a mini stroke but no further testing was done to confirm  . Tachycardia     Past Surgical History:  Procedure Laterality Date  . CARDIAC CATHETERIZATION     -6/09-normal ejection fraction 65%, no angiographically significant CAD., M. Skains MD  . CESAREAN SECTION    . COLONOSCOPY    . foot spurs removed    . HYSTEROSCOPY W/D&C N/A 06/28/2017   Procedure: DILATATION AND CURETTAGE /HYSTEROSCOPY/POLYPECTOMY WITH MYOSURE;  Surgeon: Christophe Louis, MD;  Location: [facility];  Service: Gynecology;  Laterality: N/A;  . JOINT REPLACEMENT     right knee replaced  . knee spurs removed    . shoulder spurs removed    . TONSILLECTOMY AND ADENOIDECTOMY    . UPPER GI ENDOSCOPY      There were no vitals filed for this visit.  Subjective Assessment - 01/18/18 0950    Subjective  Patient reports a fall last  Monday, she reports that she was taking her walker out of the car and fell, reports that she has had increased LBP and left hip pain.      Currently in Pain?  Yes    Pain Score  7     Pain Location  Back    Pain Orientation  Left;Right                No data recorded       [facility] Adult PT Treatment/Exercise - 01/18/18 0001      Knee/Hip Exercises: Aerobic   Nustep  level 3 x 6 minutes      Knee/Hip Exercises: Seated   Long Arc Quad  Both;2 sets;10 reps    Cardinal [employer]  20 reps    Clamshell with [employer]    Other Seated Knee/Hip Exercises  green tband scapular stabilization, pelvic mobility    Marching  Both;2 sets;10 reps      Moist Heat Therapy   Number Minutes Moist Heat  15 Minutes      Electrical Stimulation   Electrical Stimulation Location  Lumbar area    Electrical Stimulation Action  IFC    Electrical Stimulation Parameters  sitting    Electrical Stimulation Goals  Pain               PT Short Term Goals - 01/18/18 0958      PT SHORT TERM GOAL #1   Title  independent with intial HEP    Status  On-going        PT Long Term Goals - 01/05/18 1342      PT LONG TERM GOAL #1   Title  decrease TUG to 18 seconds    Time  8    Period  Weeks    Status  New      PT LONG TERM GOAL #2   Title  decrease right hip pain 50%    Time  8    Period  Weeks    Status  New      PT LONG TERM GOAL #3   Title  walk 300 feet without difficulty using the 4WW.    Time  8    Status  Achieved      PT LONG TERM GOAL #4   Title  increase right LE strength to 4/5    Time  8    Period  Weeks    Status  New            Plan - 01/18/18 0957    Clinical Impression Statement  Patient was afraid of exercises previoulsy, had a fall last week and is now hurting more so very timid and slow with exercises, she was 20 minutes late so did easy exercises    PT Next Visit Plan  continue to add exercises as tolerated    Consulted and Agree with Plan of  Care  Patient       Patient will benefit from skilled therapeutic intervention in order to improve the following deficits and impairments:  Abnormal gait, Decreased range of motion, Difficulty walking, Decreased activity tolerance, Pain, Impaired flexibility, Decreased mobility, Decreased strength  Visit Diagnosis: Pain in right hip  Muscle weakness (generalized)  Difficulty in walking, not elsewhere classified     Problem List Patient Active Problem List   Diagnosis Date Noted  . Essential hypertension, benign 09/03/2014  . Morbid obesity ([place]) 03/06/2014  . HTN (hypertension) 03/06/2014  . Vertigo 03/06/2014  . Type II or unspecified type diabetes mellitus without mention of complication, uncontrolled 03/06/2014  . Pure hypercholesterolemia 03/06/2014    Jessica Boast., PT 01/18/2018, 10:17 AM  [place], [place], [address] Phone: [phone]   Fax:  [phone]  Name: Jessica [name] MRN: [id number] Date of Birth: 11/19/1945

## 2018-01-20 ENCOUNTER — Encounter: Payer: Self-pay | Admitting: Physical Therapy

## 2018-01-20 ENCOUNTER — Ambulatory Visit: Payer: Medicare Other | Admitting: Physical Therapy

## 2018-01-20 DIAGNOSIS — R262 Difficulty in walking, not elsewhere classified: Secondary | ICD-10-CM | POA: Diagnosis not present

## 2018-01-20 DIAGNOSIS — M25551 Pain in right hip: Secondary | ICD-10-CM

## 2018-01-20 DIAGNOSIS — M6281 Muscle weakness (generalized): Secondary | ICD-10-CM

## 2018-01-20 NOTE — Therapy (Signed)
[place], [place], [address] Phone: [phone]   Fax:  [phone]  Physical Therapy Treatment  Patient Details  Name: LAQUISHA NORTHCRAFT MRN: [id number] Date of Birth: 1946/10/06 Referring Provider: Addison Lank   Encounter Date: 01/20/2018  PT End of Session - 01/20/18 1040    Visit Number  4    Date for PT Re-Evaluation  03/07/18    PT Start Time  0240    PT Stop Time  1055    PT Time Calculation (min)  40 min    Activity Tolerance  Patient limited by pain    Behavior During Therapy  [facility] for tasks assessed/performed       Past Medical History:  Diagnosis Date  . Anemia   . Anxiety   . Arthritis   . Cataract   . Depression   . Diabetes mellitus without complication ([place])   . Diastolic dysfunction   . Frozen shoulder   . Glaucoma   . Headache    History of migraines  . Heart murmur   . History of kidney stones   . Hyperlipidemia   . Hypertension   . Macular degeneration   . Neuropathy   . Obesity   . Sleep apnea   . Stroke [facility])    has been told by a Dr. Mort Sawyers that she may have had a mini stroke but no further testing was done to confirm  . Tachycardia     Past Surgical History:  Procedure Laterality Date  . CARDIAC CATHETERIZATION     -6/09-normal ejection fraction 65%, no angiographically significant CAD., M. Skains MD  . CESAREAN SECTION    . COLONOSCOPY    . foot spurs removed    . HYSTEROSCOPY W/D&C N/A 06/28/2017   Procedure: DILATATION AND CURETTAGE /HYSTEROSCOPY/POLYPECTOMY WITH MYOSURE;  Surgeon: Christophe Louis, MD;  Location: [facility];  Service: Gynecology;  Laterality: N/A;  . JOINT REPLACEMENT     right knee replaced  . knee spurs removed    . shoulder spurs removed    . TONSILLECTOMY AND ADENOIDECTOMY    . UPPER GI ENDOSCOPY      There were no vitals filed for this visit.  Subjective Assessment - 01/20/18 1020    Subjective  "I am not going to work hard  today"    Currently in Pain?  Yes    Pain Score  5     Pain Orientation  -- R hip L shoulder                No data recorded       [facility] Adult PT Treatment/Exercise - 01/20/18 0001      Knee/Hip Exercises: Aerobic   Nustep  level 3 x 6 minutes      Knee/Hip Exercises: Seated   Long Arc Quad  Both;2 sets;10 reps    Card[employer]  20 reps    Clamshell with [employer]    Marching  Both;2 sets;10 reps      Moist Heat Therapy   Number Minutes Moist Heat  15 Minutes    Moist Heat Location  Lumbar Spine      Electrical Stimulation   Electrical Stimulation Location  Lumbar area    Electrical Stimulation Action  IFC    Electrical Stimulation Parameters  siting    Electrical Stimulation Goals  Pain      Iontophoresis   Type of Iontophoresis  Dexamethasone  Location  right GT area    Dose  4mA    Time  4 hour patch #4               PT Short Term Goals - 01/18/18 0958      PT SHORT TERM GOAL #1   Title  independent with intial HEP    Status  On-going        PT Long Term Goals - 01/05/18 1342      PT LONG TERM GOAL #1   Title  decrease TUG to 18 seconds    Time  8    Period  Weeks    Status  New      PT LONG TERM GOAL #2   Title  decrease right hip pain 50%    Time  8    Period  Weeks    Status  New      PT LONG TERM GOAL #3   Title  walk 300 feet without difficulty using the 4WW.    Time  8    Status  Achieved      PT LONG TERM GOAL #4   Title  increase right LE strength to 4/5    Time  8    Period  Weeks    Status  New            Plan - 01/20/18 1041    Clinical Impression Statement  Pt enters clinic reporting that she did not want to work hard. Pt stated that she only wanted the patch. Encourage pt to do a few exercises. No reports of pain with the exercises, She does reports some R hip pain during  aerobic warm up. While seated with E-Stim and moist heat pt monitored blood glucose levels and it went down to 67, she  ate a worthers and it began to rise.     Rehab Potential  Good    PT Frequency  2x / week    PT Duration  8 weeks    PT Treatment/Interventions  ADLs/Self Care Home Management;Cryotherapy;Electrical Stimulation;Iontophoresis 4mg /ml Dexamethasone;Gait training;Neuromuscular re-education;Balance training;Therapeutic exercise;Therapeutic activities;Functional mobility training;Stair training;Patient/family education;Manual techniques    PT Next Visit Plan  continue to add exercises as tolerated       Patient will benefit from skilled therapeutic intervention in order to improve the following deficits and impairments:  Abnormal gait, Decreased range of motion, Difficulty walking, Decreased activity tolerance, Pain, Impaired flexibility, Decreased mobility, Decreased strength  Visit Diagnosis: Pain in right hip  Muscle weakness (generalized)  Difficulty in walking, not elsewhere classified     Problem List Patient Active Problem List   Diagnosis Date Noted  . Essential hypertension, benign 09/03/2014  . Morbid obesity ([place]) 03/06/2014  . HTN (hypertension) 03/06/2014  . Vertigo 03/06/2014  . Type II or unspecified type diabetes mellitus without mention of complication, uncontrolled 03/06/2014  . Pure hypercholesterolemia 03/06/2014    Scot Jun, PTA 01/20/2018, 11:00 AM  [place], [place], [address] Phone: [phone]   Fax:  [phone]  Name: DELROSE ROHWER MRN: [id number] Date of Birth: 08/01/1946

## 2018-01-23 ENCOUNTER — Ambulatory Visit: Payer: Medicare Other | Admitting: Physical Therapy

## 2018-01-24 ENCOUNTER — Ambulatory Visit: Payer: Medicare Other | Admitting: Physical Therapy

## 2018-01-26 DIAGNOSIS — M1712 Unilateral primary osteoarthritis, left knee: Secondary | ICD-10-CM | POA: Diagnosis not present

## 2018-01-31 ENCOUNTER — Ambulatory Visit: Payer: Medicare Other | Attending: Family Medicine | Admitting: Physical Therapy

## 2018-01-31 ENCOUNTER — Encounter: Payer: Self-pay | Admitting: Physical Therapy

## 2018-01-31 DIAGNOSIS — G8929 Other chronic pain: Secondary | ICD-10-CM | POA: Diagnosis not present

## 2018-01-31 DIAGNOSIS — M25561 Pain in right knee: Secondary | ICD-10-CM | POA: Diagnosis not present

## 2018-01-31 DIAGNOSIS — M25551 Pain in right hip: Secondary | ICD-10-CM

## 2018-01-31 DIAGNOSIS — R296 Repeated falls: Secondary | ICD-10-CM | POA: Insufficient documentation

## 2018-01-31 DIAGNOSIS — R262 Difficulty in walking, not elsewhere classified: Secondary | ICD-10-CM

## 2018-01-31 DIAGNOSIS — M6281 Muscle weakness (generalized): Secondary | ICD-10-CM

## 2018-01-31 NOTE — Therapy (Signed)
[place], [place], [address] Phone: [phone]   Fax:  [phone]  Physical Therapy Treatment  Patient Details  Name: Jessica [name] MRN: [id number] Date of Birth: 1946/10/07 Referring Provider: Addison Lank   Encounter Date: 01/31/2018  PT End of Session - 01/31/18 1051    Visit Number  5    Date for PT Re-Evaluation  03/07/18    PT Start Time  9983    PT Stop Time  1106    PT Time Calculation (min)  51 min    Activity Tolerance  Patient tolerated treatment well    Behavior During Therapy  [facility] for tasks assessed/performed       Past Medical History:  Diagnosis Date  . Anemia   . Anxiety   . Arthritis   . Cataract   . Depression   . Diabetes mellitus without complication ([place])   . Diastolic dysfunction   . Frozen shoulder   . Glaucoma   . Headache    History of migraines  . Heart murmur   . History of kidney stones   . Hyperlipidemia   . Hypertension   . Macular degeneration   . Neuropathy   . Obesity   . Sleep apnea   . Stroke [facility])    has been told by a Dr. Mort Sawyers that she may have had a mini stroke but no further testing was done to confirm  . Tachycardia     Past Surgical History:  Procedure Laterality Date  . CARDIAC CATHETERIZATION     -6/09-normal ejection fraction 65%, no angiographically significant CAD., M. Skains MD  . CESAREAN SECTION    . COLONOSCOPY    . foot spurs removed    . HYSTEROSCOPY W/D&C N/A 06/28/2017   Procedure: DILATATION AND CURETTAGE /HYSTEROSCOPY/POLYPECTOMY WITH MYOSURE;  Surgeon: Christophe Louis, MD;  Location: [facility];  Service: Gynecology;  Laterality: N/A;  . JOINT REPLACEMENT     right knee replaced  . knee spurs removed    . shoulder spurs removed    . TONSILLECTOMY AND ADENOIDECTOMY    . UPPER GI ENDOSCOPY      There were no vitals filed for this visit.  Subjective Assessment - 01/31/18 1021    Subjective  Patient reports that  she saw Dr. Wynelle Link, he feels tha tthe left knee does not need surgery, Right knee he is unsure but asked her to lose weight.  She comes in her sugar is 151 and she has a new [employer] that she has not had any of, she sais"oh well, I need this"    Currently in Pain?  Yes    Pain Score  3     Pain Location  Back    Pain Orientation  Lower    Pain Descriptors / Indicators  Aching;Sore    Aggravating Factors   walking    Pain Relieving Factors  reports overall she is feeling much better and having less pain                       [facility] Adult PT Treatment/Exercise - 01/31/18 0001      Knee/Hip Exercises: Seated   Long Arc Quad  Both;2 sets;10 reps;Weights    Long Arc Quad Weight  3 lbs.    Ball Squeeze  20 reps    Clamshell with Marga Hoots    Other Seated Knee/Hip Exercises  green tband scapular stabilization,  pelvic mobility    Other Seated Knee/Hip Exercises  red tband chest press, weighted ball overhead lift and trunk rotation, ball in lap isometric abdominals    Marching  Both;2 sets;10 reps;Weights    Marching Weights  3 lbs.    Hamstring Curl  Both;2 sets;10 reps;Limitations    Hamstring Limitations  red tband      Moist Heat Therapy   Number Minutes Moist Heat  15 Minutes    Moist Heat Location  Lumbar Spine      Electrical Stimulation   Electrical Stimulation Location  Lumbar area    Electrical Stimulation Action  IFC    Electrical Stimulation Parameters  sitting    Electrical Stimulation Goals  Pain               PT Short Term Goals - 01/31/18 1052      PT SHORT TERM GOAL #1   Title  independent with intial HEP    Status  Achieved        PT Long Term Goals - 01/05/18 1342      PT LONG TERM GOAL #1   Title  decrease TUG to 18 seconds    Time  8    Period  Weeks    Status  New      PT LONG TERM GOAL #2   Title  decrease right hip pain 50%    Time  8    Period  Weeks    Status  New      PT LONG TERM GOAL #3   Title  walk 300 feet  without difficulty using the 4WW.    Time  8    Status  Achieved      PT LONG TERM GOAL #4   Title  increase right LE strength to 4/5    Time  8    Period  Weeks    Status  New            Plan - 01/31/18 1051    Clinical Impression Statement  Patient seems to be more at ease after the MD told her that seh did not need left knee surgery.  She was able to do exercises in sitting today, may try to get her doing more, she does have pain in the right knee and the left shoulder and needs PT guidance to alter motions to decrease pain and popping    PT Next Visit Plan  increase exercise as tolerated    Consulted and Agree with Plan of Care  Patient       Patient will benefit from skilled therapeutic intervention in order to improve the following deficits and impairments:  Abnormal gait, Decreased range of motion, Difficulty walking, Decreased activity tolerance, Pain, Impaired flexibility, Decreased mobility, Decreased strength  Visit Diagnosis: Pain in right hip  Muscle weakness (generalized)  Difficulty in walking, not elsewhere classified  Chronic pain of right knee     Problem List Patient Active Problem List   Diagnosis Date Noted  . Essential hypertension, benign 09/03/2014  . Morbid obesity ([place]) 03/06/2014  . HTN (hypertension) 03/06/2014  . Vertigo 03/06/2014  . Type II or unspecified type diabetes mellitus without mention of complication, uncontrolled 03/06/2014  . Pure hypercholesterolemia 03/06/2014    Sumner Boast., PT 01/31/2018, 10:53 AM  [place], [place], [address] Phone: [phone]   Fax:  [phone]  Name: Jessica [name] MRN: [id number] Date  of Birth: 1946/06/16

## 2018-02-02 ENCOUNTER — Ambulatory Visit: Payer: Medicare Other | Admitting: Physical Therapy

## 2018-02-02 ENCOUNTER — Encounter: Payer: Self-pay | Admitting: Physical Therapy

## 2018-02-02 DIAGNOSIS — M25551 Pain in right hip: Secondary | ICD-10-CM

## 2018-02-02 DIAGNOSIS — G8929 Other chronic pain: Secondary | ICD-10-CM | POA: Diagnosis not present

## 2018-02-02 DIAGNOSIS — R296 Repeated falls: Secondary | ICD-10-CM

## 2018-02-02 DIAGNOSIS — M25561 Pain in right knee: Secondary | ICD-10-CM

## 2018-02-02 DIAGNOSIS — R262 Difficulty in walking, not elsewhere classified: Secondary | ICD-10-CM | POA: Diagnosis not present

## 2018-02-02 DIAGNOSIS — M6281 Muscle weakness (generalized): Secondary | ICD-10-CM | POA: Diagnosis not present

## 2018-02-02 NOTE — Therapy (Signed)
[place], [place], [address] Phone: [phone]   Fax:  [phone]  Physical Therapy Treatment  Patient Details  Name: Jessica [name] MRN: [id number] Date of Birth: June 22, 1946 Referring Provider: Addison Lank   Encounter Date: 02/02/2018  PT End of Session - 02/02/18 1055    Visit Number  6    Date for PT Re-Evaluation  03/07/18    PT Start Time  1014    PT Stop Time  1112    PT Time Calculation (min)  58 min    Activity Tolerance  Patient tolerated treatment well    Behavior During Therapy  [facility] for tasks assessed/performed       Past Medical History:  Diagnosis Date  . Anemia   . Anxiety   . Arthritis   . Cataract   . Depression   . Diabetes mellitus without complication ([place])   . Diastolic dysfunction   . Frozen shoulder   . Glaucoma   . Headache    History of migraines  . Heart murmur   . History of kidney stones   . Hyperlipidemia   . Hypertension   . Macular degeneration   . Neuropathy   . Obesity   . Sleep apnea   . Stroke [facility])    has been told by a Dr. Mort Sawyers that she may have had a mini stroke but no further testing was done to confirm  . Tachycardia     Past Surgical History:  Procedure Laterality Date  . CARDIAC CATHETERIZATION     -6/09-normal ejection fraction 65%, no angiographically significant CAD., M. Skains MD  . CESAREAN SECTION    . COLONOSCOPY    . foot spurs removed    . HYSTEROSCOPY W/D&C N/A 06/28/2017   Procedure: DILATATION AND CURETTAGE /HYSTEROSCOPY/POLYPECTOMY WITH MYOSURE;  Surgeon: Christophe Louis, MD;  Location: [facility];  Service: Gynecology;  Laterality: N/A;  . JOINT REPLACEMENT     right knee replaced  . knee spurs removed    . shoulder spurs removed    . TONSILLECTOMY AND ADENOIDECTOMY    . UPPER GI ENDOSCOPY      There were no vitals filed for this visit.  Subjective Assessment - 02/02/18 1014    Subjective  REports that she is  feeling a little better.  She reports that she got a new walker and feels like she is walking better with it    Currently in Pain?  Yes    Pain Score  2     Pain Location  Hip    Pain Orientation  Right    Pain Descriptors / Indicators  Aching                       [facility] Adult PT Treatment/Exercise - 02/02/18 0001      Knee/Hip Exercises: Aerobic   Nustep  level 4 x 6 minutes      Knee/Hip Exercises: Seated   Long Arc Quad  Both;2 sets;10 reps;Weights;Limitations    Long Arc Quad Weight  3 lbs.    Long [employer] Limitations  I do limit right knee extension due to pain and a popping toward end range    Card[employer]  20 reps    Clamshell with [employer]    Other Seated Knee/Hip Exercises  green tband scapular stabilization, pelvic mobility    Other Seated Knee/Hip Exercises  red tband chest press, weighted  ball overhead lift and trunk rotation, ball in lap isometric abdominals    Marching  Both;2 sets;10 reps;Weights    Marching Weights  3 lbs.    Hamstring Curl  Both;2 sets;10 reps;Limitations    Hamstring Limitations  red tband      Moist Heat Therapy   Number Minutes Moist Heat  15 Minutes    Moist Heat Location  Lumbar Spine      Electrical Stimulation   Electrical Stimulation Location  Lumbar area    Electrical Stimulation Action  IFC    Electrical Stimulation Parameters  sitting    Electrical Stimulation Goals  Pain               PT Short Term Goals - 01/31/18 1052      PT SHORT TERM GOAL #1   Title  independent with intial HEP    Status  Achieved        PT Long Term Goals - 02/02/18 1057      PT LONG TERM GOAL #2   Title  decrease right hip pain 50%    Status  Partially Met      PT LONG TERM GOAL #3   Title  walk 300 feet without difficulty using the 4WW.    Status  Achieved            Plan - 02/02/18 1056    Clinical Impression Statement  Patient reports that she is feeling less pain, she is walking better with less  effort and fatigue.  Needs assist to decreased angles that hurt the right knee especially.  Her blood sugar during PT went from 151 to 110    PT Next Visit Plan  increase exercise as tolerated    Consulted and Agree with Plan of Care  Patient       Patient will benefit from skilled therapeutic intervention in order to improve the following deficits and impairments:  Abnormal gait, Decreased range of motion, Difficulty walking, Decreased activity tolerance, Pain, Impaired flexibility, Decreased mobility, Decreased strength  Visit Diagnosis: Pain in right hip  Muscle weakness (generalized)  Difficulty in walking, not elsewhere classified  Chronic pain of right knee  Repeated falls     Problem List Patient Active Problem List   Diagnosis Date Noted  . Essential hypertension, benign 09/03/2014  . Morbid obesity ([employer]) 03/06/2014  . HTN (hypertension) 03/06/2014  . Vertigo 03/06/2014  . Type II or unspecified type diabetes mellitus without mention of complication, uncontrolled 03/06/2014  . Pure hypercholesterolemia 03/06/2014    Sumner Boast., PT 02/02/2018, 10:58 AM  [place], [place], [address] Phone: [phone]   Fax:  [phone]  Name: Jessica [name] MRN: [id number] Date of Birth: Jul 06, 1946

## 2018-02-03 DIAGNOSIS — Z9641 Presence of insulin pump (external) (internal): Secondary | ICD-10-CM | POA: Diagnosis not present

## 2018-02-03 DIAGNOSIS — E1165 Type 2 diabetes mellitus with hyperglycemia: Secondary | ICD-10-CM | POA: Diagnosis not present

## 2018-02-07 ENCOUNTER — Encounter: Payer: Self-pay | Admitting: Physical Therapy

## 2018-02-07 ENCOUNTER — Ambulatory Visit: Payer: Medicare Other | Admitting: Physical Therapy

## 2018-02-07 DIAGNOSIS — R296 Repeated falls: Secondary | ICD-10-CM | POA: Diagnosis not present

## 2018-02-07 DIAGNOSIS — G8929 Other chronic pain: Secondary | ICD-10-CM | POA: Diagnosis not present

## 2018-02-07 DIAGNOSIS — M25551 Pain in right hip: Secondary | ICD-10-CM | POA: Diagnosis not present

## 2018-02-07 DIAGNOSIS — R262 Difficulty in walking, not elsewhere classified: Secondary | ICD-10-CM | POA: Diagnosis not present

## 2018-02-07 DIAGNOSIS — M25561 Pain in right knee: Secondary | ICD-10-CM | POA: Diagnosis not present

## 2018-02-07 DIAGNOSIS — M6281 Muscle weakness (generalized): Secondary | ICD-10-CM

## 2018-02-07 NOTE — Therapy (Signed)
[facility] [place], [place], [address] Phone: [phone]   Fax:  [phone]  Physical Therapy Treatment  Patient Details  Name: Jessica [name] MRN: [id number] Date of Birth: Mar 08, 1946 Referring Provider: Addison Lank   Encounter Date: 02/07/2018  PT End of Session - 02/07/18 1056    Visit Number  7    Date for PT Re-Evaluation  03/07/18    PT Start Time  7793    PT Stop Time  1111    PT Time Calculation (min)  56 min    Activity Tolerance  Patient tolerated treatment well    Behavior During Therapy  [facility] for tasks assessed/performed       Past Medical History:  Diagnosis Date  . Anemia   . Anxiety   . Arthritis   . Cataract   . Depression   . Diabetes mellitus without complication ([place])   . Diastolic dysfunction   . Frozen shoulder   . Glaucoma   . Headache    History of migraines  . Heart murmur   . History of kidney stones   . Hyperlipidemia   . Hypertension   . Macular degeneration   . Neuropathy   . Obesity   . Sleep apnea   . Stroke [facility])    has been told by a Dr. Mort Sawyers that she may have had a mini stroke but no further testing was done to confirm  . Tachycardia     Past Surgical History:  Procedure Laterality Date  . CARDIAC CATHETERIZATION     -6/09-normal ejection fraction 65%, no angiographically significant CAD., M. Skains MD  . CESAREAN SECTION    . COLONOSCOPY    . foot spurs removed    . HYSTEROSCOPY W/D&C N/A 06/28/2017   Procedure: DILATATION AND CURETTAGE /HYSTEROSCOPY/POLYPECTOMY WITH MYOSURE;  Surgeon: Christophe Louis, MD;  Location: [facility];  Service: Gynecology;  Laterality: N/A;  . JOINT REPLACEMENT     right knee replaced  . knee spurs removed    . shoulder spurs removed    . TONSILLECTOMY AND ADENOIDECTOMY    . UPPER GI ENDOSCOPY      There were no vitals filed for this visit.  Subjective Assessment - 02/07/18 1020    Subjective  Pt stated that her hip  is feeling better but still cant lift her L arm up high    Currently in Pain?  Yes    Pain Score  10-Worst pain ever    Pain Location  Shoulder    Pain Orientation  Left                       [facility] Adult PT Treatment/Exercise - 02/07/18 0001      Knee/Hip Exercises: Aerobic   Nustep  level 4 x 10 minutes      Knee/Hip Exercises: Seated   Long Arc Quad  Both;2 sets;10 reps;Weights;Limitations    Long Arc Quad Weight  3 lbs.    Long [employer] Limitations  I do limit right knee extension due to pain and a popping toward end range    [employer]  20 reps    Clamshell with TheraBand  Green    Marching  Both;2 sets;10 reps;Weights    Marching Weights  3 lbs.    Hamstring Curl  Both;2 sets;10 reps;Limitations    Hamstring Limitations  red tband      Moist Heat Therapy   Number  Minutes Moist Heat  15 Minutes    Moist Heat Location  Lumbar Spine;Shoulder      Electrical Stimulation   Electrical Stimulation Location  Lumbar area    Electrical Stimulation Action  IFC    Electrical Stimulation Parameters  sitting    Electrical Stimulation Goals  Pain               PT Short Term Goals - 01/31/18 1052      PT SHORT TERM GOAL #1   Title  independent with intial HEP    Status  Achieved        PT Long Term Goals - 02/02/18 1057      PT LONG TERM GOAL #2   Title  decrease right hip pain 50%    Status  Partially Met      PT LONG TERM GOAL #3   Title  walk 300 feet without difficulty using the 4WW.    Status  Achieved            Plan - 02/07/18 1058    Clinical Impression Statement  Pt enters clinic reporting increase L shoulder pain that she rated 109/10 so no interventions performed with LUE. All seated hip exercises performed well. She reports that her hip feel a lot better overall.    Rehab Potential  Good    PT Frequency  2x / week    PT Duration  8 weeks    PT Treatment/Interventions  ADLs/Self Care Home Management;Cryotherapy;Electrical  Stimulation;Iontophoresis 34m/ml Dexamethasone;Gait training;Neuromuscular re-education;Balance training;Therapeutic exercise;Therapeutic activities;Functional mobility training;Stair training;Patient/family education;Manual techniques    PT Next Visit Plan  increase exercise as tolerated       Patient will benefit from skilled therapeutic intervention in order to improve the following deficits and impairments:  Abnormal gait, Decreased range of motion, Difficulty walking, Decreased activity tolerance, Pain, Impaired flexibility, Decreased mobility, Decreased strength  Visit Diagnosis: Muscle weakness (generalized)  Pain in right hip  Difficulty in walking, not elsewhere classified     Problem List Patient Active Problem List   Diagnosis Date Noted  . Essential hypertension, benign 09/03/2014  . Morbid obesity ([place] 03/06/2014  . HTN (hypertension) 03/06/2014  . Vertigo 03/06/2014  . Type II or unspecified type diabetes mellitus without mention of complication, uncontrolled 03/06/2014  . Pure hypercholesterolemia 03/06/2014    RScot Jun PTA 02/07/2018, 11:01 AM  [place] [address]Phone: [phone]  Fax:  [phone] Name: Jessica MONTIJOMRN: [id number] of Birth: 806/26/1947

## 2018-02-08 ENCOUNTER — Ambulatory Visit: Payer: Medicare Other | Admitting: Physical Therapy

## 2018-02-08 ENCOUNTER — Encounter: Payer: Self-pay | Admitting: Physical Therapy

## 2018-02-08 DIAGNOSIS — R262 Difficulty in walking, not elsewhere classified: Secondary | ICD-10-CM | POA: Diagnosis not present

## 2018-02-08 DIAGNOSIS — R296 Repeated falls: Secondary | ICD-10-CM | POA: Diagnosis not present

## 2018-02-08 DIAGNOSIS — G8929 Other chronic pain: Secondary | ICD-10-CM | POA: Diagnosis not present

## 2018-02-08 DIAGNOSIS — M6281 Muscle weakness (generalized): Secondary | ICD-10-CM

## 2018-02-08 DIAGNOSIS — M25551 Pain in right hip: Secondary | ICD-10-CM | POA: Diagnosis not present

## 2018-02-08 DIAGNOSIS — M25561 Pain in right knee: Secondary | ICD-10-CM | POA: Diagnosis not present

## 2018-02-08 NOTE — Therapy (Signed)
[facility], [place], [address] Phone: [phone]   Fax:  [phone]  Physical Therapy Treatment  Patient Details  Name: Jessica [name] MRN: [id number] Date of Birth: [birth year] Referring Provider: Addison Lank   Encounter Date: 02/08/2018  PT End of Session - 02/08/18 1108    Visit Number  8    Date for PT Re-Evaluation  03/07/18    PT Start Time  1020    PT Stop Time  1113    PT Time Calculation (min)  53 min    Activity Tolerance  Patient tolerated treatment well    Behavior During Therapy  [facility] for tasks assessed/performed       Past Medical History:  Diagnosis Date  . Anemia   . Anxiety   . Arthritis   . Cataract   . Depression   . Diabetes mellitus without complication ([place])   . Diastolic dysfunction   . Frozen shoulder   . Glaucoma   . Headache    History of migraines  . Heart murmur   . History of kidney stones   . Hyperlipidemia   . Hypertension   . Macular degeneration   . Neuropathy   . Obesity   . Sleep apnea   . Stroke [facility])    has been told by a Dr. Mort Sawyers that she may have had a mini stroke but no further testing was done to confirm  . Tachycardia     Past Surgical History:  Procedure Laterality Date  . CARDIAC CATHETERIZATION     -6/09-normal ejection fraction 65%, no angiographically significant CAD., M. Skains MD  . CESAREAN SECTION    . COLONOSCOPY    . foot spurs removed    . HYSTEROSCOPY W/D&C N/A 06/28/2017   Procedure: DILATATION AND CURETTAGE /HYSTEROSCOPY/POLYPECTOMY WITH MYOSURE;  Surgeon: Christophe Louis, MD;  Location: [facility];  Service: Gynecology;  Laterality: N/A;  . JOINT REPLACEMENT     right knee replaced  . knee spurs removed    . shoulder spurs removed    . TONSILLECTOMY AND ADENOIDECTOMY    . UPPER GI ENDOSCOPY      There were no vitals filed for this visit.  Subjective Assessment - 02/08/18 1022    Subjective  pt is feeling better  but she reports that she is sore all over from yesterday's pt session    Currently in Pain?  Yes    Pain Score  8     Pain Location  Shoulder    Pain Orientation  Left                       [facility] Adult PT Treatment/Exercise - 02/08/18 0001      Knee/Hip Exercises: Aerobic   Nustep  L5 x 7 min      Knee/Hip Exercises: Standing   Hip Abduction  AROM;Both;2 sets;10 reps with 3lb cuff weights     Hip Extension  AROM;Both;2 sets;10 reps with 3lb cuff weights    Other Standing Knee Exercises  1x 30sec balance, 2x closed eyes feet together, 1xxx    Other Standing Knee Exercises  Marching with 3 lb cuff weights      Knee/Hip Exercises: Seated   Sit to Sand  10 reps VC needed to not use hands, and LOB x1      Moist Heat Therapy   Number Minutes Moist Heat  15 Minutes    Moist Heat  Location  Shoulder Left shoulder               PT Short Term Goals - 01/31/18 1052      PT SHORT TERM GOAL #1   Title  independent with intial HEP    Status  Achieved        PT Long Term Goals - 02/02/18 1057      PT LONG TERM GOAL #2   Title  decrease right hip pain 50%    Status  Partially Met      PT LONG TERM GOAL #3   Title  walk 300 feet without difficulty using the 4WW.    Status  Achieved            Plan - 02/08/18 1109    Clinical Impression Statement  Pt reported that she was feeling better today despite that she was sore from yesterday's PT session. Pt needed cuing to not grab walker during balance exercises. At the end of PT she brought a topical ointment (Diclofenac) prescribed to her from home and requested it to be rubbed on her left shoulder.    Clinical Presentation  Stable    Clinical Decision Making  Low    Rehab Potential  Good    PT Frequency  2x / week    PT Duration  8 weeks    PT Treatment/Interventions  ADLs/Self Care Home Management;Cryotherapy;Electrical Stimulation;Iontophoresis 13m/ml Dexamethasone;Gait training;Neuromuscular  re-education;Balance training;Therapeutic exercise;Therapeutic activities;Functional mobility training;Stair training;Patient/family education;Manual techniques    PT Next Visit Plan  Continue with standing balance and strengthening exercises.       Patient will benefit from skilled therapeutic intervention in order to improve the following deficits and impairments:  Abnormal gait, Decreased range of motion, Difficulty walking, Decreased activity tolerance, Pain, Impaired flexibility, Decreased mobility, Decreased strength  Visit Diagnosis: Muscle weakness (generalized)  Difficulty in walking, not elsewhere classified     Problem List Patient Active Problem List   Diagnosis Date Noted  . Essential hypertension, benign 09/03/2014  . Morbid obesity ([place] 03/06/2014  . HTN (hypertension) 03/06/2014  . Vertigo 03/06/2014  . Type II or unspecified type diabetes mellitus without mention of complication, uncontrolled 03/06/2014  . Pure hypercholesterolemia 03/06/2014    KLoyal Gambler4/17/2019, 12:15 PM  [place] [address]Phone: [phone]  Fax:  [phone] Name: Jessica MULAMRN: [id number] of Birth: [birth year]

## 2018-02-09 DIAGNOSIS — L602 Onychogryphosis: Secondary | ICD-10-CM | POA: Diagnosis not present

## 2018-02-09 DIAGNOSIS — M6281 Muscle weakness (generalized): Secondary | ICD-10-CM | POA: Diagnosis not present

## 2018-02-09 DIAGNOSIS — R2689 Other abnormalities of gait and mobility: Secondary | ICD-10-CM | POA: Diagnosis not present

## 2018-02-09 DIAGNOSIS — S92912A Unspecified fracture of left toe(s), initial encounter for closed fracture: Secondary | ICD-10-CM | POA: Diagnosis not present

## 2018-02-09 DIAGNOSIS — E1351 Other specified diabetes mellitus with diabetic peripheral angiopathy without gangrene: Secondary | ICD-10-CM | POA: Diagnosis not present

## 2018-02-14 ENCOUNTER — Encounter: Payer: Self-pay | Admitting: Physical Therapy

## 2018-02-14 ENCOUNTER — Ambulatory Visit: Payer: Medicare Other | Admitting: Physical Therapy

## 2018-02-14 DIAGNOSIS — R262 Difficulty in walking, not elsewhere classified: Secondary | ICD-10-CM

## 2018-02-14 DIAGNOSIS — G8929 Other chronic pain: Secondary | ICD-10-CM | POA: Diagnosis not present

## 2018-02-14 DIAGNOSIS — R296 Repeated falls: Secondary | ICD-10-CM | POA: Diagnosis not present

## 2018-02-14 DIAGNOSIS — M6281 Muscle weakness (generalized): Secondary | ICD-10-CM | POA: Diagnosis not present

## 2018-02-14 DIAGNOSIS — M25551 Pain in right hip: Secondary | ICD-10-CM

## 2018-02-14 DIAGNOSIS — M25561 Pain in right knee: Secondary | ICD-10-CM | POA: Diagnosis not present

## 2018-02-14 NOTE — Therapy (Signed)
[place], [place], [address] Phone: [phone]   Fax:  [phone]  Physical Therapy Treatment  Patient Details  Name: Jessica [name] MRN: [id number] Date of Birth: [birth year] Referring Provider: Addison Lank   Encounter Date: 02/14/2018  PT End of Session - 02/14/18 1106    Visit Number  9    Date for PT Re-Evaluation  03/07/18    PT Start Time  1020    PT Stop Time  1101    PT Time Calculation (min)  41 min    Activity Tolerance  Patient tolerated treatment well    Behavior During Therapy  [facility] for tasks assessed/performed       Past Medical History:  Diagnosis Date  . Anemia   . Anxiety   . Arthritis   . Cataract   . Depression   . Diabetes mellitus without complication ([place])   . Diastolic dysfunction   . Frozen shoulder   . Glaucoma   . Headache    History of migraines  . Heart murmur   . History of kidney stones   . Hyperlipidemia   . Hypertension   . Macular degeneration   . Neuropathy   . Obesity   . Sleep apnea   . Stroke [facility])    has been told by a Dr. Mort Sawyers that she may have had a mini stroke but no further testing was done to confirm  . Tachycardia     Past Surgical History:  Procedure Laterality Date  . CARDIAC CATHETERIZATION     -6/09-normal ejection fraction 65%, no angiographically significant CAD., M. Skains MD  . CESAREAN SECTION    . COLONOSCOPY    . foot spurs removed    . HYSTEROSCOPY W/D&C N/A 06/28/2017   Procedure: DILATATION AND CURETTAGE /HYSTEROSCOPY/POLYPECTOMY WITH MYOSURE;  Surgeon: Christophe Louis, MD;  Location: [facility];  Service: Gynecology;  Laterality: N/A;  . JOINT REPLACEMENT     right knee replaced  . knee spurs removed    . shoulder spurs removed    . TONSILLECTOMY AND ADENOIDECTOMY    . UPPER GI ENDOSCOPY      There were no vitals filed for this visit.  Subjective Assessment - 02/14/18 1022    Subjective  Pt reported that she  visited the doctor and that her R foot has a fracture. She also reports that her doctor is enrolling her into the "safe step" program. She reports that her neuropathy makes it difficult for her to report pain levels.    Currently in Pain?  Yes    Pain Score  4     Pain Orientation  Left                       [facility] Adult PT Treatment/Exercise - 02/14/18 0001      Knee/Hip Exercises: Aerobic   Nustep  L5 x 7 min      Knee/Hip Exercises: Seated   Long Arc Quad  Both;2 sets;10 reps;Weights    Long Arc Quad Weight  3 lbs.    Ball Squeeze  20 reps with ball    Clamshell with TheraBand  Green 3x10    Other Seated Knee/Hip Exercises  seated rows with red theraband 2x10    Marching  AROM;Strengthening;Both;2 sets;10 reps;Weights vc needed not to laterally bend to lift knee    Marching Limitations  3lb cuff weights    Hamstring Curl  AROM;Strengthening;Both;2  sets;15 reps    Hamstring Limitations  red tband               PT Short Term Goals - 01/31/18 1052      PT SHORT TERM GOAL #1   Title  independent with intial HEP    Status  Achieved        PT Long Term Goals - 02/02/18 1057      PT LONG TERM GOAL #2   Title  decrease right hip pain 50%    Status  Partially Met      PT LONG TERM GOAL #3   Title  walk 300 feet without difficulty using the 4WW.    Status  Achieved            Plan - 02/14/18 1106    Clinical Impression Statement  pt reported that she went to the podiatrist last thursday who x rayed her right foot. Her doctor reported to her that she sustained a fracture in the right foot, so today's therapeutic exercises were seated to reduced stress on the right foot. Her doctor is prescribing special bracing and home exercises throught the "safe step" program. Pt required vc cuing to not lateral lean when doing seated marching.She requested biofreeze to be sprayed on her left shoulder at the end of treatment.    Rehab Potential  Good    PT  Frequency  2x / week    PT Duration  8 weeks    PT Treatment/Interventions  ADLs/Self Care Home Management;Cryotherapy;Electrical Stimulation;Iontophoresis 81m/ml Dexamethasone;Gait training;Neuromuscular re-education;Balance training;Therapeutic exercise;Therapeutic activities;Functional mobility training;Stair training;Patient/family education;Manual techniques    PT Next Visit Plan  continue with seated exercises       Patient will benefit from skilled therapeutic intervention in order to improve the following deficits and impairments:  Abnormal gait, Decreased range of motion, Difficulty walking, Decreased activity tolerance, Pain, Impaired flexibility, Decreased mobility, Decreased strength  Visit Diagnosis: Muscle weakness (generalized)  Difficulty in walking, not elsewhere classified  Pain in right hip     Problem List Patient Active Problem List   Diagnosis Date Noted  . Essential hypertension, benign 09/03/2014  . Morbid obesity ([place] 03/06/2014  . HTN (hypertension) 03/06/2014  . Vertigo 03/06/2014  . Type II or unspecified type diabetes mellitus without mention of complication, uncontrolled 03/06/2014  . Pure hypercholesterolemia 03/06/2014    KLoyal Gambler4/23/2019, 11:26 AM  [place] [address]Phone: [phone]  Fax:  [phone] Name: Jessica IMBERMRN: [id number] of Birth: [birth year]

## 2018-02-16 ENCOUNTER — Encounter: Payer: Self-pay | Admitting: Physical Therapy

## 2018-02-16 ENCOUNTER — Ambulatory Visit: Payer: Medicare Other | Admitting: Physical Therapy

## 2018-02-16 DIAGNOSIS — M25551 Pain in right hip: Secondary | ICD-10-CM | POA: Diagnosis not present

## 2018-02-16 DIAGNOSIS — M25561 Pain in right knee: Secondary | ICD-10-CM | POA: Diagnosis not present

## 2018-02-16 DIAGNOSIS — R296 Repeated falls: Secondary | ICD-10-CM | POA: Diagnosis not present

## 2018-02-16 DIAGNOSIS — M6281 Muscle weakness (generalized): Secondary | ICD-10-CM

## 2018-02-16 DIAGNOSIS — R262 Difficulty in walking, not elsewhere classified: Secondary | ICD-10-CM | POA: Diagnosis not present

## 2018-02-16 DIAGNOSIS — G8929 Other chronic pain: Secondary | ICD-10-CM | POA: Diagnosis not present

## 2018-02-16 NOTE — Therapy (Signed)
[place], [place], [address] Phone: [phone]   Fax:  [phone]  Physical Therapy Treatment Covers the period from visit 1-10 Patient Details  Name: Jessica [name] MRN: [id number] Date of Birth: May 31, 1946 Referring Provider: Addison Lank   Encounter Date: 02/16/2018  PT End of Session - 02/16/18 1058    Visit Number  10    Date for PT Re-Evaluation  03/07/18    PT Start Time  4315    PT Stop Time  1058    PT Time Calculation (min)  43 min    Activity Tolerance  Patient tolerated treatment well    Behavior During Therapy  [facility] for tasks assessed/performed       Past Medical History:  Diagnosis Date  . Anemia   . Anxiety   . Arthritis   . Cataract   . Depression   . Diabetes mellitus without complication ([place])   . Diastolic dysfunction   . Frozen shoulder   . Glaucoma   . Headache    History of migraines  . Heart murmur   . History of kidney stones   . Hyperlipidemia   . Hypertension   . Macular degeneration   . Neuropathy   . Obesity   . Sleep apnea   . Stroke [facility])    has been told by a Dr. Mort Sawyers that she may have had a mini stroke but no further testing was done to confirm  . Tachycardia     Past Surgical History:  Procedure Laterality Date  . CARDIAC CATHETERIZATION     -6/09-normal ejection fraction 65%, no angiographically significant CAD., M. Skains MD  . CESAREAN SECTION    . COLONOSCOPY    . foot spurs removed    . HYSTEROSCOPY W/D&C N/A 06/28/2017   Procedure: DILATATION AND CURETTAGE /HYSTEROSCOPY/POLYPECTOMY WITH MYOSURE;  Surgeon: Christophe Louis, MD;  Location: [facility];  Service: Gynecology;  Laterality: N/A;  . JOINT REPLACEMENT     right knee replaced  . knee spurs removed    . shoulder spurs removed    . TONSILLECTOMY AND ADENOIDECTOMY    . UPPER GI ENDOSCOPY      There were no vitals filed for this visit.  Subjective Assessment - 02/16/18 1023    Subjective  "I feel ok"    Currently in Pain?  Yes    Pain Score  4     Pain Location  Shoulder    Pain Orientation  Left                       [facility] Adult PT Treatment/Exercise - 02/16/18 0001      High Level Balance   High Level Balance Comments  standing ball toss      Knee/Hip Exercises: Aerobic   Nustep  L5 x 7 min      Knee/Hip Exercises: Seated   Long Arc Quad  Both;2 sets;10 reps;Weights    Long Arc Quad Weight  3 lbs.    Ball Squeeze  20 reps    Clamshell with Marga Hoots    Other Seated Knee/Hip Exercises  seated rows with red theraband 2x10    Marching  AROM;Strengthening;Both;2 sets;10 reps;Weights    Marching Limitations  3lb cuff weights    Hamstring Curl  AROM;Strengthening;Both;2 sets;15 reps    Hamstring Limitations  red tband  PT Short Term Goals - 01/31/18 1052      PT SHORT TERM GOAL #1   Title  independent with intial HEP    Status  Achieved        PT Long Term Goals - 02/02/18 1057      PT LONG TERM GOAL #2   Title  decrease right hip pain 50%    Status  Partially Met      PT LONG TERM GOAL #3   Title  walk 300 feet without difficulty using the 4WW.    Status  Achieved            Plan - 02/16/18 1100    Clinical Impression Statement  Pt does well with all seated resisted exercises, Progressed to standing ball toss. Pt not willing to rotate much during ball toss. Some instability with tossing the ball back    Rehab Potential  Good    PT Frequency  2x / week    PT Duration  8 weeks    PT Treatment/Interventions  ADLs/Self Care Home Management;Cryotherapy;Electrical Stimulation;Iontophoresis 51m/ml Dexamethasone;Gait training;Neuromuscular re-education;Balance training;Therapeutic exercise;Therapeutic activities;Functional mobility training;Stair training;Patient/family education;Manual techniques    PT Next Visit Plan  continue with seated exercises       Patient will benefit from skilled  therapeutic intervention in order to improve the following deficits and impairments:  Abnormal gait, Decreased range of motion, Difficulty walking, Decreased activity tolerance, Pain, Impaired flexibility, Decreased mobility, Decreased strength  Visit Diagnosis: Muscle weakness (generalized)  Difficulty in walking, not elsewhere classified  Pain in right hip     Problem List Patient Active Problem List   Diagnosis Date Noted  . Essential hypertension, benign 09/03/2014  . Morbid obesity ([place] 03/06/2014  . HTN (hypertension) 03/06/2014  . Vertigo 03/06/2014  . Type II or unspecified type diabetes mellitus without mention of complication, uncontrolled 03/06/2014  . Pure hypercholesterolemia 03/06/2014    RScot Jun PTA 02/16/2018, 11:03 AM  [place] [address]Phone: [phone]  Fax:  [phone] Name: Jessica IVYMRN: [id number] of Birth: 8April 06, 1947

## 2018-02-20 ENCOUNTER — Ambulatory Visit: Payer: Medicare Other | Admitting: Physical Therapy

## 2018-02-20 ENCOUNTER — Encounter: Payer: Self-pay | Admitting: Physical Therapy

## 2018-02-20 DIAGNOSIS — R296 Repeated falls: Secondary | ICD-10-CM | POA: Diagnosis not present

## 2018-02-20 DIAGNOSIS — R262 Difficulty in walking, not elsewhere classified: Secondary | ICD-10-CM

## 2018-02-20 DIAGNOSIS — M6281 Muscle weakness (generalized): Secondary | ICD-10-CM | POA: Diagnosis not present

## 2018-02-20 DIAGNOSIS — M25561 Pain in right knee: Secondary | ICD-10-CM | POA: Diagnosis not present

## 2018-02-20 DIAGNOSIS — M25551 Pain in right hip: Secondary | ICD-10-CM | POA: Diagnosis not present

## 2018-02-20 DIAGNOSIS — G8929 Other chronic pain: Secondary | ICD-10-CM | POA: Diagnosis not present

## 2018-02-20 NOTE — Therapy (Signed)
[place], [place], [address] Phone: [phone]   Fax:  [phone]  Physical Therapy Treatment  Patient Details  Name: Jessica [name] MRN: [id number] Date of Birth: [birth year] Referring Provider: Addison Lank   Encounter Date: 02/20/2018  PT End of Session - 02/20/18 1235    Visit Number  11    Date for PT Re-Evaluation  03/07/18    PT Start Time  1148    PT Stop Time  1233    PT Time Calculation (min)  45 min    Activity Tolerance  Patient tolerated treatment well    Behavior During Therapy  [facility] for tasks assessed/performed       Past Medical History:  Diagnosis Date  . Anemia   . Anxiety   . Arthritis   . Cataract   . Depression   . Diabetes mellitus without complication ([place])   . Diastolic dysfunction   . Frozen shoulder   . Glaucoma   . Headache    History of migraines  . Heart murmur   . History of kidney stones   . Hyperlipidemia   . Hypertension   . Macular degeneration   . Neuropathy   . Obesity   . Sleep apnea   . Stroke [facility])    has been told by a Dr. Mort Sawyers that she may have had a mini stroke but no further testing was done to confirm  . Tachycardia     Past Surgical History:  Procedure Laterality Date  . CARDIAC CATHETERIZATION     -6/09-normal ejection fraction 65%, no angiographically significant CAD., M. Skains MD  . CESAREAN SECTION    . COLONOSCOPY    . foot spurs removed    . HYSTEROSCOPY W/D&C N/A 06/28/2017   Procedure: DILATATION AND CURETTAGE /HYSTEROSCOPY/POLYPECTOMY WITH MYOSURE;  Surgeon: Christophe Louis, MD;  Location: [facility];  Service: Gynecology;  Laterality: N/A;  . JOINT REPLACEMENT     right knee replaced  . knee spurs removed    . shoulder spurs removed    . TONSILLECTOMY AND ADENOIDECTOMY    . UPPER GI ENDOSCOPY      There were no vitals filed for this visit.  Subjective Assessment - 02/20/18 1156    Subjective  "I have less pain in  my shoulder"    Currently in Pain?  Yes    Pain Score  2     Pain Location  Shoulder    Pain Orientation  Left                       [facility] Adult PT Treatment/Exercise - 02/20/18 0001      Knee/Hip Exercises: Aerobic   Nustep  L4x8 min      Knee/Hip Exercises: Seated   Long Arc Quad  Both;2 sets;20 reps    Long Arc Quad Weight  3 lbs.    Ball Squeeze  2x20 reps    Clamshell with Marga Hoots    Other Seated Knee/Hip Exercises  Standing balance, closed eyes feet together 4x 30    Other Seated Knee/Hip Exercises  seated march on airex 1x10 vc for posture and leaning to lift leg    Marching  AROM;Strengthening;Both;2 sets;20 reps vc not shift weight to lift leg    Marching Limitations  3lb cuff weights    Hamstring Curl  AROM;Strengthening;Both;2 sets;20 reps 1x10 on L with red TB    Hamstring Limitations  Green TB               PT Short Term Goals - 01/31/18 1052      PT SHORT TERM GOAL #1   Title  independent with intial HEP    Status  Achieved        PT Long Term Goals - 02/02/18 1057      PT LONG TERM GOAL #2   Title  decrease right hip pain 50%    Status  Partially Met      PT LONG TERM GOAL #3   Title  walk 300 feet without difficulty using the 4WW.    Status  Achieved            Plan - 02/20/18 1235    Clinical Impression Statement  Pt is doing well, there is still emphasis with seated resisted exercises due to her foot fracture. Pt struggled with eyes closed with feet together balance exercises. She did will with seated march on airex pad however she needed vc for posture and not rocking back and forth to lift leg. She reports that she is being fitted for a special LE brace and that she has been doing her HEP.     Rehab Potential  Good    PT Frequency  2x / week    PT Duration  8 weeks    PT Treatment/Interventions  ADLs/Self Care Home Management;Cryotherapy;Electrical Stimulation;Iontophoresis 53m/ml Dexamethasone;Gait  training;Neuromuscular re-education;Balance training;Therapeutic exercise;Therapeutic activities;Functional mobility training;Stair training;Patient/family education;Manual techniques    PT Next Visit Plan  continue with seated and functional balance exercises       Patient will benefit from skilled therapeutic intervention in order to improve the following deficits and impairments:  Abnormal gait, Decreased range of motion, Difficulty walking, Decreased activity tolerance, Pain, Impaired flexibility, Decreased mobility, Decreased strength  Visit Diagnosis: Muscle weakness (generalized)  Difficulty in walking, not elsewhere classified  Pain in right hip     Problem List Patient Active Problem List   Diagnosis Date Noted  . Essential hypertension, benign 09/03/2014  . Morbid obesity ([place] 03/06/2014  . HTN (hypertension) 03/06/2014  . Vertigo 03/06/2014  . Type II or unspecified type diabetes mellitus without mention of complication, uncontrolled 03/06/2014  . Pure hypercholesterolemia 03/06/2014    KLoyal Gambler4/29/2019, 12:42 PM  [address] [address]Phone: [phone]  Fax:  [phone] Name: Jessica STOKKEMRN: [id number] of Birth: [birth year]

## 2018-02-24 ENCOUNTER — Encounter: Payer: Self-pay | Admitting: Physical Therapy

## 2018-02-24 ENCOUNTER — Ambulatory Visit: Payer: Medicare Other | Attending: Family Medicine | Admitting: Physical Therapy

## 2018-02-24 DIAGNOSIS — R262 Difficulty in walking, not elsewhere classified: Secondary | ICD-10-CM | POA: Diagnosis not present

## 2018-02-24 DIAGNOSIS — M6281 Muscle weakness (generalized): Secondary | ICD-10-CM | POA: Insufficient documentation

## 2018-02-24 DIAGNOSIS — M25551 Pain in right hip: Secondary | ICD-10-CM | POA: Insufficient documentation

## 2018-02-24 DIAGNOSIS — M25561 Pain in right knee: Secondary | ICD-10-CM | POA: Insufficient documentation

## 2018-02-24 DIAGNOSIS — R296 Repeated falls: Secondary | ICD-10-CM | POA: Insufficient documentation

## 2018-02-24 DIAGNOSIS — G8929 Other chronic pain: Secondary | ICD-10-CM | POA: Insufficient documentation

## 2018-02-24 NOTE — Therapy (Signed)
[place], [place], [address] Phone: [phone]   Fax:  [phone]  Physical Therapy Treatment  Patient Details  Name: Jessica [name] MRN: [id number] Date of Birth: [birth year] Referring Provider: Addison Lank   Encounter Date: 02/24/2018  PT End of Session - 02/24/18 1200    Visit Number  12    Date for PT Re-Evaluation  03/07/18    PT Start Time  1020    PT Stop Time  1100    PT Time Calculation (min)  40 min    Activity Tolerance  Patient tolerated treatment well    Behavior During Therapy  [facility] for tasks assessed/performed       Past Medical History:  Diagnosis Date   Anemia    Anxiety    Arthritis    Cataract    Depression    Diabetes mellitus without complication (HCC)    Diastolic dysfunction    Frozen shoulder    Glaucoma    Headache    History of migraines   Heart murmur    History of kidney stones    Hyperlipidemia    Hypertension    Macular degeneration    Neuropathy    Obesity    Sleep apnea    Stroke [facility])    has been told by a Dr. Mort Sawyers that she may have had a mini stroke but no further testing was done to confirm   Tachycardia     Past Surgical History:  Procedure Laterality Date   CARDIAC CATHETERIZATION     -6/09-normal ejection fraction 65%, no angiographically significant CAD., M. Skains MD   CESAREAN SECTION     COLONOSCOPY     foot spurs removed     HYSTEROSCOPY W/D&C N/A 06/28/2017   Procedure: DILATATION AND CURETTAGE /HYSTEROSCOPY/POLYPECTOMY WITH MYOSURE;  Surgeon: Christophe Louis, MD;  Location: [facility];  Service: Gynecology;  Laterality: N/A;   JOINT REPLACEMENT     right knee replaced   knee spurs removed     shoulder spurs removed     TONSILLECTOMY AND ADENOIDECTOMY     UPPER GI ENDOSCOPY      There were no vitals filed for this visit.  Subjective Assessment - 02/24/18 1028    Subjective  Hip is practically  good.  Left shoulder pain persists.  Left foot "fracture" is healing.      Currently in Pain?  Yes    Pain Score  2     Pain Location  Hip    Pain Orientation  Right    Multiple Pain Sites  Yes    Pain Location  Shoulder                       [facility] Adult PT Treatment/Exercise - 02/24/18 0001      Knee/Hip Exercises: Aerobic   Nustep  L4x8 min      Knee/Hip Exercises: Seated   Long Arc Quad  Both;2 sets;20 reps    Long Arc Quad Weight  3 lbs.    Ball Squeeze  2x20 reps    Clamshell with Marga Hoots    Other Seated Knee/Hip Exercises  Standing balance, closed eyes feet together 4x 30    Other Seated Knee/Hip Exercises  seated march on airex 1x10    Marching  AROM;Strengthening;Both;2 sets;20 reps    Marching Limitations  3lb cuff weights    Hamstring Curl  AROM;Strengthening;Both;2 sets;20 reps  Hamstring Limitations  Green TB    Sit to [employer]  10 reps;2 sets               PT Short Term Goals - 01/31/18 1052      PT SHORT TERM GOAL #1   Title  independent with intial HEP    Status  Achieved        PT Long Term Goals - 02/02/18 1057      PT LONG TERM GOAL #2   Title  decrease right hip pain 50%    Status  Partially Met      PT LONG TERM GOAL #3   Title  walk 300 feet without difficulty using the 4WW.    Status  Achieved            Plan - 02/24/18 1200    Clinical Impression Statement  Cont with dependence on rollator walker for ambulation.  Ongoing core weakness, but does tolerate treatment interventions fairly well.        Patient will benefit from skilled therapeutic intervention in order to improve the following deficits and impairments:     Visit Diagnosis: Muscle weakness (generalized)  Difficulty in walking, not elsewhere classified  Pain in right hip  Chronic pain of right knee  Repeated falls     Problem List Patient Active Problem List   Diagnosis Date Noted   Essential hypertension, benign 09/03/2014    Morbid obesity ([place]) 03/06/2014   HTN (hypertension) 03/06/2014   Vertigo 03/06/2014   Type II or unspecified type diabetes mellitus without mention of complication, uncontrolled 03/06/2014   Pure hypercholesterolemia 03/06/2014    Olean Ree, PTA 02/24/2018, 12:03 PM  [place] [address] [place], [place], [address] Phone: [phone]   Fax:  [phone]  Name: Jessica [name] MRN: [id number] Date of Birth: [birth year]

## 2018-02-28 ENCOUNTER — Ambulatory Visit: Payer: Medicare Other | Admitting: Physical Therapy

## 2018-02-28 ENCOUNTER — Encounter: Payer: Self-pay | Admitting: Physical Therapy

## 2018-02-28 DIAGNOSIS — M25551 Pain in right hip: Secondary | ICD-10-CM | POA: Diagnosis not present

## 2018-02-28 DIAGNOSIS — M25561 Pain in right knee: Secondary | ICD-10-CM | POA: Diagnosis not present

## 2018-02-28 DIAGNOSIS — R262 Difficulty in walking, not elsewhere classified: Secondary | ICD-10-CM

## 2018-02-28 DIAGNOSIS — R296 Repeated falls: Secondary | ICD-10-CM

## 2018-02-28 DIAGNOSIS — M6281 Muscle weakness (generalized): Secondary | ICD-10-CM | POA: Diagnosis not present

## 2018-02-28 DIAGNOSIS — G8929 Other chronic pain: Secondary | ICD-10-CM | POA: Diagnosis not present

## 2018-02-28 NOTE — Therapy (Signed)
[place], [place], [address] Phone: [phone]   Fax:  [phone]  Physical Therapy Treatment  Patient Details  Name: Jessica [name] MRN: [id number] Date of Birth: 1945/10/30 Referring Provider: Addison Lank   Encounter Date: 02/28/2018  PT End of Session - 02/28/18 1100    Visit Number  13    Date for PT Re-Evaluation  03/07/18    PT Start Time  3810    PT Stop Time  1100    PT Time Calculation (min)  45 min    Activity Tolerance  Patient tolerated treatment well    Behavior During Therapy  [facility] for tasks assessed/performed       Past Medical History:  Diagnosis Date  . Anemia   . Anxiety   . Arthritis   . Cataract   . Depression   . Diabetes mellitus without complication ([place])   . Diastolic dysfunction   . Frozen shoulder   . Glaucoma   . Headache    History of migraines  . Heart murmur   . History of kidney stones   . Hyperlipidemia   . Hypertension   . Macular degeneration   . Neuropathy   . Obesity   . Sleep apnea   . Stroke [facility])    has been told by a Dr. Mort Sawyers that she may have had a mini stroke but no further testing was done to confirm  . Tachycardia     Past Surgical History:  Procedure Laterality Date  . CARDIAC CATHETERIZATION     -6/09-normal ejection fraction 65%, no angiographically significant CAD., M. Skains MD  . CESAREAN SECTION    . COLONOSCOPY    . foot spurs removed    . HYSTEROSCOPY W/D&C N/A 06/28/2017   Procedure: DILATATION AND CURETTAGE /HYSTEROSCOPY/POLYPECTOMY WITH MYOSURE;  Surgeon: Christophe Louis, MD;  Location: [facility];  Service: Gynecology;  Laterality: N/A;  . JOINT REPLACEMENT     right knee replaced  . knee spurs removed    . shoulder spurs removed    . TONSILLECTOMY AND ADENOIDECTOMY    . UPPER GI ENDOSCOPY      There were no vitals filed for this visit.  Subjective Assessment - 02/28/18 1022    Subjective  "good" Pt stated she  did her stretches this morning and has some soreness    Currently in Pain?  Yes    Pain Score  -- sore         [facility] PT Assessment - 02/28/18 0001      Standardized Balance Assessment   Standardized Balance Assessment  Timed Up and Go Test      Timed Up and Go Test   TUG  Normal TUG    Normal TUG (seconds)  13.75    TUG Comments  rolator                    [facility] Adult PT Treatment/Exercise - 02/28/18 0001      Knee/Hip Exercises: Standing   Other Standing Knee Exercises  Marching with 3 lb cuff weights      Knee/Hip Exercises: Seated   Long Arc Quad  Both;2 sets;20 reps    Long Arc Quad Weight  3 lbs.    Ball Squeeze  2x20 reps    Clamshell with Marga Hoots    Other Seated Knee/Hip Exercises  ab sets with p ball 2x10    Other Seated Knee/Hip Exercises  bilas seated hip flwx x10    Hamstring Curl  AROM;Strengthening;Both;2 sets;20 reps    Hamstring Limitations  Green TB    Sit to [employer]  10 reps;2 sets               PT Short Term Goals - 01/31/18 1052      PT SHORT TERM GOAL #1   Title  independent with intial HEP    Status  Achieved        PT Long Term Goals - 02/28/18 1031      PT LONG TERM GOAL #1   Title  decrease TUG to 18 seconds    Status  Achieved            Plan - 02/28/18 1100    Clinical Impression Statement  Ongoing core and LE weakness. No reports of pain only muscle soreness. Tolerates treatment well. Pt has progressed and met some goals.     Rehab Potential  Good    PT Duration  8 weeks    PT Treatment/Interventions  ADLs/Self Care Home Management;Cryotherapy;Electrical Stimulation;Iontophoresis 105m/ml Dexamethasone;Gait training;Neuromuscular re-education;Balance training;Therapeutic exercise;Therapeutic activities;Functional mobility training;Stair training;Patient/family education;Manual techniques    PT Next Visit Plan  continue with seated and functional balance exercises       Patient will benefit from  skilled therapeutic intervention in order to improve the following deficits and impairments:  Abnormal gait, Decreased range of motion, Difficulty walking, Decreased activity tolerance, Pain, Impaired flexibility, Decreased mobility, Decreased strength  Visit Diagnosis: Muscle weakness (generalized)  Difficulty in walking, not elsewhere classified  Pain in right hip  Repeated falls  Chronic pain of right knee     Problem List Patient Active Problem List   Diagnosis Date Noted  . Essential hypertension, benign 09/03/2014  . Morbid obesity ([place] 03/06/2014  . HTN (hypertension) 03/06/2014  . Vertigo 03/06/2014  . Type II or unspecified type diabetes mellitus without mention of complication, uncontrolled 03/06/2014  . Pure hypercholesterolemia 03/06/2014    RScot Jun PTA 02/28/2018, 11:01 AM  [place] [address]Phone: [phone]  Fax:  [phone] Name: Jessica BARILMRN: [id number] of Birth: 819-Jun-1947

## 2018-03-02 ENCOUNTER — Encounter: Payer: Self-pay | Admitting: Physical Therapy

## 2018-03-02 ENCOUNTER — Ambulatory Visit: Payer: Medicare Other | Admitting: Physical Therapy

## 2018-03-02 DIAGNOSIS — R262 Difficulty in walking, not elsewhere classified: Secondary | ICD-10-CM

## 2018-03-02 DIAGNOSIS — M25551 Pain in right hip: Secondary | ICD-10-CM | POA: Diagnosis not present

## 2018-03-02 DIAGNOSIS — M25561 Pain in right knee: Secondary | ICD-10-CM | POA: Diagnosis not present

## 2018-03-02 DIAGNOSIS — R296 Repeated falls: Secondary | ICD-10-CM | POA: Diagnosis not present

## 2018-03-02 DIAGNOSIS — G8929 Other chronic pain: Secondary | ICD-10-CM | POA: Diagnosis not present

## 2018-03-02 DIAGNOSIS — M6281 Muscle weakness (generalized): Secondary | ICD-10-CM

## 2018-03-02 NOTE — Therapy (Signed)
[place], [place], [address] Phone: [phone]   Fax:  [phone]  Physical Therapy Treatment  Patient Details  Name: Jessica [name] MRN: [id number] Date of Birth: 1946-03-12 Referring Provider: Addison Lank   Encounter Date: 03/02/2018  PT End of Session - 03/02/18 1052    Visit Number  14    Date for PT Re-Evaluation  03/07/18    PT Start Time  3267    PT Stop Time  1055    PT Time Calculation (min)  40 min    Activity Tolerance  Patient tolerated treatment well    Behavior During Therapy  [facility] for tasks assessed/performed       Past Medical History:  Diagnosis Date  . Anemia   . Anxiety   . Arthritis   . Cataract   . Depression   . Diabetes mellitus without complication ([place])   . Diastolic dysfunction   . Frozen shoulder   . Glaucoma   . Headache    History of migraines  . Heart murmur   . History of kidney stones   . Hyperlipidemia   . Hypertension   . Macular degeneration   . Neuropathy   . Obesity   . Sleep apnea   . Stroke [facility])    has been told by a Dr. Mort Sawyers that she may have had a mini stroke but no further testing was done to confirm  . Tachycardia     Past Surgical History:  Procedure Laterality Date  . CARDIAC CATHETERIZATION     -6/09-normal ejection fraction 65%, no angiographically significant CAD., M. Skains MD  . CESAREAN SECTION    . COLONOSCOPY    . foot spurs removed    . HYSTEROSCOPY W/D&C N/A 06/28/2017   Procedure: DILATATION AND CURETTAGE /HYSTEROSCOPY/POLYPECTOMY WITH MYOSURE;  Surgeon: Christophe Louis, MD;  Location: [facility];  Service: Gynecology;  Laterality: N/A;  . JOINT REPLACEMENT     right knee replaced  . knee spurs removed    . shoulder spurs removed    . TONSILLECTOMY AND ADENOIDECTOMY    . UPPER GI ENDOSCOPY      There were no vitals filed for this visit.  Subjective Assessment - 03/02/18 1017    Subjective  "Im doing better, I  watered the lawn and made a dump cake this morning"    Currently in Pain?  Yes    Pain Score  2     Pain Location  Shoulder    Pain Orientation  Left;Right                       [facility] Adult PT Treatment/Exercise - 03/02/18 0001      High Level Balance   High Level Balance Activities  Backward walking with rollator      Knee/Hip Exercises: Aerobic   Nustep  L4x8 min      Knee/Hip Exercises: Seated   Long Arc Quad  Both;2 sets;10 reps    Long Arc Quad Weight  3 lbs.    Ball Squeeze  2x20 reps    Other Seated Knee/Hip Exercises  ab sets with p ball 2x10    Marching  AROM;Strengthening;Both;2 sets;20 reps    Marching Limitations  3lb cuff weights    Hamstring Curl  Strengthening;Both;2 sets;15 reps    Hamstring Limitations  Green TB    Abduction/Adduction   2 sets;15 reps;Both    Abd/Adduction Limitations  green tband    Sit to [place]  10 reps;2 sets;without UE support holding red ball                PT Short Term Goals - 01/31/18 1052      PT SHORT TERM GOAL #1   Title  independent with intial HEP    Status  Achieved        PT Long Term Goals - 02/28/18 1031      PT LONG TERM GOAL #1   Title  decrease TUG to 18 seconds    Status  Achieved            Plan - 03/02/18 1053    Clinical Impression Statement  Improvement reported overall, no issues with today's activities. R foot pain reported with seated march. Her core fatigues quick with seated ab sets.     Rehab Potential  Good    PT Frequency  2x / week    PT Duration  8 weeks    PT Treatment/Interventions  ADLs/Self Care Home Management;Cryotherapy;Electrical Stimulation;Iontophoresis 4mg /ml Dexamethasone;Gait training;Neuromuscular re-education;Balance training;Therapeutic exercise;Therapeutic activities;Functional mobility training;Stair training;Patient/family education;Manual techniques    PT Next Visit Plan  continue with seated and functional balance exercises       Patient will  benefit from skilled therapeutic intervention in order to improve the following deficits and impairments:  Abnormal gait, Decreased range of motion, Difficulty walking, Decreased activity tolerance, Pain, Impaired flexibility, Decreased mobility, Decreased strength  Visit Diagnosis: Difficulty in walking, not elsewhere classified  Muscle weakness (generalized)  Pain in right hip     Problem List Patient Active Problem List   Diagnosis Date Noted  . Essential hypertension, benign 09/03/2014  . Morbid obesity ([place]) 03/06/2014  . HTN (hypertension) 03/06/2014  . Vertigo 03/06/2014  . Type II or unspecified type diabetes mellitus without mention of complication, uncontrolled 03/06/2014  . Pure hypercholesterolemia 03/06/2014    Scot Jun, PTA 03/02/2018, 10:54 AM  [place], [place], [address] Phone: [phone]   Fax:  [phone]  Name: Jessica [name] MRN: [id number] Date of Birth: 09-Oct-1946

## 2018-03-03 DIAGNOSIS — E1165 Type 2 diabetes mellitus with hyperglycemia: Secondary | ICD-10-CM | POA: Diagnosis not present

## 2018-03-03 DIAGNOSIS — Z9641 Presence of insulin pump (external) (internal): Secondary | ICD-10-CM | POA: Diagnosis not present

## 2018-03-07 ENCOUNTER — Encounter: Payer: Self-pay | Admitting: Physical Therapy

## 2018-03-07 ENCOUNTER — Ambulatory Visit: Payer: Medicare Other | Admitting: Physical Therapy

## 2018-03-07 DIAGNOSIS — M6281 Muscle weakness (generalized): Secondary | ICD-10-CM

## 2018-03-07 DIAGNOSIS — M25551 Pain in right hip: Secondary | ICD-10-CM | POA: Diagnosis not present

## 2018-03-07 DIAGNOSIS — R296 Repeated falls: Secondary | ICD-10-CM | POA: Diagnosis not present

## 2018-03-07 DIAGNOSIS — R262 Difficulty in walking, not elsewhere classified: Secondary | ICD-10-CM | POA: Diagnosis not present

## 2018-03-07 DIAGNOSIS — G8929 Other chronic pain: Secondary | ICD-10-CM | POA: Diagnosis not present

## 2018-03-07 DIAGNOSIS — M25561 Pain in right knee: Secondary | ICD-10-CM | POA: Diagnosis not present

## 2018-03-07 NOTE — Therapy (Signed)
[place], [place], [address] Phone: [phone]   Fax:  [phone]  Physical Therapy Treatment  Patient Details  Name: Jessica [name] MRN: [id number] Date of Birth: November 07, 1945 Referring Provider: Addison Lank   Encounter Date: 03/07/2018  PT End of Session - 03/07/18 1055    Visit Number  15    Date for PT Re-Evaluation  03/07/18    PT Start Time  1019    PT Stop Time  1100    PT Time Calculation (min)  41 min    Activity Tolerance  Patient tolerated treatment well    Behavior During Therapy  [facility] for tasks assessed/performed       Past Medical History:  Diagnosis Date  . Anemia   . Anxiety   . Arthritis   . Cataract   . Depression   . Diabetes mellitus without complication ([place])   . Diastolic dysfunction   . Frozen shoulder   . Glaucoma   . Headache    History of migraines  . Heart murmur   . History of kidney stones   . Hyperlipidemia   . Hypertension   . Macular degeneration   . Neuropathy   . Obesity   . Sleep apnea   . Stroke [facility])    has been told by a Dr. Mort Sawyers that she may have had a mini stroke but no further testing was done to confirm  . Tachycardia     Past Surgical History:  Procedure Laterality Date  . CARDIAC CATHETERIZATION     -6/09-normal ejection fraction 65%, no angiographically significant CAD., M. Skains MD  . CESAREAN SECTION    . COLONOSCOPY    . foot spurs removed    . HYSTEROSCOPY W/D&C N/A 06/28/2017   Procedure: DILATATION AND CURETTAGE /HYSTEROSCOPY/POLYPECTOMY WITH MYOSURE;  Surgeon: Christophe Louis, MD;  Location: [facility];  Service: Gynecology;  Laterality: N/A;  . JOINT REPLACEMENT     right knee replaced  . knee spurs removed    . shoulder spurs removed    . TONSILLECTOMY AND ADENOIDECTOMY    . UPPER GI ENDOSCOPY      There were no vitals filed for this visit.  Subjective Assessment - 03/07/18 1020    Subjective  "My shoulder was  about an 8 when I got up" Walking and balance are ok but she is careful    Currently in Pain?  Yes    Pain Score  6     Pain Location  Shoulder    Pain Orientation  Right                       [facility] Adult PT Treatment/Exercise - 03/07/18 0001      Ambulation/Gait   Ambulation/Gait  Yes    Ambulation/Gait Assistance  5: Supervision    Ambulation Distance (Feet)  -- ~ 400     Assistive device  4-wheeled walker    Gait Pattern  Step-to pattern    Ambulation Surface  Unlevel;Outdoor;Paved    Gait Comments  waked ouside arounf front island and aback without rest, Very slow gait       Knee/Hip Exercises: Aerobic   Nustep  L5 x8 min      Knee/Hip Exercises: Seated   Ball Squeeze  2x20 reps    Sit to [employer]  10 reps;2 sets;without UE support holding red ball  PT Short Term Goals - 01/31/18 1052      PT SHORT TERM GOAL #1   Title  independent with intial HEP    Status  Achieved        PT Long Term Goals - 02/28/18 1031      PT LONG TERM GOAL #1   Title  decrease TUG to 18 seconds    Status  Achieved            Plan - 03/07/18 1056    Clinical Impression Statement  All interventions tolerated well, Increase time needed for outdoor ambulation. No reports of hip or knee pain. She does report some pain and discomfort in L shoulder.     Rehab Potential  Good    PT Frequency  2x / week    PT Duration  8 weeks    PT Treatment/Interventions  ADLs/Self Care Home Management;Cryotherapy;Electrical Stimulation;Iontophoresis 4mg /ml Dexamethasone;Gait training;Neuromuscular re-education;Balance training;Therapeutic exercise;Therapeutic activities;Functional mobility training;Stair training;Patient/family education;Manual techniques    PT Next Visit Plan  continue with seated and functional balance exercises       Patient will benefit from skilled therapeutic intervention in order to improve the following deficits and impairments:  Abnormal gait,  Decreased range of motion, Difficulty walking, Decreased activity tolerance, Pain, Impaired flexibility, Decreased mobility, Decreased strength  Visit Diagnosis: Difficulty in walking, not elsewhere classified  Muscle weakness (generalized)  Pain in right hip  Repeated falls     Problem List Patient Active Problem List   Diagnosis Date Noted  . Essential hypertension, benign 09/03/2014  . Morbid obesity ([place]) 03/06/2014  . HTN (hypertension) 03/06/2014  . Vertigo 03/06/2014  . Type II or unspecified type diabetes mellitus without mention of complication, uncontrolled 03/06/2014  . Pure hypercholesterolemia 03/06/2014    Scot Jun, PTA 03/07/2018, 10:58 AM  [place], [place], [address] Phone: [phone]   Fax:  [phone]  Name: Jessica [name] MRN: [id number] Date of Birth: 01/27/1946

## 2018-03-09 ENCOUNTER — Encounter: Payer: Self-pay | Admitting: Physical Therapy

## 2018-03-09 ENCOUNTER — Ambulatory Visit: Payer: Medicare Other | Admitting: Physical Therapy

## 2018-03-09 DIAGNOSIS — M6281 Muscle weakness (generalized): Secondary | ICD-10-CM

## 2018-03-09 DIAGNOSIS — R262 Difficulty in walking, not elsewhere classified: Secondary | ICD-10-CM | POA: Diagnosis not present

## 2018-03-09 DIAGNOSIS — M25561 Pain in right knee: Secondary | ICD-10-CM | POA: Diagnosis not present

## 2018-03-09 DIAGNOSIS — M25551 Pain in right hip: Secondary | ICD-10-CM | POA: Diagnosis not present

## 2018-03-09 DIAGNOSIS — R296 Repeated falls: Secondary | ICD-10-CM | POA: Diagnosis not present

## 2018-03-09 DIAGNOSIS — G8929 Other chronic pain: Secondary | ICD-10-CM | POA: Diagnosis not present

## 2018-03-09 NOTE — Therapy (Signed)
[facility] [address] [place], [place], [address] Phone: [phone]   Fax:  [phone]  Physical Therapy Treatment  Patient Details  Name: Jessica [name] MRN: [id number] Date of Birth: 04/28/1946 Referring Provider: McNeill   Encounter Date: 03/09/2018  PT End of Session - 03/09/18 1056    Visit Number  16    Date for PT Re-Evaluation  03/07/18    PT Start Time  1015    PT Stop Time  1056    PT Time Calculation (min)  41 min    Activity Tolerance  Patient tolerated treatment well    Behavior During Therapy  WFL for tasks assessed/performed       Past Medical History:  Diagnosis Date  . Anemia   . Anxiety   . Arthritis   . Cataract   . Depression   . Diabetes mellitus without complication (HCC)   . Diastolic dysfunction   . Frozen shoulder   . Glaucoma   . Headache    History of migraines  . Heart murmur   . History of kidney stones   . Hyperlipidemia   . Hypertension   . Macular degeneration   . Neuropathy   . Obesity   . Sleep apnea   . Stroke (HCC)    has been told by a Dr. Asif Qadri that she may have had a mini stroke but no further testing was done to confirm  . Tachycardia     Past Surgical History:  Procedure Laterality Date  . CARDIAC CATHETERIZATION     -6/09-normal ejection fraction 65%, no angiographically significant CAD., M. Skains MD  . CESAREAN SECTION    . COLONOSCOPY    . foot spurs removed    . HYSTEROSCOPY W/D&C N/A 06/28/2017   Procedure: DILATATION AND CURETTAGE /HYSTEROSCOPY/POLYPECTOMY WITH MYOSURE;  Surgeon: Cole, Tara, MD;  Location: [facility];  Service: Gynecology;  Laterality: N/A;  . JOINT REPLACEMENT     right knee replaced  . knee spurs removed    . shoulder spurs removed    . TONSILLECTOMY AND ADENOIDECTOMY    . UPPER GI ENDOSCOPY      There were no vitals filed for this visit.  Subjective Assessment - 03/09/18 1019    Subjective  "OK, neither knee is  hurting, My shoulder feels little better"    Currently in Pain?  Yes    Pain Score  6     Pain Location  Shoulder    Pain Orientation  Left         [facility] PT Assessment - 03/09/18 0001      Strength   Overall Strength Comments  right hip 4+/5                    [facility] Adult PT Treatment/Exercise - 03/09/18 0001      Ambulation/Gait   Ambulation/Gait  Yes    Ambulation/Gait Assistance  5: Supervision    Assistive device  4-wheeled walker    Gait Pattern  Step-to pattern    Ambulation Surface  Unlevel;Outdoor;Paved    Gait Comments  waked ouside arounf front island and aback without rest, Very slow gait       Knee/Hip Exercises: Aerobic   Nustep  L5 x8 min      Knee/Hip Exercises: Seated   Ball Squeeze  2x20 reps    Hamstring Curl  Strengthening;Both;2 sets;15 reps    Hamstring Limitations  Green TB                 PT Short Term Goals - 01/31/18 1052      PT SHORT TERM GOAL #1   Title  independent with intial HEP    Status  Achieved        PT Long Term Goals - 03/09/18 1021      PT LONG TERM GOAL #1   Title  decrease TUG to 18 seconds    Status  Achieved      PT LONG TERM GOAL #2   Title  decrease right hip pain 50%    Status  Achieved      PT LONG TERM GOAL #3   Title  walk 300 feet without difficulty using the 4WW.    Status  Achieved      PT LONG TERM GOAL #5   Title  decrease right LE pain 50%    Status  Achieved            Plan - 03/09/18 1058    Clinical Impression Statement  Pt has progressed and met all goals. She reports that she will be receiving braces on her legs Tuesday. Would like to see her one more time after she received braced    Rehab Potential  Good    PT Frequency  2x / week    PT Duration  8 weeks    PT Treatment/Interventions  ADLs/Self Care Home Management;Cryotherapy;Electrical Stimulation;Iontophoresis 4mg/ml Dexamethasone;Gait training;Neuromuscular re-education;Balance training;Therapeutic  exercise;Therapeutic activities;Functional mobility training;Stair training;Patient/family education;Manual techniques    PT Next Visit Plan  D/C next visit       Patient will benefit from skilled therapeutic intervention in order to improve the following deficits and impairments:  Abnormal gait, Decreased range of motion, Difficulty walking, Decreased activity tolerance, Pain, Impaired flexibility, Decreased mobility, Decreased strength  Visit Diagnosis: Difficulty in walking, not elsewhere classified  Muscle weakness (generalized)  Pain in right hip  Repeated falls     Problem List Patient Active Problem List   Diagnosis Date Noted  . Essential hypertension, benign 09/03/2014  . Morbid obesity (HCC) 03/06/2014  . HTN (hypertension) 03/06/2014  . Vertigo 03/06/2014  . Type II or unspecified type diabetes mellitus without mention of complication, uncontrolled 03/06/2014  . Pure hypercholesterolemia 03/06/2014    Ronald G Pemberton, PTA 03/09/2018, 10:59 AM  [facility] [address] [place], , [address] Phone: [phone]   Fax:  [phone]  Name: Jessica [name] MRN: [id number] Date of Birth: 12/12/1945   

## 2018-03-13 ENCOUNTER — Ambulatory Visit: Payer: Medicare Other | Admitting: Physical Therapy

## 2018-03-15 ENCOUNTER — Ambulatory Visit: Payer: Medicare Other | Admitting: Physical Therapy

## 2018-03-15 ENCOUNTER — Encounter: Payer: Self-pay | Admitting: Physical Therapy

## 2018-03-15 DIAGNOSIS — M6281 Muscle weakness (generalized): Secondary | ICD-10-CM | POA: Diagnosis not present

## 2018-03-15 DIAGNOSIS — M25551 Pain in right hip: Secondary | ICD-10-CM | POA: Diagnosis not present

## 2018-03-15 DIAGNOSIS — R296 Repeated falls: Secondary | ICD-10-CM

## 2018-03-15 DIAGNOSIS — R262 Difficulty in walking, not elsewhere classified: Secondary | ICD-10-CM | POA: Diagnosis not present

## 2018-03-15 DIAGNOSIS — M25561 Pain in right knee: Secondary | ICD-10-CM | POA: Diagnosis not present

## 2018-03-15 DIAGNOSIS — G8929 Other chronic pain: Secondary | ICD-10-CM | POA: Diagnosis not present

## 2018-03-15 NOTE — Therapy (Signed)
[facility] [place], [place], [address] Phone: [phone]   Fax:  [phone]  Physical Therapy Treatment  Patient Details  Name: Jessica [name] MRN: [id number] Date of Birth: 1946-06-26 Referring Provider: Addison Lank   Encounter Date: 03/15/2018  PT End of Session - 03/15/18 1004    Visit Number  17    Date for PT Re-Evaluation  03/07/18    PT Start Time  0930    PT Stop Time  1004    PT Time Calculation (min)  34 min    Activity Tolerance  Patient tolerated treatment well    Behavior During Therapy  [facility] for tasks assessed/performed       Past Medical History:  Diagnosis Date  . Anemia   . Anxiety   . Arthritis   . Cataract   . Depression   . Diabetes mellitus without complication ([place])   . Diastolic dysfunction   . Frozen shoulder   . Glaucoma   . Headache    History of migraines  . Heart murmur   . History of kidney stones   . Hyperlipidemia   . Hypertension   . Macular degeneration   . Neuropathy   . Obesity   . Sleep apnea   . Stroke [facility])    has been told by a Dr. Mort Sawyers that she may have had a mini stroke but no further testing was done to confirm  . Tachycardia     Past Surgical History:  Procedure Laterality Date  . CARDIAC CATHETERIZATION     -6/09-normal ejection fraction 65%, no angiographically significant CAD., M. Skains MD  . CESAREAN SECTION    . COLONOSCOPY    . foot spurs removed    . HYSTEROSCOPY W/D&C N/A 06/28/2017   Procedure: DILATATION AND CURETTAGE /HYSTEROSCOPY/POLYPECTOMY WITH MYOSURE;  Surgeon: Christophe Louis, MD;  Location: [facility];  Service: Gynecology;  Laterality: N/A;  . JOINT REPLACEMENT     right knee replaced  . knee spurs removed    . shoulder spurs removed    . TONSILLECTOMY AND ADENOIDECTOMY    . UPPER GI ENDOSCOPY      There were no vitals filed for this visit.  Subjective Assessment - 03/15/18 0937    Subjective  Pt enters clinic with  new braces on her ankles. Reports using her cane more yesterday    Currently in Pain?  Yes    Pain Score  5     Pain Location  Shoulder    Pain Orientation  Left                       [facility] Adult PT Treatment/Exercise - 03/15/18 0001      Ambulation/Gait   Ambulation/Gait  Yes    Ambulation/Gait Assistance  5: Supervision    Assistive device  4-wheeled walker    Gait Pattern  Step-to pattern    Ambulation Surface  Unlevel;Paved;Outdoor    Gait Comments  waked ouside arounf front [employer] and aback without rest, Very slow gait       Knee/Hip Exercises: Aerobic   Nustep  L5 x8 min      Knee/Hip Exercises: Seated   Marching  Both;2 sets;10 reps RUE on rollator     Sit to Sand  10 reps;2 sets;without UE support               PT Short Term Goals - 01/31/18 1052  PT SHORT TERM GOAL #1   Title  independent with intial HEP    Status  Achieved        PT Long Term Goals - 03/15/18 0956      PT LONG TERM GOAL #1   Title  decrease TUG to 18 seconds    Status  Achieved      PT LONG TERM GOAL #2   Title  decrease right hip pain 50%    Status  Achieved      PT LONG TERM GOAL #3   Title  walk 300 feet without difficulty using the 4WW.    Status  Achieved      PT LONG TERM GOAL #4   Title  increase right LE strength to 4/5    Status  Achieved      PT LONG TERM GOAL #5   Title  decrease right LE pain 50%    Status  Achieved            Plan - 03/15/18 1004    Clinical Impression Statement  Pt tolerated all of today's activities well, she is pleased wit her new ankle braces. She has met all goals. Increase time needed with outdoor ambulation. She fatigues quick with standing marches    Rehab Potential  Good    PT Frequency  2x / week    PT Duration  8 weeks    PT Treatment/Interventions  ADLs/Self Care Home Management;Cryotherapy;Electrical Stimulation;Iontophoresis 45m/ml Dexamethasone;Gait training;Neuromuscular re-education;Balance  training;Therapeutic exercise;Therapeutic activities;Functional mobility training;Stair training;Patient/family education;Manual techniques    PT Next Visit Plan  D/C PT       Patient will benefit from skilled therapeutic intervention in order to improve the following deficits and impairments:  Abnormal gait, Decreased range of motion, Difficulty walking, Decreased activity tolerance, Pain, Impaired flexibility, Decreased mobility, Decreased strength  Visit Diagnosis: Muscle weakness (generalized)  Difficulty in walking, not elsewhere classified  Pain in right hip  Repeated falls     Problem List Patient Active Problem List   Diagnosis Date Noted  . Essential hypertension, benign 09/03/2014  . Morbid obesity ([place] 03/06/2014  . HTN (hypertension) 03/06/2014  . Vertigo 03/06/2014  . Type II or unspecified type diabetes mellitus without mention of complication, uncontrolled 03/06/2014  . Pure hypercholesterolemia 03/06/2014   PHYSICAL THERAPY DISCHARGE SUMMARY  Visits from Start of Care: 17 Plan: Patient agrees to discharge.  Patient goals were met. Patient is being discharged due to meeting the stated rehab goals.  ?????      RScot Jun PTA 03/15/2018, 10:06 AM  [facility] [place] [address]Phone: [phone]  Fax:  [phone] Name: Jessica POLITTEMRN: [id number] of Birth: 8Feb 10, 1947

## 2018-03-17 ENCOUNTER — Ambulatory Visit: Payer: Medicare Other | Admitting: Physical Therapy

## 2018-03-22 ENCOUNTER — Ambulatory Visit: Payer: Medicare Other | Admitting: Physical Therapy

## 2018-03-22 DIAGNOSIS — M67912 Unspecified disorder of synovium and tendon, left shoulder: Secondary | ICD-10-CM | POA: Diagnosis not present

## 2018-03-22 DIAGNOSIS — M25511 Pain in right shoulder: Secondary | ICD-10-CM | POA: Diagnosis not present

## 2018-03-24 ENCOUNTER — Ambulatory Visit: Payer: Medicare Other | Admitting: Physical Therapy

## 2018-03-30 ENCOUNTER — Other Ambulatory Visit: Payer: Self-pay | Admitting: *Deleted

## 2018-03-30 MED ORDER — SPIRONOLACTONE 25 MG PO TABS: 25.0000 mg | ORAL_TABLET | Freq: Every day | ORAL | 0 refills | Status: DC

## 2018-04-12 DIAGNOSIS — M25512 Pain in left shoulder: Secondary | ICD-10-CM | POA: Diagnosis not present

## 2018-04-20 DIAGNOSIS — L602 Onychogryphosis: Secondary | ICD-10-CM | POA: Diagnosis not present

## 2018-04-20 DIAGNOSIS — E1351 Other specified diabetes mellitus with diabetic peripheral angiopathy without gangrene: Secondary | ICD-10-CM | POA: Diagnosis not present

## 2018-05-20 ENCOUNTER — Other Ambulatory Visit: Payer: Self-pay | Admitting: Cardiology

## 2018-05-26 DIAGNOSIS — R102 Pelvic and perineal pain: Secondary | ICD-10-CM | POA: Diagnosis not present

## 2018-05-26 DIAGNOSIS — R310 Gross hematuria: Secondary | ICD-10-CM | POA: Diagnosis not present

## 2018-05-30 DIAGNOSIS — E669 Obesity, unspecified: Secondary | ICD-10-CM | POA: Diagnosis not present

## 2018-05-30 DIAGNOSIS — E7800 Pure hypercholesterolemia, unspecified: Secondary | ICD-10-CM | POA: Diagnosis not present

## 2018-05-30 DIAGNOSIS — Z9641 Presence of insulin pump (external) (internal): Secondary | ICD-10-CM | POA: Diagnosis not present

## 2018-05-30 DIAGNOSIS — E1165 Type 2 diabetes mellitus with hyperglycemia: Secondary | ICD-10-CM | POA: Diagnosis not present

## 2018-05-30 DIAGNOSIS — G609 Hereditary and idiopathic neuropathy, unspecified: Secondary | ICD-10-CM | POA: Diagnosis not present

## 2018-05-30 DIAGNOSIS — I10 Essential (primary) hypertension: Secondary | ICD-10-CM | POA: Diagnosis not present

## 2018-06-09 DIAGNOSIS — R1031 Right lower quadrant pain: Secondary | ICD-10-CM | POA: Diagnosis not present

## 2018-06-12 DIAGNOSIS — E1165 Type 2 diabetes mellitus with hyperglycemia: Secondary | ICD-10-CM | POA: Diagnosis not present

## 2018-06-12 DIAGNOSIS — H40033 Anatomical narrow angle, bilateral: Secondary | ICD-10-CM | POA: Diagnosis not present

## 2018-06-12 DIAGNOSIS — H401222 Low-tension glaucoma, left eye, moderate stage: Secondary | ICD-10-CM | POA: Diagnosis not present

## 2018-06-12 DIAGNOSIS — H401211 Low-tension glaucoma, right eye, mild stage: Secondary | ICD-10-CM | POA: Diagnosis not present

## 2018-06-13 ENCOUNTER — Other Ambulatory Visit: Payer: Self-pay

## 2018-06-13 ENCOUNTER — Ambulatory Visit: Payer: Medicare Other | Attending: Family Medicine | Admitting: Physical Therapy

## 2018-06-13 DIAGNOSIS — G8929 Other chronic pain: Secondary | ICD-10-CM | POA: Insufficient documentation

## 2018-06-13 DIAGNOSIS — M25512 Pain in left shoulder: Secondary | ICD-10-CM | POA: Insufficient documentation

## 2018-06-13 DIAGNOSIS — M25612 Stiffness of left shoulder, not elsewhere classified: Secondary | ICD-10-CM | POA: Insufficient documentation

## 2018-06-13 DIAGNOSIS — M6281 Muscle weakness (generalized): Secondary | ICD-10-CM | POA: Diagnosis not present

## 2018-06-13 NOTE — Therapy (Signed)
[facility] [place], [place], [address] Phone: [phone]   Fax:  [phone]  Physical Therapy Evaluation  Patient Details  Name: Jessica [name] MRN: [id number] Date of Birth: 11/12/1945 Referring Provider: Frederik Pear MD   Encounter Date: 06/13/2018  PT End of Session - 06/13/18 0953    Visit Number  1    Date for PT Re-Evaluation  07/25/18    PT Start Time  0942    PT Stop Time  1029    PT Time Calculation (min)  47 min    Activity Tolerance  Patient tolerated treatment well    Behavior During Therapy  [facility] for tasks assessed/performed       Past Medical History:  Diagnosis Date  . Anemia   . Anxiety   . Arthritis   . Cataract   . Depression   . Diabetes mellitus without complication ([place])   . Diastolic dysfunction   . Frozen shoulder   . Glaucoma   . Headache    History of migraines  . Heart murmur   . History of kidney stones   . Hyperlipidemia   . Hypertension   . Macular degeneration   . Neuropathy   . Obesity   . Sleep apnea   . Stroke [facility])    has been told by a Dr. Mort Sawyers that she may have had a mini stroke but no further testing was done to confirm  . Tachycardia     Past Surgical History:  Procedure Laterality Date  . CARDIAC CATHETERIZATION     -6/09-normal ejection fraction 65%, no angiographically significant CAD., M. Skains MD  . CESAREAN SECTION    . COLONOSCOPY    . foot spurs removed    . HYSTEROSCOPY W/D&C N/A 06/28/2017   Procedure: DILATATION AND CURETTAGE /HYSTEROSCOPY/POLYPECTOMY WITH MYOSURE;  Surgeon: Christophe Louis, MD;  Location: [facility];  Service: Gynecology;  Laterality: N/A;  . JOINT REPLACEMENT     right knee replaced  . knee spurs removed    . shoulder spurs removed    . TONSILLECTOMY AND ADENOIDECTOMY    . UPPER GI ENDOSCOPY      There were no vitals filed for this visit.   Subjective Assessment - 06/13/18 0953    Subjective  Patient has  long history of left shoulder pain. She had an injection 04/12/18 which helped the pain but then she over did it and now has pain with ROM and lifting. Cares for her father and it hurts pushing him in his w/c.    Pertinent History  left shoulder manipulation 20  years ago, R TKR, anxiety/depression, DM    Diagnostic tests  xrays bone spurs    Patient Stated Goals  decrease pain, improve use of left arm    Currently in Pain?  Yes    Pain Score  5     Pain Location  Shoulder    Pain Orientation  Left    Pain Descriptors / Indicators  Aching    Pain Type  Chronic pain    Pain Onset  More than a month ago    Pain Frequency  Intermittent    Aggravating Factors   lifting and raising her arm; sleeping    Pain Relieving Factors  rest, tylenol    Effect of Pain on Daily Activities  difficulty caring for dad, performing OH activities and sleeping         [facility] PT Assessment -  06/13/18 0001      Assessment   Medical Diagnosis  left shoulder pain    Referring Provider  Frederik Pear MD    Onset Date/Surgical Date  05/18/18      Precautions   Precautions  None      Balance Screen   Has the patient fallen in the past 6 months  Yes   slipped in kitchen   How many times?  1    Has the patient had a decrease in activity level because of a fear of falling?   No    Is the patient reluctant to leave their home because of a fear of falling?   No      Home Environment   Living Environment  Private residence    Additional Comments  has a ramp, does housework, cooks, cares for her 78 year old dad      Prior Function   Level of Independence  Independent with household mobility with device    Vocation  Retired      Observation/Other Assessments   Focus on Therapeutic Outcomes (FOTO)   46% limited      ROM / Strength   AROM / PROM / Strength  AROM;PROM;Strength      AROM   Overall AROM Comments  measured in sitting    AROM Assessment Site  Shoulder    Right/Left Shoulder  Left    Left  Shoulder Extension  --   full   Left Shoulder Flexion  88 Degrees    Left Shoulder ABduction  112 Degrees    Left Shoulder Internal Rotation  22 Degrees   in sitting but able to get behind her back   Left Shoulder External Rotation  44 Degrees      PROM   Overall PROM Comments  measured in sitting    PROM Assessment Site  Shoulder    Right/Left Shoulder  Left    Left Shoulder Flexion  140 Degrees    Left Shoulder ABduction  140 Degrees    Left Shoulder External Rotation  56 Degrees      Strength   Strength Assessment Site  Shoulder    Right/Left Shoulder  Left    Left Shoulder Flexion  4-/5    Left Shoulder Extension  4+/5    Left Shoulder ABduction  4/5    Left Shoulder Internal Rotation  5/5    Left Shoulder External Rotation  4-/5      Palpation   Palpation comment  supraspinatus marked tenderness; tender in subscap and infraspinatus      Special Tests   Other special tests  negative HK, impringement sign                Objective measurements completed on examination: See above findings.      [facility] Adult PT Treatment/Exercise - 06/13/18 0001      Modalities   Modalities  Electrical Stimulation      Electrical Stimulation   Electrical Stimulation Location  L shoulder IFC 80-150 Hz x 15 min    Electrical Stimulation Goals  Pain             PT Education - 06/13/18 1123    Education provided  Yes    Education Details  wall slides flex/scaption    Person(s) Educated  Patient    Methods  Demonstration;Explanation    Comprehension  Verbalized understanding;Returned demonstration       PT Short Term Goals - 06/13/18 1130  PT SHORT TERM GOAL #1   Title  independent with intial HEP    Time  2    Period  Weeks    Status  New    Target Date  06/27/18        PT Long Term Goals - 06/13/18 1130      PT LONG TERM GOAL #1   Title  Improved Lt shoulder flexion to 155 degrees or better to improve function.    Time  6    Period  Weeks     Status  New      PT LONG TERM GOAL #2   Title  able to peform ADLs with 2/10 pain or less in left shoulder.    Time  6    Period  Weeks    Status  New      PT LONG TERM GOAL #3   Title  able to sleep without waking from left shoulder pain.    Time  6    Period  Weeks    Status  New      PT LONG TERM GOAL #4   Title  increase right shoulder strength to 4+/5 or better to improve function    Baseline  ----------------------------    Time  6    Period  Weeks    Status  New      PT LONG TERM GOAL #5   Title  improved Left shoulder ER to allow patient to easily reach behind head.    Time  6    Period  Weeks    Status  New             Plan - 06/13/18 1123    Clinical Impression Statement  Patient presents for low complexity evaluation for L shoulder pain. She ambulates with a RW and therefore walks with rounded shoulders. She received an injection in June and then overdid it in July resulting in decreased ROM and pain with pushing and OH use. She also has decreased strength in her L shoulder. Patient reports difficulty sleeping and caring for her father.    History and Personal Factors relevant to plan of care:  DM, anxiety/depression, heart issues    Clinical Presentation  Stable    Clinical Decision Making  Low    Rehab Potential  Good    PT Frequency  2x / week    PT Duration  6 weeks    PT Treatment/Interventions  ADLs/Self Care Home Management;Cryotherapy;Electrical Stimulation;Iontophoresis 4mg /ml Dexamethasone;Therapeutic exercise;Patient/family education;Manual techniques;Moist Heat;Ultrasound;Neuromuscular re-education;Passive range of motion;Dry needling;Taping    PT Next Visit Plan  ROM, strengthening L shoulder, modalities for pain including ionto if needed.    PT Home Exercise Plan  wall slides flex/scaption    Consulted and Agree with Plan of Care  Patient       Patient will benefit from skilled therapeutic intervention in order to improve the following  deficits and impairments:  Decreased range of motion, Pain, Decreased strength, Postural dysfunction, Impaired UE functional use  Visit Diagnosis: Chronic left shoulder pain - Plan: PT plan of care cert/re-cert  Muscle weakness (generalized) - Plan: PT plan of care cert/re-cert  Stiffness of left shoulder joint - Plan: PT plan of care cert/re-cert     Problem List Patient Active Problem List   Diagnosis Date Noted  . Essential hypertension, benign 09/03/2014  . Morbid obesity ([place]) 03/06/2014  . HTN (hypertension) 03/06/2014  . Vertigo 03/06/2014  . Type II or unspecified type diabetes mellitus  without mention of complication, uncontrolled 03/06/2014  . Pure hypercholesterolemia 03/06/2014    Senora Lacson PT 06/13/2018, 11:47 AM  [place], [place], [address] Phone: [phone]   Fax:  [phone]  Name: Jessica [name] MRN: [id number] Date of Birth: 1946/10/13

## 2018-06-15 ENCOUNTER — Encounter: Payer: Self-pay | Admitting: Physical Therapy

## 2018-06-15 ENCOUNTER — Ambulatory Visit: Payer: Medicare Other | Admitting: Physical Therapy

## 2018-06-15 DIAGNOSIS — M25612 Stiffness of left shoulder, not elsewhere classified: Secondary | ICD-10-CM

## 2018-06-15 DIAGNOSIS — M25512 Pain in left shoulder: Secondary | ICD-10-CM | POA: Diagnosis not present

## 2018-06-15 DIAGNOSIS — M6281 Muscle weakness (generalized): Secondary | ICD-10-CM

## 2018-06-15 DIAGNOSIS — G8929 Other chronic pain: Secondary | ICD-10-CM | POA: Diagnosis not present

## 2018-06-15 NOTE — Therapy (Signed)
[place], [place], [address] Phone: [phone]   Fax:  [phone]  Physical Therapy Treatment  Patient Details  Name: Jessica [name] MRN: [id number] Date of Birth: 1946/03/06 Referring Provider: Frederik Pear MD   Encounter Date: 06/15/2018  PT End of Session - 06/15/18 1052    Visit Number  2    Date for PT Re-Evaluation  07/25/18    PT Start Time  6160    PT Stop Time  1110    PT Time Calculation (min)  55 min    Activity Tolerance  Patient tolerated treatment well    Behavior During Therapy  [facility] for tasks assessed/performed       Past Medical History:  Diagnosis Date  . Anemia   . Anxiety   . Arthritis   . Cataract   . Depression   . Diabetes mellitus without complication ([place])   . Diastolic dysfunction   . Frozen shoulder   . Glaucoma   . Headache    History of migraines  . Heart murmur   . History of kidney stones   . Hyperlipidemia   . Hypertension   . Macular degeneration   . Neuropathy   . Obesity   . Sleep apnea   . Stroke [facility])    has been told by a Dr. Mort Sawyers that she may have had a mini stroke but no further testing was done to confirm  . Tachycardia     Past Surgical History:  Procedure Laterality Date  . CARDIAC CATHETERIZATION     -6/09-normal ejection fraction 65%, no angiographically significant CAD., M. Skains MD  . CESAREAN SECTION    . COLONOSCOPY    . foot spurs removed    . HYSTEROSCOPY W/D&C N/A 06/28/2017   Procedure: DILATATION AND CURETTAGE /HYSTEROSCOPY/POLYPECTOMY WITH MYOSURE;  Surgeon: Christophe Louis, MD;  Location: [facility];  Service: Gynecology;  Laterality: N/A;  . JOINT REPLACEMENT     right knee replaced  . knee spurs removed    . shoulder spurs removed    . TONSILLECTOMY AND ADENOIDECTOMY    . UPPER GI ENDOSCOPY      There were no vitals filed for this visit.  Subjective Assessment - 06/15/18 1017    Subjective  Pt reports that  her L hands cramps when using it, otherwise no change since evaluation    Currently in Pain?  Yes    Pain Score  5     Pain Location  Shoulder    Pain Orientation  Left                       [facility] Adult PT Treatment/Exercise - 06/15/18 0001      Exercises   Exercises  Shoulder      Shoulder Exercises: Seated   Extension  Theraband;20 reps;Both;Strengthening    Theraband Level (Shoulder Extension)  Level 2 (Red)    Row  Theraband;15 reps;Both;Strengthening   x2   Theraband Level (Shoulder Row)  Level 2 (Red)    External Rotation  Theraband;20 reps;Left    Theraband Level (Shoulder External Rotation)  Level 1 (Yellow)    Internal Rotation  Theraband;20 reps;Left    Theraband Level (Shoulder Internal Rotation)  Level 1 (Yellow)    Flexion  20 reps;Weights;Both;Strengthening    Flexion Weight (lbs)  1    Abduction  15 reps;AROM;Both      Shoulder Exercises: ROM/Strengthening  UBE (Upper Arm Bike)  L2 3 min each way       Modalities   Modalities  Electrical Stimulation      Electrical Stimulation   Electrical Stimulation Location  L shoulder IFC 80-150 Hz x 15 min    Electrical Stimulation Goals  Pain      Manual Therapy   Manual Therapy  Passive ROM    Manual therapy comments  in sitting    Passive ROM  Lshoulder all directions                PT Short Term Goals - 06/15/18 1055      PT SHORT TERM GOAL #1   Title  independent with intial HEP    Status  Achieved        PT Long Term Goals - 06/13/18 1130      PT LONG TERM GOAL #1   Title  Improved Lt shoulder flexion to 155 degrees or better to improve function.    Time  6    Period  Weeks    Status  New      PT LONG TERM GOAL #2   Title  able to peform ADLs with 2/10 pain or less in left shoulder.    Time  6    Period  Weeks    Status  New      PT LONG TERM GOAL #3   Title  able to sleep without waking from left shoulder pain.    Time  6    Period  Weeks    Status  New      PT  LONG TERM GOAL #4   Title  increase right shoulder strength to 4+/5 or better to improve function    Baseline  ----------------------------    Time  6    Period  Weeks    Status  New      PT LONG TERM GOAL #5   Title  improved Left shoulder ER to allow patient to easily reach behind head.    Time  6    Period  Weeks    Status  New            Plan - 06/15/18 1053    Clinical Impression Statement  Pt tolerated an initial progression to exercise and MT well. She did have a pain initially with MT extending shoulder from being in end range flexion, but the eased off at MT progressed. Tactile cues to keep L arm in proper placement with resisted ER/IR.    Rehab Potential  Good    PT Frequency  2x / week    PT Duration  6 weeks    PT Treatment/Interventions  ADLs/Self Care Home Management;Cryotherapy;Electrical Stimulation;Iontophoresis 4mg /ml Dexamethasone;Therapeutic exercise;Patient/family education;Manual techniques;Moist Heat;Ultrasound;Neuromuscular re-education;Passive range of motion;Dry needling;Taping    PT Next Visit Plan  ROM, strengthening L shoulder, modalities for pain including ionto if needed.       Patient will benefit from skilled therapeutic intervention in order to improve the following deficits and impairments:  Decreased range of motion, Pain, Decreased strength, Postural dysfunction, Impaired UE functional use  Visit Diagnosis: Muscle weakness (generalized)  Chronic left shoulder pain  Stiffness of left shoulder joint     Problem List Patient Active Problem List   Diagnosis Date Noted  . Essential hypertension, benign 09/03/2014  . Morbid obesity ([place]) 03/06/2014  . HTN (hypertension) 03/06/2014  . Vertigo 03/06/2014  . Type II or unspecified type diabetes mellitus without mention of  complication, uncontrolled 03/06/2014  . Pure hypercholesterolemia 03/06/2014    Scot Jun, PTA 06/15/2018, 10:55 AM  [place], [place], [address] Phone: [phone]   Fax:  [phone]  Name: Jessica [name] MRN: [id number] Date of Birth: Mar 30, 1946

## 2018-06-18 ENCOUNTER — Other Ambulatory Visit: Payer: Self-pay | Admitting: Cardiology

## 2018-06-20 ENCOUNTER — Ambulatory Visit: Payer: Medicare Other | Admitting: Physical Therapy

## 2018-06-20 DIAGNOSIS — M6281 Muscle weakness (generalized): Secondary | ICD-10-CM | POA: Diagnosis not present

## 2018-06-20 DIAGNOSIS — G8929 Other chronic pain: Secondary | ICD-10-CM

## 2018-06-20 DIAGNOSIS — M25512 Pain in left shoulder: Secondary | ICD-10-CM | POA: Diagnosis not present

## 2018-06-20 DIAGNOSIS — M25612 Stiffness of left shoulder, not elsewhere classified: Secondary | ICD-10-CM | POA: Diagnosis not present

## 2018-06-20 NOTE — Therapy (Signed)
[place], [place], [address] Phone: [phone]   Fax:  [phone]  Physical Therapy Treatment  Patient Details  Name: Jessica [name] MRN: [id number] Date of Birth: 1945/12/19 Referring Provider: Frederik Pear MD   Encounter Date: 06/20/2018  PT End of Session - 06/20/18 1025    Visit Number  3    Date for PT Re-Evaluation  07/25/18    PT Start Time  1020    PT Stop Time  1059    PT Time Calculation (min)  39 min    Activity Tolerance  Patient tolerated treatment well    Behavior During Therapy  [facility] for tasks assessed/performed       Past Medical History:  Diagnosis Date  . Anemia   . Anxiety   . Arthritis   . Cataract   . Depression   . Diabetes mellitus without complication ([place])   . Diastolic dysfunction   . Frozen shoulder   . Glaucoma   . Headache    History of migraines  . Heart murmur   . History of kidney stones   . Hyperlipidemia   . Hypertension   . Macular degeneration   . Neuropathy   . Obesity   . Sleep apnea   . Stroke [facility])    has been told by a Dr. Mort Sawyers that she may have had a mini stroke but no further testing was done to confirm  . Tachycardia     Past Surgical History:  Procedure Laterality Date  . CARDIAC CATHETERIZATION     -6/09-normal ejection fraction 65%, no angiographically significant CAD., M. Skains MD  . CESAREAN SECTION    . COLONOSCOPY    . foot spurs removed    . HYSTEROSCOPY W/D&C N/A 06/28/2017   Procedure: DILATATION AND CURETTAGE /HYSTEROSCOPY/POLYPECTOMY WITH MYOSURE;  Surgeon: Christophe Louis, MD;  Location: [facility];  Service: Gynecology;  Laterality: N/A;  . JOINT REPLACEMENT     right knee replaced  . knee spurs removed    . shoulder spurs removed    . TONSILLECTOMY AND ADENOIDECTOMY    . UPPER GI ENDOSCOPY      There were no vitals filed for this visit.  Subjective Assessment - 06/20/18 1029    Subjective  Patient reports  limited sleep in the past few days as her father fell and she had to call EMTs in the night.    Pertinent History  left shoulder manipulation 20  years ago, R TKR, anxiety/depression, DM    Limitations  Lifting;Walking;Standing;House hold activities    Diagnostic tests  xrays bone spurs    Patient Stated Goals  decrease pain, improve use of left arm    Currently in Pain?  Yes    Pain Score  5     Pain Location  Shoulder    Pain Orientation  Left    Pain Descriptors / Indicators  Aching    Pain Type  Chronic pain                       [facility] Adult PT Treatment/Exercise - 06/20/18 0001      Exercises   Exercises  Shoulder      Shoulder Exercises: Seated   Extension  Theraband;20 reps;Both;Strengthening    Theraband Level (Shoulder Extension)  Level 2 (Red)    Row  Theraband;15 reps;Both;Strengthening   x2   Theraband Level (Shoulder Row)  Level 2 (Red)  External Rotation  Theraband;20 reps;Left    Theraband Level (Shoulder External Rotation)  Level 1 (Yellow)    Internal Rotation  Theraband;20 reps;Left    Theraband Level (Shoulder Internal Rotation)  Level 1 (Yellow)    Other Seated Exercises  robber bil x 20    Other Seated Exercises  upright rows 3# on R, 2# on L x 20      Shoulder Exercises: Sidelying   External Rotation  Strengthening;Left;20 reps   VC/TC to keep elbow at 90 deg   Flexion  Strengthening;Left;20 reps;Weights    Flexion Weight (lbs)  1    Other Sidelying Exercises  empty can x 20      Shoulder Exercises: Stretch   Corner Stretch  2 reps;30 seconds      Modalities   Modalities  Iontophoresis      Iontophoresis   Type of Iontophoresis  Dexamethasone    Location  L supraspinatus    Dose  34mAmin    Time  4 hour patch             PT Education - 06/20/18 1200    Education provided  Yes    Education Details  educated on ionto precautions    Person(s) Educated  Patient    Methods  Explanation;Demonstration    Comprehension   Verbalized understanding       PT Short Term Goals - 06/15/18 1055      PT SHORT TERM GOAL #1   Title  independent with intial HEP    Status  Achieved        PT Long Term Goals - 06/13/18 1130      PT LONG TERM GOAL #1   Title  Improved Lt shoulder flexion to 155 degrees or better to improve function.    Time  6    Period  Weeks    Status  New      PT LONG TERM GOAL #2   Title  able to peform ADLs with 2/10 pain or less in left shoulder.    Time  6    Period  Weeks    Status  New      PT LONG TERM GOAL #3   Title  able to sleep without waking from left shoulder pain.    Time  6    Period  Weeks    Status  New      PT LONG TERM GOAL #4   Title  increase right shoulder strength to 4+/5 or better to improve function    Baseline  ----------------------------    Time  6    Period  Weeks    Status  New      PT LONG TERM GOAL #5   Title  improved Left shoulder ER to allow patient to easily reach behind head.    Time  6    Period  Weeks    Status  New            Plan - 06/20/18 1157    Clinical Impression Statement  Patient did well with TE today. She had difficulty with seated flexion today, so moved to [facility] which was much better form and not painful. Trial 1/6 of ionto to R supraspinatus.    PT Treatment/Interventions  ADLs/Self Care Home Management;Cryotherapy;Electrical Stimulation;Iontophoresis 4mg /ml Dexamethasone;Therapeutic exercise;Patient/family education;Manual techniques;Moist Heat;Ultrasound;Neuromuscular re-education;Passive range of motion;Dry needling;Taping    PT Next Visit Plan  Assess ionto; ROM, strengthening L shoulder       Patient  will benefit from skilled therapeutic intervention in order to improve the following deficits and impairments:  Decreased range of motion, Pain, Decreased strength, Postural dysfunction, Impaired UE functional use  Visit Diagnosis: Chronic left shoulder pain  Muscle weakness (generalized)     Problem  List Patient Active Problem List   Diagnosis Date Noted  . Essential hypertension, benign 09/03/2014  . Morbid obesity ([place]) 03/06/2014  . HTN (hypertension) 03/06/2014  . Vertigo 03/06/2014  . Type II or unspecified type diabetes mellitus without mention of complication, uncontrolled 03/06/2014  . Pure hypercholesterolemia 03/06/2014    Tammie Yanda PT 06/20/2018, 12:01 PM  [place], [place], [address] Phone: [phone]   Fax:  [phone]  Name: NYLENE INLOW MRN: [id number] Date of Birth: 1946-10-22

## 2018-06-22 ENCOUNTER — Ambulatory Visit: Payer: Medicare Other | Admitting: Physical Therapy

## 2018-06-22 DIAGNOSIS — M25512 Pain in left shoulder: Secondary | ICD-10-CM | POA: Diagnosis not present

## 2018-06-22 DIAGNOSIS — G8929 Other chronic pain: Secondary | ICD-10-CM | POA: Diagnosis not present

## 2018-06-22 DIAGNOSIS — M25612 Stiffness of left shoulder, not elsewhere classified: Secondary | ICD-10-CM | POA: Diagnosis not present

## 2018-06-22 DIAGNOSIS — M6281 Muscle weakness (generalized): Secondary | ICD-10-CM | POA: Diagnosis not present

## 2018-06-22 NOTE — Therapy (Addendum)
[place], [place], [address] Phone: [phone]   Fax:  [phone]  Physical Therapy Treatment  Patient Details  Name: Jessica [name] MRN: [id number] Date of Birth: 16-Nov-1945 Referring Provider: Frederik Pear MD   Encounter Date: 06/22/2018  PT End of Session - 06/22/18 1024    Visit Number  4    Date for PT Re-Evaluation  07/25/18    Authorization Type  KX    PT Start Time  1019    PT Stop Time  1100    PT Time Calculation (min)  41 min    Activity Tolerance  Patient tolerated treatment well    Behavior During Therapy  [facility] for tasks assessed/performed       Past Medical History:  Diagnosis Date  . Anemia   . Anxiety   . Arthritis   . Cataract   . Depression   . Diabetes mellitus without complication ([place])   . Diastolic dysfunction   . Frozen shoulder   . Glaucoma   . Headache    History of migraines  . Heart murmur   . History of kidney stones   . Hyperlipidemia   . Hypertension   . Macular degeneration   . Neuropathy   . Obesity   . Sleep apnea   . Stroke [facility])    has been told by a Dr. Mort Sawyers that she may have had a mini stroke but no further testing was done to confirm  . Tachycardia     Past Surgical History:  Procedure Laterality Date  . CARDIAC CATHETERIZATION     -6/09-normal ejection fraction 65%, no angiographically significant CAD., M. Skains MD  . CESAREAN SECTION    . COLONOSCOPY    . foot spurs removed    . HYSTEROSCOPY W/D&C N/A 06/28/2017   Procedure: DILATATION AND CURETTAGE /HYSTEROSCOPY/POLYPECTOMY WITH MYOSURE;  Surgeon: Christophe Louis, MD;  Location: [facility];  Service: Gynecology;  Laterality: N/A;  . JOINT REPLACEMENT     right knee replaced  . knee spurs removed    . shoulder spurs removed    . TONSILLECTOMY AND ADENOIDECTOMY    . UPPER GI ENDOSCOPY      There were no vitals filed for this visit.  Subjective Assessment - 06/22/18 1025    Subjective  Patient states her shoulder is 5/10 today.    Pertinent History  left shoulder manipulation 20  years ago, R TKR, anxiety/depression, DM    Limitations  Lifting;Walking;Standing;House hold activities    Diagnostic tests  xrays bone spurs    Patient Stated Goals  decrease pain, improve use of left arm    Currently in Pain?  Yes    Pain Score  5     Pain Location  Shoulder    Pain Orientation  Left    Pain Descriptors / Indicators  Aching    Pain Type  Chronic pain                       [facility] Adult PT Treatment/Exercise - 06/22/18 0001      Exercises   Exercises  Shoulder      Shoulder Exercises: Seated   Extension  Strengthening;Both;10 reps;20 reps;Theraband    Theraband Level (Shoulder Extension)  Level 2 (Red)    Row  Strengthening;Both;10 reps;20 reps;Theraband    Theraband Level (Shoulder Row)  Level 2 (Red)    External Rotation  10 reps;Limitations  too painful switched to isometrics   Internal Rotation  Theraband;20 reps;Left    Theraband Level (Shoulder Internal Rotation)  Level 1 (Yellow)      Shoulder Exercises: Sidelying   External Rotation  Strengthening;Left;20 reps;Weights   VC/TC to keep elbow at 90 deg   External Rotation Weight (lbs)  1    External Rotation Limitations  did after abduction    Flexion  Strengthening;Left;20 reps;Weights    Flexion Weight (lbs)  1    ABduction  Strengthening;Left;20 reps    ABduction Limitations  painfree range    Other Sidelying Exercises  empty can 1# x 20      Shoulder Exercises: ROM/Strengthening   UBE (Upper Arm Bike)  L2 x 6 min       Shoulder Exercises: Isometric Strengthening   External Rotation  5X10"         Modalities: iontophoresis dexamethasone 45mAmin 4 hour patch to L shoulder      PT Short Term Goals - 06/15/18 1055      PT SHORT TERM GOAL #1   Title  independent with intial HEP    Status  Achieved        PT Long Term Goals - 06/22/18 1108      PT LONG TERM  GOAL #2   Title  able to peform ADLs with 2/10 pain or less in left shoulder.    Time  6    Period  Weeks    Status  On-going      PT LONG TERM GOAL #3   Title  able to sleep without waking from left shoulder pain.    Time  6    Period  Weeks    Status  On-going            Plan - 06/22/18 1105    Clinical Impression Statement  Patient continues to have pain mainly with ER, but tolerates SDLY strengthening.     Rehab Potential  Good    PT Frequency  2x / week    PT Duration  6 weeks    PT Treatment/Interventions  ADLs/Self Care Home Management;Cryotherapy;Electrical Stimulation;Iontophoresis 4mg /ml Dexamethasone;Therapeutic exercise;Patient/family education;Manual techniques;Moist Heat;Ultrasound;Neuromuscular re-education;Passive range of motion;Dry needling;Taping     PT next visit plan: continue SDLY strengthening  Patient will benefit from skilled therapeutic intervention in order to improve the following deficits and impairments:  Decreased range of motion, Pain, Decreased strength, Postural dysfunction, Impaired UE functional use  Visit Diagnosis: Chronic left shoulder pain  Muscle weakness (generalized)     Problem List Patient Active Problem List   Diagnosis Date Noted  . Essential hypertension, benign 09/03/2014  . Morbid obesity ([place]) 03/06/2014  . HTN (hypertension) 03/06/2014  . Vertigo 03/06/2014  . Type II or unspecified type diabetes mellitus without mention of complication, uncontrolled 03/06/2014  . Pure hypercholesterolemia 03/06/2014    Bernadett Milian PT  06/22/2018, 12:08 PM  [place], [place], [address] Phone: [phone]   Fax:  [phone]  Name: Jessica [name] MRN: [id number] Date of Birth: [birth year]

## 2018-06-27 ENCOUNTER — Encounter: Payer: Self-pay | Admitting: Cardiology

## 2018-06-27 ENCOUNTER — Ambulatory Visit (INDEPENDENT_AMBULATORY_CARE_PROVIDER_SITE_OTHER): Payer: Medicare Other | Admitting: Cardiology

## 2018-06-27 VITALS — BP 130/60 | HR 96 | Ht <= 58 in | Wt 241.4 lb

## 2018-06-27 DIAGNOSIS — I10 Essential (primary) hypertension: Secondary | ICD-10-CM | POA: Diagnosis not present

## 2018-06-27 DIAGNOSIS — E7800 Pure hypercholesterolemia, unspecified: Secondary | ICD-10-CM

## 2018-06-27 MED ORDER — FUROSEMIDE 20 MG PO TABS: 20.0000 mg | ORAL_TABLET | Freq: Every day | ORAL | 3 refills | Status: DC

## 2018-06-27 NOTE — Patient Instructions (Signed)
Medication Instructions:  Please discontinue your aspirin. Decrease Furosemide to 20 mg a day. Continue all other medications as listed.  Follow-Up: Follow up in 1 year with Dr. Marlou Porch.  You will receive a letter in the mail 2 months before you are due.  Please call us when you receive this letter to schedule your follow up appointment.  If you need a refill on your cardiac medications before your next appointment, please call your pharmacy.  Thank you for choosing [facility]!!

## 2018-06-27 NOTE — Progress Notes (Signed)
[facility] [address], [address], [place]  [address] Phone: [phone] Fax:  [phone]  Date:  06/27/2018   ID:  Jessica [name], Jessica [name] [birth year], MRN [id number]  PCP:  Cari Caraway, MD   History of Present Illness: Jessica [name] is a 72 y.o. female with hx of obesity, hypertension, and hyperlipidemia. 02/23/13 here for follow up.   In 2010, cardiac catheterization showed no evidence of coronary artery disease. Normal ejection fraction  She has been going to outpatient rehabilitation for muscle weakness secondary to repeated falls. She is also seen nutrition for diabetes.  She has a GYN surgery planned by Dr. Renaee Munda. Dysfunctional uterine bleeding. She is not been experiencing any change in symptoms. No excessive shortness of breath, no chest pain except for occasional atypical muscle aches, no syncope, no fevers. She takes iron supplement and vitamin C for anemia. Her hemoglobin was 14.  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  Echocardiogram -EF 65-70%, no WMA, but increase LA pressures.   She uses CPAP nightly. During her sleep study by Dr. Constance Holster, she did have pauses she states.  Has see nutritionist as well  Prior vertigo. Cataract surgery. Tachycardia noted with ambulation.  Her father was featured in the life section of the news and record on 04/02/2016. 70 years old.  06/27/2018-since we last visited, she had her gynecologic surgery by Dr. Landry Mellow.  Overall doing well.  She is also been battling with hip pain.  Been undergoing physical therapy. Still taking care of 3 year of father.  Overall still having some left hand cramping, left flank cramping.  No significant change or shortness of breath.  Utilizes a walker for stability.  Wears bilateral ankle braces for stability and fluid as well.  She has been taking half the dose of Lasix.  Denies any chest pain.  No fevers chills nausea.  Wt Readings from Last 3 Encounters:  06/27/18 241 lb 6.4 oz  (109.5 kg)  06/23/17 242 lb (109.8 kg)  06/20/17 241 lb 4 oz (109.4 kg)     Past Medical History:  Diagnosis Date  . Anemia   . Anxiety   . Arthritis   . Cataract   . Depression   . Diabetes mellitus without complication ([place])   . Diastolic dysfunction   . Frozen shoulder   . Glaucoma   . Headache    History of migraines  . Heart murmur   . History of kidney stones   . Hyperlipidemia   . Hypertension   . Macular degeneration   . Neuropathy   . Obesity   . Sleep apnea   . Stroke [facility])    has been told by a Dr. Mort Sawyers that she may have had a mini stroke but no further testing was done to confirm  . Tachycardia     Past Surgical History:  Procedure Laterality Date  . CARDIAC CATHETERIZATION     -6/09-normal ejection fraction 65%, no angiographically significant CAD., M. Jeanetta Alonzo MD  . CESAREAN SECTION    . COLONOSCOPY    . foot spurs removed    . HYSTEROSCOPY W/D&C N/A 06/28/2017   Procedure: DILATATION AND CURETTAGE /HYSTEROSCOPY/POLYPECTOMY WITH MYOSURE;  Surgeon: Christophe Louis, MD;  Location: [facility];  Service: Gynecology;  Laterality: N/A;  . JOINT REPLACEMENT     right knee replaced  . knee spurs removed    . shoulder spurs removed    . TONSILLECTOMY AND ADENOIDECTOMY    .  UPPER GI ENDOSCOPY      Current Outpatient Medications  Medication Sig Dispense Refill  . acetaminophen (TYLENOL) 325 MG tablet Take 2 tablets (650 mg total) by mouth every 6 (six) hours as needed. Do not take more than 4000mg  of tylenol per day 30 tablet 0  . amoxicillin (AMOXIL) 500 MG capsule TAKE 4 TABLETS BY MOUTH 1 HOUR BEFORE APPOINTMENT  12  . B-D INS SYRINGE 0.5CC/31GX5/16 31G X 5/16" 0.5 ML MISC Use as directed per provider.    . Calcium Carbonate-Vitamin D 600-200 MG-UNIT TABS Take 1 tablet by mouth daily.     . clobetasol cream (TEMOVATE) 0.05 % APPLY TO AFFECTED AREAS 2 TIMES DAILY NOT TO FACE,GROIN, OR ARMPIT  3  . Coenzyme Q10 (CO Q-10) 100 MG CAPS Take 100 mg by mouth at bedtime.      . diclofenac sodium (VOLTAREN) 1 % GEL Apply 2 g topically 4 (four) times daily. 100 g 0  . dorzolamide-timolol (COSOPT) 22.3-6.8 MG/ML ophthalmic solution Place 1 drop into both eyes 2 (two) times daily.   4  . doxycycline (VIBRAMYCIN) 50 MG capsule 2 po daily 14 capsule 0  . fluticasone (FLONASE) 50 MCG/ACT nasal spray Place 1 spray into both nostrils daily as needed for allergies or rhinitis.    . furosemide (LASIX) 20 MG tablet Take 1 tablet (20 mg total) by mouth daily. 90 tablet 3  . Garlic 5284 MG CAPS Take 1,000 mg by mouth daily.     Marland Kitchen ibuprofen (ADVIL,MOTRIN) 600 MG tablet 1 po every 6 hours prn for mild to moderate pain 30 tablet 0  . insulin lispro (HUMALOG) 100 UNIT/ML injection Inject into the skin daily. Insulin pump    . INVOKANA 300 MG TABS tablet Take 300 mg by mouth daily.    . Iron-Vitamin C (VITRON-C) 65-125 MG TABS Take 1 tablet by mouth at bedtime.     Marland Kitchen losartan (COZAAR) 100 MG tablet Take 1 tablet (100 mg total) by mouth daily. 90 tablet 0  . metFORMIN (GLUCOPHAGE-XR) 500 MG 24 hr tablet Take 500 mg by mouth daily with supper.    . metoprolol tartrate (LOPRESSOR) 100 MG tablet Take 1 tablet (100 mg total) by mouth 2 (two) times daily. 180 tablet 0  . Multiple Vitamins-Minerals (CENTRUM SILVER PO) Take 1 tablet by mouth daily.     . Multiple Vitamins-Minerals (PRESERVISION AREDS 2 PO) Take 1 tablet by mouth 2 (two) times daily.    . ONE TOUCH ULTRA TEST test strip 1 each by Other route as needed (Use as directed per provider).   11  . Probiotic Product (PROBIOTIC DAILY PO) Take 1 capsule by mouth 2 (two) times daily.    . simvastatin (ZOCOR) 40 MG tablet Take 40 mg by mouth every evening.    Marland Kitchen spironolactone (ALDACTONE) 25 MG tablet Take 1 tablet (25 mg total) by mouth daily. 90 tablet 0  . traMADol (ULTRAM) 50 MG tablet Take 50 mg by mouth at bedtime as needed for moderate pain.    . Travoprost, BAK Free, (TRAVATAN) 0.004 % SOLN ophthalmic solution Place 1 drop into  both eyes at bedtime.     . Vitamin D, Ergocalciferol, 2000 units CAPS Take 2,000 Units by mouth daily.     No current facility-administered medications for this visit.     Allergies:    Allergies  Allergen Reactions  . Meloxicam     Having bleeding  . Percocet [Oxycodone-Acetaminophen] Other (See Comments)    Headache  .  Tramadol   . Vicodin [Hydrocodone-Acetaminophen] Other (See Comments)    Headache   . Azithromycin Rash  . Iodine Rash    Social History:  The patient  reports that she has never smoked. She has never used smokeless tobacco. She reports that she does not drink alcohol or use drugs.   ROS:  Please see the history of present illness.   Denies any fevers, chills, orthopnea, PND.   PHYSICAL EXAM: VS:  BP 130/60   Pulse 96   Ht 4\' 10"  (1.473 m)   Wt 241 lb 6.4 oz (109.5 kg)   LMP  (LMP Unknown)   SpO2 95%   BMI 50.45 kg/m  GEN: Well nourished, well developed, in no acute distress , obese HEENT: normal  Neck: no JVD, carotid bruits, or masses Cardiac: RRR; no murmurs, rubs, or gallops,no edema  Respiratory:  clear to auscultation bilaterally, normal work of breathing GI: soft, nontender, nondistended, + BS MS: no deformity or atrophy  Skin: warm and dry, no rash Neuro:  Alert and Oriented x 3, Strength and sensation are intact Psych: euthymic mood, full affect   EKG-06/27/2018-sinus rhythm 96 nonspecific ST-T wave changes no other abnormalities personally viewed-prior 06/20/17-sinus rhythm, left axis deviation, no other abnormalities personally viewed 11/26/15-sinus rhythm, 77, no other abnormalities personally viewed-prior 03/06/14-normal rhythm, 79, poor R wave progression, no other abnormalities.       ASSESSMENT AND PLAN:   Morbid obesity -Continues to work hard on weight loss.  She is in a competition with her sister she states.  Increase vegetables, decrease caloric intake.  Essential hypertension -Currently on multidrug regimen, well controlled.   She asked about coming back off of some of the medications.  She has been taking her Lasix, half dose most of the time.  Just continue to watch her weights and make sure she is not gaining any fluid weight.  I am okay with decreasing the Lasix to half dose.  I would also like for her to continue with the Aldactone as she has had problems in the past with hypokalemia.  She still experiences some cramping in her left flank and left hand at times.  Last potassium however was 4.7.  Chronic left-sided flank pain -Likely musculoskeletal.  Heart cath 2010 reassuring, no CAD.  Hematuria/uterine bleeding/fibroids -She is come off of her low-dose aspirin.  I am fine with this given her bleeding episodes and no issues with coronary artery disease.  Diabetes with hypertension - Continues to work with endocrinology, Dr. Chalmers Cater, has insulin pump.  Hyperlipidemia - Simvastatin, excellent, LDL 46.  Continue.   81-month follow-up.  Signed, Candee Furbish, MD [facility]  06/27/2018 10:38 AM

## 2018-06-28 ENCOUNTER — Ambulatory Visit: Payer: Medicare Other

## 2018-06-29 DIAGNOSIS — L602 Onychogryphosis: Secondary | ICD-10-CM | POA: Diagnosis not present

## 2018-06-29 DIAGNOSIS — E1351 Other specified diabetes mellitus with diabetic peripheral angiopathy without gangrene: Secondary | ICD-10-CM | POA: Diagnosis not present

## 2018-06-30 DIAGNOSIS — G629 Polyneuropathy, unspecified: Secondary | ICD-10-CM | POA: Diagnosis not present

## 2018-06-30 DIAGNOSIS — Z0000 Encounter for general adult medical examination without abnormal findings: Secondary | ICD-10-CM | POA: Diagnosis not present

## 2018-06-30 DIAGNOSIS — Z7984 Long term (current) use of oral hypoglycemic drugs: Secondary | ICD-10-CM | POA: Diagnosis not present

## 2018-06-30 DIAGNOSIS — E1140 Type 2 diabetes mellitus with diabetic neuropathy, unspecified: Secondary | ICD-10-CM | POA: Diagnosis not present

## 2018-06-30 DIAGNOSIS — G4733 Obstructive sleep apnea (adult) (pediatric): Secondary | ICD-10-CM | POA: Diagnosis not present

## 2018-06-30 DIAGNOSIS — Z1159 Encounter for screening for other viral diseases: Secondary | ICD-10-CM | POA: Diagnosis not present

## 2018-06-30 DIAGNOSIS — Z23 Encounter for immunization: Secondary | ICD-10-CM | POA: Diagnosis not present

## 2018-06-30 DIAGNOSIS — E782 Mixed hyperlipidemia: Secondary | ICD-10-CM | POA: Diagnosis not present

## 2018-06-30 DIAGNOSIS — R2689 Other abnormalities of gait and mobility: Secondary | ICD-10-CM | POA: Diagnosis not present

## 2018-06-30 DIAGNOSIS — R296 Repeated falls: Secondary | ICD-10-CM | POA: Diagnosis not present

## 2018-06-30 DIAGNOSIS — H4089 Other specified glaucoma: Secondary | ICD-10-CM | POA: Diagnosis not present

## 2018-06-30 DIAGNOSIS — I519 Heart disease, unspecified: Secondary | ICD-10-CM | POA: Diagnosis not present

## 2018-06-30 DIAGNOSIS — I10 Essential (primary) hypertension: Secondary | ICD-10-CM | POA: Diagnosis not present

## 2018-07-04 ENCOUNTER — Ambulatory Visit: Payer: Medicare Other | Admitting: Physical Therapy

## 2018-07-06 ENCOUNTER — Ambulatory Visit: Payer: Medicare Other | Attending: Family Medicine | Admitting: Physical Therapy

## 2018-07-06 DIAGNOSIS — G8929 Other chronic pain: Secondary | ICD-10-CM | POA: Insufficient documentation

## 2018-07-06 DIAGNOSIS — M6281 Muscle weakness (generalized): Secondary | ICD-10-CM | POA: Insufficient documentation

## 2018-07-06 DIAGNOSIS — M25512 Pain in left shoulder: Secondary | ICD-10-CM | POA: Diagnosis not present

## 2018-07-06 DIAGNOSIS — M25612 Stiffness of left shoulder, not elsewhere classified: Secondary | ICD-10-CM | POA: Diagnosis not present

## 2018-07-06 NOTE — Therapy (Signed)
[place], [place], [address] Phone: [phone]   Fax:  [phone]  Physical Therapy Treatment  Patient Details  Name: Jessica [name] MRN: [id number] Date of Birth: Mar 29, 1946 Referring Provider: Frederik Pear MD   Encounter Date: 07/06/2018  PT End of Session - 07/06/18 1019    Visit Number  5    Date for PT Re-Evaluation  07/25/18    Authorization Type  KX    PT Start Time  1018    PT Stop Time  1046    PT Time Calculation (min)  28 min    Activity Tolerance  Other (comment)   patient ill   Behavior During Therapy  [facility] for tasks assessed/performed       Past Medical History:  Diagnosis Date  . Anemia   . Anxiety   . Arthritis   . Cataract   . Depression   . Diabetes mellitus without complication ([place])   . Diastolic dysfunction   . Frozen shoulder   . Glaucoma   . Headache    History of migraines  . Heart murmur   . History of kidney stones   . Hyperlipidemia   . Hypertension   . Macular degeneration   . Neuropathy   . Obesity   . Sleep apnea   . Stroke [facility])    has been told by a Dr. Mort Sawyers that she may have had a mini stroke but no further testing was done to confirm  . Tachycardia     Past Surgical History:  Procedure Laterality Date  . CARDIAC CATHETERIZATION     -6/09-normal ejection fraction 65%, no angiographically significant CAD., M. Skains MD  . CESAREAN SECTION    . COLONOSCOPY    . foot spurs removed    . HYSTEROSCOPY W/D&C N/A 06/28/2017   Procedure: DILATATION AND CURETTAGE /HYSTEROSCOPY/POLYPECTOMY WITH MYOSURE;  Surgeon: Christophe Louis, MD;  Location: [facility];  Service: Gynecology;  Laterality: N/A;  . JOINT REPLACEMENT     right knee replaced  . knee spurs removed    . shoulder spurs removed    . TONSILLECTOMY AND ADENOIDECTOMY    . UPPER GI ENDOSCOPY      There were no vitals filed for this visit.  Subjective Assessment - 07/06/18 1023    Subjective  Patient reports 10/10 pain last night when she was sleeping. Today 2-3/10 in shoulder, increases with OH activity.    Pertinent History  left shoulder manipulation 20  years ago, R TKR, anxiety/depression, DM    Limitations  Lifting;Walking;Standing;House hold activities    Diagnostic tests  xrays bone spurs    Patient Stated Goals  decrease pain, improve use of left arm    Currently in Pain?  Yes    Pain Score  2     Pain Location  Shoulder    Pain Orientation  Left    Pain Descriptors / Indicators  Aching    Pain Type  Chronic pain         [facility] PT Assessment - 07/06/18 0001      Strength   Left Shoulder Flexion  4/5    Left Shoulder Extension  5/5    Left Shoulder ABduction  5/5   in available range   Left Shoulder Internal Rotation  5/5    Left Shoulder External Rotation  4+/5  [facility] Adult PT Treatment/Exercise - 07/06/18 0001      Shoulder Exercises: Sidelying   External Rotation  Strengthening;Left;20 reps;Weights   VC/TC to keep elbow at 90 deg   External Rotation Weight (lbs)  1    ABduction  Strengthening;Left;20 reps    ABduction Limitations  painfree range      Shoulder Exercises: ROM/Strengthening   UBE (Upper Arm Bike)  L2.5 x 6 min       Modalities   Modalities  Iontophoresis      Iontophoresis   Type of Iontophoresis  Dexamethasone    Location  L shoulder    Dose  7mmin    Time  4 hour patch               PT Short Term Goals - 06/15/18 1055      PT SHORT TERM GOAL #1   Title  independent with intial HEP    Status  Achieved        PT Long Term Goals - 07/06/18 1151      PT LONG TERM GOAL #1   Title  Improved Lt shoulder flexion to 155 degrees or better to improve function.    Time  6    Period  Weeks    Status  On-going      PT LONG TERM GOAL #2   Title  able to peform ADLs with 2/10 pain or less in left shoulder.    Time  6    Period  Weeks    Status  On-going      PT LONG TERM  GOAL #3   Title  able to sleep without waking from left shoulder pain.    Time  6    Period  Weeks    Status  On-going      PT LONG TERM GOAL #4   Title  increase left shoulder strength to 4+/5 or better to improve function    Time  6    Period  Weeks    Status  Partially Met   revised to left shoulder     PT LONG TERM GOAL #5   Title  improved Left shoulder ER to allow patient to easily reach behind head.    Time  6            Plan - 07/06/18 1055    Clinical Impression Statement  Patient had to leave early due to feeling sick. She has been sick the past few days, but felt better this morning so came to therapy. Her strength is improving, but pain still significant with OH activities and sleeping.    Rehab Potential  Good    PT Frequency  2x / week    PT Duration  6 weeks    PT Treatment/Interventions  ADLs/Self Care Home Management;Cryotherapy;Electrical Stimulation;Iontophoresis 474mml Dexamethasone;Therapeutic exercise;Patient/family education;Manual techniques;Moist Heat;Ultrasound;Neuromuscular re-education;Passive range of motion;Dry needling;Taping    PT Next Visit Plan  assess goals upon return; add SDLY ABD to HEP; continue painfree strengthening and ionto    PT Home Exercise Plan  wall slides flex/scaption, sidelying ER    Consulted and Agree with Plan of Care  Patient       Patient will benefit from skilled therapeutic intervention in order to improve the following deficits and impairments:  Decreased range of motion, Pain, Decreased strength, Postural dysfunction, Impaired UE functional use  Visit Diagnosis: Chronic left shoulder pain  Muscle weakness (generalized)  Stiffness of left shoulder joint  Problem List Patient Active Problem List   Diagnosis Date Noted  . Essential hypertension, benign 09/03/2014  . Morbid obesity ([place]) 03/06/2014  . HTN (hypertension) 03/06/2014  . Vertigo 03/06/2014  . Type II or unspecified type diabetes mellitus  without mention of complication, uncontrolled 03/06/2014  . Pure hypercholesterolemia 03/06/2014    Aaleyah Witherow PT 07/06/2018, 11:56 AM  [place], [place], [address] Phone: [phone]   Fax:  [phone]  Name: Jessica [name] MRN: [id number] Date of Birth: [birth year]

## 2018-07-07 ENCOUNTER — Other Ambulatory Visit: Payer: Self-pay | Admitting: Cardiology

## 2018-07-07 ENCOUNTER — Other Ambulatory Visit: Payer: Self-pay | Admitting: *Deleted

## 2018-07-07 MED ORDER — SPIRONOLACTONE 25 MG PO TABS: 25.0000 mg | ORAL_TABLET | Freq: Every day | ORAL | 3 refills | Status: DC

## 2018-07-07 NOTE — Telephone Encounter (Signed)
Outpatient Medication Detail    Disp Refills Start End   spironolactone (ALDACTONE) 25 MG tablet 90 tablet 3 07/07/2018    Sig - Route: Take 1 tablet (25 mg total) by mouth daily. - Oral   Sent to pharmacy as: spironolactone (ALDACTONE) 25 MG tablet   E-Prescribing Status: Receipt confirmed by pharmacy (07/07/2018 1:38 PM EDT)   Pharmacy   [facility], [place]

## 2018-07-12 DIAGNOSIS — Z86010 Personal history of colonic polyps: Secondary | ICD-10-CM | POA: Diagnosis not present

## 2018-07-12 DIAGNOSIS — D122 Benign neoplasm of ascending colon: Secondary | ICD-10-CM | POA: Diagnosis not present

## 2018-07-14 DIAGNOSIS — D122 Benign neoplasm of ascending colon: Secondary | ICD-10-CM | POA: Diagnosis not present

## 2018-07-18 ENCOUNTER — Ambulatory Visit: Payer: Medicare Other | Admitting: Physical Therapy

## 2018-07-18 ENCOUNTER — Encounter: Payer: Self-pay | Admitting: Physical Therapy

## 2018-07-18 DIAGNOSIS — G8929 Other chronic pain: Secondary | ICD-10-CM

## 2018-07-18 DIAGNOSIS — M25612 Stiffness of left shoulder, not elsewhere classified: Secondary | ICD-10-CM

## 2018-07-18 DIAGNOSIS — M25512 Pain in left shoulder: Principal | ICD-10-CM

## 2018-07-18 DIAGNOSIS — M6281 Muscle weakness (generalized): Secondary | ICD-10-CM

## 2018-07-18 NOTE — Therapy (Signed)
[place], [place], [address] Phone: [phone]   Fax:  [phone]  Physical Therapy Treatment  Patient Details  Name: SAHITHI ORDOYNE MRN: [id number] Date of Birth: [birth year] Referring Provider: Frederik Pear MD   Encounter Date: 07/18/2018  PT End of Session - 07/18/18 1056    Visit Number  6    Date for PT Re-Evaluation  07/25/18    PT Start Time  1017    PT Stop Time  1057    PT Time Calculation (min)  40 min    Activity Tolerance  Patient tolerated treatment well    Behavior During Therapy  [facility] for tasks assessed/performed       Past Medical History:  Diagnosis Date  . Anemia   . Anxiety   . Arthritis   . Cataract   . Depression   . Diabetes mellitus without complication ([place])   . Diastolic dysfunction   . Frozen shoulder   . Glaucoma   . Headache    History of migraines  . Heart murmur   . History of kidney stones   . Hyperlipidemia   . Hypertension   . Macular degeneration   . Neuropathy   . Obesity   . Sleep apnea   . Stroke [facility])    has been told by a Dr. Mort Sawyers that she may have had a mini stroke but no further testing was done to confirm  . Tachycardia     Past Surgical History:  Procedure Laterality Date  . CARDIAC CATHETERIZATION     -6/09-normal ejection fraction 65%, no angiographically significant CAD., M. Skains MD  . CESAREAN SECTION    . COLONOSCOPY    . foot spurs removed    . HYSTEROSCOPY W/D&C N/A 06/28/2017   Procedure: DILATATION AND CURETTAGE /HYSTEROSCOPY/POLYPECTOMY WITH MYOSURE;  Surgeon: Christophe Louis, MD;  Location: [facility];  Service: Gynecology;  Laterality: N/A;  . JOINT REPLACEMENT     right knee replaced  . knee spurs removed    . shoulder spurs removed    . TONSILLECTOMY AND ADENOIDECTOMY    . UPPER GI ENDOSCOPY      There were no vitals filed for this visit.  Subjective Assessment - 07/18/18 1024    Subjective  Pt stated that  her shoulder is doing better, but it is still waking her up at night.     Currently in Pain?  No/denies    Pain Score  0-No pain                       [facility] Adult PT Treatment/Exercise - 07/18/18 0001      Exercises   Exercises  Shoulder      Knee/Hip Exercises: Aerobic   Nustep  L4 X 4 min       Shoulder Exercises: Seated   Extension  Strengthening;Both;10 reps;20 reps;Theraband    Theraband Level (Shoulder Extension)  Level 2 (Red)    Row  Theraband;20 reps;Both    Theraband Level (Shoulder Row)  Level 3 (Green)    External Rotation  Left;20 reps;Theraband    Theraband Level (Shoulder External Rotation)  Level 1 (Yellow)    Internal Rotation  Theraband;20 reps;Left    Theraband Level (Shoulder Internal Rotation)  Level 1 (Yellow)    Flexion  20 reps;Weights;Both;Strengthening    Flexion Weight (lbs)  1    Abduction  Weights;20 reps;Both    ABduction Weight (  lbs)  1    Other Seated Exercises  bicep curl LUE red tband 2x15    Other Seated Exercises  tricep ext LUE red tband 2x15       Shoulder Exercises: ROM/Strengthening   UBE (Upper Arm Bike)  L2.5 x 3 min       Iontophoresis   Type of Iontophoresis  Dexamethasone    Location  L shoulder    Dose  92mmin    Time  4 hour patch               PT Short Term Goals - 06/15/18 1055      PT SHORT TERM GOAL #1   Title  independent with intial HEP    Status  Achieved        PT Long Term Goals - 07/18/18 1058      PT LONG TERM GOAL #1   Title  Improved Lt shoulder flexion to 155 degrees or better to improve function.    Status  On-going      PT LONG TERM GOAL #2   Title  able to peform ADLs with 2/10 pain or less in left shoulder.    Status  Partially Met      PT LONG TERM GOAL #3   Title  able to sleep without waking from left shoulder pain.    Status  On-going            Plan - 07/18/18 1056    Clinical Impression Statement  Pt reports improvement overall with decrease shoulder  pain. Some tactile cues needed with L shoulder internal and external rotation. Reports an improved mobility with OH activities.     Rehab Potential  Good    PT Frequency  2x / week    PT Duration  6 weeks    PT Treatment/Interventions  ADLs/Self Care Home Management;Cryotherapy;Electrical Stimulation;Iontophoresis 425mml Dexamethasone;Therapeutic exercise;Patient/family education;Manual techniques;Moist Heat;Ultrasound;Neuromuscular re-education;Passive range of motion;Dry needling;Taping    PT Next Visit Plan  add SDLY ABD to HEP; continue painfree strengthening and ionto       Patient will benefit from skilled therapeutic intervention in order to improve the following deficits and impairments:  Decreased range of motion, Pain, Decreased strength, Postural dysfunction, Impaired UE functional use  Visit Diagnosis: Chronic left shoulder pain  Muscle weakness (generalized)  Stiffness of left shoulder joint     Problem List Patient Active Problem List   Diagnosis Date Noted  . Essential hypertension, benign 09/03/2014  . Morbid obesity ([place]05/13/2015  . HTN (hypertension) 03/06/2014  . Vertigo 03/06/2014  . Type II or unspecified type diabetes mellitus without mention of complication, uncontrolled 03/06/2014  . Pure hypercholesterolemia 03/06/2014    Ro[place] JunPTA 07/18/2018, 10:59 AM  [id number]: [phone] Fax:  [phone]: JaNECIE WILCOXSONRN: [id number] of Birth: 05/1946/10/05

## 2018-07-20 ENCOUNTER — Ambulatory Visit: Payer: Medicare Other | Admitting: Physical Therapy

## 2018-07-20 ENCOUNTER — Encounter: Payer: Self-pay | Admitting: Physical Therapy

## 2018-07-20 DIAGNOSIS — M25612 Stiffness of left shoulder, not elsewhere classified: Secondary | ICD-10-CM

## 2018-07-20 DIAGNOSIS — M25512 Pain in left shoulder: Secondary | ICD-10-CM | POA: Diagnosis not present

## 2018-07-20 DIAGNOSIS — M6281 Muscle weakness (generalized): Secondary | ICD-10-CM | POA: Diagnosis not present

## 2018-07-20 DIAGNOSIS — G8929 Other chronic pain: Secondary | ICD-10-CM | POA: Diagnosis not present

## 2018-07-20 NOTE — Therapy (Signed)
[place], [place], [address] Phone: [phone]   Fax:  [phone]  Physical Therapy Treatment  Patient Details  Name: Jessica [name] MRN: [id number] Date of Birth: [birth year] Referring Provider: Frederik Pear MD   Encounter Date: 07/20/2018  PT End of Session - 07/20/18 1057    Visit Number  7    Date for PT Re-Evaluation  07/25/18    PT Start Time  3500    PT Stop Time  1100    PT Time Calculation (min)  45 min    Activity Tolerance  Patient tolerated treatment well    Behavior During Therapy  [facility] for tasks assessed/performed       Past Medical History:  Diagnosis Date  . Anemia   . Anxiety   . Arthritis   . Cataract   . Depression   . Diabetes mellitus without complication ([place])   . Diastolic dysfunction   . Frozen shoulder   . Glaucoma   . Headache    History of migraines  . Heart murmur   . History of kidney stones   . Hyperlipidemia   . Hypertension   . Macular degeneration   . Neuropathy   . Obesity   . Sleep apnea   . Stroke [facility])    has been told by a Dr. Mort Sawyers that she may have had a mini stroke but no further testing was done to confirm  . Tachycardia     Past Surgical History:  Procedure Laterality Date  . CARDIAC CATHETERIZATION     -6/09-normal ejection fraction 65%, no angiographically significant CAD., M. Skains MD  . CESAREAN SECTION    . COLONOSCOPY    . foot spurs removed    . HYSTEROSCOPY W/D&C N/A 06/28/2017   Procedure: DILATATION AND CURETTAGE /HYSTEROSCOPY/POLYPECTOMY WITH MYOSURE;  Surgeon: Christophe Louis, MD;  Location: [place] [facility];  Service: Gynecology;  Laterality: N/A;  . JOINT REPLACEMENT     right knee replaced  . knee spurs removed    . shoulder spurs removed    . TONSILLECTOMY AND ADENOIDECTOMY    . UPPER GI ENDOSCOPY      There were no vitals filed for this visit.  Subjective Assessment - 07/20/18 1020    Subjective  "Good enough"     Currently in Pain?  No/denies    Pain Score  0-No pain         [facility] PT Assessment - 07/20/18 0001      AROM   Left Shoulder Flexion  91 Degrees    Left Shoulder ABduction  121 Degrees    Left Shoulder Internal Rotation  25 Degrees    Left Shoulder External Rotation  49 Degrees                   [facility] Adult PT Treatment/Exercise - 07/20/18 0001      Shoulder Exercises: Seated   Extension  Strengthening;Both;10 reps;20 reps;Theraband    Theraband Level (Shoulder Extension)  Level 2 (Red)    Row  Theraband;Both;15 reps   x2   Theraband Level (Shoulder Row)  Level 3 (Green)    External Rotation  Left;20 reps;Theraband    Theraband Level (Shoulder External Rotation)  Level 1 (Yellow)    Internal Rotation  Theraband;20 reps;Left    Theraband Level (Shoulder Internal Rotation)  Level 1 (Yellow)    Flexion  20 reps;Weights;Both;Strengthening    Flexion Weight (lbs)  1  Abduction  Weights;20 reps;Both    ABduction Weight (lbs)  1    Other Seated Exercises  bicep curl LUE red tband 2x15    Other Seated Exercises  tricep ext LUE red tband 2x15       Shoulder Exercises: ROM/Strengthening   UBE (Upper Arm Bike)  L2.5 x 3 min each       Iontophoresis   Type of Iontophoresis  Dexamethasone    Location  L shoulder    Dose  44mmin    Time  4 hour patch               PT Short Term Goals - 06/15/18 1055      PT SHORT TERM GOAL #1   Title  independent with intial HEP    Status  Achieved        PT Long Term Goals - 07/18/18 1058      PT LONG TERM GOAL #1   Title  Improved Lt shoulder flexion to 155 degrees or better to improve function.    Status  On-going      PT LONG TERM GOAL #2   Title  able to peform ADLs with 2/10 pain or less in left shoulder.    Status  Partially Met      PT LONG TERM GOAL #3   Title  able to sleep without waking from left shoulder pain.    Status  On-going            Plan - 07/20/18 1058    Clinical Impression  Statement  Pt continues to do well with the exercises. Pt with a mild increase in L shoulder AROM. Some difficulty with resisted L shoulder external rotation compared to last treatment session..Marland Kitchen    PT Frequency  2x / week    PT Duration  6 weeks    PT Treatment/Interventions  ADLs/Self Care Home Management;Cryotherapy;Electrical Stimulation;Iontophoresis 463mml Dexamethasone;Therapeutic exercise;Patient/family education;Manual techniques;Moist Heat;Ultrasound;Neuromuscular re-education;Passive range of motion;Dry needling;Taping    PT Next Visit Plan  continue pain free strengthening and ionto       Patient will benefit from skilled therapeutic intervention in order to improve the following deficits and impairments:  Decreased range of motion, Pain, Decreased strength, Postural dysfunction, Impaired UE functional use  Visit Diagnosis: Chronic left shoulder pain  Muscle weakness (generalized)  Stiffness of left shoulder joint     Problem List Patient Active Problem List   Diagnosis Date Noted  . Essential hypertension, benign 09/03/2014  . Morbid obesity ([facility]05/13/2015  . HTN (hypertension) 03/06/2014  . Vertigo 03/06/2014  . Type II or unspecified type diabetes mellitus without mention of complication, uncontrolled 03/06/2014  . Pure hypercholesterolemia 03/06/2014    RoScot JunPTA 07/20/2018, 11:00 AM  [place]: [phone] Fax:  [phone]: Jessica DUFFUSRN: [id number] of Birth: 05/1946/05/31

## 2018-07-26 ENCOUNTER — Encounter: Payer: Self-pay | Admitting: Physical Therapy

## 2018-07-26 ENCOUNTER — Ambulatory Visit: Payer: Medicare Other | Attending: Family Medicine | Admitting: Physical Therapy

## 2018-07-26 DIAGNOSIS — M25512 Pain in left shoulder: Secondary | ICD-10-CM | POA: Insufficient documentation

## 2018-07-26 DIAGNOSIS — M6281 Muscle weakness (generalized): Secondary | ICD-10-CM | POA: Diagnosis not present

## 2018-07-26 DIAGNOSIS — G8929 Other chronic pain: Secondary | ICD-10-CM | POA: Diagnosis not present

## 2018-07-26 DIAGNOSIS — M25612 Stiffness of left shoulder, not elsewhere classified: Secondary | ICD-10-CM | POA: Diagnosis not present

## 2018-07-26 NOTE — Therapy (Signed)
[place], [place], [address] Phone: [phone]   Fax:  [phone]  Physical Therapy Treatment  Patient Details  Name: Jessica [name] MRN: [id number] Date of Birth: July 12, 1946 Referring Provider (PT): Frederik Pear MD   Encounter Date: 07/26/2018  PT End of Session - 07/26/18 0928    Visit Number  8    Date for PT Re-Evaluation  07/25/18    PT Start Time  0853    PT Stop Time  0930    PT Time Calculation (min)  37 min    Activity Tolerance  Patient tolerated treatment well    Behavior During Therapy  [facility] for tasks assessed/performed       Past Medical History:  Diagnosis Date  . Anemia   . Anxiety   . Arthritis   . Cataract   . Depression   . Diabetes mellitus without complication ([place])   . Diastolic dysfunction   . Frozen shoulder   . Glaucoma   . Headache    History of migraines  . Heart murmur   . History of kidney stones   . Hyperlipidemia   . Hypertension   . Macular degeneration   . Neuropathy   . Obesity   . Sleep apnea   . Stroke [facility])    has been told by a Dr. Mort Sawyers that she may have had a mini stroke but no further testing was done to confirm  . Tachycardia     Past Surgical History:  Procedure Laterality Date  . CARDIAC CATHETERIZATION     -6/09-normal ejection fraction 65%, no angiographically significant CAD., M. Skains MD  . CESAREAN SECTION    . COLONOSCOPY    . foot spurs removed    . HYSTEROSCOPY W/D&C N/A 06/28/2017   Procedure: DILATATION AND CURETTAGE /HYSTEROSCOPY/POLYPECTOMY WITH MYOSURE;  Surgeon: Christophe Louis, MD;  Location: [place] [facility];  Service: Gynecology;  Laterality: N/A;  . JOINT REPLACEMENT     right knee replaced  . knee spurs removed    . shoulder spurs removed    . TONSILLECTOMY AND ADENOIDECTOMY    . UPPER GI ENDOSCOPY      There were no vitals filed for this visit.  Subjective Assessment - 07/26/18 0853    Subjective  "It seems  to be doing good"    Currently in Pain?  No/denies    Pain Score  0-No pain                       [facility] Adult PT Treatment/Exercise - 07/26/18 0001      Shoulder Exercises: Seated   Extension  Strengthening;Both;10 reps;20 reps;Theraband    Theraband Level (Shoulder Extension)  Level 2 (Red)    Row  15 reps;Both   x2   Theraband Level (Shoulder Row)  Level 3 (Green)    External Rotation  Left;20 reps;Theraband    Theraband Level (Shoulder External Rotation)  Level 1 (Yellow)    Internal Rotation  Theraband;20 reps;Left    Theraband Level (Shoulder Internal Rotation)  Level 1 (Yellow)    Flexion  20 reps;Weights;Both;Strengthening    Flexion Weight (lbs)  2    Abduction  Weights;20 reps;Both    ABduction Weight (lbs)  2    Other Seated Exercises  bicep curl LUE red tband 2x15    Other Seated Exercises  tricep ext LUE red tband 2x15       Shoulder Exercises:  ROM/Strengthening   UBE (Upper Arm Bike)  L2.5 x 3 min each       Iontophoresis   Type of Iontophoresis  Dexamethasone    Location  L shoulder    Dose  21mmin    Time  4 hour patch               PT Short Term Goals - 06/15/18 1055      PT SHORT TERM GOAL #1   Title  independent with intial HEP    Status  Achieved        PT Long Term Goals - 07/26/18 0929      PT LONG TERM GOAL #1   Title  Improved Lt shoulder flexion to 155 degrees or better to improve function.    Status  Partially Met      PT LONG TERM GOAL #2   Title  able to peform ADLs with 2/10 pain or less in left shoulder.    Status  Partially Met      PT LONG TERM GOAL #3   Title  able to sleep without waking from left shoulder pain.    Status  Partially Met            Plan - 07/26/18 0929    Clinical Impression Statement  Pt is progressing and doing well overall, no issues with today's exercises only some L shoulder weakness with external rotation.    Rehab Potential  Good    PT Frequency  2x / week    PT  Treatment/Interventions  ADLs/Self Care Home Management;Cryotherapy;Electrical Stimulation;Iontophoresis 466mml Dexamethasone;Therapeutic exercise;Patient/family education;Manual techniques;Moist Heat;Ultrasound;Neuromuscular re-education;Passive range of motion;Dry needling;Taping    PT Next Visit Plan  Possible D/C       Patient will benefit from skilled therapeutic intervention in order to improve the following deficits and impairments:  Decreased range of motion, Pain, Decreased strength, Postural dysfunction, Impaired UE functional use  Visit Diagnosis: Chronic left shoulder pain  Muscle weakness (generalized)  Stiffness of left shoulder joint     Problem List Patient Active Problem List   Diagnosis Date Noted  . Essential hypertension, benign 09/03/2014  . Morbid obesity ([facility]05/13/2015  . HTN (hypertension) 03/06/2014  . Vertigo 03/06/2014  . Type II or unspecified type diabetes mellitus without mention of complication, uncontrolled 03/06/2014  . Pure hypercholesterolemia 03/06/2014    RoScot JunPTA 07/26/2018, 9:30 AM  [place]one: [phone] Fax:  [phone]: Jessica MCLINRN: [id number] of Birth: 8/02-26-1947

## 2018-07-27 DIAGNOSIS — R921 Mammographic calcification found on diagnostic imaging of breast: Secondary | ICD-10-CM | POA: Diagnosis not present

## 2018-07-28 ENCOUNTER — Ambulatory Visit: Payer: Medicare Other | Admitting: Physical Therapy

## 2018-07-28 DIAGNOSIS — M25512 Pain in left shoulder: Secondary | ICD-10-CM | POA: Diagnosis not present

## 2018-07-28 DIAGNOSIS — M6281 Muscle weakness (generalized): Secondary | ICD-10-CM | POA: Diagnosis not present

## 2018-07-28 DIAGNOSIS — G8929 Other chronic pain: Secondary | ICD-10-CM | POA: Diagnosis not present

## 2018-07-28 DIAGNOSIS — M25612 Stiffness of left shoulder, not elsewhere classified: Secondary | ICD-10-CM

## 2018-07-28 NOTE — Therapy (Signed)
[place], [place], [address] Phone: [phone]   Fax:  [phone]  Physical Therapy Treatment  Patient Details  Name: Jessica [name] MRN: [id number] Date of Birth: 23-May-1946 Referring Provider (PT): Frederik Pear MD   Encounter Date: 07/28/2018  PT End of Session - 07/28/18 0941    Visit Number  9    Date for PT Re-Evaluation  08/25/18    PT Start Time  0935    PT Stop Time  1015    PT Time Calculation (min)  40 min       Past Medical History:  Diagnosis Date  . Anemia   . Anxiety   . Arthritis   . Cataract   . Depression   . Diabetes mellitus without complication ([place])   . Diastolic dysfunction   . Frozen shoulder   . Glaucoma   . Headache    History of migraines  . Heart murmur   . History of kidney stones   . Hyperlipidemia   . Hypertension   . Macular degeneration   . Neuropathy   . Obesity   . Sleep apnea   . Stroke [facility])    has been told by a Dr. Mort Sawyers that she may have had a mini stroke but no further testing was done to confirm  . Tachycardia     Past Surgical History:  Procedure Laterality Date  . CARDIAC CATHETERIZATION     -6/09-normal ejection fraction 65%, no angiographically significant CAD., M. Skains MD  . CESAREAN SECTION    . COLONOSCOPY    . foot spurs removed    . HYSTEROSCOPY W/D&C N/A 06/28/2017   Procedure: DILATATION AND CURETTAGE /HYSTEROSCOPY/POLYPECTOMY WITH MYOSURE;  Surgeon: Christophe Louis, MD;  Location: [place] [facility];  Service: Gynecology;  Laterality: N/A;  . JOINT REPLACEMENT     right knee replaced  . knee spurs removed    . shoulder spurs removed    . TONSILLECTOMY AND ADENOIDECTOMY    . UPPER GI ENDOSCOPY      There were no vitals filed for this visit.  Subjective Assessment - 07/28/18 0939    Subjective  95% better, ex and patches really help    Currently in Pain?  No/denies         [facility] PT Assessment - 07/28/18 0001       AROM   Left Shoulder Flexion  155 Degrees    Left Shoulder ABduction  160 Degrees    Left Shoulder Internal Rotation  70 Degrees    Left Shoulder External Rotation  80 Degrees      Strength   Left Shoulder Flexion  4+/5                   [facility] Adult PT Treatment/Exercise - 07/28/18 0001      Exercises   Exercises  Shoulder      Shoulder Exercises: Seated   Extension  Strengthening;Both;Theraband;15 reps    Theraband Level (Shoulder Extension)  Level 2 (Red)    Row  15 reps;Both;Strengthening    Theraband Level (Shoulder Row)  Level 3 (Green)    External Rotation  Strengthening;Both;15 reps;Theraband    Theraband Level (Shoulder External Rotation)  Level 2 (Red)    Internal Rotation  Strengthening;Right;15 reps;Theraband    Theraband Level (Shoulder Internal Rotation)  Level 2 (Red)    Abduction  Strengthening;Both;15 reps;Theraband    Theraband Level (Shoulder ABduction)  Level 2 (Red)  Other Seated Exercises  2# free wt UE ex 15 reps      Shoulder Exercises: ROM/Strengthening   UBE (Upper Arm Bike)  L2.5 x 3 min each       Iontophoresis   Type of Iontophoresis  Dexamethasone    Location  L shoulder    Dose  52mmin    Time  4 hour patch               PT Short Term Goals - 06/15/18 1055      PT SHORT TERM GOAL #1   Title  independent with intial HEP    Status  Achieved        PT Long Term Goals - 07/28/18 09379     PT LONG TERM GOAL #1   Title  Improved Lt shoulder flexion to 155 degrees or better to improve function.    Status  Achieved      PT LONG TERM GOAL #2   Title  able to peform ADLs with 2/10 pain or less in left shoulder.    Status  Achieved      PT LONG TERM GOAL #3   Title  able to sleep without waking from left shoulder pain.    Baseline  5 hours    Status  Achieved      PT LONG TERM GOAL #4   Title  increase left shoulder strength to 4+/5 or better to improve function    Status  Achieved      PT LONG TERM GOAL #5    Title  improved Left shoulder ER to allow patient to easily reach behind head.    Status  Achieved            Plan - 07/28/18 0942    Clinical Impression Statement  all goals met,very pleased with progress, pt has HEP    PT Treatment/Interventions  ADLs/Self Care Home Management;Cryotherapy;Electrical Stimulation;Iontophoresis 472mml Dexamethasone;Therapeutic exercise;Patient/family education;Manual techniques;Moist Heat;Ultrasound;Neuromuscular re-education;Passive range of motion;Dry needling;Taping    PT Next Visit Plan  D/C       Patient will benefit from skilled therapeutic intervention in order to improve the following deficits and impairments:  Decreased range of motion, Pain, Decreased strength, Postural dysfunction, Impaired UE functional use  Visit Diagnosis: Chronic left shoulder pain  Muscle weakness (generalized)  Stiffness of left shoulder joint     Problem List Patient Active Problem List   Diagnosis Date Noted  . Essential hypertension, benign 09/03/2014  . Morbid obesity ([facility] [place]05/13/2015  . HTN (hypertension) 03/06/2014  . Vertigo 03/06/2014  . Type II or unspecified type diabetes mellitus without mention of complication, uncontrolled 03/06/2014  . Pure hypercholesterolemia 03/06/2014   PHYSICAL THERAPY DISCHARGE SUMMARY   Plan: Patient agrees to discharge.  Patient goals were met. Patient is being discharged due to meeting the stated rehab goals.  ?????        Jessica [name],ANGIE PTA 07/28/2018, 9:57 AM  [place]one: [phone] Fax:  [phone]: Jessica FARFANRN: [id number] of Birth: 8/August 11, 1946

## 2018-08-01 ENCOUNTER — Ambulatory Visit: Payer: Medicare Other | Admitting: Physical Therapy

## 2018-08-03 ENCOUNTER — Ambulatory Visit: Payer: Medicare Other | Admitting: Physical Therapy

## 2018-08-09 ENCOUNTER — Other Ambulatory Visit: Payer: Self-pay | Admitting: Cardiology

## 2018-08-30 DIAGNOSIS — E669 Obesity, unspecified: Secondary | ICD-10-CM | POA: Diagnosis not present

## 2018-08-30 DIAGNOSIS — Z139 Encounter for screening, unspecified: Secondary | ICD-10-CM | POA: Diagnosis not present

## 2018-08-30 DIAGNOSIS — E1165 Type 2 diabetes mellitus with hyperglycemia: Secondary | ICD-10-CM | POA: Diagnosis not present

## 2018-08-30 DIAGNOSIS — I10 Essential (primary) hypertension: Secondary | ICD-10-CM | POA: Diagnosis not present

## 2018-08-30 DIAGNOSIS — E7800 Pure hypercholesterolemia, unspecified: Secondary | ICD-10-CM | POA: Diagnosis not present

## 2018-08-30 DIAGNOSIS — Z9641 Presence of insulin pump (external) (internal): Secondary | ICD-10-CM | POA: Diagnosis not present

## 2018-08-30 DIAGNOSIS — G609 Hereditary and idiopathic neuropathy, unspecified: Secondary | ICD-10-CM | POA: Diagnosis not present

## 2018-09-05 DIAGNOSIS — E1351 Other specified diabetes mellitus with diabetic peripheral angiopathy without gangrene: Secondary | ICD-10-CM | POA: Diagnosis not present

## 2018-09-05 DIAGNOSIS — L602 Onychogryphosis: Secondary | ICD-10-CM | POA: Diagnosis not present

## 2018-09-05 DIAGNOSIS — L84 Corns and callosities: Secondary | ICD-10-CM | POA: Diagnosis not present

## 2018-09-07 DIAGNOSIS — E119 Type 2 diabetes mellitus without complications: Secondary | ICD-10-CM | POA: Diagnosis not present

## 2018-09-07 DIAGNOSIS — Z780 Asymptomatic menopausal state: Secondary | ICD-10-CM | POA: Diagnosis not present

## 2018-09-07 DIAGNOSIS — E349 Endocrine disorder, unspecified: Secondary | ICD-10-CM | POA: Diagnosis not present

## 2018-09-07 DIAGNOSIS — Z96651 Presence of right artificial knee joint: Secondary | ICD-10-CM | POA: Diagnosis not present

## 2018-10-03 DIAGNOSIS — M67912 Unspecified disorder of synovium and tendon, left shoulder: Secondary | ICD-10-CM | POA: Diagnosis not present

## 2018-10-26 ENCOUNTER — Other Ambulatory Visit: Payer: Self-pay

## 2018-10-26 ENCOUNTER — Encounter: Payer: Self-pay | Admitting: Physical Therapy

## 2018-10-26 ENCOUNTER — Ambulatory Visit: Payer: Medicare Other | Attending: Family Medicine | Admitting: Physical Therapy

## 2018-10-26 DIAGNOSIS — M6281 Muscle weakness (generalized): Secondary | ICD-10-CM | POA: Diagnosis not present

## 2018-10-26 DIAGNOSIS — M25612 Stiffness of left shoulder, not elsewhere classified: Secondary | ICD-10-CM | POA: Insufficient documentation

## 2018-10-26 DIAGNOSIS — M25512 Pain in left shoulder: Secondary | ICD-10-CM | POA: Insufficient documentation

## 2018-10-26 DIAGNOSIS — G8929 Other chronic pain: Secondary | ICD-10-CM | POA: Diagnosis not present

## 2018-10-26 DIAGNOSIS — M25511 Pain in right shoulder: Secondary | ICD-10-CM | POA: Insufficient documentation

## 2018-10-26 NOTE — Therapy (Signed)
[place], [place], [address] Phone: [phone]   Fax:  [phone]  Physical Therapy Evaluation  Patient Details  Name: Jessica [name] MRN: [id number] Date of Birth: Nov 08, 1945 Referring Provider (PT): Frederik Pear MD   Encounter Date: 10/26/2018  PT End of Session - 10/26/18 1133    Visit Number  1    Date for PT Re-Evaluation  12/25/18    PT Start Time  1100    PT Stop Time  1154    PT Time Calculation (min)  54 min    Activity Tolerance  Patient tolerated treatment well    Behavior During Therapy  [facility] for tasks assessed/performed       Past Medical History:  Diagnosis Date  . Anemia   . Anxiety   . Arthritis   . Cataract   . Depression   . Diabetes mellitus without complication ([place])   . Diastolic dysfunction   . Frozen shoulder   . Glaucoma   . Headache    History of migraines  . Heart murmur   . History of kidney stones   . Hyperlipidemia   . Hypertension   . Macular degeneration   . Neuropathy   . Obesity   . Sleep apnea   . Stroke [facility])    has been told by a Dr. Mort Sawyers that she may have had a mini stroke but no further testing was done to confirm  . Tachycardia     Past Surgical History:  Procedure Laterality Date  . CARDIAC CATHETERIZATION     -6/09-normal ejection fraction 65%, no angiographically significant CAD., M. Skains MD  . CESAREAN SECTION    . COLONOSCOPY    . foot spurs removed    . HYSTEROSCOPY W/D&C N/A 06/28/2017   Procedure: DILATATION AND CURETTAGE /HYSTEROSCOPY/POLYPECTOMY WITH MYOSURE;  Surgeon: Christophe Louis, MD;  Location: [facility];  Service: Gynecology;  Laterality: N/A;  . JOINT REPLACEMENT     right knee replaced  . knee spurs removed    . shoulder spurs removed    . TONSILLECTOMY AND ADENOIDECTOMY    . UPPER GI ENDOSCOPY      There were no vitals filed for this visit.   Subjective Assessment - 10/26/18 1055    Subjective  Patient  was seen in September for shoulder issues, she did great with the exercises and the ionto patches, she was discharged with goals met in October.  She reports that she had a fall December 1st.  She tells me that she had great difficulty getting up from the floor and both shoulders have really been hurting     Pertinent History  left shoulder manipulation 20  years ago, R TKR, anxiety/depression, DM    Diagnostic tests  xrays bone spurs    Patient Stated Goals  decrease pain, have better motions and better care for her 20 year old dad    Currently in Pain?  Yes    Pain Score  2     Pain Location  Shoulder    Pain Orientation  Right;Left    Pain Descriptors / Indicators  Sore;Aching    Pain Type  Acute pain    Pain Onset  More than a month ago    Pain Frequency  Constant    Aggravating Factors   using the arms, dressing, doing hair    Pain Relieving Factors  rest, no moving  [facility] PT Assessment - 10/26/18 0001      Assessment   Medical Diagnosis  left shoulder pain, right shoulder pain    Referring Provider (PT)  Frederik Pear MD    Onset Date/Surgical Date  09/24/18    Prior Therapy  for right knee pain, and right TKR in 2009, left shoulder pain earlier this year      Precautions   Precautions  None      Balance Screen   Has the patient fallen in the past 6 months  Yes    How many times?  2    Has the patient had a decrease in activity level because of a fear of falling?   No    Is the patient reluctant to leave their home because of a fear of falling?   No      Home Environment   Additional Comments  has a ramp, does housework, cooks, cares for her 51 year old dad      Prior Function   Level of Independence  Independent with household mobility with device    Vocation  Retired    Leisure  no exercise      Posture/Postural Control   Posture Comments  fwd head, rounded shoulders      AROM   Right/Left Shoulder  Right    Right Shoulder Flexion  105 Degrees    Right  Shoulder ABduction  80 Degrees    Right Shoulder Internal Rotation  40 Degrees    Right Shoulder External Rotation  60 Degrees    Left Shoulder Flexion  50 Degrees    Left Shoulder ABduction  40 Degrees    Left Shoulder Internal Rotation  30 Degrees    Left Shoulder External Rotation  35 Degrees      PROM   Right/Left Shoulder  Right    Right Shoulder Flexion  135 Degrees    Right Shoulder ABduction  115 Degrees    Right Shoulder Internal Rotation  50 Degrees    Right Shoulder External Rotation  65 Degrees    Left Shoulder Flexion  90 Degrees    Left Shoulder ABduction  55 Degrees    Left Shoulder Internal Rotation  40 Degrees    Left Shoulder External Rotation  45 Degrees      Strength   Right/Left Shoulder  Right    Right Shoulder Flexion  3+/5    Right Shoulder Internal Rotation  4-/5    Right Shoulder External Rotation  4-/5    Left Shoulder Flexion  3-/5    Left Shoulder Internal Rotation  3+/5    Left Shoulder External Rotation  3+/5      Palpation   Palpation comment  patient is very tight iwth spasms in the upper traps, very tender in the shoulders the supraspinatus, tender in the rhomboids                Objective measurements completed on examination: See above findings.      [facility] Adult PT Treatment/Exercise - 10/26/18 0001      Modalities   Modalities  Electrical Stimulation;Moist Heat      Moist Heat Therapy   Number Minutes Moist Heat  15 Minutes    Moist Heat Location  Shoulder      Electrical Stimulation   Electrical Stimulation Location  left shoulder    Electrical Stimulation Action  IFC    Electrical Stimulation Parameters  sitting    Electrical Stimulation Goals  Pain  PT Short Term Goals - 10/26/18 1139      PT SHORT TERM GOAL #1   Title  independent with intial HEP    Time  2    Period  Weeks    Status  New        PT Long Term Goals - 10/26/18 1139      PT LONG TERM GOAL #1   Title  Improved Lt  shoulder flexion to 155 degrees or better to improve function.    Time  8    Period  Weeks    Status  New      PT LONG TERM GOAL #2   Title  able to peform ADLs with 2/10 pain or less in left shoulder.    Time  8    Period  Weeks    Status  New      PT LONG TERM GOAL #3   Title  able to sleep without waking from left shoulder pain.    Time  8    Period  Weeks    Status  New      PT LONG TERM GOAL #4   Title  increase left shoulder strength to 4+/5 or better to improve function    Time  8    Period  Weeks    Status  New      PT LONG TERM GOAL #5   Title  improved Left shoulder ER to allow patient to easily reach behind head.    Time  8    Period  Weeks    Status  New             Plan - 10/26/18 1134    Clinical Impression Statement  Patient was discharged from PT in October with all goals met, she was doing great, reports that she fell in December and she struggled a lot to get up hurting her shoulders L>R.  She has lost over half of the motion that she had back in October for her left shoulder, the right shoulder is limited as well.  She has a lot of tenderness and tightness with trigger points in the upper taps and into the RC mms.      History and Personal Factors relevant to plan of care:  DM, obesitiy, heart issues, anxiety/depression    Clinical Presentation  Stable    Clinical Decision Making  Low    Rehab Potential  Good    PT Frequency  2x / week    PT Duration  8 weeks    PT Treatment/Interventions  ADLs/Self Care Home Management;Cryotherapy;Electrical Stimulation;Iontophoresis 88m/ml Dexamethasone;Therapeutic exercise;Patient/family education;Manual techniques;Moist Heat;Ultrasound;Neuromuscular re-education;Passive range of motion;Dry needling;Taping    PT Next Visit Plan  slowly start exercises, could do ionto    Consulted and Agree with Plan of Care  Patient       Patient will benefit from skilled therapeutic intervention in order to improve the  following deficits and impairments:  Decreased range of motion, Pain, Decreased strength, Postural dysfunction, Impaired UE functional use, Increased muscle spasms  Visit Diagnosis: Chronic left shoulder pain - Plan: PT plan of care cert/re-cert  Muscle weakness (generalized) - Plan: PT plan of care cert/re-cert  Stiffness of left shoulder joint - Plan: PT plan of care cert/re-cert  Acute pain of right shoulder - Plan: PT plan of care cert/re-cert     Problem List Patient Active Problem List   Diagnosis Date Noted  . Essential hypertension, benign 09/03/2014  . Morbid obesity (  [place]) 03/06/2014  . HTN (hypertension) 03/06/2014  . Vertigo 03/06/2014  . Type II or unspecified type diabetes mellitus without mention of complication, uncontrolled 03/06/2014  . Pure hypercholesterolemia 03/06/2014    Sumner Boast., PT 10/26/2018, 11:42 AM  [place], [place], [address] Phone: [phone]   Fax:  [phone]  Name: Jessica [name] MRN: [id number] Date of Birth: 1946-03-05

## 2018-11-02 ENCOUNTER — Ambulatory Visit: Payer: Medicare Other | Admitting: Physical Therapy

## 2018-11-02 DIAGNOSIS — M25511 Pain in right shoulder: Secondary | ICD-10-CM | POA: Diagnosis not present

## 2018-11-02 DIAGNOSIS — G8929 Other chronic pain: Secondary | ICD-10-CM | POA: Diagnosis not present

## 2018-11-02 DIAGNOSIS — M25612 Stiffness of left shoulder, not elsewhere classified: Secondary | ICD-10-CM

## 2018-11-02 DIAGNOSIS — M6281 Muscle weakness (generalized): Secondary | ICD-10-CM

## 2018-11-02 DIAGNOSIS — M25512 Pain in left shoulder: Secondary | ICD-10-CM | POA: Diagnosis not present

## 2018-11-02 NOTE — Therapy (Signed)
[place], [place], [address] Phone: [phone]   Fax:  [phone]  Physical Therapy Treatment  Patient Details  Name: Jessica [name] MRN: [id number] Date of Birth: 27-Mar-1946 Referring Provider (PT): Frederik Pear MD   Encounter Date: 11/02/2018  PT End of Session - 11/02/18 1041    Visit Number  2    Date for PT Re-Evaluation  12/25/18    PT Start Time  1011    PT Stop Time  1100    PT Time Calculation (min)  49 min       Past Medical History:  Diagnosis Date  . Anemia   . Anxiety   . Arthritis   . Cataract   . Depression   . Diabetes mellitus without complication ([place])   . Diastolic dysfunction   . Frozen shoulder   . Glaucoma   . Headache    History of migraines  . Heart murmur   . History of kidney stones   . Hyperlipidemia   . Hypertension   . Macular degeneration   . Neuropathy   . Obesity   . Sleep apnea   . Stroke [facility])    has been told by a Dr. Mort Sawyers that she may have had a mini stroke but no further testing was done to confirm  . Tachycardia     Past Surgical History:  Procedure Laterality Date  . CARDIAC CATHETERIZATION     -6/09-normal ejection fraction 65%, no angiographically significant CAD., M. Skains MD  . CESAREAN SECTION    . COLONOSCOPY    . foot spurs removed    . HYSTEROSCOPY W/D&C N/A 06/28/2017   Procedure: DILATATION AND CURETTAGE /HYSTEROSCOPY/POLYPECTOMY WITH MYOSURE;  Surgeon: Christophe Louis, MD;  Location: [facility];  Service: Gynecology;  Laterality: N/A;  . JOINT REPLACEMENT     right knee replaced  . knee spurs removed    . shoulder spurs removed    . TONSILLECTOMY AND ADENOIDECTOMY    . UPPER GI ENDOSCOPY      There were no vitals filed for this visit.  Subjective Assessment - 11/02/18 1014    Subjective  shld kept me up all night. trouble reaching most things    Currently in Pain?  Yes    Pain Score  10-Worst pain ever    Pain  Location  Shoulder    Pain Orientation  Left;Right                       [facility] Adult PT Treatment/Exercise - 11/02/18 0001      Shoulder Exercises: Seated   Extension  Strengthening;Both;20 reps;Theraband    Theraband Level (Shoulder Extension)  Level 1 (Yellow)    Retraction  Strengthening;Both;20 reps;Theraband    Theraband Level (Shoulder Retraction)  Level 1 (Yellow)    Horizontal ABduction  Strengthening;Both;20 reps;Theraband    Theraband Level (Shoulder Horizontal ABduction)  Level 1 (Yellow)    External Rotation  Strengthening;Both;20 reps;Theraband    Theraband Level (Shoulder External Rotation)  Level 1 (Yellow)    Internal Rotation  Strengthening;Left;Right;15 reps;Theraband    Theraband Level (Shoulder Internal Rotation)  Level 1 (Yellow)    Other Seated Exercises  cane ex flex,abd and chest press 10 each      Shoulder Exercises: Standing   Other Standing Exercises  cane ex ext and IR 10 each      Shoulder Exercises: ROM/Strengthening   UBE (Upper Arm  Bike)  L 2 2 fwd/2 back      Moist Heat Therapy   Number Minutes Moist Heat  15 Minutes    Moist Heat Location  Shoulder      Electrical Stimulation   Electrical Stimulation Location  left shoulder    Electrical Stimulation Action  IFC    Electrical Stimulation Parameters  sitting     Electrical Stimulation Goals  Pain      Manual Therapy   Manual Therapy  Passive ROM    Manual therapy comments  in sitting    Passive ROM  Lshoulder all directions                PT Short Term Goals - 10/26/18 1139      PT SHORT TERM GOAL #1   Title  independent with intial HEP    Time  2    Period  Weeks    Status  New        PT Long Term Goals - 10/26/18 1139      PT LONG TERM GOAL #1   Title  Improved Lt shoulder flexion to 155 degrees or better to improve function.    Time  8    Period  Weeks    Status  New      PT LONG TERM GOAL #2   Title  able to peform ADLs with 2/10 pain or less in  left shoulder.    Time  8    Period  Weeks    Status  New      PT LONG TERM GOAL #3   Title  able to sleep without waking from left shoulder pain.    Time  8    Period  Weeks    Status  New      PT LONG TERM GOAL #4   Title  increase left shoulder strength to 4+/5 or better to improve function    Time  8    Period  Weeks    Status  New      PT LONG TERM GOAL #5   Title  improved Left shoulder ER to allow patient to easily reach behind head.    Time  8    Period  Weeks    Status  New            Plan - 11/02/18 1041    Clinical Impression Statement  pt tolerated initail ex progression well, some cuing needed to relax traps. good PROM but pain/catch with ecc flexion. weakness noted in left UE as compared to RT    PT Treatment/Interventions  ADLs/Self Care Home Management;Cryotherapy;Electrical Stimulation;Iontophoresis 4mg /ml Dexamethasone;Therapeutic exercise;Patient/family education;Manual techniques;Moist Heat;Ultrasound;Neuromuscular re-education;Passive range of motion;Dry needling;Taping    PT Next Visit Plan  slowly start exercises, could do ionto       Patient will benefit from skilled therapeutic intervention in order to improve the following deficits and impairments:  Decreased range of motion, Pain, Decreased strength, Postural dysfunction, Impaired UE functional use, Increased muscle spasms  Visit Diagnosis: Chronic left shoulder pain  Muscle weakness (generalized)  Stiffness of left shoulder joint  Acute pain of right shoulder     Problem List Patient Active Problem List   Diagnosis Date Noted  . Essential hypertension, benign 09/03/2014  . Morbid obesity ([place]) 03/06/2014  . HTN (hypertension) 03/06/2014  . Vertigo 03/06/2014  . Type II or unspecified type diabetes mellitus without mention of complication, uncontrolled 03/06/2014  . Pure hypercholesterolemia 03/06/2014  Aarna Mihalko,ANGIE PTA 11/02/2018, 10:43 AM  [place], [place], [address] Phone: [phone]   Fax:  [phone]  Name: Jessica [name] MRN: [id number] Date of Birth: [birth year]

## 2018-11-03 ENCOUNTER — Ambulatory Visit: Payer: Medicare Other | Admitting: Physical Therapy

## 2018-11-03 DIAGNOSIS — M25512 Pain in left shoulder: Principal | ICD-10-CM

## 2018-11-03 DIAGNOSIS — G8929 Other chronic pain: Secondary | ICD-10-CM | POA: Diagnosis not present

## 2018-11-03 DIAGNOSIS — M6281 Muscle weakness (generalized): Secondary | ICD-10-CM | POA: Diagnosis not present

## 2018-11-03 DIAGNOSIS — M25511 Pain in right shoulder: Secondary | ICD-10-CM | POA: Diagnosis not present

## 2018-11-03 DIAGNOSIS — M25612 Stiffness of left shoulder, not elsewhere classified: Secondary | ICD-10-CM | POA: Diagnosis not present

## 2018-11-03 NOTE — Therapy (Signed)
[place], [place], [address] Phone: [phone]   Fax:  [phone]  Physical Therapy Treatment  Patient Details  Name: Jessica [name] MRN: [id number] Date of Birth: 25-Apr-1946 Referring Provider (PT): Frederik Pear MD   Encounter Date: 11/03/2018  PT End of Session - 11/03/18 1007    Visit Number  3    Date for PT Re-Evaluation  12/25/18    Authorization Type  KX    PT Start Time  1007    PT Stop Time  1102    PT Time Calculation (min)  55 min    Activity Tolerance  Patient tolerated treatment well    Behavior During Therapy  [facility] for tasks assessed/performed       Past Medical History:  Diagnosis Date  . Anemia   . Anxiety   . Arthritis   . Cataract   . Depression   . Diabetes mellitus without complication ([place])   . Diastolic dysfunction   . Frozen shoulder   . Glaucoma   . Headache    History of migraines  . Heart murmur   . History of kidney stones   . Hyperlipidemia   . Hypertension   . Macular degeneration   . Neuropathy   . Obesity   . Sleep apnea   . Stroke [facility])    has been told by a Dr. Mort Sawyers that she may have had a mini stroke but no further testing was done to confirm  . Tachycardia     Past Surgical History:  Procedure Laterality Date  . CARDIAC CATHETERIZATION     -6/09-normal ejection fraction 65%, no angiographically significant CAD., M. Skains MD  . CESAREAN SECTION    . COLONOSCOPY    . foot spurs removed    . HYSTEROSCOPY W/D&C N/A 06/28/2017   Procedure: DILATATION AND CURETTAGE /HYSTEROSCOPY/POLYPECTOMY WITH MYOSURE;  Surgeon: Christophe Louis, MD;  Location: [facility];  Service: Gynecology;  Laterality: N/A;  . JOINT REPLACEMENT     right knee replaced  . knee spurs removed    . shoulder spurs removed    . TONSILLECTOMY AND ADENOIDECTOMY    . UPPER GI ENDOSCOPY      There were no vitals filed for this visit.  Subjective Assessment - 11/03/18 1007     Subjective  Pain is a little better since yesterday    Pertinent History  left shoulder manipulation 20  years ago, R TKR, anxiety/depression, DM    Limitations  Lifting;Walking;Standing;House hold activities    Diagnostic tests  xrays bone spurs    Patient Stated Goals  decrease pain, have better motions and better care for her 2 year old dad    Currently in Pain?  Yes    Pain Score  8     Pain Location  Shoulder    Pain Orientation  Right;Left    Pain Descriptors / Indicators  Sore;Aching    Pain Type  Acute pain                       [facility] Adult PT Treatment/Exercise - 11/03/18 0001      Shoulder Exercises: Seated   Extension  Strengthening;Both;20 reps;Theraband;10 reps    Theraband Level (Shoulder Extension)  Level 1 (Yellow)    Retraction  Strengthening;Both;10 reps    Row  Strengthening;Both;20 reps;10 reps;Theraband    Theraband Level (Shoulder Row)  Level 1 (Yellow)    Horizontal  ABduction  Strengthening;Right;Theraband;15 reps    Theraband Level (Shoulder Horizontal ABduction)  Level 1 (Yellow)    Horizontal ABduction Limitations  left arm isometric at 90 deg flexion    External Rotation  Strengthening;Both;Left;Right;Theraband;15 reps    Theraband Level (Shoulder External Rotation)  Level 1 (Yellow)    External Rotation Limitations  left no resistance    Internal Rotation  Strengthening;Left;Right;Theraband;20 reps;10 reps    Theraband Level (Shoulder Internal Rotation)  Level 1 (Yellow)    Other Seated Exercises  cane ex flex, ER, ,abd and chest press 10 each      Shoulder Exercises: ROM/Strengthening   UBE (Upper Arm Bike)  L 2 2 fwd/2 back      Modalities   Modalities  Electrical Stimulation;Moist Heat      Moist Heat Therapy   Number Minutes Moist Heat  15 Minutes    Moist Heat Location  Shoulder      Electrical Stimulation   Electrical Stimulation Location  left shoulder 80-150 Hz x 15 min    Electrical Stimulation Goals  Pain       Manual Therapy   Manual Therapy  Passive ROM    Manual therapy comments  in sitting    Passive ROM  Flex, ER and ABD               PT Short Term Goals - 11/03/18 1049      PT SHORT TERM GOAL #1   Title  independent with intial HEP    Time  2    Period  Weeks    Status  On-going        PT Long Term Goals - 11/03/18 1049      PT LONG TERM GOAL #1   Status  On-going      PT LONG TERM GOAL #2   Title  able to peform ADLs with 2/10 pain or less in left shoulder.    Status  On-going      PT LONG TERM GOAL #3   Title  able to sleep without waking from left shoulder pain.    Status  On-going      PT LONG TERM GOAL #4   Title  increase left shoulder strength to 4+/5 or better to improve function    Status  On-going            Plan - 11/03/18 1050    Clinical Impression Statement  Pt tolerated exercises very well today and was able to increase reps. Difficulty with horizontal ABD so made it isometric and no resistance for ER.    Rehab Potential  Good    PT Frequency  2x / week    PT Duration  8 weeks    PT Treatment/Interventions  ADLs/Self Care Home Management;Cryotherapy;Electrical Stimulation;Iontophoresis 4mg /ml Dexamethasone;Therapeutic exercise;Patient/family education;Manual techniques;Moist Heat;Ultrasound;Neuromuscular re-education;Passive range of motion;Dry needling;Taping    PT Next Visit Plan  continue strengthening/ROM    PT Home Exercise Plan  wall slides flex/scaption, sidelying ER    Consulted and Agree with Plan of Care  Patient       Patient will benefit from skilled therapeutic intervention in order to improve the following deficits and impairments:  Decreased range of motion, Pain, Decreased strength, Postural dysfunction, Impaired UE functional use, Increased muscle spasms  Visit Diagnosis: Chronic left shoulder pain  Muscle weakness (generalized)  Stiffness of left shoulder joint     Problem List Patient Active Problem List    Diagnosis Date Noted  . Essential hypertension,  benign 09/03/2014  . Morbid obesity ([place]) 03/06/2014  . HTN (hypertension) 03/06/2014  . Vertigo 03/06/2014  . Type II or unspecified type diabetes mellitus without mention of complication, uncontrolled 03/06/2014  . Pure hypercholesterolemia 03/06/2014    Madelyn Flavors PT 11/03/2018, 10:53 AM  [place], [place], [address] Phone: [phone]   Fax:  [phone]  Name: Jessica [name] MRN: [id number] Date of Birth: 08-18-1946

## 2018-11-07 ENCOUNTER — Ambulatory Visit: Payer: Medicare Other | Admitting: Physical Therapy

## 2018-11-08 ENCOUNTER — Ambulatory Visit: Payer: Medicare Other | Admitting: Physical Therapy

## 2018-11-08 ENCOUNTER — Encounter: Payer: Self-pay | Admitting: Physical Therapy

## 2018-11-08 DIAGNOSIS — G8929 Other chronic pain: Secondary | ICD-10-CM | POA: Diagnosis not present

## 2018-11-08 DIAGNOSIS — M25612 Stiffness of left shoulder, not elsewhere classified: Secondary | ICD-10-CM | POA: Diagnosis not present

## 2018-11-08 DIAGNOSIS — M25511 Pain in right shoulder: Secondary | ICD-10-CM | POA: Diagnosis not present

## 2018-11-08 DIAGNOSIS — M6281 Muscle weakness (generalized): Secondary | ICD-10-CM | POA: Diagnosis not present

## 2018-11-08 DIAGNOSIS — M25512 Pain in left shoulder: Principal | ICD-10-CM

## 2018-11-08 NOTE — Therapy (Signed)
[name], [place], [address] Phone: [phone]   Fax:  [phone]  Physical Therapy Treatment  Patient Details  Name: Jessica [name] MRN: [id number] Date of Birth: [birth year] Referring Provider (PT): Frederik Pear MD   Encounter Date: 11/08/2018  PT End of Session - 11/08/18 1425    Visit Number  4    Date for PT Re-Evaluation  12/25/18    Authorization Type  KX    PT Start Time  1345    PT Stop Time  1425    PT Time Calculation (min)  40 min    Activity Tolerance  Patient tolerated treatment well    Behavior During Therapy  [facility] for tasks assessed/performed       Past Medical History:  Diagnosis Date  . Anemia   . Anxiety   . Arthritis   . Cataract   . Depression   . Diabetes mellitus without complication ([place])   . Diastolic dysfunction   . Frozen shoulder   . Glaucoma   . Headache    History of migraines  . Heart murmur   . History of kidney stones   . Hyperlipidemia   . Hypertension   . Macular degeneration   . Neuropathy   . Obesity   . Sleep apnea   . Stroke [facility])    has been told by a Dr. Mort Sawyers that she may have had a mini stroke but no further testing was done to confirm  . Tachycardia     Past Surgical History:  Procedure Laterality Date  . CARDIAC CATHETERIZATION     -6/09-normal ejection fraction 65%, no angiographically significant CAD., M. Skains MD  . CESAREAN SECTION    . COLONOSCOPY    . foot spurs removed    . HYSTEROSCOPY W/D&C N/A 06/28/2017   Procedure: DILATATION AND CURETTAGE /HYSTEROSCOPY/POLYPECTOMY WITH MYOSURE;  Surgeon: Christophe Louis, MD;  Location: [facility];  Service: Gynecology;  Laterality: N/A;  . JOINT REPLACEMENT     right knee replaced  . knee spurs removed    . shoulder spurs removed    . TONSILLECTOMY AND ADENOIDECTOMY    . UPPER GI ENDOSCOPY      There were no vitals filed for this visit.  Subjective Assessment - 11/08/18 1348     Subjective  "My shoulders are a lot better, it is not keeping me up at night"    Currently in Pain?  Yes    Pain Score  7     Pain Location  Shoulder    Pain Orientation  Left                       [facility] Adult PT Treatment/Exercise - 11/08/18 0001      Shoulder Exercises: Seated   Extension  Strengthening;Both;20 reps;Theraband    Theraband Level (Shoulder Extension)  Level 2 (Red)    Row  Strengthening;Both;20 reps;10 reps;Theraband    Theraband Level (Shoulder Row)  Level 2 (Red);Level 3 (Green)    Horizontal ABduction Limitations  Pain LUE    External Rotation  Strengthening;Both;Left;Right;Theraband    Theraband Level (Shoulder External Rotation)  Level 1 (Yellow)    Internal Rotation  Strengthening;Left;Right;Theraband;20 reps    Theraband Level (Shoulder Internal Rotation)  Level 1 (Yellow)    Abduction  Left;10 reps   isometric   Other Seated Exercises  cane ex flex, IR, ,abd and chest press 10 each  Other Seated Exercises  bicep curls 2lb cane 2x10; Tricep ext yellow tband 2x10       Shoulder Exercises: ROM/Strengthening   UBE (Upper Arm Bike)  L 2 2 fwd/2 back               PT Short Term Goals - 11/03/18 1049      PT SHORT TERM GOAL #1   Title  independent with intial HEP    Time  2    Period  Weeks    Status  On-going        PT Long Term Goals - 11/03/18 1049      PT LONG TERM GOAL #1   Status  On-going      PT LONG TERM GOAL #2   Title  able to peform ADLs with 2/10 pain or less in left shoulder.    Status  On-going      PT LONG TERM GOAL #3   Title  able to sleep without waking from left shoulder pain.    Status  On-going      PT LONG TERM GOAL #4   Title  increase left shoulder strength to 4+/5 or better to improve function    Status  On-going            Plan - 11/08/18 1426    Clinical Impression Statement  Pt able to complete all of today's exercises. She does have some pain with horizontal abduction. L  shoulder ADB with isometric hold. No issues with curls and extensions.     Rehab Potential  Good    PT Frequency  2x / week    PT Duration  8 weeks    PT Treatment/Interventions  ADLs/Self Care Home Management;Cryotherapy;Electrical Stimulation;Iontophoresis 4mg /ml Dexamethasone;Therapeutic exercise;Patient/family education;Manual techniques;Moist Heat;Ultrasound;Neuromuscular re-education;Passive range of motion;Dry needling;Taping    PT Next Visit Plan  continue strengthening/ROM       Patient will benefit from skilled therapeutic intervention in order to improve the following deficits and impairments:  Decreased range of motion, Pain, Decreased strength, Postural dysfunction, Impaired UE functional use, Increased muscle spasms  Visit Diagnosis: Chronic left shoulder pain  Muscle weakness (generalized)  Stiffness of left shoulder joint     Problem List Patient Active Problem List   Diagnosis Date Noted  . Essential hypertension, benign 09/03/2014  . Morbid obesity ([place]) 03/06/2014  . HTN (hypertension) 03/06/2014  . Vertigo 03/06/2014  . Type II or unspecified type diabetes mellitus without mention of complication, uncontrolled 03/06/2014  . Pure hypercholesterolemia 03/06/2014    Scot Jun, PTA 11/08/2018, 2:29 PM  [place], [place], [address] Phone: [phone]   Fax:  [phone]  Name: Jessica [name] MRN: [id number] Date of Birth: Mar 11, 1946

## 2018-11-09 ENCOUNTER — Ambulatory Visit: Payer: Medicare Other | Admitting: Physical Therapy

## 2018-11-09 DIAGNOSIS — M25511 Pain in right shoulder: Secondary | ICD-10-CM

## 2018-11-09 DIAGNOSIS — G8929 Other chronic pain: Secondary | ICD-10-CM | POA: Diagnosis not present

## 2018-11-09 DIAGNOSIS — M25512 Pain in left shoulder: Principal | ICD-10-CM

## 2018-11-09 DIAGNOSIS — M6281 Muscle weakness (generalized): Secondary | ICD-10-CM | POA: Diagnosis not present

## 2018-11-09 DIAGNOSIS — M25612 Stiffness of left shoulder, not elsewhere classified: Secondary | ICD-10-CM

## 2018-11-09 NOTE — Therapy (Signed)
[place] [address] [place], [place], [address] Phone: [phone]   Fax:  [phone]  Physical Therapy Treatment  Patient Details  Name: Jessica [name] MRN: [id number] Date of Birth: 1946/04/06 Referring Provider (PT): Frederik Pear MD   Encounter Date: 11/09/2018  PT End of Session - 11/09/18 0938    Visit Number  5    Date for PT Re-Evaluation  12/25/18    Authorization Type  KX    PT Start Time  0935    PT Stop Time  1028    PT Time Calculation (min)  53 min    Activity Tolerance  Patient tolerated treatment well    Behavior During Therapy  [facility] for tasks assessed/performed       Past Medical History:  Diagnosis Date  . Anemia   . Anxiety   . Arthritis   . Cataract   . Depression   . Diabetes mellitus without complication ([place])   . Diastolic dysfunction   . Frozen shoulder   . Glaucoma   . Headache    History of migraines  . Heart murmur   . History of kidney stones   . Hyperlipidemia   . Hypertension   . Macular degeneration   . Neuropathy   . Obesity   . Sleep apnea   . Stroke [facility])    has been told by a Dr. Mort Sawyers that she may have had a mini stroke but no further testing was done to confirm  . Tachycardia     Past Surgical History:  Procedure Laterality Date  . CARDIAC CATHETERIZATION     -6/09-normal ejection fraction 65%, no angiographically significant CAD., M. Skains MD  . CESAREAN SECTION    . COLONOSCOPY    . foot spurs removed    . HYSTEROSCOPY W/D&C N/A 06/28/2017   Procedure: DILATATION AND CURETTAGE /HYSTEROSCOPY/POLYPECTOMY WITH MYOSURE;  Surgeon: Christophe Louis, MD;  Location: [facility];  Service: Gynecology;  Laterality: N/A;  . JOINT REPLACEMENT     right knee replaced  . knee spurs removed    . shoulder spurs removed    . TONSILLECTOMY AND ADENOIDECTOMY    . UPPER GI ENDOSCOPY      There were no vitals filed for this visit.  Subjective Assessment - 11/09/18 0938     Subjective  Patient reports no pain today. She will get a sharp pain with certain movements.    Pertinent History  left shoulder manipulation 20  years ago, R TKR, anxiety/depression, DM    Limitations  Lifting;Walking;Standing;House hold activities    Diagnostic tests  xrays bone spurs    Patient Stated Goals  decrease pain, have better motions and better care for her 85 year old dad    Currently in Pain?  No/denies         [facility] PT Assessment - 11/09/18 0001      Strength   Left Shoulder Flexion  3/5    Left Shoulder ABduction  3+/5    Left Shoulder Internal Rotation  4+/5    Left Shoulder External Rotation  4-/5                   [facility] Adult PT Treatment/Exercise - 11/09/18 0001      Shoulder Exercises: Seated   Extension  Strengthening;Both;20 reps;Theraband    Theraband Level (Shoulder Extension)  Level 2 (Red)    Row  Strengthening;Both;20 reps;10 reps;Theraband    Theraband Level (Shoulder  Row)  Level 2 (Red);Level 3 (Green)    Horizontal ABduction  Strengthening;Right;10 reps;Theraband    Theraband Level (Shoulder Horizontal ABduction)  Level 1 (Yellow)    Horizontal ABduction Limitations  no pain with wt    External Rotation  Strengthening;Both;20 reps;Weights    External Rotation Weight (lbs)  2    Internal Rotation  Strengthening;Both;20 reps;Theraband    Theraband Level (Shoulder Internal Rotation)  Level 2 (Red)    Flexion  Strengthening;Left;20 reps    Diagonals  Strengthening;Left;20 reps   scaption   Other Seated Exercises  cane ex flex, ext, IR, ,abd and chest press 2x10 each    Other Seated Exercises  biceps curls 2# 2x10 and hammer curts      Shoulder Exercises: ROM/Strengthening   UBE (Upper Arm Bike)  L2.5 x 5 min fwd/bwd 2.5 min      Modalities   Modalities  Electrical Stimulation;Moist Heat      Moist Heat Therapy   Number Minutes Moist Heat  15 Minutes    Moist Heat Location  Shoulder      Electrical Stimulation   Electrical  Stimulation Location  left shoulder    Electrical Stimulation Action  IFC     Electrical Stimulation Parameters  sitting    Electrical Stimulation Goals  Pain               PT Short Term Goals - 11/03/18 1049      PT SHORT TERM GOAL #1   Title  independent with intial HEP    Time  2    Period  Weeks    Status  On-going        PT Long Term Goals - 11/09/18 1018      PT LONG TERM GOAL #1   Title  Improved Lt shoulder flexion to 155 degrees or better to improve function.    Time  8    Period  Weeks    Status  On-going      PT LONG TERM GOAL #2   Title  able to peform ADLs with 2/10 pain or less in left shoulder.    Baseline  6-7/10 with ADLS    Time  8    Period  Weeks      PT LONG TERM GOAL #3   Title  able to sleep without waking from left shoulder pain.    Time  8    Period  Weeks    Status  On-going      PT LONG TERM GOAL #4   Title  increase left shoulder strength to 4+/5 or better to improve function    Time  8    Period  Weeks    Status  On-going            Plan - 11/09/18 1036    Clinical Impression Statement  Pt presented with no pain but experienced continued pain with horizontal ABD and ER using Tband. She was able to tolerate ER with weight better. She is making gains in strength.     Rehab Potential  Good    PT Frequency  2x / week    PT Duration  8 weeks    PT Treatment/Interventions  ADLs/Self Care Home Management;Cryotherapy;Electrical Stimulation;Iontophoresis 4mg /ml Dexamethasone;Therapeutic exercise;Patient/family education;Manual techniques;Moist Heat;Ultrasound;Neuromuscular re-education;Passive range of motion;Dry needling;Taping    PT Next Visit Plan  continue strengthening/ROM    Consulted and Agree with Plan of Care  Patient       Patient will benefit  from skilled therapeutic intervention in order to improve the following deficits and impairments:  Decreased range of motion, Pain, Decreased strength, Postural dysfunction,  Impaired UE functional use, Increased muscle spasms  Visit Diagnosis: Chronic left shoulder pain  Muscle weakness (generalized)  Stiffness of left shoulder joint  Acute pain of right shoulder     Problem List Patient Active Problem List   Diagnosis Date Noted  . Essential hypertension, benign 09/03/2014  . Morbid obesity ([place]) 03/06/2014  . HTN (hypertension) 03/06/2014  . Vertigo 03/06/2014  . Type II or unspecified type diabetes mellitus without mention of complication, uncontrolled 03/06/2014  . Pure hypercholesterolemia 03/06/2014    Madelyn Flavors PT 11/09/2018, 10:42 AM  [place], [place], [address] Phone: [phone]   Fax:  [phone]  Name: Jessica [name] MRN: [id number] Date of Birth: 1946-04-18

## 2018-11-13 ENCOUNTER — Ambulatory Visit: Payer: Medicare Other | Admitting: Physical Therapy

## 2018-11-13 ENCOUNTER — Encounter: Payer: Self-pay | Admitting: Physical Therapy

## 2018-11-13 DIAGNOSIS — M25511 Pain in right shoulder: Secondary | ICD-10-CM | POA: Diagnosis not present

## 2018-11-13 DIAGNOSIS — M25612 Stiffness of left shoulder, not elsewhere classified: Secondary | ICD-10-CM

## 2018-11-13 DIAGNOSIS — M6281 Muscle weakness (generalized): Secondary | ICD-10-CM

## 2018-11-13 DIAGNOSIS — G8929 Other chronic pain: Secondary | ICD-10-CM | POA: Diagnosis not present

## 2018-11-13 DIAGNOSIS — M25512 Pain in left shoulder: Secondary | ICD-10-CM | POA: Diagnosis not present

## 2018-11-13 NOTE — Therapy (Signed)
[place] [address] [place], [place], [address] Phone: [phone]   Fax:  [phone]  Physical Therapy Treatment  Patient Details  Name: Jessica [name] MRN: [id number] Date of Birth: [birth year] Referring Provider (PT): Frederik Pear MD   Encounter Date: 11/13/2018  PT End of Session - 11/13/18 1046    Visit Number  6    Date for PT Re-Evaluation  12/25/18    Authorization Type  KX    PT Start Time  1000    PT Stop Time  1058    PT Time Calculation (min)  58 min    Activity Tolerance  Patient tolerated treatment well;Patient limited by pain    Behavior During Therapy  [facility] for tasks assessed/performed       Past Medical History:  Diagnosis Date  . Anemia   . Anxiety   . Arthritis   . Cataract   . Depression   . Diabetes mellitus without complication ([place])   . Diastolic dysfunction   . Frozen shoulder   . Glaucoma   . Headache    History of migraines  . Heart murmur   . History of kidney stones   . Hyperlipidemia   . Hypertension   . Macular degeneration   . Neuropathy   . Obesity   . Sleep apnea   . Stroke [facility])    has been told by a Dr. Mort Sawyers that she may have had a mini stroke but no further testing was done to confirm  . Tachycardia     Past Surgical History:  Procedure Laterality Date  . CARDIAC CATHETERIZATION     -6/09-normal ejection fraction 65%, no angiographically significant CAD., M. Skains MD  . CESAREAN SECTION    . COLONOSCOPY    . foot spurs removed    . HYSTEROSCOPY W/D&C N/A 06/28/2017   Procedure: DILATATION AND CURETTAGE /HYSTEROSCOPY/POLYPECTOMY WITH MYOSURE;  Surgeon: Christophe Louis, MD;  Location: [facility];  Service: Gynecology;  Laterality: N/A;  . JOINT REPLACEMENT     right knee replaced  . knee spurs removed    . shoulder spurs removed    . TONSILLECTOMY AND ADENOIDECTOMY    . UPPER GI ENDOSCOPY      There were no vitals filed for this visit.  Subjective  Assessment - 11/13/18 1002    Subjective  Pt report that her pain has improved some, but functionally it does not seem to be getting better    Currently in Pain?  Yes    Pain Score  4     Pain Location  --   "Between neck and shoulder"   Pain Orientation  Left         [facility] PT Assessment - 11/13/18 0001      PROM   Left Shoulder Flexion  96 Degrees    Left Shoulder ABduction  60 Degrees    Left Shoulder Internal Rotation  43 Degrees                   [facility] Adult PT Treatment/Exercise - 11/13/18 0001      Shoulder Exercises: Seated   Extension  Strengthening;Both;Theraband;15 reps   x2   Theraband Level (Shoulder Extension)  Level 2 (Red)    Row  Strengthening;Both;10 reps;Theraband;15 reps   x2   Theraband Level (Shoulder Row)  Level 3 (Green)    Horizontal ABduction  Strengthening;Theraband;Both;15 reps    Theraband Level (Shoulder Horizontal ABduction)  Level  1 (Yellow)    External Rotation  Strengthening;Both;20 reps;Weights    Theraband Level (Shoulder External Rotation)  Level 1 (Yellow)    Internal Rotation  Strengthening;Both;20 reps;Theraband    Theraband Level (Shoulder Internal Rotation)  Level 2 (Red)    Flexion  Strengthening;Left;20 reps    Other Seated Exercises  flex, chest press 2x10 each      Shoulder Exercises: ROM/Strengthening   UBE (Upper Arm Bike)  L2.5 x 5 min fwd/bwd 2.5 min      Modalities   Modalities  Electrical Stimulation;Moist Heat      Moist Heat Therapy   Number Minutes Moist Heat  15 Minutes    Moist Heat Location  Shoulder      Electrical Stimulation   Electrical Stimulation Location  left shoulder    Electrical Stimulation Action  IFC    Electrical Stimulation Parameters  sittiing    Electrical Stimulation Goals  Pain      Manual Therapy   Manual Therapy  Passive ROM    Manual therapy comments  in sitting    Passive ROM  Flex, ER and ABD               PT Short Term Goals - 11/03/18 1049      PT SHORT  TERM GOAL #1   Title  independent with intial HEP    Time  2    Period  Weeks    Status  On-going        PT Long Term Goals - 11/13/18 1050      PT LONG TERM GOAL #1   Title  Improved Lt shoulder flexion to 155 degrees or better to improve function.    Status  On-going      PT LONG TERM GOAL #2   Title  able to peform ADLs with 2/10 pain or less in left shoulder.    Status  On-going      PT LONG TERM GOAL #3   Title  able to sleep without waking from left shoulder pain.    Status  On-going            Plan - 11/13/18 1047    Clinical Impression Statement  Pt with a small increase in L shoulder ROM. She was able for the first time to do horizontal abd with the LUE but with limited ROM and little pain. She was able to achieve a greater ROM with LUE with cane. Pt very guarded with MT requiring multiple cues to relax. Pt reports a painful catch when bringing LUE down from extension/.    Rehab Potential  Good    PT Frequency  2x / week    PT Duration  8 weeks    PT Treatment/Interventions  ADLs/Self Care Home Management;Cryotherapy;Electrical Stimulation;Iontophoresis 4mg /ml Dexamethasone;Therapeutic exercise;Patient/family education;Manual techniques;Moist Heat;Ultrasound;Neuromuscular re-education;Passive range of motion;Dry needling;Taping    PT Next Visit Plan  continue strengthening/ROM       Patient will benefit from skilled therapeutic intervention in order to improve the following deficits and impairments:  Decreased range of motion, Pain, Decreased strength, Postural dysfunction, Impaired UE functional use, Increased muscle spasms  Visit Diagnosis: Chronic left shoulder pain  Muscle weakness (generalized)  Stiffness of left shoulder joint     Problem List Patient Active Problem List   Diagnosis Date Noted  . Essential hypertension, benign 09/03/2014  . Morbid obesity ([place]) 03/06/2014  . HTN (hypertension) 03/06/2014  . Vertigo 03/06/2014  . Type II or  unspecified type diabetes  mellitus without mention of complication, uncontrolled 03/06/2014  . Pure hypercholesterolemia 03/06/2014    Scot Jun, PTA 11/13/2018, 10:51 AM  [place], [place], [address] Phone: [phone]   Fax:  [phone]  Name: Jessica [name] MRN: [id number] Date of Birth: 06/16/1946

## 2018-11-16 ENCOUNTER — Ambulatory Visit: Payer: Medicare Other | Admitting: Physical Therapy

## 2018-11-16 DIAGNOSIS — M25511 Pain in right shoulder: Secondary | ICD-10-CM | POA: Diagnosis not present

## 2018-11-16 DIAGNOSIS — M25512 Pain in left shoulder: Secondary | ICD-10-CM | POA: Diagnosis not present

## 2018-11-16 DIAGNOSIS — M6281 Muscle weakness (generalized): Secondary | ICD-10-CM

## 2018-11-16 DIAGNOSIS — M25612 Stiffness of left shoulder, not elsewhere classified: Secondary | ICD-10-CM

## 2018-11-16 DIAGNOSIS — G8929 Other chronic pain: Secondary | ICD-10-CM | POA: Diagnosis not present

## 2018-11-16 NOTE — Therapy (Signed)
[place], [place], [address] Phone: [phone]   Fax:  [phone]  Physical Therapy Treatment  Patient Details  Name: Jessica [name] MRN: [id number] Date of Birth: 07-May-1946 Referring Provider (PT): Frederik Pear MD   Encounter Date: 11/16/2018  PT End of Session - 11/16/18 1025    Visit Number  7    Date for PT Re-Evaluation  12/25/18    Authorization Type  KX    PT Start Time  1022    PT Stop Time  1114    PT Time Calculation (min)  52 min    Activity Tolerance  Patient tolerated treatment well;Patient limited by pain    Behavior During Therapy  [facility] for tasks assessed/performed       Past Medical History:  Diagnosis Date  . Anemia   . Anxiety   . Arthritis   . Cataract   . Depression   . Diabetes mellitus without complication ([place])   . Diastolic dysfunction   . Frozen shoulder   . Glaucoma   . Headache    History of migraines  . Heart murmur   . History of kidney stones   . Hyperlipidemia   . Hypertension   . Macular degeneration   . Neuropathy   . Obesity   . Sleep apnea   . Stroke [facility])    has been told by a Dr. Mort Sawyers that she may have had a mini stroke but no further testing was done to confirm  . Tachycardia     Past Surgical History:  Procedure Laterality Date  . CARDIAC CATHETERIZATION     -6/09-normal ejection fraction 65%, no angiographically significant CAD., M. Skains MD  . CESAREAN SECTION    . COLONOSCOPY    . foot spurs removed    . HYSTEROSCOPY W/D&C N/A 06/28/2017   Procedure: DILATATION AND CURETTAGE /HYSTEROSCOPY/POLYPECTOMY WITH MYOSURE;  Surgeon: Christophe Louis, MD;  Location: [place] [facility];  Service: Gynecology;  Laterality: N/A;  . JOINT REPLACEMENT     right knee replaced  . knee spurs removed    . shoulder spurs removed    . TONSILLECTOMY AND ADENOIDECTOMY    . UPPER GI ENDOSCOPY      There were no vitals filed for this visit.  Subjective  Assessment - 11/16/18 1025    Subjective  Patient says her left shoulder is hurting more today, maybe because she is tired from company.    Pertinent History  left shoulder manipulation 20  years ago, R TKR, anxiety/depression, DM    Limitations  Lifting;Walking;Standing;House hold activities    Diagnostic tests  xrays bone spurs    Patient Stated Goals  decrease pain, have better motions and better care for her 31 year old dad    Currently in Pain?  Yes    Pain Score  8     Pain Location  Shoulder    Pain Orientation  Left    Pain Descriptors / Indicators  Aching;Sore                       [facility] Adult PT Treatment/Exercise - 11/16/18 0001      Shoulder Exercises: Seated   Extension  Strengthening;Both;Theraband;15 reps   x2   Theraband Level (Shoulder Extension)  Level 2 (Red)    Row  Strengthening;Both;10 reps;Theraband;15 reps   x2   Theraband Level (Shoulder Row)  Level 3 (Green)    Horizontal  ABduction  Strengthening;Theraband;Both;15 reps    Theraband Level (Shoulder Horizontal ABduction)  Level 1 (Yellow)    External Rotation  Strengthening;Both;20 reps;Weights    Theraband Level (Shoulder External Rotation)  Level 1 (Yellow)    Internal Rotation  Strengthening;Both;20 reps;Theraband    Flexion  Strengthening;Left;20 reps    Abduction  Strengthening;Left;15 reps    Other Seated Exercises  flex, chest press 2x10 each      Shoulder Exercises: Standing   Flexion  AAROM;5 reps;Left      Shoulder Exercises: ROM/Strengthening   UBE (Upper Arm Bike)  L2 x 6 min fwd/rev      Modalities   Modalities  Electrical Stimulation;Moist Heat      Moist Heat Therapy   Number Minutes Moist Heat  15 Minutes    Moist Heat Location  Shoulder      Electrical Stimulation   Electrical Stimulation Location  left shoulder    Electrical Stimulation Action  IFC    Electrical Stimulation Parameters  sitting    Electrical Stimulation Goals  Pain               PT  Short Term Goals - 11/03/18 1049      PT SHORT TERM GOAL #1   Title  independent with intial HEP    Time  2    Period  Weeks    Status  On-going        PT Long Term Goals - 11/13/18 1050      PT LONG TERM GOAL #1   Title  Improved Lt shoulder flexion to 155 degrees or better to improve function.    Status  On-going      PT LONG TERM GOAL #2   Title  able to peform ADLs with 2/10 pain or less in left shoulder.    Status  On-going      PT LONG TERM GOAL #3   Title  able to sleep without waking from left shoulder pain.    Status  On-going            Plan - 11/16/18 1613    Clinical Impression Statement  Patient did well with TE today despite reporting fatigue and pain. She has been non compliant with wall slides for flexion so was encouraged to do these daily.    PT Treatment/Interventions  ADLs/Self Care Home Management;Cryotherapy;Electrical Stimulation;Iontophoresis 4mg /ml Dexamethasone;Therapeutic exercise;Patient/family education;Manual techniques;Moist Heat;Ultrasound;Neuromuscular re-education;Passive range of motion;Dry needling;Taping    PT Next Visit Plan  continue strengthening/ROM       Patient will benefit from skilled therapeutic intervention in order to improve the following deficits and impairments:  Decreased range of motion, Pain, Decreased strength, Postural dysfunction, Impaired UE functional use, Increased muscle spasms  Visit Diagnosis: Chronic left shoulder pain  Muscle weakness (generalized)  Stiffness of left shoulder joint     Problem List Patient Active Problem List   Diagnosis Date Noted  . Essential hypertension, benign 09/03/2014  . Morbid obesity ([place]) 03/06/2014  . HTN (hypertension) 03/06/2014  . Vertigo 03/06/2014  . Type II or unspecified type diabetes mellitus without mention of complication, uncontrolled 03/06/2014  . Pure hypercholesterolemia 03/06/2014    Madelyn Flavors PT 11/16/2018, 4:15 PM  [place], [place], [address] Phone: [phone]   Fax:  [phone]  Name: Jessica [name] MRN: [id number] Date of Birth: Mar 31, 1946

## 2018-11-20 ENCOUNTER — Ambulatory Visit: Payer: Medicare Other | Admitting: Physical Therapy

## 2018-11-20 DIAGNOSIS — M25612 Stiffness of left shoulder, not elsewhere classified: Secondary | ICD-10-CM

## 2018-11-20 DIAGNOSIS — M25512 Pain in left shoulder: Secondary | ICD-10-CM | POA: Diagnosis not present

## 2018-11-20 DIAGNOSIS — M6281 Muscle weakness (generalized): Secondary | ICD-10-CM | POA: Diagnosis not present

## 2018-11-20 DIAGNOSIS — G8929 Other chronic pain: Secondary | ICD-10-CM

## 2018-11-20 DIAGNOSIS — M25511 Pain in right shoulder: Secondary | ICD-10-CM | POA: Diagnosis not present

## 2018-11-20 NOTE — Therapy (Signed)
[place], [place], [address] Phone: [phone]   Fax:  [phone]  Physical Therapy Treatment  Patient Details  Name: Jessica [name] MRN: [id number] Date of Birth: December 26, 1945 Referring Provider (PT): Frederik Pear MD   Encounter Date: 11/20/2018  PT End of Session - 11/20/18 1017    Visit Number  8    Date for PT Re-Evaluation  12/25/18    Authorization Type  KX    PT Start Time  1015    PT Stop Time  1111    PT Time Calculation (min)  56 min    Activity Tolerance  Patient tolerated treatment well;Patient limited by pain    Behavior During Therapy  [facility] for tasks assessed/performed       Past Medical History:  Diagnosis Date  . Anemia   . Anxiety   . Arthritis   . Cataract   . Depression   . Diabetes mellitus without complication ([place])   . Diastolic dysfunction   . Frozen shoulder   . Glaucoma   . Headache    History of migraines  . Heart murmur   . History of kidney stones   . Hyperlipidemia   . Hypertension   . Macular degeneration   . Neuropathy   . Obesity   . Sleep apnea   . Stroke [facility])    has been told by a Dr. Mort Sawyers that she may have had a mini stroke but no further testing was done to confirm  . Tachycardia     Past Surgical History:  Procedure Laterality Date  . CARDIAC CATHETERIZATION     -6/09-normal ejection fraction 65%, no angiographically significant CAD., M. Skains MD  . CESAREAN SECTION    . COLONOSCOPY    . foot spurs removed    . HYSTEROSCOPY W/D&C N/A 06/28/2017   Procedure: DILATATION AND CURETTAGE /HYSTEROSCOPY/POLYPECTOMY WITH MYOSURE;  Surgeon: Christophe Louis, MD;  Location: [facility];  Service: Gynecology;  Laterality: N/A;  . JOINT REPLACEMENT     right knee replaced  . knee spurs removed    . shoulder spurs removed    . TONSILLECTOMY AND ADENOIDECTOMY    . UPPER GI ENDOSCOPY      There were no vitals filed for this visit.  Subjective  Assessment - 11/20/18 1018    Subjective  Patient reports she used ice to her shoulder on Friday and had no pain on Saturday.    Pertinent History  left shoulder manipulation 20  years ago, R TKR, anxiety/depression, DM    Limitations  Lifting;Walking;Standing;House hold activities    Diagnostic tests  xrays bone spurs    Patient Stated Goals  decrease pain, have better motions and better care for her 51 year old dad    Currently in Pain?  Yes    Pain Score  5     Pain Location  Shoulder    Pain Orientation  Left    Pain Descriptors / Indicators  Aching;Sore    Pain Type  Acute pain    Pain Onset  More than a month ago    Pain Frequency  Constant         [facility] PT Assessment - 11/20/18 0001      AROM   Left Shoulder Flexion  95 Degrees   seated   Left Shoulder ABduction  75 Degrees    Left Shoulder Internal Rotation  --   able to reach behind back  Left Shoulder External Rotation  55 Degrees   seated     PROM   Left Shoulder Flexion  155 Degrees    Left Shoulder ABduction  116 Degrees      Strength   Left Shoulder Flexion  3+/5    Left Shoulder Extension  5/5    Left Shoulder ABduction  2+/5    Left Shoulder Internal Rotation  5/5    Left Shoulder External Rotation  4+/5                   [facility] Adult PT Treatment/Exercise - 11/20/18 0001      Shoulder Exercises: Seated   Extension  Strengthening;Both;Theraband;15 reps   x2   Theraband Level (Shoulder Extension)  Level 3 (Green)    Row  Strengthening;Both;10 reps;Theraband;15 reps   x2   Theraband Level (Shoulder Row)  Level 3 (Green)    Internal Rotation  Strengthening;Both;20 reps;Theraband    Theraband Level (Shoulder Internal Rotation)  Level 3 (Green)      Shoulder Exercises: Sidelying   External Rotation  Strengthening;Left;20 reps    External Rotation Weight (lbs)  1    Flexion  Strengthening;Left;20 reps;Weights    Flexion Weight (lbs)  1    ABduction  Strengthening;Left;20 reps;Weights     ABduction Weight (lbs)  1    ABduction Limitations  wants to roll back to compensate; last set with no wt.      Shoulder Exercises: ROM/Strengthening   UBE (Upper Arm Bike)  L3 x 6 min      Modalities   Modalities  Electrical Stimulation;Cryotherapy      Cryotherapy   Number Minutes Cryotherapy  15 Minutes    Cryotherapy Location  Shoulder    Type of Cryotherapy  Ice pack      Electrical Stimulation   Electrical Stimulation Location  left shoulder    Electrical Stimulation Action  IFC    Electrical Stimulation Parameters  sitting    Electrical Stimulation Goals  Pain               PT Short Term Goals - 11/20/18 1021      PT SHORT TERM GOAL #1   Title  independent with intial HEP    Time  2    Period  Weeks    Status  Achieved        PT Long Term Goals - 11/20/18 1031      PT LONG TERM GOAL #1   Title  Improved Lt shoulder flexion to 155 degrees or better to improve function.    Time  8    Period  Weeks    Status  On-going      PT LONG TERM GOAL #2   Title  able to peform ADLs with 2/10 pain or less in left shoulder.    Time  8    Period  Weeks    Status  On-going      PT LONG TERM GOAL #3   Title  able to sleep without waking from left shoulder pain.    Baseline  5 hours    Time  8    Period  Weeks    Status  Achieved      PT LONG TERM GOAL #4   Title  increase left shoulder strength to 4+/5 or better to improve function    Time  8    Period  Weeks    Status  On-going      PT  LONG TERM GOAL #5   Title  improved Left shoulder ER to allow patient to easily reach behind head.    Baseline  able to wash behind her ear    Time  8    Period  Weeks    Status  Partially Met            Plan - 11/20/18 1151    Clinical Impression Statement  Patient is progressing with ROM and strength. She did well with SDLY exercises and should continue these to get full ROM strengthening.     PT Frequency  2x / week    PT Duration  8 weeks    PT  Treatment/Interventions  ADLs/Self Care Home Management;Cryotherapy;Electrical Stimulation;Iontophoresis 40m/ml Dexamethasone;Therapeutic exercise;Patient/family education;Manual techniques;Moist Heat;Ultrasound;Neuromuscular re-education;Passive range of motion;Dry needling;Taping    PT Next Visit Plan  continue strengthening/ROM       Patient will benefit from skilled therapeutic intervention in order to improve the following deficits and impairments:  Decreased range of motion, Pain, Decreased strength, Postural dysfunction, Impaired UE functional use, Increased muscle spasms  Visit Diagnosis: Chronic left shoulder pain  Muscle weakness (generalized)  Stiffness of left shoulder joint     Problem List Patient Active Problem List   Diagnosis Date Noted  . Essential hypertension, benign 09/03/2014  . Morbid [place] ([place] 03/06/2014  . HTN (hypertension) 03/06/2014  . Vertigo 03/06/2014  . Type II or unspecified type diabetes mellitus without mention of complication, uncontrolled 03/06/2014  . Pure hypercholesterolemia 03/06/2014    JGrant FontanaPT 11/20/2018, 11:55 AM  [place] [address]Phone: [phone]  Fax:  [phone] Name: Jessica DELANCEYMRN: [id number] of Birth: [birth year]

## 2018-11-23 ENCOUNTER — Ambulatory Visit: Payer: Medicare Other | Admitting: Physical Therapy

## 2018-11-23 DIAGNOSIS — G8929 Other chronic pain: Secondary | ICD-10-CM

## 2018-11-23 DIAGNOSIS — M25612 Stiffness of left shoulder, not elsewhere classified: Secondary | ICD-10-CM

## 2018-11-23 DIAGNOSIS — M25512 Pain in left shoulder: Secondary | ICD-10-CM | POA: Diagnosis not present

## 2018-11-23 DIAGNOSIS — M6281 Muscle weakness (generalized): Secondary | ICD-10-CM | POA: Diagnosis not present

## 2018-11-23 DIAGNOSIS — M25511 Pain in right shoulder: Secondary | ICD-10-CM | POA: Diagnosis not present

## 2018-11-23 NOTE — Therapy (Signed)
[place], [place], [address] Phone: [phone]   Fax:  [phone]  Physical Therapy Treatment  Patient Details  Name: Jessica [name] MRN: [id number] Date of Birth: Sep 13, 1946 Referring Provider (PT): Jessica Pear MD   Encounter Date: 11/23/2018  PT End of Session - 11/23/18 1020    Visit Number  9    Date for PT Re-Evaluation  12/25/18    Authorization Type  KX    PT Start Time  1015    PT Stop Time  1055    PT Time Calculation (min)  40 min    Activity Tolerance  Patient tolerated treatment well;Patient limited by pain    Behavior During Therapy  [facility] for tasks assessed/performed       Past Medical History:  Diagnosis Date  . Anemia   . Anxiety   . Arthritis   . Cataract   . Depression   . Diabetes mellitus without complication ([place])   . Diastolic dysfunction   . Frozen shoulder   . Glaucoma   . Headache    History of migraines  . Heart murmur   . History of kidney stones   . Hyperlipidemia   . Hypertension   . Macular degeneration   . Neuropathy   . Obesity   . Sleep apnea   . Stroke [facility])    has been told by a Dr. Mort [name] that she may have had a mini stroke but no further testing was done to confirm  . Tachycardia     Past Surgical History:  Procedure Laterality Date  . CARDIAC CATHETERIZATION     -6/09-normal ejection fraction 65%, no angiographically significant CAD., M. Skains MD  . CESAREAN SECTION    . COLONOSCOPY    . foot spurs removed    . HYSTEROSCOPY W/D&C N/A 06/28/2017   Procedure: DILATATION AND CURETTAGE /HYSTEROSCOPY/POLYPECTOMY WITH MYOSURE;  Surgeon: Jessica Louis, MD;  Location: [facility];  Service: Gynecology;  Laterality: N/A;  . JOINT REPLACEMENT     right knee replaced  . knee spurs removed    . shoulder spurs removed    . TONSILLECTOMY AND ADENOIDECTOMY    . UPPER GI ENDOSCOPY      There were no vitals filed for this visit.  Subjective  Assessment - 11/23/18 1020    Subjective  Patient continues to feel that shoulder is improving. Ice is still helping. she is doing more and more with it without thinking. I get sharp pains less and less.    Pertinent History  left shoulder manipulation 20  years ago, R TKR, anxiety/depression, DM    Limitations  Lifting;Walking;Standing;House hold activities    Diagnostic tests  xrays bone spurs    Patient Stated Goals  decrease pain, have better motions and better care for her 71 year old dad    Currently in Pain?  Yes    Pain Score  4     Pain Location  Shoulder    Pain Orientation  Left    Pain Descriptors / Indicators  Aching;Sore                       [facility] Adult PT Treatment/Exercise - 11/23/18 0001      Shoulder Exercises: Seated   Extension  Strengthening;Both;Theraband;15 reps   x2   Theraband Level (Shoulder Extension)  Level 3 (Green)    Row  Strengthening;Both;10 reps;Theraband;15 reps   x2  Theraband Level (Shoulder Row)  Level 3 (Green)    External Rotation  Strengthening;Both;20 reps;Weights    External Rotation Weight (lbs)  2    Internal Rotation  Strengthening;Both;20 reps;Theraband    Theraband Level (Shoulder Internal Rotation)  Level 3 (Green)    Other Seated Exercises  chest press x 20    Other Seated Exercises  biceps curls and hammer curls x 20 ea      Shoulder Exercises: Sidelying   External Rotation  Strengthening;Left;20 reps    External Rotation Weight (lbs)  0    Flexion  Strengthening;Left;20 reps;Weights;10 reps    Flexion Weight (lbs)  1    ABduction  Strengthening;Left;20 reps;Weights    ABduction Weight (lbs)  0    ABduction Limitations  in pain free range with elbow at 90 deg; bolster behind back    Other Sidelying Exercises  empty can x 30      Shoulder Exercises: ROM/Strengthening   UBE (Upper Arm Bike)  L3 x 6 min               PT Short Term Goals - 11/20/18 1021      PT SHORT TERM GOAL #1   Title   independent with intial HEP    Time  2    Period  Weeks    Status  Achieved        PT Long Term Goals - 11/20/18 1031      PT LONG TERM GOAL #1   Title  Improved Lt shoulder flexion to 155 degrees or better to improve function.    Time  8    Period  Weeks    Status  On-going      PT LONG TERM GOAL #2   Title  able to peform ADLs with 2/10 pain or less in left shoulder.    Time  8    Period  Weeks    Status  On-going      PT LONG TERM GOAL #3   Title  able to sleep without waking from left shoulder pain.    Baseline  5 hours    Time  8    Period  Weeks    Status  Achieved      PT LONG TERM GOAL #4   Title  increase left shoulder strength to 4+/5 or better to improve function    Time  8    Period  Weeks    Status  On-going      PT LONG TERM GOAL #5   Title  improved Left shoulder ER to allow patient to easily reach behind head.    Baseline  able to wash behind her ear    Time  8    Period  Weeks    Status  Partially Met            Plan - 11/23/18 1539    Clinical Impression Statement  Patient able to increase reps today without increased pain. Denied need for modalities today.    PT Treatment/Interventions  ADLs/Self Care Home Management;Cryotherapy;Electrical Stimulation;Iontophoresis 41m/ml Dexamethasone;Therapeutic exercise;Patient/family education;Manual techniques;Moist Heat;Ultrasound;Neuromuscular re-education;Passive range of motion;Dry needling;Taping    PT Next Visit Plan  Foto/Progress note; continue strengthening/ROM    Consulted and Agree with Plan of Care  Patient       Patient will benefit from skilled therapeutic intervention in order to improve the following deficits and impairments:  Decreased range of motion, Pain, Decreased strength, Postural dysfunction, Impaired UE functional use, Increased  muscle spasms  Visit Diagnosis: Chronic left shoulder pain  Muscle weakness (generalized)  Stiffness of left shoulder joint     Problem  List Patient Active Problem List   Diagnosis Date Noted  . Essential hypertension, benign 09/03/2014  . Morbid obesity ([place]) 03/06/2014  . HTN (hypertension) 03/06/2014  . Vertigo 03/06/2014  . Type II or unspecified type diabetes mellitus without mention of complication, uncontrolled 03/06/2014  . Pure hypercholesterolemia 03/06/2014   Madelyn Flavors PT 11/23/2018, 3:41 PM  [place], [place], [address] Phone: [phone]   Fax:  [phone]  Name: Jessica [name] MRN: [id number] Date of Birth: 09/10/1946

## 2018-11-27 ENCOUNTER — Ambulatory Visit: Payer: Medicare Other | Attending: Family Medicine | Admitting: Physical Therapy

## 2018-11-27 DIAGNOSIS — M25612 Stiffness of left shoulder, not elsewhere classified: Secondary | ICD-10-CM | POA: Diagnosis not present

## 2018-11-27 DIAGNOSIS — M6281 Muscle weakness (generalized): Secondary | ICD-10-CM | POA: Insufficient documentation

## 2018-11-27 DIAGNOSIS — M25511 Pain in right shoulder: Secondary | ICD-10-CM | POA: Diagnosis not present

## 2018-11-27 DIAGNOSIS — G8929 Other chronic pain: Secondary | ICD-10-CM | POA: Insufficient documentation

## 2018-11-27 DIAGNOSIS — M25512 Pain in left shoulder: Secondary | ICD-10-CM | POA: Diagnosis not present

## 2018-11-27 NOTE — Therapy (Signed)
[place], [place], [address] Phone: [phone]   Fax:  [phone]  Physical Therapy Treatment  Patient Details  Name: Jessica [name] MRN: [id number] Date of Birth: 22-Jan-1946 Referring Provider (PT): Frederik Pear MD   Encounter Date: 11/27/2018   Progress Note Reporting Period 10/26/18 to 11/27/18  See note below for Objective Data and Assessment of Progress/Goals.       PT End of Session - 11/27/18 1101    Visit Number  10    Date for PT Re-Evaluation  12/25/18    Authorization Type  KX    PT Start Time  1100    PT Stop Time  1145    PT Time Calculation (min)  45 min    Activity Tolerance  Patient tolerated treatment well;Patient limited by pain    Behavior During Therapy  [facility] for tasks assessed/performed       Past Medical History:  Diagnosis Date  . Anemia   . Anxiety   . Arthritis   . Cataract   . Depression   . Diabetes mellitus without complication ([place])   . Diastolic dysfunction   . Frozen shoulder   . Glaucoma   . Headache    History of migraines  . Heart murmur   . History of kidney stones   . Hyperlipidemia   . Hypertension   . Macular degeneration   . Neuropathy   . Obesity   . Sleep apnea   . Stroke [facility])    has been told by a Dr. Mort Sawyers that she may have had a mini stroke but no further testing was done to confirm  . Tachycardia     Past Surgical History:  Procedure Laterality Date  . CARDIAC CATHETERIZATION     -6/09-normal ejection fraction 65%, no angiographically significant CAD., M. Skains MD  . CESAREAN SECTION    . COLONOSCOPY    . foot spurs removed    . HYSTEROSCOPY W/D&C N/A 06/28/2017   Procedure: DILATATION AND CURETTAGE /HYSTEROSCOPY/POLYPECTOMY WITH MYOSURE;  Surgeon: Christophe Louis, MD;  Location: [facility];  Service: Gynecology;  Laterality: N/A;  . JOINT REPLACEMENT     right knee replaced  . knee spurs removed    . shoulder spurs  removed    . TONSILLECTOMY AND ADENOIDECTOMY    . UPPER GI ENDOSCOPY      There were no vitals filed for this visit.  Subjective Assessment - 11/27/18 1101    Subjective  Pain level is staying the same. Able to sleep at night using biofreeze and Aleve.    Pertinent History  left shoulder manipulation 20  years ago, R TKR, anxiety/depression, DM    Limitations  Lifting;Walking;Standing;House hold activities    Diagnostic tests  xrays bone spurs    Patient Stated Goals  decrease pain, have better motions and better care for her 71 year old dad    Currently in Pain?  Yes    Pain Score  4     Pain Location  Shoulder    Pain Orientation  Left    Pain Descriptors / Indicators  Aching;Sore    Pain Type  Acute pain         [facility] PT Assessment - 11/27/18 0001      AROM   Left Shoulder Flexion  95 Degrees   seated   Left Shoulder ABduction  75 Degrees    Left Shoulder Internal Rotation  --  able to reach behind back   Left Shoulder External Rotation  55 Degrees   seated     PROM   Left Shoulder Flexion  155 Degrees    Left Shoulder ABduction  116 Degrees      Strength   Left Shoulder Flexion  3+/5    Left Shoulder Extension  5/5    Left Shoulder ABduction  2+/5    Left Shoulder Internal Rotation  5/5    Left Shoulder External Rotation  4+/5   in limited ROM                  [facility] Adult PT Treatment/Exercise - 11/27/18 0001      Shoulder Exercises: Seated   Extension  Strengthening;Both;Theraband;15 reps   x2   Theraband Level (Shoulder Extension)  Level 3 (Green)    Row  Strengthening;Both;10 reps;Theraband;15 reps   x2   Theraband Level (Shoulder Row)  Level 3 (Green)    External Rotation  Strengthening;Both;20 reps;Weights    External Rotation Weight (lbs)  2    Internal Rotation  Strengthening;Both;20 reps;Theraband    Theraband Level (Shoulder Internal Rotation)  Level 3 (Green)    Other Seated Exercises  biceps curls and hammer curls x 20 ea with 3#       Shoulder Exercises: ROM/Strengthening   UBE (Upper Arm Bike)  L4 x 8 min      Modalities   Modalities  Electrical Stimulation;Cryotherapy      Cryotherapy   Number Minutes Cryotherapy  15 Minutes    Cryotherapy Location  Shoulder    Type of Cryotherapy  Ice pack      Electrical Stimulation   Electrical Stimulation Location  left shoulder    Electrical Stimulation Action  IFC    Electrical Stimulation Parameters  sitting    Electrical Stimulation Goals  Pain               PT Short Term Goals - 11/20/18 1021      PT SHORT TERM GOAL #1   Title  independent with intial HEP    Time  2    Period  Weeks    Status  Achieved        PT Long Term Goals - 11/27/18 1132      PT LONG TERM GOAL #1   Title  Improved Lt shoulder flexion to 155 degrees or better to improve function.    Time  8    Period  Weeks    Status  On-going      PT LONG TERM GOAL #2   Title  able to peform ADLs with 2/10 pain or less in left shoulder.    Baseline  5/10 with ADLS    Time  8    Period  Weeks    Status  On-going      PT LONG TERM GOAL #3   Title  able to sleep without waking from left shoulder pain.    Time  8    Period  Weeks    Status  Achieved      PT LONG TERM GOAL #4   Title  increase left shoulder strength to 4+/5 or better to improve function    Time  8    Period  Weeks    Status  On-going      PT LONG TERM GOAL #5   Title  improved Left shoulder ER to allow patient to easily reach behind head.    Baseline  able to wash behind her ear    Time  8    Period  Weeks    Status  Partially Met            Plan - 11/27/18 1134    Clinical Impression Statement  Patient has made slow progress toward goals, but feels she has improved overall. Her greatest weakness is in ABD and flex. Her pain level with ADLS has decreased but is still 5/10. Ice helps with her pain. She is able to reach behind her back with pain. She requires assist from RUE with most ADLS. Patient  wasn't feeling well today so TE was stopped early.    Rehab Potential  Good    PT Frequency  2x / week    PT Duration  8 weeks    PT Treatment/Interventions  ADLs/Self Care Home Management;Cryotherapy;Electrical Stimulation;Iontophoresis 45m/ml Dexamethasone;Therapeutic exercise;Patient/family education;Manual techniques;Moist Heat;Ultrasound;Neuromuscular re-education;Passive range of motion;Dry needling;Taping    PT Next Visit Plan  continue strengthening/ROM    Consulted and Agree with Plan of Care  Patient       Patient will benefit from skilled therapeutic intervention in order to improve the following deficits and impairments:  Decreased range of motion, Pain, Decreased strength, Postural dysfunction, Impaired UE functional use, Increased muscle spasms  Visit Diagnosis: Chronic left shoulder pain  Muscle weakness (generalized)  Stiffness of left shoulder joint     Problem List Patient Active Problem List   Diagnosis Date Noted  . Essential hypertension, benign 09/03/2014  . Morbid obesity ([place] 03/06/2014  . HTN (hypertension) 03/06/2014  . Vertigo 03/06/2014  . Type II or unspecified type diabetes mellitus without mention of complication, uncontrolled 03/06/2014  . Pure hypercholesterolemia 03/06/2014    JMadelyn FlavorsPT 11/27/2018, 11:41 AM  [place] [address]Phone: [phone]  Fax:  [phone] Name: JSTEPHENY CANALMRN: [id number] of Birth: 805/26/1947

## 2018-11-28 DIAGNOSIS — E1351 Other specified diabetes mellitus with diabetic peripheral angiopathy without gangrene: Secondary | ICD-10-CM | POA: Diagnosis not present

## 2018-11-28 DIAGNOSIS — L602 Onychogryphosis: Secondary | ICD-10-CM | POA: Diagnosis not present

## 2018-11-29 DIAGNOSIS — G609 Hereditary and idiopathic neuropathy, unspecified: Secondary | ICD-10-CM | POA: Diagnosis not present

## 2018-11-29 DIAGNOSIS — E7800 Pure hypercholesterolemia, unspecified: Secondary | ICD-10-CM | POA: Diagnosis not present

## 2018-11-29 DIAGNOSIS — E1165 Type 2 diabetes mellitus with hyperglycemia: Secondary | ICD-10-CM | POA: Diagnosis not present

## 2018-11-29 DIAGNOSIS — Z9641 Presence of insulin pump (external) (internal): Secondary | ICD-10-CM | POA: Diagnosis not present

## 2018-11-29 DIAGNOSIS — E669 Obesity, unspecified: Secondary | ICD-10-CM | POA: Diagnosis not present

## 2018-11-29 DIAGNOSIS — I10 Essential (primary) hypertension: Secondary | ICD-10-CM | POA: Diagnosis not present

## 2018-11-30 ENCOUNTER — Ambulatory Visit: Payer: Medicare Other | Admitting: Physical Therapy

## 2018-12-05 ENCOUNTER — Ambulatory Visit: Payer: Medicare Other | Admitting: Physical Therapy

## 2018-12-07 ENCOUNTER — Ambulatory Visit: Payer: Medicare Other | Admitting: Physical Therapy

## 2018-12-11 ENCOUNTER — Encounter: Payer: Self-pay | Admitting: Physical Therapy

## 2018-12-11 ENCOUNTER — Ambulatory Visit: Payer: Medicare Other | Admitting: Physical Therapy

## 2018-12-11 DIAGNOSIS — M6281 Muscle weakness (generalized): Secondary | ICD-10-CM

## 2018-12-11 DIAGNOSIS — M25511 Pain in right shoulder: Secondary | ICD-10-CM | POA: Diagnosis not present

## 2018-12-11 DIAGNOSIS — M25612 Stiffness of left shoulder, not elsewhere classified: Secondary | ICD-10-CM

## 2018-12-11 DIAGNOSIS — G8929 Other chronic pain: Secondary | ICD-10-CM | POA: Diagnosis not present

## 2018-12-11 DIAGNOSIS — M25512 Pain in left shoulder: Secondary | ICD-10-CM | POA: Diagnosis not present

## 2018-12-11 NOTE — Therapy (Signed)
[place], [place], [address] Phone: [phone]   Fax:  [phone]  Physical Therapy Treatment  Patient Details  Name: SHARVI MOONEYHAN MRN: [id number] Date of Birth: [birth year] Referring Provider (PT): Frederik Pear MD   Encounter Date: 12/11/2018  PT End of Session - 12/11/18 1055    Visit Number  11    Date for PT Re-Evaluation  12/25/18    PT Start Time  6962    PT Stop Time  1110    PT Time Calculation (min)  55 min    Activity Tolerance  Patient tolerated treatment well    Behavior During Therapy  [facility] for tasks assessed/performed       Past Medical History:  Diagnosis Date  . Anemia   . Anxiety   . Arthritis   . Cataract   . Depression   . Diabetes mellitus without complication ([place])   . Diastolic dysfunction   . Frozen shoulder   . Glaucoma   . Headache    History of migraines  . Heart murmur   . History of kidney stones   . Hyperlipidemia   . Hypertension   . Macular degeneration   . Neuropathy   . Obesity   . Sleep apnea   . Stroke [facility])    has been told by a Dr. Mort Sawyers that she may have had a mini stroke but no further testing was done to confirm  . Tachycardia     Past Surgical History:  Procedure Laterality Date  . CARDIAC CATHETERIZATION     -6/09-normal ejection fraction 65%, no angiographically significant CAD., M. Skains MD  . CESAREAN SECTION    . COLONOSCOPY    . foot spurs removed    . HYSTEROSCOPY W/D&C N/A 06/28/2017   Procedure: DILATATION AND CURETTAGE /HYSTEROSCOPY/POLYPECTOMY WITH MYOSURE;  Surgeon: Christophe Louis, MD;  Location: [facility];  Service: Gynecology;  Laterality: N/A;  . JOINT REPLACEMENT     right knee replaced  . knee spurs removed    . shoulder spurs removed    . TONSILLECTOMY AND ADENOIDECTOMY    . UPPER GI ENDOSCOPY      There were no vitals filed for this visit.  Subjective Assessment - 12/11/18 1017    Subjective   "Everything is going fine" "The shoulder is fine"    Currently in Pain?  Yes    Pain Score  3     Pain Location  Shoulder    Pain Orientation  Left         [facility] PT Assessment - 12/11/18 0001      AROM   Left Shoulder Flexion  100 Degrees    Left Shoulder ABduction  75 Degrees                   [facility] Adult PT Treatment/Exercise - 12/11/18 0001      Shoulder Exercises: Seated   Extension  Strengthening;Both;Theraband;15 reps   x2   Theraband Level (Shoulder Extension)  Level 4 (Blue)    Row  Strengthening;Both;Theraband;15 reps   x2   Theraband Level (Shoulder Row)  Level 4 (Blue)    External Rotation  Strengthening;Both;Weights;15 reps   x2   Theraband Level (Shoulder External Rotation)  Level 2 (Red)    Internal Rotation  Strengthening;Both;20 reps;Theraband    Theraband Level (Shoulder Internal Rotation)  Level 3 (Green)    Flexion  Strengthening;20 reps;Both  Shoulder Exercises: ROM/Strengthening   UBE (Upper Arm Bike)  L4 x 6 min      Modalities   Modalities  Electrical Stimulation;Cryotherapy      Cryotherapy   Number Minutes Cryotherapy  15 Minutes    Cryotherapy Location  Shoulder    Type of Cryotherapy  Ice pack      Electrical Stimulation   Electrical Stimulation Location  left shoulder    Electrical Stimulation Action  IFC    Electrical Stimulation Parameters  sitting    Electrical Stimulation Goals  Pain               PT Short Term Goals - 11/20/18 1021      PT SHORT TERM GOAL #1   Title  independent with intial HEP    Time  2    Period  Weeks    Status  Achieved        PT Long Term Goals - 12/11/18 1058      PT LONG TERM GOAL #1   Title  Improved Lt shoulder flexion to 155 degrees or better to improve function.    Status  On-going      PT LONG TERM GOAL #2   Title  able to peform ADLs with 2/10 pain or less in left shoulder.    Status  On-going            Plan - 12/11/18 1055    Clinical Impression  Statement  Pt reports that she has been sick for 4 weeks. A small progression mad in L shoulder flexion. She reports less shoulder pain overall. Increase resistance tolerated with seated rows and extensions. Good strength to Internal rotation. Progressed resistance with external rotation but with little ROM with LUE.    Rehab Potential  Good    PT Frequency  2x / week    PT Duration  8 weeks    PT Treatment/Interventions  ADLs/Self Care Home Management;Cryotherapy;Electrical Stimulation;Iontophoresis 4mg /ml Dexamethasone;Therapeutic exercise;Patient/family education;Manual techniques;Moist Heat;Ultrasound;Neuromuscular re-education;Passive range of motion;Dry needling;Taping    PT Next Visit Plan  continue strengthening/ROM       Patient will benefit from skilled therapeutic intervention in order to improve the following deficits and impairments:  Decreased range of motion, Pain, Decreased strength, Postural dysfunction, Impaired UE functional use, Increased muscle spasms  Visit Diagnosis: Chronic left shoulder pain  Muscle weakness (generalized)  Stiffness of left shoulder joint     Problem List Patient Active Problem List   Diagnosis Date Noted  . Essential hypertension, benign 09/03/2014  . Morbid obesity ([place]) 03/06/2014  . HTN (hypertension) 03/06/2014  . Vertigo 03/06/2014  . Type II or unspecified type diabetes mellitus without mention of complication, uncontrolled 03/06/2014  . Pure hypercholesterolemia 03/06/2014    Scot Jun, PTA 12/11/2018, 10:58 AM  [place], [place], [address] Phone: [phone]   Fax:  [phone]  Name: YAELI HARTUNG MRN: [id number] Date of Birth: 1946-01-05

## 2018-12-13 ENCOUNTER — Ambulatory Visit: Payer: Medicare Other | Admitting: Physical Therapy

## 2018-12-13 DIAGNOSIS — G8929 Other chronic pain: Secondary | ICD-10-CM

## 2018-12-13 DIAGNOSIS — M6281 Muscle weakness (generalized): Secondary | ICD-10-CM | POA: Diagnosis not present

## 2018-12-13 DIAGNOSIS — M25512 Pain in left shoulder: Secondary | ICD-10-CM | POA: Diagnosis not present

## 2018-12-13 DIAGNOSIS — M25612 Stiffness of left shoulder, not elsewhere classified: Secondary | ICD-10-CM | POA: Diagnosis not present

## 2018-12-13 DIAGNOSIS — M25511 Pain in right shoulder: Secondary | ICD-10-CM | POA: Diagnosis not present

## 2018-12-13 NOTE — Therapy (Signed)
[place], [place], [address] Phone: [phone]   Fax:  [phone]  Physical Therapy Treatment  Patient Details  Name: Jessica [name] MRN: [id number] Date of Birth: [birth year] Referring Provider (PT): Frederik Pear MD   Encounter Date: 12/13/2018  PT End of Session - 12/13/18 1037    Visit Number  12    Date for PT Re-Evaluation  12/25/18    PT Start Time  1005    PT Stop Time  1100    PT Time Calculation (min)  55 min       Past Medical History:  Diagnosis Date  . Anemia   . Anxiety   . Arthritis   . Cataract   . Depression   . Diabetes mellitus without complication ([place])   . Diastolic dysfunction   . Frozen shoulder   . Glaucoma   . Headache    History of migraines  . Heart murmur   . History of kidney stones   . Hyperlipidemia   . Hypertension   . Macular degeneration   . Neuropathy   . Obesity   . Sleep apnea   . Stroke [facility])    has been told by a Dr. Mort Sawyers that she may have had a mini stroke but no further testing was done to confirm  . Tachycardia     Past Surgical History:  Procedure Laterality Date  . CARDIAC CATHETERIZATION     -6/09-normal ejection fraction 65%, no angiographically significant CAD., M. Skains MD  . CESAREAN SECTION    . COLONOSCOPY    . foot spurs removed    . HYSTEROSCOPY W/D&C N/A 06/28/2017   Procedure: DILATATION AND CURETTAGE /HYSTEROSCOPY/POLYPECTOMY WITH MYOSURE;  Surgeon: Christophe Louis, MD;  Location: [facility];  Service: Gynecology;  Laterality: N/A;  . JOINT REPLACEMENT     right knee replaced  . knee spurs removed    . shoulder spurs removed    . TONSILLECTOMY AND ADENOIDECTOMY    . UPPER GI ENDOSCOPY      There were no vitals filed for this visit.  Subjective Assessment - 12/13/18 1007    Subjective  soreness pain  and when I raise up it just stops    Currently in Pain?  Yes    Pain Score  3     Pain Location  Shoulder    Pain Orientation  Left                       [facility] Adult PT Treatment/Exercise - 12/13/18 0001      Shoulder Exercises: Seated   Extension  Strengthening;Both;Theraband;15 reps   2 sets   Theraband Level (Shoulder Extension)  Level 4 (Blue)    Row  Strengthening;Both;Theraband;15 reps   2 sets   Theraband Level (Shoulder Row)  Level 4 (Blue)    External Rotation  Strengthening;Both;Weights;15 reps   2 sets    Theraband Level (Shoulder External Rotation)  Level 2 (Red)    Internal Rotation  Strengthening;Both;20 reps;Theraband    Theraband Level (Shoulder Internal Rotation)  Level 3 (Green)    Flexion  Strengthening;20 reps;Both;Weights    Flexion Weight (lbs)  2    Abduction  Strengthening;Both;20 reps;Weights    ABduction Weight (lbs)  2    Other Seated Exercises  with cane chest press and shld flex 15 times 3#   bicep curl red tband 2 sets 15   Other Seated  Exercises  2# chest press 20 reps BIL      Shoulder Exercises: ROM/Strengthening   UBE (Upper Arm Bike)  L 4 3 fwd/3back      Modalities   Modalities  Electrical Stimulation;Cryotherapy      Cryotherapy   Number Minutes Cryotherapy  15 Minutes    Cryotherapy Location  Shoulder    Type of Cryotherapy  Ice pack      Electrical Stimulation   Electrical Stimulation Location  left shoulder    Electrical Stimulation Action  IFC    Electrical Stimulation Parameters  sitting    Electrical Stimulation Goals  Pain             PT Education - 12/13/18 1036    Education provided  Yes    Education Details  educ on TENS use, application and set up    Person(s) Educated  Patient    Methods  Explanation;Demonstration    Comprehension  Verbalized understanding;Returned demonstration       PT Short Term Goals - 11/20/18 1021      PT SHORT TERM GOAL #1   Title  independent with intial HEP    Time  2    Period  Weeks    Status  Achieved        PT Long Term Goals - 12/11/18 1058      PT LONG TERM  GOAL #1   Title  Improved Lt shoulder flexion to 155 degrees or better to improve function.    Status  On-going      PT LONG TERM GOAL #2   Title  able to peform ADLs with 2/10 pain or less in left shoulder.    Status  On-going            Plan - 12/13/18 1037    Clinical Impression Statement  educated on TENS use, application, set up and safety today. Pt tolerated more ther ex with increased ROM    PT Treatment/Interventions  ADLs/Self Care Home Management;Cryotherapy;Electrical Stimulation;Iontophoresis 4mg /ml Dexamethasone;Therapeutic exercise;Patient/family education;Manual techniques;Moist Heat;Ultrasound;Neuromuscular re-education;Passive range of motion;Dry needling;Taping    PT Next Visit Plan  continue strengthening/ROM       Patient will benefit from skilled therapeutic intervention in order to improve the following deficits and impairments:  Decreased range of motion, Pain, Decreased strength, Postural dysfunction, Impaired UE functional use, Increased muscle spasms  Visit Diagnosis: Chronic left shoulder pain  Muscle weakness (generalized)  Stiffness of left shoulder joint     Problem List Patient Active Problem List   Diagnosis Date Noted  . Essential hypertension, benign 09/03/2014  . Morbid obesity ([place]) 03/06/2014  . HTN (hypertension) 03/06/2014  . Vertigo 03/06/2014  . Type II or unspecified type diabetes mellitus without mention of complication, uncontrolled 03/06/2014  . Pure hypercholesterolemia 03/06/2014    ,ANGIE PTA 12/13/2018, 10:39 AM  [place], [place], [address] Phone: [phone]   Fax:  [phone]  Name: Jessica [name] MRN: [id number] Date of Birth: April 18, 1946

## 2018-12-18 ENCOUNTER — Ambulatory Visit: Payer: Medicare Other | Admitting: Physical Therapy

## 2018-12-22 ENCOUNTER — Ambulatory Visit: Payer: Medicare Other | Admitting: Physical Therapy

## 2018-12-22 ENCOUNTER — Encounter: Payer: Self-pay | Admitting: Physical Therapy

## 2018-12-22 DIAGNOSIS — M25512 Pain in left shoulder: Principal | ICD-10-CM

## 2018-12-22 DIAGNOSIS — M6281 Muscle weakness (generalized): Secondary | ICD-10-CM

## 2018-12-22 DIAGNOSIS — M25511 Pain in right shoulder: Secondary | ICD-10-CM | POA: Diagnosis not present

## 2018-12-22 DIAGNOSIS — G8929 Other chronic pain: Secondary | ICD-10-CM | POA: Diagnosis not present

## 2018-12-22 DIAGNOSIS — M25612 Stiffness of left shoulder, not elsewhere classified: Secondary | ICD-10-CM

## 2018-12-22 NOTE — Therapy (Signed)
[place], [place], [address] Phone: [phone]   Fax:  [phone]  Physical Therapy Treatment  Patient Details  Name: KEYONNI PERCIVAL MRN: [id number] Date of Birth: Aug 13, 1946 Referring Provider (PT): Frederik Pear MD   Encounter Date: 12/22/2018  PT End of Session - 12/22/18 1052    Visit Number  13    Date for PT Re-Evaluation  12/25/18    Authorization Type  KX    PT Start Time  1015    PT Stop Time  1105    PT Time Calculation (min)  50 min    Activity Tolerance  Patient tolerated treatment well    Behavior During Therapy  [facility] for tasks assessed/performed       Past Medical History:  Diagnosis Date  . Anemia   . Anxiety   . Arthritis   . Cataract   . Depression   . Diabetes mellitus without complication ([place])   . Diastolic dysfunction   . Frozen shoulder   . Glaucoma   . Headache    History of migraines  . Heart murmur   . History of kidney stones   . Hyperlipidemia   . Hypertension   . Macular degeneration   . Neuropathy   . Obesity   . Sleep apnea   . Stroke [facility])    has been told by a Dr. Mort Sawyers that she may have had a mini stroke but no further testing was done to confirm  . Tachycardia     Past Surgical History:  Procedure Laterality Date  . CARDIAC CATHETERIZATION     -6/09-normal ejection fraction 65%, no angiographically significant CAD., M. Skains MD  . CESAREAN SECTION    . COLONOSCOPY    . foot spurs removed    . HYSTEROSCOPY W/D&C N/A 06/28/2017   Procedure: DILATATION AND CURETTAGE /HYSTEROSCOPY/POLYPECTOMY WITH MYOSURE;  Surgeon: Christophe Louis, MD;  Location: [facility];  Service: Gynecology;  Laterality: N/A;  . JOINT REPLACEMENT     right knee replaced  . knee spurs removed    . shoulder spurs removed    . TONSILLECTOMY AND ADENOIDECTOMY    . UPPER GI ENDOSCOPY      There were no vitals filed for this visit.  Subjective Assessment - 12/22/18  1022    Subjective  "I am fine"    Currently in Pain?  No/denies                       [facility] Adult PT Treatment/Exercise - 12/22/18 0001      Shoulder Exercises: Seated   Extension  Strengthening;Both;Theraband;15 reps   x2   Theraband Level (Shoulder Extension)  Level 3 (Green)    Row  Strengthening;Both;Theraband;15 reps   x2   Theraband Level (Shoulder Row)  Level 4 (Blue)    External Rotation  Strengthening;Both;Weights;15 reps   x2   Theraband Level (Shoulder External Rotation)  Level 2 (Red)    Internal Rotation  Strengthening;Both;Theraband;15 reps   x2   Theraband Level (Shoulder Internal Rotation)  Level 2 (Red)    Flexion  Strengthening;20 reps;Both;Weights    Flexion Weight (lbs)  1    Abduction  Strengthening;Both;20 reps;Weights    ABduction Weight (lbs)  1    Other Seated Exercises  triceps ext red tband 2x15       Shoulder Exercises: ROM/Strengthening   UBE (Upper Arm Bike)  L 4 3 fwd/3back  Modalities   Modalities  Electrical Stimulation;Cryotherapy      Moist Heat Therapy   Number Minutes Moist Heat  15 Minutes    Moist Heat Location  Shoulder      Electrical Stimulation   Electrical Stimulation Location  left shoulder    Electrical Stimulation Action  IFC    Electrical Stimulation Parameters  sitting    Electrical Stimulation Goals  Pain               PT Short Term Goals - 11/20/18 1021      PT SHORT TERM GOAL #1   Title  independent with intial HEP    Time  2    Period  Weeks    Status  Achieved        PT Long Term Goals - 12/11/18 1058      PT LONG TERM GOAL #1   Title  Improved Lt shoulder flexion to 155 degrees or better to improve function.    Status  On-going      PT LONG TERM GOAL #2   Title  able to peform ADLs with 2/10 pain or less in left shoulder.    Status  On-going            Plan - 12/22/18 1053    Clinical Impression Statement  Pt continues to improve with exercise tolerance. Tactile  cues to keep her elbow to her side with external and internal rotation. Pt reports more function at home.    Rehab Potential  Good    PT Frequency  2x / week    PT Duration  8 weeks    PT Treatment/Interventions  ADLs/Self Care Home Management;Cryotherapy;Electrical Stimulation;Iontophoresis 4mg /ml Dexamethasone;Therapeutic exercise;Patient/family education;Manual techniques;Moist Heat;Ultrasound;Neuromuscular re-education;Passive range of motion;Dry needling;Taping    PT Next Visit Plan  continue strengthening/ROM       Patient will benefit from skilled therapeutic intervention in order to improve the following deficits and impairments:  Decreased range of motion, Pain, Decreased strength, Postural dysfunction, Impaired UE functional use, Increased muscle spasms  Visit Diagnosis: Chronic left shoulder pain  Stiffness of left shoulder joint  Acute pain of right shoulder  Muscle weakness (generalized)     Problem List Patient Active Problem List   Diagnosis Date Noted  . Essential hypertension, benign 09/03/2014  . Morbid obesity ([place]) 03/06/2014  . HTN (hypertension) 03/06/2014  . Vertigo 03/06/2014  . Type II or unspecified type diabetes mellitus without mention of complication, uncontrolled 03/06/2014  . Pure hypercholesterolemia 03/06/2014    Scot Jun, PTA 12/22/2018, 10:56 AM  [place], [place], [address] Phone: [phone]   Fax:  [phone]  Name: TASMINE HIPWELL MRN: [id number] Date of Birth: Dec 15, 1945

## 2018-12-25 ENCOUNTER — Ambulatory Visit: Payer: Medicare Other | Attending: Family Medicine | Admitting: Physical Therapy

## 2018-12-25 ENCOUNTER — Encounter: Payer: Self-pay | Admitting: Physical Therapy

## 2018-12-25 DIAGNOSIS — M25511 Pain in right shoulder: Secondary | ICD-10-CM | POA: Diagnosis not present

## 2018-12-25 DIAGNOSIS — M25512 Pain in left shoulder: Secondary | ICD-10-CM | POA: Insufficient documentation

## 2018-12-25 DIAGNOSIS — M6281 Muscle weakness (generalized): Secondary | ICD-10-CM | POA: Diagnosis not present

## 2018-12-25 DIAGNOSIS — M25612 Stiffness of left shoulder, not elsewhere classified: Secondary | ICD-10-CM | POA: Diagnosis not present

## 2018-12-25 DIAGNOSIS — G8929 Other chronic pain: Secondary | ICD-10-CM | POA: Diagnosis not present

## 2018-12-25 NOTE — Therapy (Signed)
[place], [place], [address] Phone: [phone]   Fax:  [phone]  Physical Therapy Treatment  Patient Details  Name: Jessica [name] MRN: [id number] Date of Birth: [birth year] Referring Provider (PT): Frederik Pear MD   Encounter Date: 12/25/2018  PT End of Session - 12/25/18 1055    Visit Number  14    Date for PT Re-Evaluation  12/25/18    PT Start Time  7425    PT Stop Time  1109    PT Time Calculation (min)  54 min    Activity Tolerance  Patient tolerated treatment well    Behavior During Therapy  [facility] for tasks assessed/performed       Past Medical History:  Diagnosis Date  . Anemia   . Anxiety   . Arthritis   . Cataract   . Depression   . Diabetes mellitus without complication ([place])   . Diastolic dysfunction   . Frozen shoulder   . Glaucoma   . Headache    History of migraines  . Heart murmur   . History of kidney stones   . Hyperlipidemia   . Hypertension   . Macular degeneration   . Neuropathy   . Obesity   . Sleep apnea   . Stroke [facility])    has been told by a Dr. Mort Sawyers that she may have had a mini stroke but no further testing was done to confirm  . Tachycardia     Past Surgical History:  Procedure Laterality Date  . CARDIAC CATHETERIZATION     -6/09-normal ejection fraction 65%, no angiographically significant CAD., M. Skains MD  . CESAREAN SECTION    . COLONOSCOPY    . foot spurs removed    . HYSTEROSCOPY W/D&C N/A 06/28/2017   Procedure: DILATATION AND CURETTAGE /HYSTEROSCOPY/POLYPECTOMY WITH MYOSURE;  Surgeon: Christophe Louis, MD;  Location: [facility];  Service: Gynecology;  Laterality: N/A;  . JOINT REPLACEMENT     right knee replaced  . knee spurs removed    . shoulder spurs removed    . TONSILLECTOMY AND ADENOIDECTOMY    . UPPER GI ENDOSCOPY      There were no vitals filed for this visit.  Subjective Assessment - 12/25/18 1015    Subjective  "Pretty  good, Pretty good, I used the TENS this morning"    Currently in Pain?  No/denies         [facility] PT Assessment - 12/25/18 0001      AROM   Left Shoulder Flexion  105 Degrees    Left Shoulder ABduction  75 Degrees                   [facility] Adult PT Treatment/Exercise - 12/25/18 0001      Shoulder Exercises: Seated   Row  Strengthening;Both;Theraband;15 reps    Theraband Level (Shoulder Row)  Other (comment)   Level 5 (Black)   External Rotation  Strengthening;Both;Weights;20 reps    Theraband Level (Shoulder External Rotation)  Level 2 (Red)    Internal Rotation  Strengthening;Both;Theraband;20 reps    Theraband Level (Shoulder Internal Rotation)  Level 4 (Blue)      Shoulder Exercises: Standing   Flexion  20 reps;Both;Weights;Strengthening    Shoulder Flexion Weight (lbs)  2    ABduction  20 reps;Strengthening;Both    Shoulder ABduction Weight (lbs)  1    Extension  Theraband;20 reps;Strengthening;Both    Theraband Level (Shoulder Extension)  Level 2 (Red)    Other Standing Exercises  Bicep curls 4lb 2x10       Shoulder Exercises: ROM/Strengthening   UBE (Upper Arm Bike)  L 4 4 fwd/4back      Modalities   Modalities  Electrical Stimulation;Cryotherapy      Moist Heat Therapy   Number Minutes Moist Heat  15 Minutes    Moist Heat Location  Shoulder      Electrical Stimulation   Electrical Stimulation Location  left shoulder    Electrical Stimulation Action  IFC    Electrical Stimulation Parameters  sititng    Electrical Stimulation Goals  Pain               PT Short Term Goals - 11/20/18 1021      PT SHORT TERM GOAL #1   Title  independent with intial HEP    Time  2    Period  Weeks    Status  Achieved        PT Long Term Goals - 12/11/18 1058      PT LONG TERM GOAL #1   Title  Improved Lt shoulder flexion to 155 degrees or better to improve function.    Status  On-going      PT LONG TERM GOAL #2   Title  able to peform ADLs with  2/10 pain or less in left shoulder.    Status  On-going            Plan - 12/25/18 1100    Clinical Impression Statement  Pt did well tolerating today's exercises. She did fatigue a little quicker doing them in the standing position. Some difficulty With L shoulder External rotation. Minor improvement in L shoulder ROM but defects remain. Increase resistance and reps tolerated today. Informed pt not to use TENs unit for an hour at a time. Expressed 30 minutes on and 30 minutes off     Rehab Potential  Good    PT Frequency  2x / week    PT Treatment/Interventions  ADLs/Self Care Home Management;Cryotherapy;Electrical Stimulation;Iontophoresis 51m/ml Dexamethasone;Therapeutic exercise;Patient/family education;Manual techniques;Moist Heat;Ultrasound;Neuromuscular re-education;Passive range of motion;Dry needling;Taping    PT Next Visit Plan  D/C PT       Patient will benefit from skilled therapeutic intervention in order to improve the following deficits and impairments:  Decreased range of motion, Pain, Decreased strength, Postural dysfunction, Impaired UE functional use, Increased muscle spasms  Visit Diagnosis: Chronic left shoulder pain  Stiffness of left shoulder joint  Acute pain of right shoulder  Muscle weakness (generalized)     Problem List Patient Active Problem List   Diagnosis Date Noted  . Essential hypertension, benign 09/03/2014  . [place]bid [place] ([place] 03/06/2014  . HTN (hypertension) 03/06/2014  . Vertigo 03/06/2014  . Type II or unspecified type diabetes mellitus without mention of complication, uncontrolled 03/06/2014  . Pure hypercholesterolemia 03/06/2014    PHYSICAL THERAPY DISCHARGE SUMMARY  Visits from Start of Care: 14 Plan: Patient agrees to discharge.  Patient goals were partially met. Patient is being discharged due to lack of progress.  ?????    RScot Jun PTA 12/25/2018, 11:05 AM  [place] [address]: [phone]  Fax:  [phone] Name: Jessica WANNINGERMRN: [id number] of Birth: [birth year]

## 2018-12-28 ENCOUNTER — Ambulatory Visit: Payer: Medicare Other | Admitting: Physical Therapy

## 2019-01-02 DIAGNOSIS — H401211 Low-tension glaucoma, right eye, mild stage: Secondary | ICD-10-CM | POA: Diagnosis not present

## 2019-01-02 DIAGNOSIS — H40033 Anatomical narrow angle, bilateral: Secondary | ICD-10-CM | POA: Diagnosis not present

## 2019-01-02 DIAGNOSIS — H401222 Low-tension glaucoma, left eye, moderate stage: Secondary | ICD-10-CM | POA: Diagnosis not present

## 2019-02-28 DIAGNOSIS — E7800 Pure hypercholesterolemia, unspecified: Secondary | ICD-10-CM | POA: Diagnosis not present

## 2019-02-28 DIAGNOSIS — I10 Essential (primary) hypertension: Secondary | ICD-10-CM | POA: Diagnosis not present

## 2019-02-28 DIAGNOSIS — G609 Hereditary and idiopathic neuropathy, unspecified: Secondary | ICD-10-CM | POA: Diagnosis not present

## 2019-02-28 DIAGNOSIS — E1165 Type 2 diabetes mellitus with hyperglycemia: Secondary | ICD-10-CM | POA: Diagnosis not present

## 2019-02-28 DIAGNOSIS — Z9641 Presence of insulin pump (external) (internal): Secondary | ICD-10-CM | POA: Diagnosis not present

## 2019-02-28 DIAGNOSIS — E669 Obesity, unspecified: Secondary | ICD-10-CM | POA: Diagnosis not present

## 2019-03-06 DIAGNOSIS — E1351 Other specified diabetes mellitus with diabetic peripheral angiopathy without gangrene: Secondary | ICD-10-CM | POA: Diagnosis not present

## 2019-03-06 DIAGNOSIS — L602 Onychogryphosis: Secondary | ICD-10-CM | POA: Diagnosis not present

## 2019-04-17 ENCOUNTER — Inpatient Hospital Stay (HOSPITAL_COMMUNITY)
Admission: EM | Admit: 2019-04-17 | Discharge: 2019-04-30 | DRG: 481 | Payer: Medicare Other | Attending: Internal Medicine | Admitting: Internal Medicine

## 2019-04-17 ENCOUNTER — Other Ambulatory Visit: Payer: Self-pay

## 2019-04-17 ENCOUNTER — Inpatient Hospital Stay (HOSPITAL_COMMUNITY): Payer: Medicare Other

## 2019-04-17 ENCOUNTER — Emergency Department (HOSPITAL_COMMUNITY): Payer: Medicare Other

## 2019-04-17 ENCOUNTER — Encounter (HOSPITAL_COMMUNITY): Payer: Self-pay | Admitting: Internal Medicine

## 2019-04-17 DIAGNOSIS — Z794 Long term (current) use of insulin: Secondary | ICD-10-CM | POA: Diagnosis not present

## 2019-04-17 DIAGNOSIS — I5032 Chronic diastolic (congestive) heart failure: Secondary | ICD-10-CM | POA: Diagnosis present

## 2019-04-17 DIAGNOSIS — Z79899 Other long term (current) drug therapy: Secondary | ICD-10-CM

## 2019-04-17 DIAGNOSIS — S3993XA Unspecified injury of pelvis, initial encounter: Secondary | ICD-10-CM | POA: Diagnosis not present

## 2019-04-17 DIAGNOSIS — S79911A Unspecified injury of right hip, initial encounter: Secondary | ICD-10-CM | POA: Diagnosis not present

## 2019-04-17 DIAGNOSIS — S82842A Displaced bimalleolar fracture of left lower leg, initial encounter for closed fracture: Secondary | ICD-10-CM | POA: Diagnosis not present

## 2019-04-17 DIAGNOSIS — Z03818 Encounter for observation for suspected exposure to other biological agents ruled out: Secondary | ICD-10-CM | POA: Diagnosis not present

## 2019-04-17 DIAGNOSIS — Z9641 Presence of insulin pump (external) (internal): Secondary | ICD-10-CM | POA: Diagnosis present

## 2019-04-17 DIAGNOSIS — F419 Anxiety disorder, unspecified: Secondary | ICD-10-CM | POA: Diagnosis not present

## 2019-04-17 DIAGNOSIS — Z791 Long term (current) use of non-steroidal anti-inflammatories (NSAID): Secondary | ICD-10-CM | POA: Diagnosis not present

## 2019-04-17 DIAGNOSIS — S72451A Displaced supracondylar fracture without intracondylar extension of lower end of right femur, initial encounter for closed fracture: Secondary | ICD-10-CM | POA: Diagnosis present

## 2019-04-17 DIAGNOSIS — D649 Anemia, unspecified: Secondary | ICD-10-CM | POA: Diagnosis not present

## 2019-04-17 DIAGNOSIS — G4733 Obstructive sleep apnea (adult) (pediatric): Secondary | ICD-10-CM | POA: Diagnosis present

## 2019-04-17 DIAGNOSIS — M9711XA Periprosthetic fracture around internal prosthetic right knee joint, initial encounter: Secondary | ICD-10-CM | POA: Diagnosis not present

## 2019-04-17 DIAGNOSIS — Z1159 Encounter for screening for other viral diseases: Secondary | ICD-10-CM | POA: Diagnosis not present

## 2019-04-17 DIAGNOSIS — S72401A Unspecified fracture of lower end of right femur, initial encounter for closed fracture: Secondary | ICD-10-CM | POA: Diagnosis not present

## 2019-04-17 DIAGNOSIS — Z8673 Personal history of transient ischemic attack (TIA), and cerebral infarction without residual deficits: Secondary | ICD-10-CM | POA: Diagnosis not present

## 2019-04-17 DIAGNOSIS — M2550 Pain in unspecified joint: Secondary | ICD-10-CM | POA: Diagnosis not present

## 2019-04-17 DIAGNOSIS — G4730 Sleep apnea, unspecified: Secondary | ICD-10-CM | POA: Diagnosis present

## 2019-04-17 DIAGNOSIS — E7800 Pure hypercholesterolemia, unspecified: Secondary | ICD-10-CM | POA: Diagnosis not present

## 2019-04-17 DIAGNOSIS — F418 Other specified anxiety disorders: Secondary | ICD-10-CM | POA: Diagnosis not present

## 2019-04-17 DIAGNOSIS — Z515 Encounter for palliative care: Secondary | ICD-10-CM | POA: Diagnosis not present

## 2019-04-17 DIAGNOSIS — H409 Unspecified glaucoma: Secondary | ICD-10-CM | POA: Diagnosis present

## 2019-04-17 DIAGNOSIS — Z7401 Bed confinement status: Secondary | ICD-10-CM | POA: Diagnosis not present

## 2019-04-17 DIAGNOSIS — S72451D Displaced supracondylar fracture without intracondylar extension of lower end of right femur, subsequent encounter for closed fracture with routine healing: Secondary | ICD-10-CM | POA: Diagnosis not present

## 2019-04-17 DIAGNOSIS — S8265XA Nondisplaced fracture of lateral malleolus of left fibula, initial encounter for closed fracture: Secondary | ICD-10-CM | POA: Diagnosis not present

## 2019-04-17 DIAGNOSIS — I10 Essential (primary) hypertension: Secondary | ICD-10-CM | POA: Diagnosis present

## 2019-04-17 DIAGNOSIS — I110 Hypertensive heart disease with heart failure: Secondary | ICD-10-CM | POA: Diagnosis present

## 2019-04-17 DIAGNOSIS — S299XXA Unspecified injury of thorax, initial encounter: Secondary | ICD-10-CM | POA: Diagnosis not present

## 2019-04-17 DIAGNOSIS — G8918 Other acute postprocedural pain: Secondary | ICD-10-CM | POA: Diagnosis not present

## 2019-04-17 DIAGNOSIS — S8262XA Displaced fracture of lateral malleolus of left fibula, initial encounter for closed fracture: Secondary | ICD-10-CM | POA: Diagnosis not present

## 2019-04-17 DIAGNOSIS — I878 Other specified disorders of veins: Secondary | ICD-10-CM | POA: Diagnosis present

## 2019-04-17 DIAGNOSIS — L89152 Pressure ulcer of sacral region, stage 2: Secondary | ICD-10-CM | POA: Diagnosis present

## 2019-04-17 DIAGNOSIS — W19XXXA Unspecified fall, initial encounter: Secondary | ICD-10-CM | POA: Diagnosis not present

## 2019-04-17 DIAGNOSIS — S72341D Displaced spiral fracture of shaft of right femur, subsequent encounter for closed fracture with routine healing: Secondary | ICD-10-CM | POA: Diagnosis not present

## 2019-04-17 DIAGNOSIS — Z6841 Body Mass Index (BMI) 40.0 and over, adult: Secondary | ICD-10-CM | POA: Diagnosis not present

## 2019-04-17 DIAGNOSIS — Y92002 Bathroom of unspecified non-institutional (private) residence single-family (private) house as the place of occurrence of the external cause: Secondary | ICD-10-CM

## 2019-04-17 DIAGNOSIS — E785 Hyperlipidemia, unspecified: Secondary | ICD-10-CM | POA: Diagnosis present

## 2019-04-17 DIAGNOSIS — Z7951 Long term (current) use of inhaled steroids: Secondary | ICD-10-CM | POA: Diagnosis not present

## 2019-04-17 DIAGNOSIS — K5900 Constipation, unspecified: Secondary | ICD-10-CM | POA: Diagnosis present

## 2019-04-17 DIAGNOSIS — S7291XA Unspecified fracture of right femur, initial encounter for closed fracture: Secondary | ICD-10-CM | POA: Diagnosis not present

## 2019-04-17 DIAGNOSIS — S72491D Other fracture of lower end of right femur, subsequent encounter for closed fracture with routine healing: Secondary | ICD-10-CM | POA: Diagnosis not present

## 2019-04-17 DIAGNOSIS — S72331A Displaced oblique fracture of shaft of right femur, initial encounter for closed fracture: Secondary | ICD-10-CM | POA: Diagnosis not present

## 2019-04-17 DIAGNOSIS — Z792 Long term (current) use of antibiotics: Secondary | ICD-10-CM

## 2019-04-17 DIAGNOSIS — Z79891 Long term (current) use of opiate analgesic: Secondary | ICD-10-CM

## 2019-04-17 DIAGNOSIS — W010XXA Fall on same level from slipping, tripping and stumbling without subsequent striking against object, initial encounter: Secondary | ICD-10-CM | POA: Diagnosis present

## 2019-04-17 DIAGNOSIS — E1140 Type 2 diabetes mellitus with diabetic neuropathy, unspecified: Secondary | ICD-10-CM | POA: Diagnosis not present

## 2019-04-17 DIAGNOSIS — D62 Acute posthemorrhagic anemia: Secondary | ICD-10-CM | POA: Diagnosis not present

## 2019-04-17 DIAGNOSIS — E1142 Type 2 diabetes mellitus with diabetic polyneuropathy: Secondary | ICD-10-CM | POA: Diagnosis present

## 2019-04-17 DIAGNOSIS — S72341A Displaced spiral fracture of shaft of right femur, initial encounter for closed fracture: Secondary | ICD-10-CM | POA: Diagnosis not present

## 2019-04-17 DIAGNOSIS — M6281 Muscle weakness (generalized): Secondary | ICD-10-CM | POA: Diagnosis not present

## 2019-04-17 DIAGNOSIS — N209 Urinary calculus, unspecified: Secondary | ICD-10-CM | POA: Diagnosis not present

## 2019-04-17 DIAGNOSIS — E1169 Type 2 diabetes mellitus with other specified complication: Secondary | ICD-10-CM

## 2019-04-17 DIAGNOSIS — Z9189 Other specified personal risk factors, not elsewhere classified: Secondary | ICD-10-CM | POA: Diagnosis not present

## 2019-04-17 DIAGNOSIS — S82832A Other fracture of upper and lower end of left fibula, initial encounter for closed fracture: Secondary | ICD-10-CM | POA: Diagnosis not present

## 2019-04-17 DIAGNOSIS — M1990 Unspecified osteoarthritis, unspecified site: Secondary | ICD-10-CM | POA: Diagnosis not present

## 2019-04-17 DIAGNOSIS — R41841 Cognitive communication deficit: Secondary | ICD-10-CM | POA: Diagnosis not present

## 2019-04-17 DIAGNOSIS — Z808 Family history of malignant neoplasm of other organs or systems: Secondary | ICD-10-CM

## 2019-04-17 DIAGNOSIS — B379 Candidiasis, unspecified: Secondary | ICD-10-CM | POA: Diagnosis not present

## 2019-04-17 DIAGNOSIS — S82435A Nondisplaced oblique fracture of shaft of left fibula, initial encounter for closed fracture: Secondary | ICD-10-CM | POA: Diagnosis not present

## 2019-04-17 DIAGNOSIS — I5030 Unspecified diastolic (congestive) heart failure: Secondary | ICD-10-CM | POA: Diagnosis not present

## 2019-04-17 DIAGNOSIS — B373 Candidiasis of vulva and vagina: Secondary | ICD-10-CM | POA: Diagnosis present

## 2019-04-17 DIAGNOSIS — Z411 Encounter for cosmetic surgery: Secondary | ICD-10-CM | POA: Diagnosis not present

## 2019-04-17 DIAGNOSIS — S7291XD Unspecified fracture of right femur, subsequent encounter for closed fracture with routine healing: Secondary | ICD-10-CM | POA: Diagnosis not present

## 2019-04-17 DIAGNOSIS — R52 Pain, unspecified: Secondary | ICD-10-CM | POA: Diagnosis not present

## 2019-04-17 DIAGNOSIS — I959 Hypotension, unspecified: Secondary | ICD-10-CM | POA: Diagnosis not present

## 2019-04-17 DIAGNOSIS — E119 Type 2 diabetes mellitus without complications: Secondary | ICD-10-CM

## 2019-04-17 DIAGNOSIS — Z419 Encounter for procedure for purposes other than remedying health state, unspecified: Secondary | ICD-10-CM

## 2019-04-17 DIAGNOSIS — S79929A Unspecified injury of unspecified thigh, initial encounter: Secondary | ICD-10-CM | POA: Diagnosis not present

## 2019-04-17 DIAGNOSIS — Z8371 Family history of colonic polyps: Secondary | ICD-10-CM

## 2019-04-17 LAB — CBC WITH DIFFERENTIAL/PLATELET
Abs Immature Granulocytes: 0.11 10*3/uL — ABNORMAL HIGH (ref 0.00–0.07)
Basophils Absolute: 0.1 10*3/uL (ref 0.0–0.1)
Basophils Relative: 0 %
Eosinophils Absolute: 0.1 10*3/uL (ref 0.0–0.5)
Eosinophils Relative: 0 %
HCT: 41.9 % (ref 36.0–46.0)
Hemoglobin: 13.4 g/dL (ref 12.0–15.0)
Immature Granulocytes: 1 %
Lymphocytes Relative: 10 %
Lymphs Abs: 1.6 10*3/uL (ref 0.7–4.0)
MCH: 27.5 pg (ref 26.0–34.0)
MCHC: 32 g/dL (ref 30.0–36.0)
MCV: 85.9 fL (ref 80.0–100.0)
Monocytes Absolute: 1.1 10*3/uL — ABNORMAL HIGH (ref 0.1–1.0)
Monocytes Relative: 7 %
Neutro Abs: 13.1 10*3/uL — ABNORMAL HIGH (ref 1.7–7.7)
Neutrophils Relative %: 82 %
Platelets: 202 10*3/uL (ref 150–400)
RBC: 4.88 MIL/uL (ref 3.87–5.11)
RDW: 13.7 % (ref 11.5–15.5)
WBC: 15.9 10*3/uL — ABNORMAL HIGH (ref 4.0–10.5)
nRBC: 0 % (ref 0.0–0.2)

## 2019-04-17 LAB — BASIC METABOLIC PANEL
Anion gap: 9 (ref 5–15)
BUN: 21 mg/dL (ref 8–23)
CO2: 28 mmol/L (ref 22–32)
Calcium: 9.4 mg/dL (ref 8.9–10.3)
Chloride: 100 mmol/L (ref 98–111)
Creatinine, Ser: 0.84 mg/dL (ref 0.44–1.00)
GFR calc Af Amer: >60 mL/min (ref >60)
GFR calc non Af Amer: >60 mL/min (ref >60)
Glucose, Bld: 133 mg/dL — ABNORMAL HIGH (ref 70–99)
Potassium: 4.5 mmol/L (ref 3.5–5.1)
Sodium: 137 mmol/L (ref 135–145)

## 2019-04-17 LAB — TYPE AND SCREEN
ABO/RH(D): O NEG
Antibody Screen: NEGATIVE

## 2019-04-17 LAB — PROTIME-INR
INR: 1 (ref 0.8–1.2)
Prothrombin Time: 13 seconds (ref 11.4–15.2)

## 2019-04-17 LAB — SARS CORONAVIRUS 2 BY RT PCR (HOSPITAL ORDER, PERFORMED IN ~~LOC~~ HOSPITAL LAB): SARS Coronavirus 2: NEGATIVE

## 2019-04-17 LAB — ABO/RH: ABO/RH(D): O NEG

## 2019-04-17 MED ORDER — PROCHLORPERAZINE EDISYLATE 10 MG/2ML IJ SOLN: 10.0000 mg | Freq: Four times a day (QID) | INTRAMUSCULAR | Status: DC | PRN

## 2019-04-17 MED ORDER — FENTANYL CITRATE (PF) 100 MCG/2ML IJ SOLN
50.0000 ug | Freq: Once | INTRAMUSCULAR | Status: AC
  Administered 2019-04-18: 50 ug via INTRAVENOUS
  Filled 2019-04-17: qty 2

## 2019-04-17 MED ORDER — METFORMIN HCL ER 500 MG PO TB24
500.0000 mg | ORAL_TABLET | Freq: Every day | ORAL | Status: DC
  Administered 2019-04-17: 23:00:00 500 mg via ORAL
  Filled 2019-04-17: qty 1

## 2019-04-17 MED ORDER — METOPROLOL TARTRATE 50 MG PO TABS
100.0000 mg | ORAL_TABLET | Freq: Once | ORAL | Status: AC
  Administered 2019-04-17: 100 mg via ORAL
  Filled 2019-04-17: qty 2

## 2019-04-17 MED ORDER — SIMVASTATIN 40 MG PO TABS
40.0000 mg | ORAL_TABLET | Freq: Once | ORAL | Status: AC
  Administered 2019-04-17: 40 mg via ORAL
  Filled 2019-04-17: qty 1

## 2019-04-17 MED ORDER — KETOROLAC TROMETHAMINE 15 MG/ML IJ SOLN
15.0000 mg | Freq: Once | INTRAMUSCULAR | Status: AC
  Administered 2019-04-17: 20:00:00 15 mg via INTRAVENOUS
  Filled 2019-04-17: qty 1

## 2019-04-17 MED ORDER — ONDANSETRON HCL 4 MG/2ML IJ SOLN: 4.0000 mg | Freq: Once | INTRAMUSCULAR | Status: DC

## 2019-04-17 MED ORDER — FENTANYL CITRATE (PF) 100 MCG/2ML IJ SOLN: 50.0000 ug | INTRAMUSCULAR | Status: DC | PRN

## 2019-04-17 MED ORDER — LATANOPROST 0.005 % OP SOLN
1.0000 [drp] | Freq: Every day | OPHTHALMIC | Status: DC
  Administered 2019-04-17 – 2019-04-29 (×13): 1 [drp] via OPHTHALMIC
  Filled 2019-04-17: qty 2.5

## 2019-04-17 MED ORDER — ACETAMINOPHEN 650 MG RE SUPP: 650.0000 mg | Freq: Four times a day (QID) | RECTAL | Status: DC | PRN

## 2019-04-17 MED ORDER — CANAGLIFLOZIN 100 MG PO TABS
300.0000 mg | ORAL_TABLET | Freq: Every day | ORAL | Status: DC
  Administered 2019-04-18: 300 mg via ORAL
  Filled 2019-04-17: qty 3

## 2019-04-17 MED ORDER — SIMVASTATIN 40 MG PO TABS
40.0000 mg | ORAL_TABLET | Freq: Every evening | ORAL | Status: DC
  Administered 2019-04-18 – 2019-04-29 (×12): 40 mg via ORAL
  Filled 2019-04-17 (×12): qty 1

## 2019-04-17 MED ORDER — DORZOLAMIDE HCL-TIMOLOL MAL 2-0.5 % OP SOLN
1.0000 [drp] | Freq: Two times a day (BID) | OPHTHALMIC | Status: DC
  Administered 2019-04-17 – 2019-04-30 (×24): 1 [drp] via OPHTHALMIC
  Filled 2019-04-17: qty 10

## 2019-04-17 MED ORDER — LOSARTAN POTASSIUM 50 MG PO TABS
100.0000 mg | ORAL_TABLET | Freq: Every day | ORAL | Status: DC
  Administered 2019-04-18 – 2019-04-30 (×12): 100 mg via ORAL
  Filled 2019-04-17 (×12): qty 2

## 2019-04-17 MED ORDER — ONDANSETRON HCL 4 MG/2ML IJ SOLN
4.0000 mg | Freq: Once | INTRAMUSCULAR | Status: AC
  Administered 2019-04-17: 15:00:00 4 mg via INTRAVENOUS
  Filled 2019-04-17: qty 2

## 2019-04-17 MED ORDER — MUPIROCIN 2 % EX OINT
1.0000 "application " | TOPICAL_OINTMENT | Freq: Two times a day (BID) | CUTANEOUS | Status: DC
  Administered 2019-04-17: 1 via NASAL
  Filled 2019-04-17: qty 22

## 2019-04-17 MED ORDER — METOPROLOL TARTRATE 50 MG PO TABS
100.0000 mg | ORAL_TABLET | Freq: Two times a day (BID) | ORAL | Status: DC
  Administered 2019-04-18 – 2019-04-30 (×24): 100 mg via ORAL
  Filled 2019-04-17 (×25): qty 2

## 2019-04-17 MED ORDER — VITAMIN D 25 MCG (1000 UNIT) PO TABS
2000.0000 [IU] | ORAL_TABLET | Freq: Every day | ORAL | Status: DC
  Administered 2019-04-18 – 2019-04-30 (×12): 2000 [IU] via ORAL
  Filled 2019-04-17 (×12): qty 2

## 2019-04-17 MED ORDER — SPIRONOLACTONE 25 MG PO TABS
25.0000 mg | ORAL_TABLET | Freq: Every day | ORAL | Status: DC
  Administered 2019-04-18 – 2019-04-30 (×12): 25 mg via ORAL
  Filled 2019-04-17 (×12): qty 1

## 2019-04-17 MED ORDER — FENTANYL CITRATE (PF) 100 MCG/2ML IJ SOLN
100.0000 ug | INTRAMUSCULAR | Status: AC | PRN
  Administered 2019-04-17 (×3): 100 ug via INTRAVENOUS
  Filled 2019-04-17 (×3): qty 2

## 2019-04-17 MED ORDER — HYDROMORPHONE HCL 1 MG/ML IJ SOLN: 1.0000 mg | Freq: Once | INTRAMUSCULAR | Status: DC

## 2019-04-17 MED ORDER — ACETAMINOPHEN 325 MG PO TABS: 650.0000 mg | ORAL_TABLET | Freq: Four times a day (QID) | ORAL | Status: DC | PRN

## 2019-04-17 NOTE — ED Notes (Signed)
Got patient undress on the monitor did vitals patient is resting with call bell in reach

## 2019-04-17 NOTE — ED Notes (Signed)
Attempted report 

## 2019-04-17 NOTE — Consult Note (Signed)
Consult received for R ppx distal femur fracture, L ankle fracture. Ankle is suboptimally visualized on x-rays, so I requested a CT. Needs LLE short leg posterior splint with U. Please plce R knee immobilizer. Plan for ORIF R femur tomorrow. I have requested hospitalist admission at [facility]. NPO after MN, hold chemical DVT ppx. Full consult note to follow in am.

## 2019-04-17 NOTE — Progress Notes (Signed)
Patient arrived to floor, pain rated 3/10. Vitals stable. No c/o. Will page admitting and on call for pain meds, none order. Pt requesting cpap & pain meds. Will continue to monitor.

## 2019-04-17 NOTE — ED Notes (Signed)
2 unsuccessful attempts to draw labs; phlebotomy called

## 2019-04-17 NOTE — H&P (Signed)
History and Physical    Jessica [name] VQQ:[id number] DOB: August 16, 1946 DOA: 04/17/2019  PCP: Cari Caraway, MD   Patient coming from: Home.  I have personally briefly reviewed patient's old medical records in [place]  Chief Complaint: Fall.  HPI: Jessica [name] is a 73 y.o. female with medical history significant of anemia, anxiety/depression, osteoarthritis, cataracts, glaucoma, type 2 diabetes mellitus, history of diastolic dysfunction, history of migraine headaches, heart murmur, urolithiasis, hyperlipidemia, hypertension, diabetic peripheral neuropathy, obesity, sleep apnea, history of TIA, history of palpitations due to tachycardia who is coming to the emergency department after having a fall at home shortly after taking a shower.  The patient states that she got out of the shower, went to her bedroom and after a short period she returned to the bathroom without her walker, slipped and fell hitting her right hip and right knee area.  She also seems to have some trauma on her left ankle.  She denies any sort of prodromal symptoms and states that the fall was an accident.  She denies fever, chills, sore throat, rhinorrhea, dyspnea, wheezing or hemoptysis.  No chest pain, palpitations, diaphoresis, PND, orthopnea, but occasionally gets lower extremity edema.  She also gets occasional palpitations.  She denies abdominal pain, nausea or emesis, diarrhea or constipation, melena or hematochezia.  No dysuria, frequency or hematuria.  ED Course: Initial vital signs temperature 98.6 F, pulse 76, respirations 17, blood pressure 147/117 mmHg and O2 sat 99% on room air.  The patient received fentanyl and Toradol in the emergency department.  Her white count was 15.9 with 82% neutrophils, hemoglobin 13.4 g/dL and platelets 202.  PT was 13.0 seconds and INR 1.0.  Glucose 133 mg/dL, all other BMP values were normal.    Imaging is significant for a spiral fracture of distal femur with  displacement and angulation.  She also has a oblique nondisplaced fracture of the distal left fibula metadiaphysis.  Chest radiograph did not show any acute pathology.  Please see images and full radiology report for further detail.  Review of Systems: As per HPI otherwise 10 point review of systems negative.   Past Medical History:  Diagnosis Date  . Anemia   . Anxiety   . Arthritis   . Cataract   . Depression   . Diabetes mellitus without complication ([place])   . Diastolic dysfunction   . Frozen shoulder   . Glaucoma   . Headache    History of migraines  . Heart murmur   . History of kidney stones   . Hyperlipidemia   . Hypertension   . Macular degeneration   . Neuropathy   . Obesity   . Sleep apnea   . Stroke [facility])    has been told by a Dr. Mort Sawyers that she may have had a mini stroke but no further testing was done to confirm  . Tachycardia     Past Surgical History:  Procedure Laterality Date  . CARDIAC CATHETERIZATION     -6/09-normal ejection fraction 65%, no angiographically significant CAD., M. Skains MD  . CESAREAN SECTION    . COLONOSCOPY    . foot spurs removed    . HYSTEROSCOPY W/D&C N/A 06/28/2017   Procedure: DILATATION AND CURETTAGE /HYSTEROSCOPY/POLYPECTOMY WITH MYOSURE;  Surgeon: Christophe Louis, MD;  Location: [facility];  Service: Gynecology;  Laterality: N/A;  . JOINT REPLACEMENT     right knee replaced  . knee spurs removed    . shoulder spurs removed    .  TONSILLECTOMY AND ADENOIDECTOMY    . UPPER GI ENDOSCOPY       reports that she has never smoked. She has never used smokeless tobacco. She reports that she does not drink alcohol or use drugs.  Allergies  Allergen Reactions  . Meloxicam     Having bleeding  . Percocet [Oxycodone-Acetaminophen] Other (See Comments)    Headache  . Tramadol   . Vicodin [Hydrocodone-Acetaminophen] Other (See Comments)    Headache   . Azithromycin Rash  . Iodine Rash    Family History  Problem Relation Age of  Onset  . Colon polyps Father   . Brain cancer Mother    Prior to Admission medications   Medication Sig Start Date End Date Taking? Authorizing Provider  acetaminophen (TYLENOL) 325 MG tablet Take 2 tablets (650 mg total) by mouth every 6 (six) hours as needed. Do not take more than 4000mg  of tylenol per day 12/19/17   Couture, Cortni S, PA-C  amoxicillin (AMOXIL) 500 MG capsule TAKE 4 TABLETS BY MOUTH 1 HOUR BEFORE APPOINTMENT 11/06/15   [provider]  B-D INS SYRINGE 0.5CC/31GX5/16 31G X 5/16" 0.5 ML MISC Use as directed per provider. 06/11/13   [provider]  Calcium Carbonate-Vitamin D 600-200 MG-UNIT TABS Take 1 tablet by mouth daily.     [provider]  clobetasol cream (TEMOVATE) 0.05 % APPLY TO AFFECTED AREAS 2 TIMES DAILY NOT TO FACE,GROIN, OR ARMPIT 11/07/15   [provider]  Coenzyme Q10 (CO Q-10) 100 MG CAPS Take 100 mg by mouth at bedtime.     [provider]  diclofenac sodium (VOLTAREN) 1 % GEL Apply 2 g topically 4 (four) times daily. 12/19/17   Couture, Cortni S, PA-C  dorzolamide-timolol (COSOPT) 22.3-6.8 MG/ML ophthalmic solution Place 1 drop into both eyes 2 (two) times daily.  08/27/14   [provider]  doxycycline (VIBRAMYCIN) 50 MG capsule 2 po daily 06/28/17   Christophe Louis, MD  fluticasone [facility]) 50 MCG/ACT nasal spray Place 1 spray into both nostrils daily as needed for allergies or rhinitis.    [provider]  furosemide (LASIX) 20 MG tablet Take 1 tablet (20 mg total) by mouth daily. 06/27/18   Jerline Pain, MD  Garlic 4193 MG CAPS Take 1,000 mg by mouth daily.     [provider]  ibuprofen (ADVIL,MOTRIN) 600 MG tablet 1 po every 6 hours prn for mild to moderate pain 06/28/17   Christophe Louis, MD  insulin lispro (HUMALOG) 100 UNIT/ML injection Inject into the skin daily. Insulin pump    [provider]  INVOKANA 300 MG TABS tablet Take 300 mg by mouth daily. 03/01/16   [provider]   Iron-Vitamin C (VITRON-C) 65-125 MG TABS Take 1 tablet by mouth at bedtime.     [provider]  losartan (COZAAR) 100 MG tablet TAKE 1 TABLET DAILY 08/09/18   Jerline Pain, MD  metFORMIN (GLUCOPHAGE-XR) 500 MG 24 hr tablet Take 500 mg by mouth daily with supper.    [provider]  metoprolol tartrate (LOPRESSOR) 100 MG tablet TAKE 1 TABLET TWICE A DAY 08/09/18   Jerline Pain, MD  Multiple Vitamins-Minerals (CENTRUM SILVER PO) Take 1 tablet by mouth daily.     [provider]  Multiple Vitamins-Minerals (PRESERVISION AREDS 2 PO) Take 1 tablet by mouth 2 (two) times daily.    [provider]  ONE TOUCH ULTRA TEST test strip 1 each by Other route as needed (Use  as directed per provider).  08/05/14   [provider]  Probiotic Product (PROBIOTIC DAILY PO) Take 1 capsule by mouth 2 (two) times daily.    [provider]  simvastatin (ZOCOR) 40 MG tablet Take 40 mg by mouth every evening.    [provider]  spironolactone (ALDACTONE) 25 MG tablet Take 1 tablet (25 mg total) by mouth daily. 07/07/18   Jerline Pain, MD  traMADol (ULTRAM) 50 MG tablet Take 50 mg by mouth at bedtime as needed for moderate pain.    [provider]  Travoprost, BAK Free, (TRAVATAN) 0.004 % SOLN ophthalmic solution Place 1 drop into both eyes at bedtime.     [provider]  Vitamin D, Ergocalciferol, 2000 units CAPS Take 2,000 Units by mouth daily.    [provider]    Physical Exam: Vitals:   04/17/19 1645 04/17/19 1700 04/17/19 1845 04/17/19 1930  BP: 109/63 (!) 114/55 (!) 134/52 (!) 120/41  Pulse: 81 82 85 92  Resp: 15 14 14 17   Temp:      TempSrc:      SpO2: 98% 97% 96% 97%  Weight:      Height:        Constitutional: NAD, calm, comfortable Eyes: PERRL, lids and conjunctivae normal ENMT: Mucous membranes are moist. Posterior pharynx clear of any exudate or lesions. Neck: normal, supple, no masses, no thyromegaly  Respiratory: clear to auscultation bilaterally, no wheezing, no crackles. Normal respiratory effort. No accessory muscle use.  Cardiovascular: Regular rate and rhythm, no murmurs / rubs / gallops. No extremity edema. 2+ pedal pulses. No carotid bruits.  Abdomen: Soft, no tenderness, no masses palpated. No hepatosplenomegaly. Bowel sounds positive.  Musculoskeletal: no clubbing / cyanosis.  Decreased ROM of lower extremities, particularly of the right.  Right lower extremity is externally rotated and slightly shorter than LLE.  No contractures. Normal muscle tone.  Skin: no rashes, lesions, ulcers on limited dermatological examination. Neurologic: CN 2-12 grossly intact. Sensation intact, DTR normal. Strength 5/5 in all 4.  Psychiatric: Normal judgment and insight. Alert and oriented x 3. Normal mood.   Labs on Admission: I have personally reviewed following labs and imaging studies  CBC: Recent Labs  Lab 04/17/19 1437  WBC 15.9*  NEUTROABS 13.1*  HGB 13.4  HCT 41.9  MCV 85.9  PLT 366   Basic Metabolic Panel: Recent Labs  Lab 04/17/19 1437  NA 137  K 4.5  CL 100  CO2 28  GLUCOSE 133*  BUN 21  CREATININE 0.84  CALCIUM 9.4   GFR: Estimated Creatinine Clearance: 68 mL/min (by C-G formula based on SCr of 0.84 mg/dL). Liver Function Tests: No results for input(s): AST, ALT, ALKPHOS, BILITOT, PROT, ALBUMIN in the last 168 hours. No results for input(s): LIPASE, AMYLASE in the last 168 hours. No results for input(s): AMMONIA in the last 168 hours. Coagulation Profile: Recent Labs  Lab 04/17/19 1437  INR 1.0   Cardiac Enzymes: No results for input(s): CKTOTAL, CKMB, CKMBINDEX, TROPONINI in the last 168 hours. BNP (last 3 results) No results for input(s): PROBNP in the last 8760 hours. HbA1C: No results for input(s): HGBA1C in the last 72 hours. CBG: No results for input(s): GLUCAP in the last 168 hours. Lipid Profile: No results for input(s): CHOL, HDL, LDLCALC, TRIG,  CHOLHDL, LDLDIRECT in the last 72 hours. Thyroid Function Tests: No results for input(s): TSH, T4TOTAL, FREET4, T3FREE, THYROIDAB in the last 72 hours. Anemia Panel: No results for input(s): VITAMINB12,  FOLATE, FERRITIN, TIBC, IRON, RETICCTPCT in the last 72 hours. Urine analysis:    Component Value Date/Time   COLORURINE YELLOW 04/11/2008 0910   APPEARANCEUR CLEAR 04/11/2008 0910   LABSPEC 1.017 04/11/2008 0910   PHURINE 6.0 04/11/2008 0910   GLUCOSEU NEGATIVE 04/11/2008 0910   HGBUR NEGATIVE 04/11/2008 0910   BILIRUBINUR NEGATIVE 04/11/2008 0910   KETONESUR NEGATIVE 04/11/2008 0910   PROTEINUR NEGATIVE 04/11/2008 0910   UROBILINOGEN 0.2 04/11/2008 0910   NITRITE NEGATIVE 04/11/2008 0910   LEUKOCYTESUR MODERATE (A) 04/11/2008 0910    Radiological Exams on Admission: Dg Ankle Complete Left  Result Date: 04/17/2019 CLINICAL DATA:  73 year old female slipped and fell on wet bathroom today. Pain. EXAM: LEFT ANKLE COMPLETE - 3+ VIEW COMPARISON:  None. FINDINGS: Oblique nondisplaced fracture of the distal left fibula metadiaphysis best demonstrated on image 2. This appears to spare the lateral malleolus where 1 or more small chronic appearing ossific fragments are noted. Generalized soft tissue swelling. Chronic appearing fragment at the medial malleolus. Preserved mortise joint alignment. Talar dome intact. Possible joint effusion. The calcaneus and visible bones of the left foot appear intact. IMPRESSION: 1. Oblique nondisplaced fracture of the distal left fibula metadiaphysis. 2. Superimposed chronic appearing fracture fragments at both the medial and lateral malleolus. No other acute fracture or dislocation identified about the left ankle. Electronically Signed   By: Genevie Ann M.D.   On: 04/17/2019 15:52   Ct Ankle Left Wo Contrast  Result Date: 04/17/2019 CLINICAL DATA:  Evaluate ankle fracture. EXAM: CT OF THE LEFT ANKLE WITHOUT CONTRAST TECHNIQUE: Multidetector CT imaging of the left  ankle was performed according to the standard protocol. Multiplanar CT image reconstructions were also generated. COMPARISON:  Radiographs, same date. FINDINGS: There is a nondisplaced oblique fracture of the distal fibular shaft at and above the level of the ankle mortise. There is also mild lateral mortise widening which may suggest a ligamentous injury. No fracture of the tibia is identified. Remote avulsion type fractures along the distal tip of the medial malleolus. The talus is intact. The subtalar joints are maintained. Mild degenerative changes. No midfoot or hindfoot fractures are identified. Moderate midfoot degenerative changes. Calcaneal spurring changes are noted. IMPRESSION: 1. Nondisplaced fracture involving the distal fibular shaft. 2. No fracture of the tibia or talus. 3. Mild lateral ankle mortise widening may suggest a ligamentous injury. Electronically Signed   By: Marijo Sanes M.D.   On: 04/17/2019 18:48   Chest Portable 1 View  Result Date: 04/17/2019 CLINICAL DATA:  Hypertension EXAM: PORTABLE CHEST 1 VIEW COMPARISON:  None. FINDINGS: The heart size is mildly enlarged. There is no acute osseous abnormality. No pneumothorax. No large pleural effusion. No significant area of consolidation. IMPRESSION: No active disease. Electronically Signed   By: Constance Holster M.D.   On: 04/17/2019 19:37   Dg Hip Unilat  With Pelvis 2-3 Views Right  Result Date: 04/17/2019 CLINICAL DATA:  73 year old female slipped and fell on wet bathroom today. Pain. EXAM: DG HIP (WITH OR WITHOUT PELVIS) 2-3V RIGHT COMPARISON:  Right hip series 12/19/2017. FINDINGS: Femoral heads remain normally located. The pelvis appears stable and intact. Grossly intact proximal left femur. The proximal right femur appears stable and intact. Chronic pelvic phleboliths. Negative visible bowel gas pattern. IMPRESSION: No acute fracture or dislocation identified about the right hip or pelvis. Electronically Signed   By: Genevie Ann  M.D.   On: 04/17/2019 15:50   Dg Femur Min 2 Views Right  Result Date: 04/17/2019 CLINICAL  DATA:  Fall. EXAM: RIGHT FEMUR 2 VIEWS COMPARISON:  12/19/2017 FINDINGS: Right knee replacement. Spiral fracture distal femur above the knee prosthesis. Displacement and angulation. No other femur fracture.  Right hip intact. IMPRESSION: Spiral fracture distal femur with displacement and angulation. Right knee replacement. Electronically Signed   By: Franchot Gallo M.D.   On: 04/17/2019 16:56    EKG: Independently reviewed. Vent. rate 80 BPM PR interval * ms QRS duration 95 ms QT/QTc 394/455 ms P-R-T axes 53 38 55 Sinus rhythm Baseline wander in lead(s) V6  Assessment/Plan Principal Problem:   Closed right femoral fracture (HCC) Scheduled for the OR [facility] tomorrow. Admit to telemetry/inpatient. Keep n.p.o. after midnight. Analgesics as needed. Antiemetics as needed. Buck's traction per protocol. Consult CM and social work. Consult PT/OT. Orthopedic surgery will evaluate/intervene tomorrow.  Active Problems:   Morbid obesity ([place])   Type 2 diabetes mellitus ([place]) Last A1c 7.2% per patient. Normally uses insulin pump. Hold insulin pump while in the hospital. Carbohydrate modified diet. Continue metformin 500 mg p.o. bedtime. Continue Invokana 300 mg p.o. daily. CBG monitoring regular insulin sliding scale.    Pure hypercholesterolemia Continue simvastatin 40 mg every evening.    Essential hypertension, benign Continue metoprolol 100 mg p.o. twice daily. Continue losartan 100 mg p.o. daily. Hold furosemide for 24 to 48 hours. Monitor blood pressure, heart rate, renal function and electrolytes.    Sleep apnea Continue CPAP at bedtime.    Glaucoma Continue with dorzolamide and timolol drops.    DVT prophylaxis: Lovenox SQ. Code Status: Full code. Family Communication:  Disposition Plan: Transfer to [facility] for orthopedic surgery evaluation and possible surgery tomorrow.  Consults called: Dr. Rod Can discussed the case with Dr. Eulis Foster. Admission status: Inpatient/telemetry.   Reubin Milan MD [facility]  04/17/2019, 8:09 PM   This document was prepared using Dragon voice recognition software and may contain some unintended transcription errors.

## 2019-04-17 NOTE — ED Provider Notes (Signed)
[facility] EMERGENCY DEPARTMENT Provider Note   CSN: [id number] Arrival date & time: 04/17/19  1349    History   Chief Complaint Chief Complaint  Patient presents with   Fall    HPI Jessica [name] is a 73 y.o. female.     HPI   She presents for evaluation of injury to her right hip, when she slipped and fell in her bathroom at home.  Typically she uses her walker, but did not have it with her, at the time.  She was able to summon help using her lifeline.  Since by EMS for evaluation, and feels less pain after receiving fentanyl.  She localizes her pain to her right hip and her left ankle.  She denies preceding symptoms, and feels like she "slipped on water."  No recent illnesses.  No recent fever, chills, nausea, vomiting, weakness or numbness.  Prior history of total right knee replacement, 10 years ago.  There are no other known modifying factors.  Past Medical History:  Diagnosis Date   Anemia    Anxiety    Arthritis    Cataract    Depression    Diabetes mellitus without complication (HCC)    Diastolic dysfunction    Frozen shoulder    Glaucoma    Headache    History of migraines   Heart murmur    History of kidney stones    Hyperlipidemia    Hypertension    Macular degeneration    Neuropathy    Obesity    Sleep apnea    Stroke [facility])    has been told by a Dr. Mort Sawyers that she may have had a mini stroke but no further testing was done to confirm   Tachycardia     Patient Active Problem List   Diagnosis Date Noted   Closed right femoral fracture ([place]) 04/17/2019   Essential hypertension, benign 09/03/2014   Morbid obesity ([place]) 03/06/2014   HTN (hypertension) 03/06/2014   Vertigo 03/06/2014   Type II or unspecified type diabetes mellitus without mention of complication, uncontrolled 03/06/2014   Pure hypercholesterolemia 03/06/2014    Past Surgical History:  Procedure Laterality Date   CARDIAC  CATHETERIZATION     -6/09-normal ejection fraction 65%, no angiographically significant CAD., M. Skains MD   CESAREAN SECTION     COLONOSCOPY     foot spurs removed     HYSTEROSCOPY W/D&C N/A 06/28/2017   Procedure: DILATATION AND CURETTAGE /HYSTEROSCOPY/POLYPECTOMY WITH MYOSURE;  Surgeon: Christophe Louis, MD;  Location: [place] [facility];  Service: Gynecology;  Laterality: N/A;   JOINT REPLACEMENT     right knee replaced   knee spurs removed     shoulder spurs removed     TONSILLECTOMY AND ADENOIDECTOMY     UPPER GI ENDOSCOPY       OB History   No obstetric history on file.      Home Medications    Prior to Admission medications   Medication Sig Start Date End Date Taking? Authorizing Provider  acetaminophen (TYLENOL) 325 MG tablet Take 2 tablets (650 mg total) by mouth every 6 (six) hours as needed. Do not take more than 4000mg  of tylenol per day 12/19/17   Couture, Cortni S, PA-C  amoxicillin (AMOXIL) 500 MG capsule TAKE 4 TABLETS BY MOUTH 1 HOUR BEFORE APPOINTMENT 11/06/15   [provider]  B-D INS SYRINGE 0.5CC/31GX5/16 31G X 5/16" 0.5 ML MISC Use as directed per provider. 06/11/13   [provider]  Calcium Carbonate-Vitamin D 600-200 MG-UNIT TABS Take 1 tablet by mouth daily.     [provider]  clobetasol cream (TEMOVATE) 0.05 % APPLY TO AFFECTED AREAS 2 TIMES DAILY NOT TO FACE,GROIN, OR ARMPIT 11/07/15   [provider]  Coenzyme Q10 (CO Q-10) 100 MG CAPS Take 100 mg by mouth at bedtime.     [provider]  diclofenac sodium (VOLTAREN) 1 % GEL Apply 2 g topically 4 (four) times daily. 12/19/17   Couture, Cortni S, PA-C  dorzolamide-timolol (COSOPT) 22.3-6.8 MG/ML ophthalmic solution Place 1 drop into both eyes 2 (two) times daily.  08/27/14   [provider]  doxycycline (VIBRAMYCIN) 50 MG capsule 2 po daily 06/28/17   Christophe Louis, MD  fluticasone [facility]) 50 MCG/ACT nasal spray Place 1 spray into both nostrils daily as needed for  allergies or rhinitis.    [provider]  furosemide (LASIX) 20 MG tablet Take 1 tablet (20 mg total) by mouth daily. 06/27/18   Jerline Pain, MD  Garlic 3810 MG CAPS Take 1,000 mg by mouth daily.     [provider]  ibuprofen (ADVIL,MOTRIN) 600 MG tablet 1 po every 6 hours prn for mild to moderate pain 06/28/17   Christophe Louis, MD  insulin lispro (HUMALOG) 100 UNIT/ML injection Inject into the skin daily. Insulin pump    [provider]  INVOKANA 300 MG TABS tablet Take 300 mg by mouth daily. 03/01/16   [provider]  Iron-Vitamin C (VITRON-C) 65-125 MG TABS Take 1 tablet by mouth at bedtime.     [provider]  losartan (COZAAR) 100 MG tablet TAKE 1 TABLET DAILY 08/09/18   Jerline Pain, MD  metFORMIN (GLUCOPHAGE-XR) 500 MG 24 hr tablet Take 500 mg by mouth daily with supper.    [provider]  metoprolol tartrate (LOPRESSOR) 100 MG tablet TAKE 1 TABLET TWICE A DAY 08/09/18   Jerline Pain, MD  Multiple Vitamins-Minerals (CENTRUM SILVER PO) Take 1 tablet by mouth daily.     [provider]  Multiple Vitamins-Minerals (PRESERVISION AREDS 2 PO) Take 1 tablet by mouth 2 (two) times daily.    [provider]  ONE TOUCH ULTRA TEST test strip 1 each by Other route as needed (Use as directed per provider).  08/05/14   [provider]  Probiotic Product (PROBIOTIC DAILY PO) Take 1 capsule by mouth 2 (two) times daily.    [provider]  simvastatin (ZOCOR) 40 MG tablet Take 40 mg by mouth every evening.    [provider]  spironolactone (ALDACTONE) 25 MG tablet Take 1 tablet (25 mg total) by mouth daily. 07/07/18   Jerline Pain, MD  traMADol (ULTRAM) 50 MG tablet Take 50 mg by mouth at bedtime as needed for moderate pain.    [provider]  Travoprost, BAK Free, (TRAVATAN) 0.004 % SOLN ophthalmic solution Place 1 drop into both eyes at bedtime.     [provider]  Vitamin D,  Ergocalciferol, 2000 units CAPS Take 2,000 Units by mouth daily.    [provider]    Family History Family History  Problem Relation Age of Onset   Colon polyps Father    Brain cancer Mother     Social History Social History   Tobacco Use   Smoking status: Never Smoker   Smokeless tobacco: Never Used  Substance Use Topics   Alcohol use: No    Comment: maybe once a year   Drug  use: No     Allergies   Meloxicam, Percocet [oxycodone-acetaminophen], Tramadol, Vicodin [hydrocodone-acetaminophen], Azithromycin, and Iodine   Review of Systems Review of Systems  All other systems reviewed and are negative.    Physical Exam Updated Vital Signs BP (!) 134/52    Pulse 85    Temp 98.6 F (37 C) (Oral)    Resp 14    Ht 4\' 11"  (1.499 m)    Wt 108 kg    LMP  (LMP Unknown)    SpO2 96%    BMI 48.07 kg/m   Physical Exam Vitals signs and nursing note reviewed.  Constitutional:      General: She is not in acute distress.    Appearance: She is well-developed. She is obese. She is not ill-appearing, toxic-appearing or diaphoretic.  HENT:     Head: Normocephalic and atraumatic.     Nose: No congestion.     Mouth/Throat:     Pharynx: No oropharyngeal exudate or posterior oropharyngeal erythema.  Eyes:     Conjunctiva/sclera: Conjunctivae normal.     Pupils: Pupils are equal, round, and reactive to light.  Neck:     Musculoskeletal: Normal range of motion and neck supple.     Trachea: Phonation normal.  Cardiovascular:     Rate and Rhythm: Normal rate and regular rhythm.  Pulmonary:     Effort: Pulmonary effort is normal.     Breath sounds: Normal breath sounds. No stridor.  Chest:     Chest wall: No tenderness.  Abdominal:     General: There is no distension.     Palpations: Abdomen is soft.     Tenderness: There is no abdominal tenderness. There is no guarding.  Musculoskeletal:     Comments: Guards against movement of right hip, and left ankle, secondary  to pain.  No gross deformities.  Right leg is externally rotated and possibly shorter than the left.  No pelvic instability.  Skin:    General: Skin is warm and dry.  Neurological:     Mental Status: She is alert and oriented to person, place, and time.     Motor: No abnormal muscle tone.  Psychiatric:        Mood and Affect: Mood normal.        Behavior: Behavior normal.        Thought Content: Thought content normal.        Judgment: Judgment normal.      ED Treatments / Results  Labs (all labs ordered are listed, but only abnormal results are displayed) Labs Reviewed  BASIC METABOLIC PANEL - Abnormal; Notable for the following components:      Result Value   Glucose, Bld 133 (*)    All other components within normal limits  CBC WITH DIFFERENTIAL/PLATELET - Abnormal; Notable for the following components:   WBC 15.9 (*)    Neutro Abs 13.1 (*)    Monocytes Absolute 1.1 (*)    Abs Immature Granulocytes 0.11 (*)    All other components within normal limits  SARS CORONAVIRUS 2 (HOSPITAL ORDER, [place] LAB)  PROTIME-INR  HIV ANTIBODY (ROUTINE TESTING W REFLEX)  CBC WITH DIFFERENTIAL/PLATELET  COMPREHENSIVE METABOLIC PANEL  TYPE AND SCREEN  ABO/RH    EKG EKG Interpretation  Date/Time:  Tuesday April 17 2019 14:52:26 EDT Ventricular Rate:  80 PR Interval:    QRS Duration: 95 QT Interval:  394 QTC Calculation: 455 R Axis:   38 Text Interpretation:  Sinus rhythm  Baseline wander in lead(s) V6 since last tracing no significant change Confirmed by Daleen Bo [phone]) on 04/17/2019 3:11:44 PM   Radiology Dg Ankle Complete Left  Result Date: 04/17/2019 CLINICAL DATA:  73 year old female slipped and fell on wet bathroom today. Pain. EXAM: LEFT ANKLE COMPLETE - 3+ VIEW COMPARISON:  None. FINDINGS: Oblique nondisplaced fracture of the distal left fibula metadiaphysis best demonstrated on image 2. This appears to spare the lateral malleolus where 1 or  more small chronic appearing ossific fragments are noted. Generalized soft tissue swelling. Chronic appearing fragment at the medial malleolus. Preserved mortise joint alignment. Talar dome intact. Possible joint effusion. The calcaneus and visible bones of the left foot appear intact. IMPRESSION: 1. Oblique nondisplaced fracture of the distal left fibula metadiaphysis. 2. Superimposed chronic appearing fracture fragments at both the medial and lateral malleolus. No other acute fracture or dislocation identified about the left ankle. Electronically Signed   By: Genevie Ann M.D.   On: 04/17/2019 15:52   Ct Ankle Left Wo Contrast  Result Date: 04/17/2019 CLINICAL DATA:  Evaluate ankle fracture. EXAM: CT OF THE LEFT ANKLE WITHOUT CONTRAST TECHNIQUE: Multidetector CT imaging of the left ankle was performed according to the standard protocol. Multiplanar CT image reconstructions were also generated. COMPARISON:  Radiographs, same date. FINDINGS: There is a nondisplaced oblique fracture of the distal fibular shaft at and above the level of the ankle mortise. There is also mild lateral mortise widening which may suggest a ligamentous injury. No fracture of the tibia is identified. Remote avulsion type fractures along the distal tip of the medial malleolus. The talus is intact. The subtalar joints are maintained. Mild degenerative changes. No midfoot or hindfoot fractures are identified. Moderate midfoot degenerative changes. Calcaneal spurring changes are noted. IMPRESSION: 1. Nondisplaced fracture involving the distal fibular shaft. 2. No fracture of the tibia or talus. 3. Mild lateral ankle mortise widening may suggest a ligamentous injury. Electronically Signed   By: Marijo Sanes M.D.   On: 04/17/2019 18:48   Dg Hip Unilat  With Pelvis 2-3 Views Right  Result Date: 04/17/2019 CLINICAL DATA:  73 year old female slipped and fell on wet bathroom today. Pain. EXAM: DG HIP (WITH OR WITHOUT PELVIS) 2-3V RIGHT COMPARISON:   Right hip series 12/19/2017. FINDINGS: Femoral heads remain normally located. The pelvis appears stable and intact. Grossly intact proximal left femur. The proximal right femur appears stable and intact. Chronic pelvic phleboliths. Negative visible bowel gas pattern. IMPRESSION: No acute fracture or dislocation identified about the right hip or pelvis. Electronically Signed   By: Genevie Ann M.D.   On: 04/17/2019 15:50   Dg Femur Min 2 Views Right  Result Date: 04/17/2019 CLINICAL DATA:  Fall. EXAM: RIGHT FEMUR 2 VIEWS COMPARISON:  12/19/2017 FINDINGS: Right knee replacement. Spiral fracture distal femur above the knee prosthesis. Displacement and angulation. No other femur fracture.  Right hip intact. IMPRESSION: Spiral fracture distal femur with displacement and angulation. Right knee replacement. Electronically Signed   By: Franchot Gallo M.D.   On: 04/17/2019 16:56    Procedures Procedures (including critical care time)  Medications Ordered in ED Medications  fentaNYL (SUBLIMAZE) injection 100 mcg (100 mcg Intravenous Given 04/17/19 1501)  acetaminophen (TYLENOL) tablet 650 mg (has no administration in time range)    Or  acetaminophen (TYLENOL) suppository 650 mg (has no administration in time range)  prochlorperazine (COMPAZINE) injection 10 mg (has no administration in time range)  ondansetron (ZOFRAN) injection 4 mg (4 mg Intravenous Given 04/17/19 1459)  Initial Impression / Assessment and Plan / ED Course  I have reviewed the triage vital signs and the nursing notes.  Pertinent labs & imaging results that were available during my care of the patient were reviewed by me and considered in my medical decision making (see chart for details).  Clinical Course as of Apr 17 1911  Tue Apr 17, 2019  1755 DG Hip Unilat  With Pelvis 2-3 Views Right [EW]  1540 Radiographic images reviewed by me, hips and pelvis normal.  Left ankle with distal left fracture, nondisplaced.  Right Femur fracture  spiral, above knee prosthesis.   [EW]  1757 Normal  CBC WITH DIFFERENTIAL(!) [EW]  0867 Normal except glucose high  Basic metabolic panel(!) [EW]    Clinical Course User Index [EW] Daleen Bo, MD        Patient Vitals for the past 24 hrs:  BP Temp Temp src Pulse Resp SpO2 Height Weight  04/17/19 1845 (!) 134/52 -- -- 85 14 96 % -- --  04/17/19 1700 (!) 114/55 -- -- 82 14 97 % -- --  04/17/19 1645 109/63 -- -- 81 15 98 % -- --  04/17/19 1615 -- -- -- 81 19 97 % -- --  04/17/19 1408 -- -- -- -- -- -- 4\' 11"  (1.499 m) 108 kg  04/17/19 1405 -- -- -- -- -- 99 % -- --  04/17/19 1351 (!) 147/117 98.6 F (37 C) Oral 76 17 99 % -- --    6:12 PM Reevaluation with update and discussion. After initial assessment and treatment, an updated evaluation reveals she remains uncomfortable but is calm.  Findings discussed with the patient and all questions were answered. Daleen Bo   Medical Decision Making: Mechanical fall with right femur and left fibula fractures.  Will require surgical management of the right femur fracture.  Orthopedics has been contacted and will plan on operating tomorrow and request admission by hospitalist service.  Hospitalist consulted and will see patient admit her.  The orthopedist requested order a CT of the left ankle, and a splint on the left ankle.  CRITICAL CARE-no Performed by: Daleen Bo  Nursing Notes Reviewed/ Care Coordinated Applicable Imaging Reviewed Interpretation of Laboratory Data incorporated into ED treatment   Plan: Admit   Final Clinical Impressions(s) / ED Diagnoses   Final diagnoses:  Closed right femoral fracture [facility])    ED Discharge Orders    None       Daleen Bo, MD 04/17/19 1914

## 2019-04-17 NOTE — ED Triage Notes (Signed)
Pt BIB [facility] for eval of fall this afternoon on wet surface. Pt reports she "did a split" and is having pain in blt legs and hips. R leg is shortened and externally rotated, +CSM to R foot. Pt reports pain worse in R leg, but also endorses severe L hip/leg pain w/ movement as well.

## 2019-04-18 ENCOUNTER — Encounter (HOSPITAL_COMMUNITY): Payer: Self-pay | Admitting: Anesthesiology

## 2019-04-18 DIAGNOSIS — Z794 Long term (current) use of insulin: Secondary | ICD-10-CM

## 2019-04-18 DIAGNOSIS — E1169 Type 2 diabetes mellitus with other specified complication: Secondary | ICD-10-CM

## 2019-04-18 DIAGNOSIS — G4730 Sleep apnea, unspecified: Secondary | ICD-10-CM

## 2019-04-18 DIAGNOSIS — E7800 Pure hypercholesterolemia, unspecified: Secondary | ICD-10-CM

## 2019-04-18 DIAGNOSIS — I10 Essential (primary) hypertension: Secondary | ICD-10-CM

## 2019-04-18 LAB — HEMOGLOBIN A1C
Hgb A1c MFr Bld: 6.9 % — ABNORMAL HIGH (ref 4.8–5.6)
Mean Plasma Glucose: 151.33 mg/dL

## 2019-04-18 LAB — GLUCOSE, CAPILLARY
Glucose-Capillary: 125 mg/dL — ABNORMAL HIGH (ref 70–99)
Glucose-Capillary: 210 mg/dL — ABNORMAL HIGH (ref 70–99)
Glucose-Capillary: 199 mg/dL — ABNORMAL HIGH (ref 70–99)
Glucose-Capillary: 180 mg/dL — ABNORMAL HIGH (ref 70–99)

## 2019-04-18 LAB — TYPE AND SCREEN
ABO/RH(D): O NEG
Antibody Screen: NEGATIVE

## 2019-04-18 LAB — SURGICAL PCR SCREEN
MRSA, PCR: NEGATIVE
Staphylococcus aureus: NEGATIVE

## 2019-04-18 MED ORDER — ADULT MULTIVITAMIN W/MINERALS CH
1.0000 | ORAL_TABLET | Freq: Every day | ORAL | Status: DC
  Administered 2019-04-20 – 2019-04-30 (×11): 1 via ORAL
  Filled 2019-04-18 (×12): qty 1

## 2019-04-18 MED ORDER — FENTANYL CITRATE (PF) 100 MCG/2ML IJ SOLN
100.0000 ug | INTRAMUSCULAR | Status: DC | PRN
  Administered 2019-04-18 (×3): 100 ug via INTRAVENOUS
  Filled 2019-04-18 (×3): qty 2

## 2019-04-18 MED ORDER — INSULIN ASPART 100 UNIT/ML ~~LOC~~ SOLN
0.0000 [IU] | Freq: Three times a day (TID) | SUBCUTANEOUS | Status: DC
  Administered 2019-04-19 – 2019-04-20 (×2): 3 [IU] via SUBCUTANEOUS
  Administered 2019-04-20: 5 [IU] via SUBCUTANEOUS

## 2019-04-18 MED ORDER — ENOXAPARIN SODIUM 40 MG/0.4ML ~~LOC~~ SOLN
40.0000 mg | Freq: Once | SUBCUTANEOUS | Status: AC
  Administered 2019-04-18: 40 mg via SUBCUTANEOUS
  Filled 2019-04-18: qty 0.4

## 2019-04-18 MED ORDER — PRO-STAT SUGAR FREE PO LIQD
30.0000 mL | Freq: Every day | ORAL | Status: DC
  Administered 2019-04-18 – 2019-04-30 (×12): 30 mL via ORAL
  Filled 2019-04-18 (×12): qty 30

## 2019-04-18 MED ORDER — ENSURE MAX PROTEIN PO LIQD
11.0000 [oz_av] | Freq: Every day | ORAL | Status: DC
  Administered 2019-04-18 – 2019-04-29 (×10): 11 [oz_av] via ORAL
  Filled 2019-04-18 (×13): qty 330

## 2019-04-18 MED ORDER — FENTANYL CITRATE (PF) 100 MCG/2ML IJ SOLN
50.0000 ug | INTRAMUSCULAR | Status: DC | PRN
  Administered 2019-04-18 – 2019-04-19 (×4): 50 ug via INTRAVENOUS
  Administered 2019-04-19: 100 ug via INTRAVENOUS
  Filled 2019-04-18 (×5): qty 2

## 2019-04-18 NOTE — Progress Notes (Signed)
Orthopedic Tech Progress Note Patient Details:  Jessica [name] 05-17-1946 [id number]  Ortho Devices Type of Ortho Device: Post (short leg) splint, Stirrup splint Ortho Device/Splint Location: lle Ortho Device/Splint Interventions: Ordered, Application, Adjustment   Post Interventions Patient Tolerated: Well Instructions Provided: Care of device, Adjustment of device   Karolee Stamps 04/18/2019, 2:45 AM

## 2019-04-18 NOTE — Consult Note (Signed)
ORTHOPAEDIC CONSULTATION  REQUESTING PHYSICIAN: Desiree Hane, MD  PCP:  Cari Caraway, MD  Chief Complaint: Bilateral lower extremity injury.  HPI: Jessica [name] is a 73 y.o. female who complains of right knee pain and left ankle pain, after she slipped and fell at home yesterday.  She had right knee pain and left ankle pain.  She was unable to bear weight.  She was brought to the emergency department, where x-rays revealed a displaced periprosthetic distal femur fracture with intact implant, as well as left Weber B ankle fracture.  CT scan was obtained, showing evidence of previous trauma to the medial malleolus.  The radiologist read the CT scan as having some shifting of the talus, however I personally reviewed this imaging and I did also discuss it with orthopedic traumatologist Dr. Doreatha Martin, and we both disagree with the radiologist; there is NO shifting or tilting of the talus.  The patient was admitted to the hospitalist for perioperative or stratification and medical optimization.  Past Medical History:  Diagnosis Date  . Anemia   . Anxiety   . Arthritis   . Cataract   . Depression   . Diabetes mellitus without complication ([place])   . Diastolic dysfunction   . Frozen shoulder   . Glaucoma   . Headache    History of migraines  . Heart murmur   . History of kidney stones   . Hyperlipidemia   . Hypertension   . Macular degeneration   . Neuropathy   . Obesity   . Sleep apnea   . Stroke [facility])    has been told by a Dr. Mort Sawyers that she may have had a mini stroke but no further testing was done to confirm  . Tachycardia    Past Surgical History:  Procedure Laterality Date  . CARDIAC CATHETERIZATION     -6/09-normal ejection fraction 65%, no angiographically significant CAD., M. Skains MD  . CESAREAN SECTION    . COLONOSCOPY    . foot spurs removed    . HYSTEROSCOPY W/D&C N/A 06/28/2017   Procedure: DILATATION AND CURETTAGE /HYSTEROSCOPY/POLYPECTOMY WITH  MYOSURE;  Surgeon: Christophe Louis, MD;  Location: [facility];  Service: Gynecology;  Laterality: N/A;  . JOINT REPLACEMENT     right knee replaced  . knee spurs removed    . shoulder spurs removed    . TONSILLECTOMY AND ADENOIDECTOMY    . UPPER GI ENDOSCOPY     Social History   Socioeconomic History  . Marital status: Single    Spouse name: Not on file  . Number of children: Not on file  . Years of education: Not on file  . Highest education level: Not on file  Occupational History  . Not on file  Social Needs  . Financial resource strain: Not on file  . Food insecurity    Worry: Not on file    Inability: Not on file  . Transportation needs    Medical: Not on file    Non-medical: Not on file  Tobacco Use  . Smoking status: Never Smoker  . Smokeless tobacco: Never Used  Substance and Sexual Activity  . Alcohol use: No    Comment: maybe once a year  . Drug use: No  . Sexual activity: Not on file  Lifestyle  . Physical activity    Days per week: Not on file    Minutes per session: Not on file  . Stress: Not on file  Relationships  . Social  connections    Talks on phone: Not on file    Gets together: Not on file    Attends religious service: Not on file    Active member of club or organization: Not on file    Attends meetings of clubs or organizations: Not on file    Relationship status: Not on file  Other Topics Concern  . Not on file  Social History Narrative  . Not on file   Family History  Problem Relation Age of Onset  . Colon polyps Father   . Brain cancer Mother    Allergies  Allergen Reactions  . Meloxicam     Having bleeding  . Percocet [Oxycodone-Acetaminophen] Other (See Comments)    Headache  . Tramadol   . Vicodin [Hydrocodone-Acetaminophen] Other (See Comments)    Headache   . Azithromycin Rash  . Iodine Rash   Prior to Admission medications   Medication Sig Start Date End Date Taking? Authorizing Provider  acetaminophen (TYLENOL) 325 MG  tablet Take 2 tablets (650 mg total) by mouth every 6 (six) hours as needed. Do not take more than 4000mg  of tylenol per day Patient taking differently: Take 650 mg by mouth every 6 (six) hours as needed for mild pain. Do not take more than 4000mg  of tylenol per day 12/19/17  Yes Couture, Cortni S, PA-C  Coenzyme Q10 (CO Q-10) 100 MG CAPS Take 100 mg by mouth at bedtime.    Yes [provider]  diclofenac sodium (VOLTAREN) 1 % GEL Apply 2 g topically 4 (four) times daily. 12/19/17  Yes Couture, Cortni S, PA-C  dorzolamide-timolol (COSOPT) 22.3-6.8 MG/ML ophthalmic solution Place 1 drop into both eyes 2 (two) times daily.  08/27/14  Yes [provider]  furosemide (LASIX) 20 MG tablet Take 1 tablet (20 mg total) by mouth daily. 06/27/18  Yes Jerline Pain, MD  Garlic 6270 MG CAPS Take 1,000 mg by mouth daily.    Yes [provider]  ibuprofen (ADVIL,MOTRIN) 600 MG tablet 1 po every 6 hours prn for mild to moderate pain 06/28/17  Yes Christophe Louis, MD  insulin lispro (HUMALOG) 100 UNIT/ML injection Inject into the skin daily. Insulin pump   Yes [provider]  INVOKANA 300 MG TABS tablet Take 300 mg by mouth daily. 03/01/16  Yes [provider]  Iron-Vitamin C (VITRON-C) 65-125 MG TABS Take 1 tablet by mouth at bedtime.    Yes [provider]  losartan (COZAAR) 100 MG tablet TAKE 1 TABLET DAILY Patient taking differently: Take 100 mg by mouth daily.  08/09/18  Yes Jerline Pain, MD  metFORMIN (GLUCOPHAGE-XR) 500 MG 24 hr tablet Take 500 mg by mouth at bedtime.    Yes [provider]  metoprolol tartrate (LOPRESSOR) 100 MG tablet TAKE 1 TABLET TWICE A DAY Patient taking differently: Take 100 mg by mouth 2 (two) times daily.  08/09/18  Yes Jerline Pain, MD  Multiple Vitamins-Minerals (PRESERVISION AREDS 2 PO) Take 1 tablet by mouth 2 (two) times daily.   Yes [provider]  simvastatin (ZOCOR) 40 MG tablet Take 40 mg by mouth every  evening.   Yes [provider]  spironolactone (ALDACTONE) 25 MG tablet Take 1 tablet (25 mg total) by mouth daily. 07/07/18  Yes Jerline Pain, MD  Travoprost, BAK Free, (TRAVATAN) 0.004 % SOLN ophthalmic solution Place 1 drop into both eyes at bedtime.    Yes [provider]  Vitamin D, Ergocalciferol, 2000 units CAPS Take  2,000 Units by mouth daily.   Yes [provider]  B-D INS SYRINGE 0.5CC/31GX5/16 31G X 5/16" 0.5 ML MISC Use as directed per provider. 06/11/13   [provider]  ONE TOUCH ULTRA TEST test strip 1 each by Other route as needed (Use as directed per provider).  08/05/14   [provider]   Dg Ankle Complete Left  Result Date: 04/17/2019 CLINICAL DATA:  73 year old female slipped and fell on wet bathroom today. Pain. EXAM: LEFT ANKLE COMPLETE - 3+ VIEW COMPARISON:  None. FINDINGS: Oblique nondisplaced fracture of the distal left fibula metadiaphysis best demonstrated on image 2. This appears to spare the lateral malleolus where 1 or more small chronic appearing ossific fragments are noted. Generalized soft tissue swelling. Chronic appearing fragment at the medial malleolus. Preserved mortise joint alignment. Talar dome intact. Possible joint effusion. The calcaneus and visible bones of the left foot appear intact. IMPRESSION: 1. Oblique nondisplaced fracture of the distal left fibula metadiaphysis. 2. Superimposed chronic appearing fracture fragments at both the medial and lateral malleolus. No other acute fracture or dislocation identified about the left ankle. Electronically Signed   By: Genevie Ann M.D.   On: 04/17/2019 15:52   Ct Ankle Left Wo Contrast  Result Date: 04/17/2019 CLINICAL DATA:  Evaluate ankle fracture. EXAM: CT OF THE LEFT ANKLE WITHOUT CONTRAST TECHNIQUE: Multidetector CT imaging of the left ankle was performed according to the standard protocol. Multiplanar CT image reconstructions were also generated. COMPARISON:   Radiographs, same date. FINDINGS: There is a nondisplaced oblique fracture of the distal fibular shaft at and above the level of the ankle mortise. There is also mild lateral mortise widening which may suggest a ligamentous injury. No fracture of the tibia is identified. Remote avulsion type fractures along the distal tip of the medial malleolus. The talus is intact. The subtalar joints are maintained. Mild degenerative changes. No midfoot or hindfoot fractures are identified. Moderate midfoot degenerative changes. Calcaneal spurring changes are noted. IMPRESSION: 1. Nondisplaced fracture involving the distal fibular shaft. 2. No fracture of the tibia or talus. 3. Mild lateral ankle mortise widening may suggest a ligamentous injury. Electronically Signed   By: Marijo Sanes M.D.   On: 04/17/2019 18:48   Chest Portable 1 View  Result Date: 04/17/2019 CLINICAL DATA:  Hypertension EXAM: PORTABLE CHEST 1 VIEW COMPARISON:  None. FINDINGS: The heart size is mildly enlarged. There is no acute osseous abnormality. No pneumothorax. No large pleural effusion. No significant area of consolidation. IMPRESSION: No active disease. Electronically Signed   By: Constance Holster M.D.   On: 04/17/2019 19:37   Dg Hip Unilat  With Pelvis 2-3 Views Right  Result Date: 04/17/2019 CLINICAL DATA:  73 year old female slipped and fell on wet bathroom today. Pain. EXAM: DG HIP (WITH OR WITHOUT PELVIS) 2-3V RIGHT COMPARISON:  Right hip series 12/19/2017. FINDINGS: Femoral heads remain normally located. The pelvis appears stable and intact. Grossly intact proximal left femur. The proximal right femur appears stable and intact. Chronic pelvic phleboliths. Negative visible bowel gas pattern. IMPRESSION: No acute fracture or dislocation identified about the right hip or pelvis. Electronically Signed   By: Genevie Ann M.D.   On: 04/17/2019 15:50   Dg Femur Min 2 Views Right  Result Date: 04/17/2019 CLINICAL DATA:  Fall. EXAM: RIGHT FEMUR 2  VIEWS COMPARISON:  12/19/2017 FINDINGS: Right knee replacement. Spiral fracture distal femur above the knee prosthesis. Displacement and angulation. No other femur fracture.  Right hip intact. IMPRESSION: Spiral fracture  distal femur with displacement and angulation. Right knee replacement. Electronically Signed   By: Franchot Gallo M.D.   On: 04/17/2019 16:56    Positive ROS: All other systems have been reviewed and were otherwise negative with the exception of those mentioned in the HPI and as above.  Physical Exam: General: Alert, no acute distress Cardiovascular: No pedal edema Respiratory: No cyanosis, no use of accessory musculature GI: No organomegaly, abdomen is soft and non-tender Skin: No lesions in the area of chief complaint Neurologic: Sensation intact distally Psychiatric: Patient is competent for consent with normal mood and affect Lymphatic: No axillary or cervical lymphadenopathy  MUSCULOSKELETAL: Examination of the right lower extremity reveals an intact anterior knee incision.  She is shortened and rotated.  She has intact dorsiflexion, plantarflexion, great toe extension, with strength limited by pain.  Palpable pedal pulses present.  No sensory deficit.  Examination of the left lower extremity reveals that she is in a short leg splint.  She is neurovascularly intact.  Assessment: Displaced right periprosthetic distal femur fracture. Nondisplaced left Weber B ankle fracture. Morbid obesity. Diabetes.  Plan: I discussed the findings with the patient.  We discussed that the right periprosthetic distal femur fracture requires operative stabilization.  We discussed the risk, benefits, and alternatives to ORIF of the femur.  Certainly, she is at increased risk of wound and fracture healing complications due to her body habitus and diabetes.  The left ankle fracture is amenable to conservative treatment.  Continue splint. We will plan for close follow-up with x-rays.   Postoperatively, she will be nonweightbearing bilateral lower extremities.  We will plan for surgery tomorrow.  She may have a diet today and be n.p.o. after midnight.  1 dose of Lovenox today, and then hold chemical DVT prophylaxis until postoperatively.  The risks, benefits, and alternatives were discussed with the patient. There are risks associated with the surgery including, but not limited to, problems with anesthesia (death), infection, differences in leg length/angulation/rotation, fracture of bones, loosening or failure of implants, malunion, nonunion, hematoma (blood accumulation) which may require surgical drainage, blood clots, pulmonary embolism, nerve injury (foot drop), and blood vessel injury. The patient understands these risks and elects to proceed.    Bertram Savin, MD Cell [phone]    04/18/2019 10:21 AM

## 2019-04-18 NOTE — Progress Notes (Signed)
Initial Nutrition Assessment  RD working remotely.   DOCUMENTATION CODES:   Morbid obesity  INTERVENTION:  - will order Ensure Max once/day, each supplement provides 150 kcal and 30 grams protein.  - will order 30 ml prostat once/day, each supplement provides 100 kcal and 15 grams protein. - will order daily multivitamin with minerals.  - continue to encourage PO intakes.    NUTRITION DIAGNOSIS:   Increased nutrient needs related to acute illness, post-op healing as evidenced by estimated needs.  GOAL:   Patient will meet greater than or equal to 90% of their needs  MONITOR:   Diet advancement, PO intake, Supplement acceptance, Labs, Weight trends, Skin  REASON FOR ASSESSMENT:   Consult Assessment of nutrition requirement/status  ASSESSMENT:   73 year-old female with medical history significant of anemia, anxiety/depression, osteoarthritis, cataracts, glaucoma, type 2 DM, hx of diastolic dysfunction, migraine headaches, heart murmur, urolithiasis, hyperlipidemia, HTN, diabetic peripheral neuropathy, obesity, sleep apnea, hx of TIA, and hx of palpitations d/t tachycardia. She presented to the ED after a fall at home while attempting to get into the shower. She hit her R hip and R knee.  Diet advanced from NPO to Carb Modified ~1 hour ago with plan to return to NPO status at midnight. Patient has not had anything to eat since diet advancement. She reports good appetite at home and that appetite was at baseline PTA. She denies any issues with chewing or swallowing. She does not experience any discomfort or nausea with PO intakes at baseline.   Patient denies any recent weight changes. Per chart review, current weight is 238 lb and the most recent recorded weight PTA was on 07/07/18 when she weighed 241 lb.   Per notes: closed R femoral fx with plan for surgery tomorrow (6/25).    Medications reviewed; 2000 units vitamin D3/day, sliding scale novolog, 500 mg glucophage/day, 25  mg aldactone/day.  Labs reviewed; CBG: 125 mg/dl.      NUTRITION - FOCUSED PHYSICAL EXAM:  unable to complete at this time.   Diet Order:   Diet Order            Diet NPO time specified  Diet effective midnight        Diet Carb Modified Fluid consistency: Thin; Room service appropriate? Yes  Diet effective now              EDUCATION NEEDS:   No education needs have been identified at this time  Skin:  Skin Assessment: Reviewed RN Assessment  Last BM:  6/23  Height:   Ht Readings from Last 1 Encounters:  04/17/19 4\' 11"  (1.499 m)    Weight:   Wt Readings from Last 1 Encounters:  04/17/19 108 kg    Ideal Body Weight:     BMI:  Body mass index is 48.07 kg/m.  Estimated Nutritional Needs:   Kcal:  1850-2050 kcal  Protein:  95-105 grams  Fluid:  >/= 2 L/day      Jarome Matin, MS, RD, LDN, [facility] Inpatient Clinical Dietitian Pager # [phone] After hours/weekend pager # [phone]

## 2019-04-18 NOTE — H&P (View-Only) (Signed)
ORTHOPAEDIC CONSULTATION  REQUESTING PHYSICIAN: Desiree Hane, MD  PCP:  Cari Caraway, MD  Chief Complaint: Bilateral lower extremity injury.  HPI: Jessica [name] is a 73 y.o. female who complains of right knee pain and left ankle pain, after she slipped and fell at home yesterday.  She had right knee pain and left ankle pain.  She was unable to bear weight.  She was brought to the emergency department, where x-rays revealed a displaced periprosthetic distal femur fracture with intact implant, as well as left Weber B ankle fracture.  CT scan was obtained, showing evidence of previous trauma to the medial malleolus.  The radiologist read the CT scan as having some shifting of the talus, however I personally reviewed this imaging and I did also discuss it with orthopedic traumatologist Dr. Doreatha Martin, and we both disagree with the radiologist; there is NO shifting or tilting of the talus.  The patient was admitted to the hospitalist for perioperative or stratification and medical optimization.  Past Medical History:  Diagnosis Date  . Anemia   . Anxiety   . Arthritis   . Cataract   . Depression   . Diabetes mellitus without complication ([place])   . Diastolic dysfunction   . Frozen shoulder   . Glaucoma   . Headache    History of migraines  . Heart murmur   . History of kidney stones   . Hyperlipidemia   . Hypertension   . Macular degeneration   . Neuropathy   . Obesity   . Sleep apnea   . Stroke [facility])    has been told by a Dr. Mort Sawyers that she may have had a mini stroke but no further testing was done to confirm  . Tachycardia    Past Surgical History:  Procedure Laterality Date  . CARDIAC CATHETERIZATION     -6/09-normal ejection fraction 65%, no angiographically significant CAD., M. Skains MD  . CESAREAN SECTION    . COLONOSCOPY    . foot spurs removed    . HYSTEROSCOPY W/D&C N/A 06/28/2017   Procedure: DILATATION AND CURETTAGE /HYSTEROSCOPY/POLYPECTOMY WITH  MYOSURE;  Surgeon: Christophe Louis, MD;  Location: [facility];  Service: Gynecology;  Laterality: N/A;  . JOINT REPLACEMENT     right knee replaced  . knee spurs removed    . shoulder spurs removed    . TONSILLECTOMY AND ADENOIDECTOMY    . UPPER GI ENDOSCOPY     Social History   Socioeconomic History  . Marital status: Single    Spouse name: Not on file  . Number of children: Not on file  . Years of education: Not on file  . Highest education level: Not on file  Occupational History  . Not on file  Social Needs  . Financial resource strain: Not on file  . Food insecurity    Worry: Not on file    Inability: Not on file  . Transportation needs    Medical: Not on file    Non-medical: Not on file  Tobacco Use  . Smoking status: Never Smoker  . Smokeless tobacco: Never Used  Substance and Sexual Activity  . Alcohol use: No    Comment: maybe once a year  . Drug use: No  . Sexual activity: Not on file  Lifestyle  . Physical activity    Days per week: Not on file    Minutes per session: Not on file  . Stress: Not on file  Relationships  . Social  connections    Talks on phone: Not on file    Gets together: Not on file    Attends religious service: Not on file    Active member of club or organization: Not on file    Attends meetings of clubs or organizations: Not on file    Relationship status: Not on file  Other Topics Concern  . Not on file  Social History Narrative  . Not on file   Family History  Problem Relation Age of Onset  . Colon polyps Father   . Brain cancer Mother    Allergies  Allergen Reactions  . Meloxicam     Having bleeding  . Percocet [Oxycodone-Acetaminophen] Other (See Comments)    Headache  . Tramadol   . Vicodin [Hydrocodone-Acetaminophen] Other (See Comments)    Headache   . Azithromycin Rash  . Iodine Rash   Prior to Admission medications   Medication Sig Start Date End Date Taking? Authorizing Provider  acetaminophen (TYLENOL) 325 MG  tablet Take 2 tablets (650 mg total) by mouth every 6 (six) hours as needed. Do not take more than 4000mg  of tylenol per day Patient taking differently: Take 650 mg by mouth every 6 (six) hours as needed for mild pain. Do not take more than 4000mg  of tylenol per day 12/19/17  Yes Couture, Cortni S, PA-C  Coenzyme Q10 (CO Q-10) 100 MG CAPS Take 100 mg by mouth at bedtime.    Yes [provider]  diclofenac sodium (VOLTAREN) 1 % GEL Apply 2 g topically 4 (four) times daily. 12/19/17  Yes Couture, Cortni S, PA-C  dorzolamide-timolol (COSOPT) 22.3-6.8 MG/ML ophthalmic solution Place 1 drop into both eyes 2 (two) times daily.  08/27/14  Yes [provider]  furosemide (LASIX) 20 MG tablet Take 1 tablet (20 mg total) by mouth daily. 06/27/18  Yes Jerline Pain, MD  Garlic 3151 MG CAPS Take 1,000 mg by mouth daily.    Yes [provider]  ibuprofen (ADVIL,MOTRIN) 600 MG tablet 1 po every 6 hours prn for mild to moderate pain 06/28/17  Yes Christophe Louis, MD  insulin lispro (HUMALOG) 100 UNIT/ML injection Inject into the skin daily. Insulin pump   Yes [provider]  INVOKANA 300 MG TABS tablet Take 300 mg by mouth daily. 03/01/16  Yes [provider]  Iron-Vitamin C (VITRON-C) 65-125 MG TABS Take 1 tablet by mouth at bedtime.    Yes [provider]  losartan (COZAAR) 100 MG tablet TAKE 1 TABLET DAILY Patient taking differently: Take 100 mg by mouth daily.  08/09/18  Yes Jerline Pain, MD  metFORMIN (GLUCOPHAGE-XR) 500 MG 24 hr tablet Take 500 mg by mouth at bedtime.    Yes [provider]  metoprolol tartrate (LOPRESSOR) 100 MG tablet TAKE 1 TABLET TWICE A DAY Patient taking differently: Take 100 mg by mouth 2 (two) times daily.  08/09/18  Yes Jerline Pain, MD  Multiple Vitamins-Minerals (PRESERVISION AREDS 2 PO) Take 1 tablet by mouth 2 (two) times daily.   Yes [provider]  simvastatin (ZOCOR) 40 MG tablet Take 40 mg by mouth every  evening.   Yes [provider]  spironolactone (ALDACTONE) 25 MG tablet Take 1 tablet (25 mg total) by mouth daily. 07/07/18  Yes Jerline Pain, MD  Travoprost, BAK Free, (TRAVATAN) 0.004 % SOLN ophthalmic solution Place 1 drop into both eyes at bedtime.    Yes [provider]  Vitamin D, Ergocalciferol, 2000 units CAPS Take  2,000 Units by mouth daily.   Yes [provider]  B-D INS SYRINGE 0.5CC/31GX5/16 31G X 5/16" 0.5 ML MISC Use as directed per provider. 06/11/13   [provider]  ONE TOUCH ULTRA TEST test strip 1 each by Other route as needed (Use as directed per provider).  08/05/14   [provider]   Dg Ankle Complete Left  Result Date: 04/17/2019 CLINICAL DATA:  73 year old female slipped and fell on wet bathroom today. Pain. EXAM: LEFT ANKLE COMPLETE - 3+ VIEW COMPARISON:  None. FINDINGS: Oblique nondisplaced fracture of the distal left fibula metadiaphysis best demonstrated on image 2. This appears to spare the lateral malleolus where 1 or more small chronic appearing ossific fragments are noted. Generalized soft tissue swelling. Chronic appearing fragment at the medial malleolus. Preserved mortise joint alignment. Talar dome intact. Possible joint effusion. The calcaneus and visible bones of the left foot appear intact. IMPRESSION: 1. Oblique nondisplaced fracture of the distal left fibula metadiaphysis. 2. Superimposed chronic appearing fracture fragments at both the medial and lateral malleolus. No other acute fracture or dislocation identified about the left ankle. Electronically Signed   By: Genevie Ann M.D.   On: 04/17/2019 15:52   Ct Ankle Left Wo Contrast  Result Date: 04/17/2019 CLINICAL DATA:  Evaluate ankle fracture. EXAM: CT OF THE LEFT ANKLE WITHOUT CONTRAST TECHNIQUE: Multidetector CT imaging of the left ankle was performed according to the standard protocol. Multiplanar CT image reconstructions were also generated. COMPARISON:   Radiographs, same date. FINDINGS: There is a nondisplaced oblique fracture of the distal fibular shaft at and above the level of the ankle mortise. There is also mild lateral mortise widening which may suggest a ligamentous injury. No fracture of the tibia is identified. Remote avulsion type fractures along the distal tip of the medial malleolus. The talus is intact. The subtalar joints are maintained. Mild degenerative changes. No midfoot or hindfoot fractures are identified. Moderate midfoot degenerative changes. Calcaneal spurring changes are noted. IMPRESSION: 1. Nondisplaced fracture involving the distal fibular shaft. 2. No fracture of the tibia or talus. 3. Mild lateral ankle mortise widening may suggest a ligamentous injury. Electronically Signed   By: Marijo Sanes M.D.   On: 04/17/2019 18:48   Chest Portable 1 View  Result Date: 04/17/2019 CLINICAL DATA:  Hypertension EXAM: PORTABLE CHEST 1 VIEW COMPARISON:  None. FINDINGS: The heart size is mildly enlarged. There is no acute osseous abnormality. No pneumothorax. No large pleural effusion. No significant area of consolidation. IMPRESSION: No active disease. Electronically Signed   By: Constance Holster M.D.   On: 04/17/2019 19:37   Dg Hip Unilat  With Pelvis 2-3 Views Right  Result Date: 04/17/2019 CLINICAL DATA:  73 year old female slipped and fell on wet bathroom today. Pain. EXAM: DG HIP (WITH OR WITHOUT PELVIS) 2-3V RIGHT COMPARISON:  Right hip series 12/19/2017. FINDINGS: Femoral heads remain normally located. The pelvis appears stable and intact. Grossly intact proximal left femur. The proximal right femur appears stable and intact. Chronic pelvic phleboliths. Negative visible bowel gas pattern. IMPRESSION: No acute fracture or dislocation identified about the right hip or pelvis. Electronically Signed   By: Genevie Ann M.D.   On: 04/17/2019 15:50   Dg Femur Min 2 Views Right  Result Date: 04/17/2019 CLINICAL DATA:  Fall. EXAM: RIGHT FEMUR 2  VIEWS COMPARISON:  12/19/2017 FINDINGS: Right knee replacement. Spiral fracture distal femur above the knee prosthesis. Displacement and angulation. No other femur fracture.  Right hip intact. IMPRESSION: Spiral fracture  distal femur with displacement and angulation. Right knee replacement. Electronically Signed   By: Franchot Gallo M.D.   On: 04/17/2019 16:56    Positive ROS: All other systems have been reviewed and were otherwise negative with the exception of those mentioned in the HPI and as above.  Physical Exam: General: Alert, no acute distress Cardiovascular: No pedal edema Respiratory: No cyanosis, no use of accessory musculature GI: No organomegaly, abdomen is soft and non-tender Skin: No lesions in the area of chief complaint Neurologic: Sensation intact distally Psychiatric: Patient is competent for consent with normal mood and affect Lymphatic: No axillary or cervical lymphadenopathy  MUSCULOSKELETAL: Examination of the right lower extremity reveals an intact anterior knee incision.  She is shortened and rotated.  She has intact dorsiflexion, plantarflexion, great toe extension, with strength limited by pain.  Palpable pedal pulses present.  No sensory deficit.  Examination of the left lower extremity reveals that she is in a short leg splint.  She is neurovascularly intact.  Assessment: Displaced right periprosthetic distal femur fracture. Nondisplaced left Weber B ankle fracture. Morbid obesity. Diabetes.  Plan: I discussed the findings with the patient.  We discussed that the right periprosthetic distal femur fracture requires operative stabilization.  We discussed the risk, benefits, and alternatives to ORIF of the femur.  Certainly, she is at increased risk of wound and fracture healing complications due to her body habitus and diabetes.  The left ankle fracture is amenable to conservative treatment.  Continue splint. We will plan for close follow-up with x-rays.   Postoperatively, she will be nonweightbearing bilateral lower extremities.  We will plan for surgery tomorrow.  She may have a diet today and be n.p.o. after midnight.  1 dose of Lovenox today, and then hold chemical DVT prophylaxis until postoperatively.  The risks, benefits, and alternatives were discussed with the patient. There are risks associated with the surgery including, but not limited to, problems with anesthesia (death), infection, differences in leg length/angulation/rotation, fracture of bones, loosening or failure of implants, malunion, nonunion, hematoma (blood accumulation) which may require surgical drainage, blood clots, pulmonary embolism, nerve injury (foot drop), and blood vessel injury. The patient understands these risks and elects to proceed.    Bertram Savin, MD Cell [phone]    04/18/2019 10:21 AM

## 2019-04-18 NOTE — Progress Notes (Signed)
[facility] PROGRESS NOTE  Jessica [name] AXK:[id number] DOB: 1946-07-06 DOA: 04/17/2019 PCP: Cari Caraway, MD  Assessment/Plan: 1. Acute displaced right distal femur fracture s/p mechanical Fall. Plan for ORIF on 6/25 by Orthopedics. PT to evaluate afterwards for disposition. NPO after midnight. Hold further lovenox after today until postoperative, pain control IV fentanyl for svere break through, norco for moderate 2. Nondisplaced left ankle fracture. Supportive care with brace and non-weight bearing bilaterally per orthopedics 3. T2DM. Holding home insulin pump given planned procedure tomorrow. D/c oral hypogcyemics. Monitor CBGs, SSI 4. Hypertension. At goal continue home losartan and lopressor. Hold home lasix and spirinolactone given upcoming procedure. 5. HLD. Home zocor 6. CHF with preserved EF. euvolemic on exam, monitor volume status, daily weights, BP control.  7. OSA. CPAP nightly  8. Morbid obesity. Nutritionist on board. Ensure supllemntation, prostat, multivitamin, diabetic diet  Code Status: FULL  Family Communication: Spoke with daughter Mrs. Potter at [phone] (indicate person spoken with, relationship, and if by phone, the number) Disposition Plan: plan for OR for ORIF on 6/25   Consultants:  Orthopedics  Procedures:  ORIF pending on 6/25  Antibiotics:  none (indicate start date, and stop date if known)  HPI/Subjective:  Jessica [name] is a 73 y.o. year old female with medical history significant for  anemia, anxiety/depression, osteoarthritis, cataracts, glaucoma, type 2 diabetes mellitus, history of diastolic dysfunction, history of migraine headaches, heart murmur, urolithiasis, hyperlipidemia, hypertension, diabetic peripheral neuropathy, obesity, sleep apnea who presented on 04/17/2019 with unwitnessed mechanical fall in the bathroom and landing on right hip and was found to have acute right sided distal femur fracture ( displaced) and  nondisplaced left ankle fracture.  Right leg still in pain No SOB, no CP  Objective: Vitals:   04/18/19 1111 04/18/19 1450  BP: (!) 141/63   Pulse: 90   Resp: 16   Temp: 99.4 F (37.4 C) 99.4 F (37.4 C)  SpO2: 96%     Intake/Output Summary (Last 24 hours) at 04/18/2019 1846 Last data filed at 04/18/2019 1805 Gross per 24 hour  Intake 837 ml  Output 1450 ml  Net -613 ml   Filed Weights   04/17/19 1408  Weight: 108 kg    Exam:   General:  Obese female, no distress  Cardiovascular: RRR, slight murmur heard, no edema  Respiratory: normal respiratory effort on room air  Abdomen: soft obese abdomen  Musculoskeletal: right leg shortened and external rotated. Bandage on left ankle.    Skin dry, no lesions  Neurologic alert and oriented x 4.   Data Reviewed: Basic Metabolic Panel: Recent Labs  Lab 04/17/19 1437  NA 137  K 4.5  CL 100  CO2 28  GLUCOSE 133*  BUN 21  CREATININE 0.84  CALCIUM 9.4   Liver Function Tests: No results for input(s): AST, ALT, ALKPHOS, BILITOT, PROT, ALBUMIN in the last 168 hours. No results for input(s): LIPASE, AMYLASE in the last 168 hours. No results for input(s): AMMONIA in the last 168 hours. CBC: Recent Labs  Lab 04/17/19 1437  WBC 15.9*  NEUTROABS 13.1*  HGB 13.4  HCT 41.9  MCV 85.9  PLT 202   Cardiac Enzymes: No results for input(s): CKTOTAL, CKMB, CKMBINDEX, TROPONINI in the last 168 hours. BNP (last 3 results) No results for input(s): BNP in the last 8760 hours.  ProBNP (last 3 results) No results for input(s): PROBNP in the last 8760 hours.  CBG: Recent Labs  Lab 04/18/19 0759 04/18/19 1208 04/18/19 1758  GLUCAP 125* 210* 199*    Recent Results (from the past 240 hour(s))  SARS Coronavirus 2 (CEPHEID - Performed in [place] hospital lab), Hosp Order     Status: None   Collection Time: 04/17/19  4:15 PM   Specimen: Nasopharyngeal Swab  Result Value Ref Range Status   SARS Coronavirus 2  NEGATIVE NEGATIVE Final    Comment: (NOTE) If result is NEGATIVE SARS-CoV-2 target nucleic acids are NOT DETECTED. The SARS-CoV-2 RNA is generally detectable in upper and lower  respiratory specimens during the acute phase of infection. The lowest  concentration of SARS-CoV-2 viral copies this assay can detect is 250  copies / mL. A negative result does not preclude SARS-CoV-2 infection  and should not be used as the sole basis for treatment or other  patient management decisions.  A negative result may occur with  improper specimen collection / handling, submission of specimen other  than nasopharyngeal swab, presence of viral mutation(s) within the  areas targeted by this assay, and inadequate number of viral copies  (<250 copies / mL). A negative result must be combined with clinical  observations, patient history, and epidemiological information. If result is POSITIVE SARS-CoV-2 target nucleic acids are DETECTED. The SARS-CoV-2 RNA is generally detectable in upper and lower  respiratory specimens dur ing the acute phase of infection.  Positive  results are indicative of active infection with SARS-CoV-2.  Clinical  correlation with patient history and other diagnostic information is  necessary to determine patient infection status.  Positive results do  not rule out bacterial infection or co-infection with other viruses. If result is PRESUMPTIVE POSTIVE SARS-CoV-2 nucleic acids MAY BE PRESENT.   A presumptive positive result was obtained on the submitted specimen  and confirmed on repeat testing.  While 2019 novel coronavirus  (SARS-CoV-2) nucleic acids may be present in the submitted sample  additional confirmatory testing may be necessary for epidemiological  and / or clinical management purposes  to differentiate between  SARS-CoV-2 and other Sarbecovirus currently known to infect humans.  If clinically indicated additional testing with an alternate test  methodology [phone])  is advised. The SARS-CoV-2 RNA is generally  detectable in upper and lower respiratory sp ecimens during the acute  phase of infection. The expected result is Negative. Fact Sheet for Patients:  StrictlyIdeas.no Fact Sheet for Healthcare Providers: BankingDealers.co.za This test is not yet approved or cleared by the [place] FDA and has been authorized for detection and/or diagnosis of SARS-CoV-2 by FDA under an Emergency Use Authorization (EUA).  This EUA will remain in effect (meaning this test can be used) for the duration of the COVID-19 declaration under Section 564(b)(1) of the Act, 21 U.S.C. section 360bbb-3(b)(1), unless the authorization is terminated or revoked sooner. Performed at [facility], [place] [address] [place], [place] [address]   Surgical PCR screen     Status: None   Collection Time: 04/17/19 10:33 PM   Specimen: Nasal Mucosa; Nasal Swab  Result Value Ref Range Status   MRSA, PCR NEGATIVE NEGATIVE Final   Staphylococcus aureus NEGATIVE NEGATIVE Final    Comment: (NOTE) The Xpert SA Assay (FDA approved for NASAL specimens in patients 56 years of age and older), is one component of a comprehensive surveillance program. It is not intended to diagnose infection nor to guide or monitor treatment. Performed at [facility], [place] [address] [place], [place] [address]      Studies: Dg Ankle Complete Left  Result Date:  04/17/2019 CLINICAL DATA:  72 year old female slipped and fell on wet bathroom today. Pain. EXAM: LEFT ANKLE COMPLETE - 3+ VIEW COMPARISON:  None. FINDINGS: Oblique nondisplaced fracture of the distal left fibula metadiaphysis best demonstrated on image 2. This appears to spare the lateral malleolus where 1 or more small chronic appearing ossific fragments are noted. Generalized soft tissue swelling. Chronic appearing fragment at the medial malleolus. Preserved mortise joint  alignment. Talar dome intact. Possible joint effusion. The calcaneus and visible bones of the left foot appear intact. IMPRESSION: 1. Oblique nondisplaced fracture of the distal left fibula metadiaphysis. 2. Superimposed chronic appearing fracture fragments at both the medial and lateral malleolus. No other acute fracture or dislocation identified about the left ankle. Electronically Signed   By: Genevie Ann M.D.   On: 04/17/2019 15:52   Ct Ankle Left Wo Contrast  Result Date: 04/17/2019 CLINICAL DATA:  Evaluate ankle fracture. EXAM: CT OF THE LEFT ANKLE WITHOUT CONTRAST TECHNIQUE: Multidetector CT imaging of the left ankle was performed according to the standard protocol. Multiplanar CT image reconstructions were also generated. COMPARISON:  Radiographs, same date. FINDINGS: There is a nondisplaced oblique fracture of the distal fibular shaft at and above the level of the ankle mortise. There is also mild lateral mortise widening which may suggest a ligamentous injury. No fracture of the tibia is identified. Remote avulsion type fractures along the distal tip of the medial malleolus. The talus is intact. The subtalar joints are maintained. Mild degenerative changes. No midfoot or hindfoot fractures are identified. Moderate midfoot degenerative changes. Calcaneal spurring changes are noted. IMPRESSION: 1. Nondisplaced fracture involving the distal fibular shaft. 2. No fracture of the tibia or talus. 3. Mild lateral ankle mortise widening may suggest a ligamentous injury. Electronically Signed   By: Marijo Sanes M.D.   On: 04/17/2019 18:48   Chest Portable 1 View  Result Date: 04/17/2019 CLINICAL DATA:  Hypertension EXAM: PORTABLE CHEST 1 VIEW COMPARISON:  None. FINDINGS: The heart size is mildly enlarged. There is no acute osseous abnormality. No pneumothorax. No large pleural effusion. No significant area of consolidation. IMPRESSION: No active disease. Electronically Signed   By: Constance Holster M.D.   On:  04/17/2019 19:37   Dg Hip Unilat  With Pelvis 2-3 Views Right  Result Date: 04/17/2019 CLINICAL DATA:  73 year old female slipped and fell on wet bathroom today. Pain. EXAM: DG HIP (WITH OR WITHOUT PELVIS) 2-3V RIGHT COMPARISON:  Right hip series 12/19/2017. FINDINGS: Femoral heads remain normally located. The pelvis appears stable and intact. Grossly intact proximal left femur. The proximal right femur appears stable and intact. Chronic pelvic phleboliths. Negative visible bowel gas pattern. IMPRESSION: No acute fracture or dislocation identified about the right hip or pelvis. Electronically Signed   By: Genevie Ann M.D.   On: 04/17/2019 15:50   Dg Femur Min 2 Views Right  Result Date: 04/17/2019 CLINICAL DATA:  Fall. EXAM: RIGHT FEMUR 2 VIEWS COMPARISON:  12/19/2017 FINDINGS: Right knee replacement. Spiral fracture distal femur above the knee prosthesis. Displacement and angulation. No other femur fracture.  Right hip intact. IMPRESSION: Spiral fracture distal femur with displacement and angulation. Right knee replacement. Electronically Signed   By: Franchot Gallo M.D.   On: 04/17/2019 16:56    Scheduled Meds: . canagliflozin  300 mg Oral Daily  . cholecalciferol  2,000 Units Oral Daily  . dorzolamide-timolol  1 drop Both Eyes BID  . feeding supplement (PRO-STAT SUGAR FREE 64)  30 mL Oral Daily  . insulin  aspart  0-9 Units Subcutaneous TID WC  . latanoprost  1 drop Both Eyes QHS  . losartan  100 mg Oral Daily  . metFORMIN  500 mg Oral QHS  . metoprolol tartrate  100 mg Oral BID  . multivitamin with minerals  1 tablet Oral Daily  . Ensure Max Protein  11 oz Oral Daily  . simvastatin  40 mg Oral QPM  . spironolactone  25 mg Oral Daily   Continuous Infusions:  Principal Problem:   Closed right femoral fracture ([facility]) Active Problems:   Morbid obesity ([place])   Type 2 diabetes mellitus ([place])   Pure hypercholesterolemia   Essential hypertension, benign   Sleep apnea    Glaucoma      Desiree Hane  [facility]

## 2019-04-19 ENCOUNTER — Inpatient Hospital Stay (HOSPITAL_COMMUNITY): Payer: Medicare Other | Admitting: Anesthesiology

## 2019-04-19 ENCOUNTER — Encounter (HOSPITAL_COMMUNITY)
Admission: EM | Disposition: A | Discharge status: Other Acute Inpatient Hospital | Payer: Self-pay | Source: Home / Self Care | Attending: Internal Medicine

## 2019-04-19 ENCOUNTER — Inpatient Hospital Stay (HOSPITAL_COMMUNITY): Payer: Medicare Other

## 2019-04-19 ENCOUNTER — Encounter (HOSPITAL_COMMUNITY): Payer: Self-pay | Admitting: Certified Registered Nurse Anesthetist

## 2019-04-19 DIAGNOSIS — S72451A Displaced supracondylar fracture without intracondylar extension of lower end of right femur, initial encounter for closed fracture: Secondary | ICD-10-CM | POA: Diagnosis present

## 2019-04-19 DIAGNOSIS — M9711XA Periprosthetic fracture around internal prosthetic right knee joint, initial encounter: Secondary | ICD-10-CM | POA: Diagnosis not present

## 2019-04-19 DIAGNOSIS — G8918 Other acute postprocedural pain: Secondary | ICD-10-CM | POA: Diagnosis not present

## 2019-04-19 DIAGNOSIS — S8262XA Displaced fracture of lateral malleolus of left fibula, initial encounter for closed fracture: Secondary | ICD-10-CM | POA: Diagnosis not present

## 2019-04-19 DIAGNOSIS — I10 Essential (primary) hypertension: Secondary | ICD-10-CM | POA: Diagnosis not present

## 2019-04-19 HISTORY — PX: ORIF FEMUR FRACTURE: SHX2119

## 2019-04-19 LAB — GLUCOSE, CAPILLARY
Glucose-Capillary: 219 mg/dL — ABNORMAL HIGH (ref 70–99)
Glucose-Capillary: 212 mg/dL — ABNORMAL HIGH (ref 70–99)
Glucose-Capillary: 223 mg/dL — ABNORMAL HIGH (ref 70–99)

## 2019-04-19 LAB — CBC
HCT: 35.2 % — ABNORMAL LOW (ref 36.0–46.0)
Hemoglobin: 11.2 g/dL — ABNORMAL LOW (ref 12.0–15.0)
MCH: 28.2 pg (ref 26.0–34.0)
MCHC: 31.8 g/dL (ref 30.0–36.0)
MCV: 88.7 fL (ref 80.0–100.0)
Platelets: 172 10*3/uL (ref 150–400)
RBC: 3.97 MIL/uL (ref 3.87–5.11)
RDW: 13.8 % (ref 11.5–15.5)
WBC: 15 10*3/uL — ABNORMAL HIGH (ref 4.0–10.5)
nRBC: 0 % (ref 0.0–0.2)

## 2019-04-19 LAB — CREATININE, SERUM
Creatinine, Ser: 0.79 mg/dL (ref 0.44–1.00)
GFR calc Af Amer: >60 mL/min (ref >60)
GFR calc non Af Amer: >60 mL/min (ref >60)

## 2019-04-19 SURGERY — OPEN REDUCTION INTERNAL FIXATION (ORIF) DISTAL FEMUR FRACTURE
Anesthesia: General | Laterality: Right

## 2019-04-19 MED ORDER — DOCUSATE SODIUM 100 MG PO CAPS
100.0000 mg | ORAL_CAPSULE | Freq: Two times a day (BID) | ORAL | Status: DC
  Administered 2019-04-19 – 2019-04-30 (×18): 100 mg via ORAL
  Filled 2019-04-19 (×20): qty 1

## 2019-04-19 MED ORDER — CHLORHEXIDINE GLUCONATE 4 % EX LIQD
60.0000 mL | Freq: Once | CUTANEOUS | Status: DC
  Filled 2019-04-19: qty 60

## 2019-04-19 MED ORDER — HYDROMORPHONE HCL 2 MG PO TABS
1.0000 mg | ORAL_TABLET | ORAL | Status: DC | PRN
  Administered 2019-04-19 – 2019-04-23 (×6): 1 mg via ORAL
  Filled 2019-04-19 (×6): qty 1

## 2019-04-19 MED ORDER — ACETAMINOPHEN 325 MG PO TABS
325.0000 mg | ORAL_TABLET | Freq: Four times a day (QID) | ORAL | Status: DC | PRN
  Administered 2019-04-24: 325 mg via ORAL
  Administered 2019-04-25 – 2019-04-30 (×7): 650 mg via ORAL
  Filled 2019-04-19 (×4): qty 2
  Filled 2019-04-19: qty 1
  Filled 2019-04-19: qty 2
  Filled 2019-04-19: qty 1
  Filled 2019-04-19 (×2): qty 2

## 2019-04-19 MED ORDER — PROPOFOL 10 MG/ML IV BOLUS
INTRAVENOUS | Status: AC
  Filled 2019-04-19: qty 20

## 2019-04-19 MED ORDER — MENTHOL 3 MG MT LOZG
1.0000 | LOZENGE | OROMUCOSAL | Status: DC | PRN
  Filled 2019-04-19: qty 9

## 2019-04-19 MED ORDER — FENTANYL CITRATE (PF) 250 MCG/5ML IJ SOLN
INTRAMUSCULAR | Status: AC
  Filled 2019-04-19: qty 5

## 2019-04-19 MED ORDER — FENTANYL CITRATE (PF) 100 MCG/2ML IJ SOLN
INTRAMUSCULAR | Status: DC | PRN
  Administered 2019-04-19 (×2): 50 ug via INTRAVENOUS
  Administered 2019-04-19: 25 ug via INTRAVENOUS
  Administered 2019-04-19: 100 ug via INTRAVENOUS
  Administered 2019-04-19: 25 ug via INTRAVENOUS

## 2019-04-19 MED ORDER — CEFAZOLIN SODIUM-DEXTROSE 2-4 GM/100ML-% IV SOLN
2.0000 g | Freq: Four times a day (QID) | INTRAVENOUS | Status: AC
  Administered 2019-04-19 (×2): 2 g via INTRAVENOUS
  Filled 2019-04-19 (×2): qty 100

## 2019-04-19 MED ORDER — ONDANSETRON HCL 4 MG PO TABS: 4.0000 mg | ORAL_TABLET | Freq: Four times a day (QID) | ORAL | Status: DC | PRN

## 2019-04-19 MED ORDER — CEFAZOLIN SODIUM-DEXTROSE 2-4 GM/100ML-% IV SOLN
2.0000 g | INTRAVENOUS | Status: AC
  Administered 2019-04-19: 2 g via INTRAVENOUS
  Filled 2019-04-19: qty 100

## 2019-04-19 MED ORDER — ONDANSETRON HCL 4 MG/2ML IJ SOLN
INTRAMUSCULAR | Status: AC
  Filled 2019-04-19: qty 2

## 2019-04-19 MED ORDER — PHENYLEPHRINE 40 MCG/ML (10ML) SYRINGE FOR IV PUSH (FOR BLOOD PRESSURE SUPPORT)
PREFILLED_SYRINGE | INTRAVENOUS | Status: DC | PRN
  Administered 2019-04-19 (×8): 80 ug via INTRAVENOUS
  Administered 2019-04-19: 120 ug via INTRAVENOUS
  Administered 2019-04-19 (×2): 80 ug via INTRAVENOUS

## 2019-04-19 MED ORDER — SODIUM CHLORIDE 0.9 % IR SOLN
Status: DC | PRN
  Administered 2019-04-19: 3000 mL

## 2019-04-19 MED ORDER — SUGAMMADEX SODIUM 200 MG/2ML IV SOLN
INTRAVENOUS | Status: AC
  Filled 2019-04-19: qty 2

## 2019-04-19 MED ORDER — LIDOCAINE 2% (20 MG/ML) 5 ML SYRINGE
INTRAMUSCULAR | Status: DC | PRN
  Administered 2019-04-19: 80 mg via INTRAVENOUS

## 2019-04-19 MED ORDER — ACETAMINOPHEN 500 MG PO TABS: 1000.0000 mg | ORAL_TABLET | Freq: Once | ORAL | Status: DC

## 2019-04-19 MED ORDER — BUPIVACAINE-EPINEPHRINE (PF) 0.5% -1:200000 IJ SOLN
INTRAMUSCULAR | Status: DC | PRN
  Administered 2019-04-19: 30 mL via PERINEURAL

## 2019-04-19 MED ORDER — FENTANYL CITRATE (PF) 100 MCG/2ML IJ SOLN: 25.0000 ug | INTRAMUSCULAR | Status: DC | PRN

## 2019-04-19 MED ORDER — DEXAMETHASONE SODIUM PHOSPHATE 10 MG/ML IJ SOLN
INTRAMUSCULAR | Status: AC
  Filled 2019-04-19: qty 1

## 2019-04-19 MED ORDER — PHENYLEPHRINE 40 MCG/ML (10ML) SYRINGE FOR IV PUSH (FOR BLOOD PRESSURE SUPPORT)
PREFILLED_SYRINGE | INTRAVENOUS | Status: AC
  Filled 2019-04-19: qty 10

## 2019-04-19 MED ORDER — HYDROMORPHONE HCL 1 MG/ML IJ SOLN
0.5000 mg | INTRAMUSCULAR | Status: DC | PRN
  Administered 2019-04-23: 0.5 mg via INTRAVENOUS
  Filled 2019-04-19: qty 1

## 2019-04-19 MED ORDER — 0.9 % SODIUM CHLORIDE (POUR BTL) OPTIME
TOPICAL | Status: DC | PRN
  Administered 2019-04-19: 1000 mL

## 2019-04-19 MED ORDER — ROCURONIUM BROMIDE 10 MG/ML (PF) SYRINGE
PREFILLED_SYRINGE | INTRAVENOUS | Status: DC | PRN
  Administered 2019-04-19: 20 mg via INTRAVENOUS
  Administered 2019-04-19: 60 mg via INTRAVENOUS

## 2019-04-19 MED ORDER — TRANEXAMIC ACID-NACL 1000-0.7 MG/100ML-% IV SOLN
1000.0000 mg | INTRAVENOUS | Status: AC
  Administered 2019-04-19: 1000 mg via INTRAVENOUS
  Filled 2019-04-19: qty 100

## 2019-04-19 MED ORDER — METOCLOPRAMIDE HCL 5 MG/ML IJ SOLN: 5.0000 mg | Freq: Three times a day (TID) | INTRAMUSCULAR | Status: DC | PRN

## 2019-04-19 MED ORDER — ENSURE PRE-SURGERY PO LIQD
296.0000 mL | Freq: Once | ORAL | Status: DC
  Filled 2019-04-19: qty 296

## 2019-04-19 MED ORDER — PROPOFOL 10 MG/ML IV BOLUS
INTRAVENOUS | Status: DC | PRN
  Administered 2019-04-19: 200 mg via INTRAVENOUS

## 2019-04-19 MED ORDER — PROMETHAZINE HCL 25 MG/ML IJ SOLN: 6.2500 mg | INTRAMUSCULAR | Status: DC | PRN

## 2019-04-19 MED ORDER — DIPHENHYDRAMINE HCL 50 MG/ML IJ SOLN
INTRAMUSCULAR | Status: AC
  Filled 2019-04-19: qty 1

## 2019-04-19 MED ORDER — PHENOL 1.4 % MT LIQD: 1.0000 | OROMUCOSAL | Status: DC | PRN

## 2019-04-19 MED ORDER — DIPHENHYDRAMINE HCL 50 MG/ML IJ SOLN
INTRAMUSCULAR | Status: DC | PRN
  Administered 2019-04-19: 12.5 mg via INTRAVENOUS

## 2019-04-19 MED ORDER — METOCLOPRAMIDE HCL 5 MG PO TABS: 5.0000 mg | ORAL_TABLET | Freq: Three times a day (TID) | ORAL | Status: DC | PRN

## 2019-04-19 MED ORDER — ONDANSETRON HCL 4 MG/2ML IJ SOLN: 4.0000 mg | Freq: Four times a day (QID) | INTRAMUSCULAR | Status: DC | PRN

## 2019-04-19 MED ORDER — SENNA 8.6 MG PO TABS
1.0000 | ORAL_TABLET | Freq: Two times a day (BID) | ORAL | Status: DC
  Administered 2019-04-19 – 2019-04-28 (×17): 8.6 mg via ORAL
  Filled 2019-04-19 (×20): qty 1

## 2019-04-19 MED ORDER — SUGAMMADEX SODIUM 200 MG/2ML IV SOLN
INTRAVENOUS | Status: DC | PRN
  Administered 2019-04-19: 300 mg via INTRAVENOUS

## 2019-04-19 MED ORDER — LIDOCAINE 2% (20 MG/ML) 5 ML SYRINGE
INTRAMUSCULAR | Status: AC
  Filled 2019-04-19: qty 5

## 2019-04-19 MED ORDER — ONDANSETRON HCL 4 MG/2ML IJ SOLN
INTRAMUSCULAR | Status: DC | PRN
  Administered 2019-04-19: 4 mg via INTRAVENOUS

## 2019-04-19 MED ORDER — MIDAZOLAM HCL 2 MG/2ML IJ SOLN
INTRAMUSCULAR | Status: AC
  Administered 2019-04-19: 2 mg
  Filled 2019-04-19: qty 2

## 2019-04-19 MED ORDER — SCOPOLAMINE 1 MG/3DAYS TD PT72
MEDICATED_PATCH | TRANSDERMAL | Status: DC | PRN
  Administered 2019-04-19: 1 via TRANSDERMAL

## 2019-04-19 MED ORDER — ROCURONIUM BROMIDE 10 MG/ML (PF) SYRINGE
PREFILLED_SYRINGE | INTRAVENOUS | Status: AC
  Filled 2019-04-19: qty 10

## 2019-04-19 MED ORDER — ENOXAPARIN SODIUM 40 MG/0.4ML ~~LOC~~ SOLN
40.0000 mg | SUBCUTANEOUS | Status: DC
  Administered 2019-04-20 – 2019-04-30 (×11): 40 mg via SUBCUTANEOUS
  Filled 2019-04-19 (×11): qty 0.4

## 2019-04-19 MED ORDER — LACTATED RINGERS IV SOLN
INTRAVENOUS | Status: DC | PRN
  Administered 2019-04-19 (×3): via INTRAVENOUS

## 2019-04-19 MED ORDER — DEXAMETHASONE SODIUM PHOSPHATE 10 MG/ML IJ SOLN
INTRAMUSCULAR | Status: DC | PRN
  Administered 2019-04-19: 10 mg via INTRAVENOUS

## 2019-04-19 SURGICAL SUPPLY — 86 items
000610949935 ×4 IMPLANT
500ML CANISTER WITH GEL FOR INFOV.A.C AND V.A.ULTA THERAOPY SYASTEM IMPLANT
APL PRP STRL LF DISP 70% ISPRP (MISCELLANEOUS) ×1
BAG SPEC THK2 15X12 ZIP CLS (MISCELLANEOUS) ×1
BAG ZIPLOCK 12X15 (MISCELLANEOUS) ×3 IMPLANT
BIT DRILL 4.3 (BIT) ×2
BIT DRILL 4.3MM (BIT) ×1
BIT DRILL 4.3X300MM (BIT) IMPLANT
BIT DRILL LONG 3.3 (BIT) ×1 IMPLANT
BIT DRILL LONG 3.3MM (BIT) ×1
BIT DRILL QC 3.3X195 (BIT) ×2 IMPLANT
BNDG GAUZE ELAST 4 BULKY (GAUZE/BANDAGES/DRESSINGS) ×3 IMPLANT
CAP LOCK NCB (Cap) ×10 IMPLANT
CHLORAPREP W/TINT 26 (MISCELLANEOUS) ×3 IMPLANT
CLOSURE WOUND 1/2 X4 (GAUZE/BANDAGES/DRESSINGS) ×1
COVER SURGICAL LIGHT HANDLE (MISCELLANEOUS) ×3 IMPLANT
COVER WAND RF STERILE (DRAPES) IMPLANT
DRAPE C-ARM 42X120 X-RAY (DRAPES) ×3 IMPLANT
DRAPE C-ARMOR (DRAPES) ×3 IMPLANT
DRAPE ORTHO SPLIT 77X108 STRL (DRAPES) ×6
DRAPE POUCH INSTRU U-SHP 10X18 (DRAPES) ×3 IMPLANT
DRAPE SHEET LG 3/4 BI-LAMINATE (DRAPES) ×9 IMPLANT
DRAPE SURG 17X11 SM STRL (DRAPES) ×2 IMPLANT
DRAPE SURG ORHT 6 SPLT 77X108 (DRAPES) ×2 IMPLANT
DRESSING PREVENA PLUS CUSTOM (GAUZE/BANDAGES/DRESSINGS) IMPLANT
DRSG EMULSION OIL 3X16 NADH (GAUZE/BANDAGES/DRESSINGS) ×3 IMPLANT
DRSG PREVENA PLUS CUSTOM (GAUZE/BANDAGES/DRESSINGS) ×3
DRSG VAC ATS LRG SENSATRAC (GAUZE/BANDAGES/DRESSINGS) ×2 IMPLANT
ELECT BLADE TIP CTD 4 INCH (ELECTRODE) ×2 IMPLANT
ELECT REM PT RETURN 15FT ADLT (MISCELLANEOUS) ×3 IMPLANT
FACESHIELD WRAPAROUND (MASK) ×9 IMPLANT
FACESHIELD WRAPAROUND OR TEAM (MASK) IMPLANT
GAUZE SPONGE 4X4 12PLY STRL (GAUZE/BANDAGES/DRESSINGS) ×3 IMPLANT
GLOVE BIO SURGEON STRL SZ8.5 (GLOVE) ×6 IMPLANT
GLOVE BIOGEL M 7.0 STRL (GLOVE) IMPLANT
GLOVE BIOGEL PI IND STRL 7.5 (GLOVE) IMPLANT
GLOVE BIOGEL PI IND STRL 8.5 (GLOVE) ×1 IMPLANT
GLOVE BIOGEL PI INDICATOR 7.5 (GLOVE)
GLOVE BIOGEL PI INDICATOR 8.5 (GLOVE) ×2
GOWN SPEC L3 XXLG W/TWL (GOWN DISPOSABLE) ×6 IMPLANT
GOWN STRL REUS W/TWL LRG LVL3 (GOWN DISPOSABLE) IMPLANT
HANDPIECE INTERPULSE COAX TIP (DISPOSABLE) ×3
JET LAVAGE IRRISEPT WOUND (IRRIGATION / IRRIGATOR) ×3
K-WIRE 2.0 (WIRE) ×3
K-WIRE FXSTD 280X2XNS SS (WIRE) ×1
KIT BASIN OR (CUSTOM PROCEDURE TRAY) ×3 IMPLANT
KIT DRSG PREVENA PLUS 7DAY 125 (MISCELLANEOUS) ×2 IMPLANT
KIT TURNOVER KIT A (KITS) IMPLANT
KWIRE FXSTD 280X2XNS SS (WIRE) IMPLANT
LAVAGE JET IRRISEPT WOUND (IRRIGATION / IRRIGATOR) ×1 IMPLANT
MANIFOLD NEPTUNE II (INSTRUMENTS) ×3 IMPLANT
NS IRRIG 1000ML POUR BTL (IV SOLUTION) ×3 IMPLANT
PACK TOTAL JOINT (CUSTOM PROCEDURE TRAY) ×3 IMPLANT
PLATE FEM DIST NCB PP 278MM (Plate) ×2 IMPLANT
PROTECTOR NERVE ULNAR (MISCELLANEOUS) ×3 IMPLANT
SCREW 5.0 32MM (Screw) ×2 IMPLANT
SCREW 5.0 60MM (Screw) ×2 IMPLANT
SCREW 5.0 80MM (Screw) ×2 IMPLANT
SCREW NCB 3.5X75X5X6.2XST (Screw) IMPLANT
SCREW NCB 4.0MX34M (Screw) ×2 IMPLANT
SCREW NCB 5.0X34MM (Screw) ×4 IMPLANT
SCREW NCB 5.0X46MM (Screw) ×2 IMPLANT
SCREW NCB 5.0X55MM (Screw) ×2 IMPLANT
SCREW NCB 5.0X75MM (Screw) ×3 IMPLANT
SET HNDPC FAN SPRY TIP SCT (DISPOSABLE) IMPLANT
STAPLER VISISTAT 35W (STAPLE) ×3 IMPLANT
STRIP CLOSURE SKIN 1/2X4 (GAUZE/BANDAGES/DRESSINGS) ×2 IMPLANT
STRYKER INTERPULSE HANDPIECE SET IMPLANT
SUCTION FRAZIER HANDLE 12FR (TUBING) ×2
SUCTION TUBE FRAZIER 12FR DISP (TUBING) IMPLANT
SUT MNCRL AB 3-0 PS2 18 (SUTURE) ×3 IMPLANT
SUT MNCRL AB 4-0 PS2 18 (SUTURE) ×3 IMPLANT
SUT MON AB 2-0 CT1 36 (SUTURE) ×2 IMPLANT
SUT MON AB 2-0 SH 27 (SUTURE) ×6
SUT MON AB 2-0 SH27 (SUTURE) IMPLANT
SUT STRATAFIX PDO 1 14 VIOLET (SUTURE) ×3
SUT STRATFX PDO 1 14 VIOLET (SUTURE) ×1
SUT VIC AB 1 CT1 36 (SUTURE) ×6 IMPLANT
SUT VIC AB 2-0 CT1 27 (SUTURE) ×6
SUT VIC AB 2-0 CT1 TAPERPNT 27 (SUTURE) ×2 IMPLANT
SUTURE STRATFX PDO 1 14 VIOLET (SUTURE) ×1 IMPLANT
TOWEL OR 17X26 10 PK STRL BLUE (TOWEL DISPOSABLE) ×8 IMPLANT
TRAY FOLEY MTR SLVR 14FR STAT (SET/KITS/TRAYS/PACK) ×2 IMPLANT
TUBING CONNECTING 10 (TUBING) ×2 IMPLANT
TUBING CONNECTING 10' (TUBING) ×2
WATER STERILE IRR 1000ML POUR (IV SOLUTION) ×3 IMPLANT

## 2019-04-19 NOTE — Interval H&P Note (Signed)
History and Physical Interval Note:  04/19/2019 10:13 AM  Jessica [name]  has presented today for surgery, with the diagnosis of RIGHT PERIPROSTETIC  DISTAL FEMUR FRACTURE.  The various methods of treatment have been discussed with the patient and family. After consideration of risks, benefits and other options for treatment, the patient has consented to  Procedure(s): OPEN REDUCTION INTERNAL FIXATION (ORIF) DISTAL FEMUR FRACTURE (Right) as a surgical intervention.  The patient's history has been reviewed, patient examined, no change in status, stable for surgery.  I have reviewed the patient's chart and labs.  Questions were answered to the patient's satisfaction.    The risks, benefits, and alternatives were discussed with the patient. There are risks associated with the surgery including, but not limited to, problems with anesthesia (death), infection, differences in leg length/angulation/rotation, fracture of bones, loosening or failure of implants, malunion, nonunion, hematoma (blood accumulation) which may require surgical drainage, blood clots, pulmonary embolism, nerve injury (foot drop), and blood vessel injury. The patient understands these risks and elects to proceed.   Jessica [name] Jessica [name]

## 2019-04-19 NOTE — Progress Notes (Signed)
[facility] PROGRESS NOTE  Jessica [name] PXT:[id number] DOB: Jun 29, 1946 DOA: 04/17/2019 PCP: Cari Caraway, MD  Assessment/Plan: 1. Acute displaced right distal femur fracture s/p mechanical Fall. s/p ORIF on 6/25 by Orthopedics. PT to evaluate afterwards for disposition. Non weight bearing, lovenox for prophylaxis start 6/26, f/u for removal of VAC 7 das s/p discarge, pain control IV fentanyl for svere break through, norco for moderate 2. Nondisplaced left ankle fracture. Supportive care with brace and non-weight bearing bilaterally per orthopedics 3. T2DM. Holding home insulin pump using SSI scale here. Monitor CBGs,  4. Hypertension. At goal continue home losartan and lopressor. Held home lasix and spirinolactone prior to  procedure. 5. HLD. Home zocor 6. CHF with preserved EF. euvolemic on exam, monitor volume status, daily weights, BP control.  7. OSA. CPAP nightly  8. Morbid obesity. Nutritionist on board. Ensure supllemntation, prostat, multivitamin, diabetic diet  Code Status: FULL  Family Communication: Spoke with daughter Mrs. Potter at [phone] on 6/24 (indicate person spoken with, relationship, and if by phone, the number) Disposition Plan: s/p ORIF on 6/25, pT eval, pain control   Consultants:  Orthopedics  Procedures:  ORIF  6/25  Antibiotics:  none (indicate start date, and stop date if known)  HPI/Subjective:  Jessica [name] is a 73 y.o. year old female with medical history significant for  anemia, anxiety/depression, osteoarthritis, cataracts, glaucoma, type 2 diabetes mellitus, history of diastolic dysfunction, history of migraine headaches, heart murmur, urolithiasis, hyperlipidemia, hypertension, diabetic peripheral neuropathy, obesity, sleep apnea who presented on 04/17/2019 with unwitnessed mechanical fall in the bathroom and landing on right hip and was found to have acute right sided distal femur fracture ( displaced) and nondisplaced left  ankle fracture.  Pain controlled this morning prior to procedure  Objective: Vitals:   04/19/19 1631 04/19/19 2005  BP: (!) 123/50 (!) 126/47  Pulse: 88 94  Resp: 16 (!) 22  Temp: 98.4 F (36.9 C) 99.5 F (37.5 C)  SpO2: 100% 93%    Intake/Output Summary (Last 24 hours) at 04/19/2019 2005 Last data filed at 04/19/2019 1610 Gross per 24 hour  Intake 2860 ml  Output 2450 ml  Net 410 ml   Filed Weights   04/17/19 1408 04/19/19 0647  Weight: 108 kg 108.4 kg    Exam:   General:  Obese female, no distress  Cardiovascular: RRR, slight murmur heard, no edema  Respiratory: normal respiratory effort on room air  Abdomen: soft obese abdomen  Musculoskeletal: right leg shortened and external rotated. Bandage on left ankle.    Skin dry, no lesions  Neurologic alert and oriented x 4.   Data Reviewed: Basic Metabolic Panel: Recent Labs  Lab 04/17/19 1437 04/19/19 1750  NA 137  --   K 4.5  --   CL 100  --   CO2 28  --   GLUCOSE 133*  --   BUN 21  --   CREATININE 0.84 0.79  CALCIUM 9.4  --    Liver Function Tests: No results for input(s): AST, ALT, ALKPHOS, BILITOT, PROT, ALBUMIN in the last 168 hours. No results for input(s): LIPASE, AMYLASE in the last 168 hours. No results for input(s): AMMONIA in the last 168 hours. CBC: Recent Labs  Lab 04/17/19 1437 04/19/19 1750  WBC 15.9* 15.0*  NEUTROABS 13.1*  --   HGB 13.4 11.2*  HCT 41.9 35.2*  MCV 85.9 88.7  PLT 202 172   Cardiac Enzymes: No results for input(s): CKTOTAL, CKMB, CKMBINDEX, TROPONINI in the last  168 hours. BNP (last 3 results) No results for input(s): BNP in the last 8760 hours.  ProBNP (last 3 results) No results for input(s): PROBNP in the last 8760 hours.  CBG: Recent Labs  Lab 04/18/19 1758 04/18/19 2041 04/19/19 0857 04/19/19 1555 04/19/19 1633  GLUCAP 199* 180* 219* 212* 223*    Recent Results (from the past 240 hour(s))  SARS Coronavirus 2 (CEPHEID - Performed in [facility] hospital lab), Hosp Order     Status: None   Collection Time: 04/17/19  4:15 PM   Specimen: Nasopharyngeal Swab  Result Value Ref Range Status   SARS Coronavirus 2 NEGATIVE NEGATIVE Final    Comment: (NOTE) If result is NEGATIVE SARS-CoV-2 target nucleic acids are NOT DETECTED. The SARS-CoV-2 RNA is generally detectable in upper and lower  respiratory specimens during the acute phase of infection. The lowest  concentration of SARS-CoV-2 viral copies this assay can detect is 250  copies / mL. A negative result does not preclude SARS-CoV-2 infection  and should not be used as the sole basis for treatment or other  patient management decisions.  A negative result may occur with  improper specimen collection / handling, submission of specimen other  than nasopharyngeal swab, presence of viral mutation(s) within the  areas targeted by this assay, and inadequate number of viral copies  (<250 copies / mL). A negative result must be combined with clinical  observations, patient history, and epidemiological information. If result is POSITIVE SARS-CoV-2 target nucleic acids are DETECTED. The SARS-CoV-2 RNA is generally detectable in upper and lower  respiratory specimens dur ing the acute phase of infection.  Positive  results are indicative of active infection with SARS-CoV-2.  Clinical  correlation with patient history and other diagnostic information is  necessary to determine patient infection status.  Positive results do  not rule out bacterial infection or co-infection with other viruses. If result is PRESUMPTIVE POSTIVE SARS-CoV-2 nucleic acids MAY BE PRESENT.   A presumptive positive result was obtained on the submitted specimen  and confirmed on repeat testing.  While 2019 novel coronavirus  (SARS-CoV-2) nucleic acids may be present in the submitted sample  additional confirmatory testing may be necessary for epidemiological  and / or clinical management purposes  to  differentiate between  SARS-CoV-2 and other Sarbecovirus currently known to infect humans.  If clinically indicated additional testing with an alternate test  methodology [phone]) is advised. The SARS-CoV-2 RNA is generally  detectable in upper and lower respiratory sp ecimens during the acute  phase of infection. The expected result is Negative. Fact Sheet for Patients:  StrictlyIdeas.no Fact Sheet for Healthcare Providers: BankingDealers.co.za This test is not yet approved or cleared by the [place] FDA and has been authorized for detection and/or diagnosis of SARS-CoV-2 by FDA under an Emergency Use Authorization (EUA).  This EUA will remain in effect (meaning this test can be used) for the duration of the COVID-19 declaration under Section 564(b)(1) of the Act, 21 U.S.C. section 360bbb-3(b)(1), unless the authorization is terminated or revoked sooner. Performed at [facility], [place] [address], [place], [place] [address]   Surgical PCR screen     Status: None   Collection Time: 04/17/19 10:33 PM   Specimen: Nasal Mucosa; Nasal Swab  Result Value Ref Range Status   MRSA, PCR NEGATIVE NEGATIVE Final   Staphylococcus aureus NEGATIVE NEGATIVE Final    Comment: (NOTE) The Xpert SA Assay (FDA approved for NASAL specimens in patients 22 years of  age and older), is one component of a comprehensive surveillance program. It is not intended to diagnose infection nor to guide or monitor treatment. Performed at [facility], [place] [address] [place], [place] [address]      Studies: Dg C-arm 1-60 Min-no Report  Result Date: 04/19/2019 Fluoroscopy was utilized by the requesting physician.  No radiographic interpretation.   Dg Femur, Min 2 Views Right  Result Date: 04/19/2019 CLINICAL DATA:  Femur fracture EXAM: RIGHT FEMUR 2 VIEWS COMPARISON:  04/17/2019 FINDINGS: Four low resolution intraoperative spot views  of the right femur. Total fluoroscopy time was 1 minutes 25 seconds. Prior right knee replacement. Surgical plate and screw fixation of the femur for comminuted distal femoral fracture. Placement of cerclage wires at the distal femur. Anatomic alignment is noted. IMPRESSION: Intraoperative fluoroscopic assistance provided during internal fixation of distal femoral fracture Electronically Signed   By: Donavan Foil M.D.   On: 04/19/2019 16:07   Dg Femur [place] 2 Views Right  Result Date: 04/19/2019 CLINICAL DATA:  Postop EXAM: RIGHT FEMUR PORTABLE 2 VIEW COMPARISON:  04/17/2019 FINDINGS: Lateral cutaneous staples. Interval surgical plate, screw and cerclage wire fixation of the femur from the proximal shaft to the knee replacement. This bridges acute comminuted distal femoral fracture. Reduction of fracture displacement, now with anatomic alignment. Prior right knee replacement. Gas in the soft tissues consistent with recent surgery. IMPRESSION: Interval internal fixation of comminuted distal femoral fracture, now with anatomic alignment. Expected postsurgical changes Electronically Signed   By: Donavan Foil M.D.   On: 04/19/2019 19:31    Scheduled Meds: . cholecalciferol  2,000 Units Oral Daily  . docusate sodium  100 mg Oral BID  . dorzolamide-timolol  1 drop Both Eyes BID  . [START ON 04/20/2019] enoxaparin (LOVENOX) injection  40 mg Subcutaneous Q24H  . feeding supplement (PRO-STAT SUGAR FREE 64)  30 mL Oral Daily  . insulin aspart  0-9 Units Subcutaneous TID WC  . latanoprost  1 drop Both Eyes QHS  . losartan  100 mg Oral Daily  . metoprolol tartrate  100 mg Oral BID  . multivitamin with minerals  1 tablet Oral Daily  . Ensure Max Protein  11 oz Oral Daily  . senna  1 tablet Oral BID  . simvastatin  40 mg Oral QPM  . spironolactone  25 mg Oral Daily   Continuous Infusions: .  ceFAZolin (ANCEF) IV 2 g (04/19/19 1958)    Principal Problem:   Closed right femoral fracture  (HCC) Active Problems:   Morbid obesity ([place])   Type 2 diabetes mellitus ([place])   Pure hypercholesterolemia   Essential hypertension, benign   Sleep apnea   Glaucoma   Displaced supracondylar fracture of distal end of right femur without intracondylar extension ([employer])      Desiree Hane  [facility]

## 2019-04-19 NOTE — Anesthesia Procedure Notes (Signed)
Procedure Name: Intubation Date/Time: 04/19/2019 11:46 AM Performed by: [place] Ziyana Morikawa C, CRNA Pre-anesthesia Checklist: Patient identified, Emergency Drugs available, Suction available and Patient being monitored Patient Re-evaluated:Patient Re-evaluated prior to induction Oxygen Delivery Method: Circle system utilized Preoxygenation: Pre-oxygenation with 100% oxygen Induction Type: IV induction Ventilation: Mask ventilation without difficulty Laryngoscope Size: Mac and 3 Grade View: Grade III Tube type: Oral Tube size: 7.5 mm Number of attempts: 1 Airway Equipment and Method: Stylet and Oral airway Placement Confirmation: ETT inserted through vocal cords under direct vision,  positive ETCO2 and breath sounds checked- equal and bilateral Secured at: 20 cm Tube secured with: Tape Dental Injury: Teeth and Oropharynx as per pre-operative assessment

## 2019-04-19 NOTE — Plan of Care (Signed)

## 2019-04-19 NOTE — Anesthesia Preprocedure Evaluation (Addendum)
Anesthesia Evaluation  Patient identified by MRN, date of birth, ID band Patient awake    Reviewed: Allergy & Precautions, NPO status , Patient's Chart, lab work & pertinent test results  History of Anesthesia Complications Negative for: history of anesthetic complications  Airway Mallampati: III  TM Distance: >3 FB Neck ROM: Full    Dental no notable dental hx. (+) Dental Advisory Given   Pulmonary sleep apnea ,    Pulmonary exam normal        Cardiovascular hypertension, Pt. on medications and Pt. on home beta blockers Normal cardiovascular exam     Neuro/Psych PSYCHIATRIC DISORDERS Anxiety Depression CVA    GI/Hepatic negative GI ROS, Neg liver ROS,   Endo/Other  diabetes  Renal/GU negative Renal ROS     Musculoskeletal negative musculoskeletal ROS (+)   Abdominal   Peds  Hematology negative hematology ROS (+)   Anesthesia Other Findings Day of surgery medications reviewed with the patient.  Reproductive/Obstetrics                            Anesthesia Physical Anesthesia Plan  ASA: III  Anesthesia Plan: General   Post-op Pain Management:  Regional for Post-op pain   Induction: Intravenous  PONV Risk Score and Plan: 4 or greater and Ondansetron, Dexamethasone, Diphenhydramine and Scopolamine patch - Pre-op  Airway Management Planned: Oral ETT  Additional Equipment:   Intra-op Plan:   Post-operative Plan: Extubation in OR  Informed Consent: I have reviewed the patients History and Physical, chart, labs and discussed the procedure including the risks, benefits and alternatives for the proposed anesthesia with the patient or authorized representative who has indicated his/her understanding and acceptance.     Dental advisory given  Plan Discussed with: CRNA and Anesthesiologist  Anesthesia Plan Comments:        Anesthesia Quick Evaluation

## 2019-04-19 NOTE — Op Note (Signed)
OPERATIVE REPORT   04/19/2019  3:13 PM  PATIENT:  Jessica [name]   SURGEON:  Bertram Savin, MD  ASSISTANT:  Georgann Housekeeper, RNFA.   PREOPERATIVE DIAGNOSIS: Right periprosthetic distal femur fracture.  Nondisplaced left lateral malleolus ankle fracture.  POSTOPERATIVE DIAGNOSIS:  Same.  PROCEDURE: Open reduction internal fixation right femur. Closed treatment of left lateral malleolus ankle fracture.  ANESTHESIA:   GETA.  ESTIMATED BLOOD LOSS: 600 cc.  ANTIBIOTICS: 3 g Ancef.  IMPLANTS: Zimmer NCB periprosthetic distal femur plate, 12 hole. 5.0 mm distal interlocking screw x5 with locking caps. 5 mm proximal interlocking screw x4.  SPECIMENS: None.  TUBES AND DRAINS: [place] incisional wound VAC.  COMPLICATIONS: None.  DISPOSITION: Stable to PACU.  SURGICAL INDICATIONS:  Jessica [name] is a 73 y.o. female with a history of previous right total knee arthroplasty.  She sustained a ground-level fall and suffered a nondisplaced left lateral malleolus ankle fracture as well as displaced right periprosthetic distal femur fracture.  She was placed into a short leg splint on the left side.  She was admitted to the hospitalist service for perioperative risk ratification and medical optimization.  Risk, benefits, alternatives to surgical fixation of her right femur were explained, and she elected to proceed.  The risks, benefits, and alternatives were discussed with the patient preoperatively including but not limited to the risks of infection, bleeding, nerve / blood vessel injury, malunion, nonunion, cardiopulmonary complications, the need for repeat surgery, among others, and the patient was willing to proceed.  PROCEDURE IN DETAIL: The patient was identified in the holding area using 2 identifiers.  The surgical site was marked by myself.  The anesthesia team placed a femoral nerve block.  The patient was taken to the operating room and placed supine on the  operating room table.  General anesthesia was induced.  A Foley catheter was placed.  She was flipped to the left lateral decubitus position.  Axillary roll was placed.  She was secured to the table with a beanbag.  All bony prominences were well-padded.  Timeout was called, verifying site and site of surgery.  She did receive IV antibiotics within 60 minutes of beginning the procedure.  I began by using fluoroscopy to define her anatomy.  The fracture was located.  I created a longitudinal incision over the lateral aspect of the femur from the proximal level of the fracture down to the articular joint line.  Blunt dissection was performed until I reached the IT band, which I split in line with fibers.  Cobb elevator was used to lift the vastus lateralis muscle anteriorly off the intermuscular septum.  Cobb retractors were placed, thus revealing the fracture.  There was copious fracture hematoma which I irrigated, thus cleaning the fracture site.  There is a spiral supracondylar femur fracture not involving the implants.  The fracture was reduced in a near anatomical fashion with traction and rotation.  Reduction was provisionally held with a bone clamp.  I subperiosteally placed 2 sets of double 18-gauge stainless steel wires that were tightened and clipped.    I then selected a 12 hole distal femur plate.   The plate was attached to the MIS jig.  The plate was slid into place intramuscularly.  The position of the plate was confirmed on AP and lateral fluoroscopy views.  The plate was provisionally held proximally with a drill bit, and distally with a K wire.  Distally, I placed a total of 5 bicortical 5.0 millimeter screws in  the articular block.  Locking caps were placed to create a fixed angle construct.  Proximally, I placed a total of 4 screws through separate stab incisions; the most proximal screw was a 4.0 mm screw, and the remaining screws were 5.0 mm bicortical screws.  Final AP and lateral  fluoroscopy views were obtained to confirm reduction of the fracture and placement of the hardware.  Copious irrigation was performed with Irrisept solution and normal saline using pulse lavage.  The IT band was closed with #1 Vicryl and #1 strata fix suture.  Deep fatty layer was closed with 2-0 Vicryl suture.  Deep dermal layer was closed with 2-0 Monocryl suture.  Skin was reapproximated with staples. Prevena incisional wound VAC was placed according to manufacturer's instructions.  Suction was hooked up to 125 mmHg with excellent seal.  Again, the left ankle fracture was treated in a closed fashion due to anatomic alignment of the fracture.  The patient was then awakened from anesthesia, and taken to the PACU in stable condition.  Sponge, needle, and instrument counts were correct at the end of the case x2.  There were no known complications.   POSTOPERATIVE PLAN: Postoperatively, the patient will be readmitted to the hospitalist service.  Nonweightbearing bilateral lower extremities.  Continue splint left lower extremity.  Physical therapy for mobilization out of bed and range of motion/quad strengthening to the right knee.  Begin Lovenox for DVT prophylaxis beginning tomorrow.  Patient will undergo disposition planning.  She will need to return to the office 7 days after discharge for removal of her incisional VAC.

## 2019-04-19 NOTE — Progress Notes (Signed)
Assisted Dr. Singer with right, ultrasound guided, femoral block. Side rails up, monitors on throughout procedure. See vital signs in flow sheet. Tolerated Procedure well.  

## 2019-04-19 NOTE — Anesthesia Postprocedure Evaluation (Signed)
Anesthesia Post Note  Patient: Jessica [name]  Procedure(s) Performed: OPEN REDUCTION INTERNAL FIXATION (ORIF) DISTAL FEMUR FRACTURE (Right )     Patient location during evaluation: PACU Anesthesia Type: General Level of consciousness: sedated Pain management: pain level controlled Vital Signs Assessment: post-procedure vital signs reviewed and stable Respiratory status: spontaneous breathing and respiratory function stable Cardiovascular status: stable Postop Assessment: no apparent nausea or vomiting Anesthetic complications: no    Last Vitals:  Vitals:   04/19/19 1615 04/19/19 1631  BP: (!) 104/51 (!) 123/50  Pulse: 84 88  Resp: 13 16  Temp:  36.9 C  SpO2: 96% 100%    Last Pain:  Vitals:   04/19/19 1805  TempSrc:   PainSc: 8                  Clarence Dunsmore DANIEL

## 2019-04-19 NOTE — Transfer of Care (Signed)
Immediate Anesthesia Transfer of Care Note  Patient: Jessica [name]  Procedure(s) Performed: OPEN REDUCTION INTERNAL FIXATION (ORIF) DISTAL FEMUR FRACTURE (Right )  Patient Location: PACU  Anesthesia Type:General  Level of Consciousness: awake, alert  and oriented  Airway & Oxygen Therapy: Patient Spontanous Breathing and Patient connected to face mask oxygen  Post-op Assessment: Report given to RN and Post -op Vital signs reviewed and stable  Post vital signs: Reviewed and stable  Last Vitals:  Vitals Value Taken Time  BP    Temp    Pulse    Resp    SpO2      Last Pain:  Vitals:   04/19/19 0928  TempSrc: Oral  PainSc:       Patients Stated Pain Goal: 1 (03/40/35 2481)  Complications: No apparent anesthesia complications

## 2019-04-19 NOTE — Anesthesia Procedure Notes (Signed)
Anesthesia Regional Block: Femoral nerve block   Pre-Anesthetic Checklist: ,, timeout performed, Correct Patient, Correct Site, Correct Laterality, Correct Procedure, Correct Position, site marked, Risks and benefits discussed,  Surgical consent,  Pre-op evaluation,  At surgeon's request and post-op pain management  Laterality: Right  Prep: chloraprep       Needles:  Injection technique: Single-shot  Needle Type: Echogenic Stimulator Needle     Needle Length: 5cm  Needle Gauge: 22     Additional Needles:   Procedures:, nerve stimulator,,, ultrasound used (permanent image in chart),,,,   Nerve Stimulator or Paresthesia:  Response: quadraceps contraction, 0.45 mA,   Additional Responses:   Narrative:  Start time: 04/19/2019 9:43 AM End time: 04/19/2019 9:53 AM Injection made incrementally with aspirations every 5 mL.  Performed by: Personally  Anesthesiologist: Duane Boston, MD  Additional Notes: Functioning IV was confirmed and monitors were applied.  A 88mm 22ga Arrow echogenic stimulator needle was used. Sterile prep and drape,hand hygiene and sterile gloves were used. Ultrasound guidance: relevant anatomy identified, needle position confirmed, local anesthetic spread visualized around nerve(s)., vascular puncture avoided.  Image printed for medical record. Negative aspiration and negative test dose prior to incremental administration of local anesthetic. The patient tolerated the procedure well.

## 2019-04-20 LAB — BASIC METABOLIC PANEL
Anion gap: 11 (ref 5–15)
BUN: 32 mg/dL — ABNORMAL HIGH (ref 8–23)
CO2: 23 mmol/L (ref 22–32)
Calcium: 8.7 mg/dL — ABNORMAL LOW (ref 8.9–10.3)
Chloride: 104 mmol/L (ref 98–111)
Creatinine, Ser: 0.74 mg/dL (ref 0.44–1.00)
GFR calc Af Amer: >60 mL/min (ref >60)
GFR calc non Af Amer: >60 mL/min (ref >60)
Glucose, Bld: 319 mg/dL — ABNORMAL HIGH (ref 70–99)
Potassium: 4.3 mmol/L (ref 3.5–5.1)
Sodium: 138 mmol/L (ref 135–145)

## 2019-04-20 LAB — CBC
HCT: 34.2 % — ABNORMAL LOW (ref 36.0–46.0)
Hemoglobin: 10.6 g/dL — ABNORMAL LOW (ref 12.0–15.0)
MCH: 27.4 pg (ref 26.0–34.0)
MCHC: 31 g/dL (ref 30.0–36.0)
MCV: 88.4 fL (ref 80.0–100.0)
Platelets: 174 10*3/uL (ref 150–400)
RBC: 3.87 MIL/uL (ref 3.87–5.11)
RDW: 13.9 % (ref 11.5–15.5)
WBC: 13.9 10*3/uL — ABNORMAL HIGH (ref 4.0–10.5)
nRBC: 0 % (ref 0.0–0.2)

## 2019-04-20 LAB — GLUCOSE, CAPILLARY
Glucose-Capillary: 289 mg/dL — ABNORMAL HIGH (ref 70–99)
Glucose-Capillary: 222 mg/dL — ABNORMAL HIGH (ref 70–99)
Glucose-Capillary: 280 mg/dL — ABNORMAL HIGH (ref 70–99)
Glucose-Capillary: 205 mg/dL — ABNORMAL HIGH (ref 70–99)
Glucose-Capillary: 173 mg/dL — ABNORMAL HIGH (ref 70–99)

## 2019-04-20 MED ORDER — INSULIN PUMP
Freq: Three times a day (TID) | SUBCUTANEOUS | Status: DC
  Administered 2019-04-20: 5.6 via SUBCUTANEOUS
  Administered 2019-04-21 – 2019-04-22 (×4): via SUBCUTANEOUS
  Administered 2019-04-22: 15.5 via SUBCUTANEOUS
  Administered 2019-04-23: 9.7 via SUBCUTANEOUS
  Administered 2019-04-23: 4.1 via SUBCUTANEOUS
  Administered 2019-04-23: 22:00:00 via SUBCUTANEOUS
  Administered 2019-04-23: 12.2 via SUBCUTANEOUS
  Administered 2019-04-23: 1.6 via SUBCUTANEOUS
  Administered 2019-04-24 – 2019-04-30 (×17): via SUBCUTANEOUS
  Filled 2019-04-20 (×2): qty 1

## 2019-04-20 MED ORDER — HYDROMORPHONE HCL 2 MG PO TABS: 1.0000 mg | ORAL_TABLET | ORAL | 0 refills | Status: DC | PRN

## 2019-04-20 MED ORDER — ENOXAPARIN SODIUM 40 MG/0.4ML ~~LOC~~ SOLN: 40.0000 mg | SUBCUTANEOUS | 0 refills | Status: DC

## 2019-04-20 MED FILL — Insulin Aspart Inj 100 Unit/ML: SUBCUTANEOUS | Qty: 10 | Status: AC

## 2019-04-20 NOTE — Addendum Note (Signed)
Addendum  created 04/20/19 0843 by Sharlette Dense, CRNA   Intraprocedure Meds edited

## 2019-04-20 NOTE — Progress Notes (Addendum)
Patient is having white vaginal discharge with redness. States she "gets yeast infections when on antibiotics". Would like to see if there is something she can take for it. Will notify Md. Also, pt reminded to notify nurse the amount of insulin she boluses herself with AC & HS. Eulas Post, RN

## 2019-04-20 NOTE — Evaluation (Signed)
Physical Therapy Evaluation Patient Details Name: Jessica [name] MRN: [id number] DOB: 18-Dec-1945 Today's Date: 04/20/2019   History of Present Illness  Pt is a 73 year old female admitted for fractures after mechanical fall.  Pt sustained an acute displaced right distal femur fracture and s/p ORIF on 6/25 and Nondisplaced left ankle fracture  Clinical Impression  Pt admitted with above diagnosis. Pt currently with functional limitations due to the deficits listed below (see PT Problem List).  Pt will benefit from skilled PT to increase their independence and safety with mobility to allow discharge to the venue listed below.  Pt is NWB on bilateral LEs.  Pt assisted to sitting EOB and requires UE support while sitting.  Pt reports she lives alone.  Pt agreeable to d/c to SNF.  Pt will likely have difficulty transferring OOB and maintaining bil NWB status.  Recommend lift for OOB with nursing.     Follow Up Recommendations SNF    Equipment Recommendations  None recommended by PT    Recommendations for Other Services       Precautions / Restrictions Precautions Precautions: Fall Precaution Comments: NPWT over R LE incision Restrictions Weight Bearing Restrictions: Yes RLE Weight Bearing: Non weight bearing LLE Weight Bearing: Non weight bearing      Mobility  Bed Mobility Overal bed mobility: Needs Assistance Bed Mobility: Supine to Sit;Sit to Supine     Supine to sit: Max assist;+2 for physical assistance;HOB elevated Sit to supine: +2 for physical assistance;Max assist   General bed mobility comments: pt attempting to assist able, verbal cues for technique, assist for lower body, especially support for R LE, poor ability to scoot today  Transfers                    Ambulation/Gait                Stairs            Wheelchair Mobility    Modified Rankin (Stroke Patients Only)       Balance Overall balance assessment: Needs  assistance;History of Falls(pt fell prior to admission due to wet floor in bathroom) Sitting-balance support: Bilateral upper extremity supported;Feet unsupported Sitting balance-Leahy Scale: Poor Sitting balance - Comments: requires UEs support                                     Pertinent Vitals/Pain Pain Assessment: No/denies pain    Home Living Family/patient expects to be discharged to:: Private residence Living Arrangements: Alone   Type of Home: House Home Access: Level entry     Home Layout: One level Home Equipment: Environmental consultant - 2 wheels;Wheelchair - power Additional Comments: pt reports the power w/c was her fathers (he passed away this 02/23/2023)    Prior Function Level of Independence: Independent with assistive device(s)               Hand Dominance        Extremity/Trunk Assessment        Lower Extremity Assessment Lower Extremity Assessment: RLE deficits/detail;LLE deficits/detail RLE Deficits / Details: required assist for bed mobility, NPWT in place, able to perform ankle pumps and good quad contraction LLE Deficits / Details: able to lift L LE off bed and move toes, ankle in hard splint       Communication   Communication: No difficulties  Cognition Arousal/Alertness: Awake/alert Behavior During Therapy: [facility]  for tasks assessed/performed Overall Cognitive Status: Within Functional Limits for tasks assessed                                        General Comments      Exercises General Exercises - Lower Extremity Ankle Circles/Pumps: Other (comment);Limitations;5 reps;Both;Supine Ankle Circles/Pumps Limitations: wiggles toes for L foot Quad Sets: AROM;Both;5 reps;Supine Gluteal Sets: AROM;Both;5 reps   Assessment/Plan    PT Assessment Patient needs continued PT services  PT Problem List Decreased strength;Decreased mobility;Decreased activity tolerance;Decreased balance;Decreased knowledge of use of  DME;Obesity       PT Treatment Interventions DME instruction;Cognitive remediation;Therapeutic activities;Therapeutic exercise;Functional mobility training;Balance training;Wheelchair mobility training;Patient/family education    PT Goals (Current goals can be found in the Care Plan section)  Acute Rehab PT Goals PT Goal Formulation: With patient Time For Goal Achievement: 05/03/19 Potential to Achieve Goals: Good    Frequency Min 2X/week   Barriers to discharge        Co-evaluation               AM-PAC PT "6 Clicks" Mobility  Outcome Measure Help needed turning from your back to your side while in a flat bed without using bedrails?: Total Help needed moving from lying on your back to sitting on the side of a flat bed without using bedrails?: Total Help needed moving to and from a bed to a chair (including a wheelchair)?: Total Help needed standing up from a chair using your arms (e.g., wheelchair or bedside chair)?: Total Help needed to walk in hospital room?: Total Help needed climbing 3-5 steps with a railing? : Total 6 Click Score: 6    End of Session   Activity Tolerance: Patient tolerated treatment well Patient left: in bed;with bed alarm set;with call bell/phone within reach Nurse Communication: Mobility status;Weight bearing status PT Visit Diagnosis: Other abnormalities of gait and mobility (R26.89)    Time: 0349-1791 PT Time Calculation (min) (ACUTE ONLY): 20 min   Charges:   PT Evaluation $PT Eval Low Complexity: [place], PT, DPT Acute Rehabilitation Services Office: [phone] Pager: [phone]  Trena Platt 04/20/2019, 12:36 PM

## 2019-04-20 NOTE — Care Management Important Message (Signed)
Important Message  Patient Details IM Letter given to [name]  SW to present to the Patient Name: Jessica [name] MRN: [id number] Date of Birth: January 26, 1946   Medicare Important Message Given:  Yes     Kerin Salen 04/20/2019, 12:07 PM

## 2019-04-20 NOTE — Progress Notes (Signed)
Inpatient Diabetes Program Recommendations  AACE/ADA: New Consensus Statement on Inpatient Glycemic Control (2015)  Target Ranges:  Prepandial:   less than 140 mg/dL      Peak postprandial:   less than 180 mg/dL (1-2 hours)      Critically ill patients:  140 - 180 mg/dL   Lab Results  Component Value Date   GLUCAP 280 (H) 04/20/2019   HGBA1C 6.9 (H) 04/18/2019    Review of Glycemic Control  Diabetes history: DM2 Outpatient Diabetes medications: Insulin pump - [employer] Current orders for Inpatient glycemic control: Insulin pump  Endo - Dr. Chalmers Cater HgbA1C - 6.9% - excellent control   7.2% at endo office  Pt needs insulin for pump. Ordered Novolog from pharmacy - pt refilled pump.  Pump settings: Basal - 79.975 Total Bolus - CF - 25   Goal 120 mg/dL   CHO ratio 1:10  Inpatient Diabetes Program Recommendations:     D/C SQ Novolog 0-9 units tidwc per insulin pump policy Contract signed and site was changed on 6/25. Flowsheet at bedside to record boluses. RN to complete insulin pump assessment each shift and record boluses on flowsheet.  Spoke with pt regarding her diabetes control at home. Has Libre for monitoring and blood sugars normally below 140 mg/dL. Sees endo on regular basis. No problems with pump.  Will continue to follow.  Thank you. Jessica [name], RD, LDN, CDE Inpatient Diabetes Coordinator [phone]

## 2019-04-20 NOTE — Progress Notes (Addendum)
PROGRESS NOTE  Jessica [name]:[id number] DOB: 05-26-1946 DOA: 04/17/2019 PCP: Cari Caraway, MD  Brief History   Jessica [name] is a 73 y.o. year old female with medical history significant for anemia, anxiety/depression, osteoarthritis, cataracts, glaucoma, type 2 diabetes mellitus, history of diastolic dysfunction, history of migraine headaches, heart murmur, urolithiasis, hyperlipidemia, hypertension, diabetic peripheral neuropathy, obesity, sleep apnea who presented on 04/17/2019 with unwitnessed mechanical fall in the bathroom and landing on right hip and was found to have acute right sided distal femur fracture ( displaced) and nondisplaced left ankle fracture underwent operative repair with incisional VAC placed for femur fracture on 6/25 and splint/supportive care for ankle fracture.   A & P  1. Acute displaced right distal femur fracture s/p mechanical Fall. s/p ORIF on 6/25 by Orthopedics. PT to evaluate afterwards for disposition. Non weight bearing, lovenox for prophylaxis start 6/26, f/u for removal of VAC 7 days s/p discarge, pain control IV fentanyl for severe break through, norco for PRN moderate 2. Low grade fever of 99.5 overnight. S/p post operative cefazolin. Remains afebrile, monitor. No local signs of infection 3. Nondisplaced left ankle fracture. Supportive care with brace and non-weight bearing bilaterally per orthopedics 4. T2DM, well controlled A1c 6.9%. On home insulin pump but ran out of insulin doing sliding scale, diabetes coordinator consulted. Monitor CBGs-fasting CBGs in 280s needs increase in insulin,  5. Hypertension. At goal continue home losartan and lopressor. Held home lasix and spirinolactone prior to  procedure.  Spironolactone resumed 6. E. coli bacteriuria, asymptomatic UA with moderate leuks, patient denies any urinary symptoms 7. HLD. Home zocor 8. CHF with preserved EF. euvolemic on exam, monitor volume status, daily weights, BP control.  9.  OSA. CPAP nightly  10. Morbid obesity. Nutritionist on board. Ensure supllementation, prostat, multivitamin, diabetic diet        DVT prophylaxis: lovenox Code Status: FULL CODE Family Communication: updated daughter on 6/25 Disposition Plan: PT eval rec SNF   Desiree Hane  [facility] Direct contact: see [email or web address] (further directions at bottom of note if needed) 7PM-7AM contact night coverage as at bottom of note 04/20/2019, 8:20 AM  LOS: 3 days   Consultants  . Orthopedics  Procedures  . ORIF, 6/25  Antibiotics  . Cefazolin 6/26  Interval History/Subjective  Doing well Pain controlled Worried about insulin pump as she ran out of her insulin overnight  Objective   Vitals:  Vitals:   04/19/19 2151 04/20/19 0358  BP:  (!) 143/54  Pulse: (!) 103 85  Resp: 20 16  Temp:  98.5 F (36.9 C)  SpO2: 96% 95%    Exam:  Constitutional:  . Appears calm and comfortable Eyes:  . pupils and irises appear normal . Normal lids and conjunctivae ENMT:  . grossly normal hearing  . Lips appear normal . external ears, nose appear normal . Oropharynx: mucosa, tongue,posterior pharynx appear normal Neck:  . neck appears normal, no masses, normal ROM, supple  Respiratory:  . CTA bilaterally, no w/r/r.  . Respiratory effort normal. No retractions or accessory muscle use Cardiovascular:  . RRR, no m/r/g . No LE extremity edema   . Normal pedal pulses Abdomen:  . Abdomen appears normal; no tenderness or masses . No hernias . No HSM Musculoskeletal:  . Digits/nails BUE: no clubbing, cyanosis, petechiae, infection . Left upper leg with incisional wound vac in place, pulses intact. Right ankle bandaged with splint in place   .  Skin:  . No rashes, lesions, ulcers .  palpation of skin: no induration or nodules Neurologic:  . CN 2-12 intact . Sensation all 4 extremities intact Psychiatric:  . Mental status o Mood, affect appropriate o Orientation to  person, place, time  . judgment and insight appear intact    I have personally reviewed the following:   Today's Data    Lab Data  . WBC 13.9, hemoglobin 10.6, glucose 319  Micro Data  . Urine culture grew 100,000 E. coli  Imaging  . .6/23, pelvis x-ray no fracture . 6/23 left ankle x-ray: Oblique nondisplaced fracture of left fibula, . 6/23 x-ray femur: Spiral fracture of distal femur with displacement and angulation . CT left ankle, successfully treated: Nondisplaced fracture of distal fibular shaft, mild lateral ankle widening suggestive of ligamentous injury . 6/23 chest x-ray: No active disease  Cardiology Data  . EKG, 6/23, no change from baseline  Other Data  .   Scheduled Meds: . cholecalciferol  2,000 Units Oral Daily  . docusate sodium  100 mg Oral BID  . dorzolamide-timolol  1 drop Both Eyes BID  . enoxaparin (LOVENOX) injection  40 mg Subcutaneous Q24H  . feeding supplement (PRO-STAT SUGAR FREE 64)  30 mL Oral Daily  . insulin aspart  0-9 Units Subcutaneous TID WC  . latanoprost  1 drop Both Eyes QHS  . losartan  100 mg Oral Daily  . metoprolol tartrate  100 mg Oral BID  . multivitamin with minerals  1 tablet Oral Daily  . Ensure Max Protein  11 oz Oral Daily  . senna  1 tablet Oral BID  . simvastatin  40 mg Oral QPM  . spironolactone  25 mg Oral Daily   Continuous Infusions:  Principal Problem:   Closed right femoral fracture (HCC) Active Problems:   Morbid obesity ([place])   Type 2 diabetes mellitus ([place])   Pure hypercholesterolemia   Essential hypertension, benign   Sleep apnea   Glaucoma   Displaced supracondylar fracture of distal end of right femur without intracondylar extension ([place])   LOS: 3 days   How to contact the [facility] Attending or Consulting provider 7A - 7P or covering provider during after hours [place], for this patient?  1. Check the care team in [facility] and look for a) attending/consulting TR[facility] provider listed and b) the [facility] team listed  2. Log into [email or web address] and use [place]'s universal password to access. If you do not have the password, please contact the hospital operator. 3. Locate the [facility] provider you are looking for under [facility] and page to a number that you can be directly reached. 4. If you still have difficulty reaching the provider, please page the [facility] (Director on Call) for the Hospitalists listed on amion for assistance.

## 2019-04-20 NOTE — Progress Notes (Addendum)
    Subjective:  Patient reports pain as mild to moderate.  Denies N/V/CP/SOB. No c/o.  Objective:   VITALS:   Vitals:   04/19/19 1631 04/19/19 2005 04/19/19 2151 04/20/19 0358  BP: (!) 123/50 (!) 126/47  (!) 143/54  Pulse: 88 94 (!) 103 85  Resp: 16 (!) 22 20 16   Temp: 98.4 F (36.9 C) 99.5 F (37.5 C)  98.5 F (36.9 C)  TempSrc: Oral Oral    SpO2: 100% 93% 96% 95%  Weight:      Height:        NAD  RLE:  iVAC intact (+) TA, GS, EHL SILT 2+ DP  LLE: Splint intact Wiggles toes BCR, SILT toes  Lab Results  Component Value Date   WBC 13.9 (H) 04/20/2019   HGB 10.6 (L) 04/20/2019   HCT 34.2 (L) 04/20/2019   MCV 88.4 04/20/2019   PLT 174 04/20/2019   BMET    Component Value Date/Time   NA 138 04/20/2019 0414   K 4.3 04/20/2019 0414   CL 104 04/20/2019 0414   CO2 23 04/20/2019 0414   GLUCOSE 319 (H) 04/20/2019 0414   BUN 32 (H) 04/20/2019 0414   CREATININE 0.74 04/20/2019 0414   CALCIUM 8.7 (L) 04/20/2019 0414   GFRNONAA >60 04/20/2019 0414   GFRAA >60 04/20/2019 0414     Assessment/Plan: 1 Day Post-Op   Principal Problem:   Closed right femoral fracture (HCC) Active Problems:   Morbid obesity ([place])   Type 2 diabetes mellitus ([place])   Pure hypercholesterolemia   Essential hypertension, benign   Sleep apnea   Glaucoma   Displaced supracondylar fracture of distal end of right femur without intracondylar extension ([place])   NWB BLE DVT ppx: Lovenox, SCDs, TEDS PO pain control PT/OT Dispo: D/C planning, switch to portable Prevena unit upon d/c - @ BS   Hilton Cork Lisle Skillman 04/20/2019, 11:38 AM   Rod Can, MD Cell: [phone] [place] [facility] now [facility]  [facility] [address], [place], [place], [place] [address] Phone: [phone] [email or web address] Facebook  [employer]

## 2019-04-21 DIAGNOSIS — B379 Candidiasis, unspecified: Secondary | ICD-10-CM

## 2019-04-21 LAB — GLUCOSE, CAPILLARY
Glucose-Capillary: 113 mg/dL — ABNORMAL HIGH (ref 70–99)
Glucose-Capillary: 134 mg/dL — ABNORMAL HIGH (ref 70–99)
Glucose-Capillary: 148 mg/dL — ABNORMAL HIGH (ref 70–99)
Glucose-Capillary: 169 mg/dL — ABNORMAL HIGH (ref 70–99)

## 2019-04-21 LAB — CBC
HCT: 28.8 % — ABNORMAL LOW (ref 36.0–46.0)
Hemoglobin: 9.3 g/dL — ABNORMAL LOW (ref 12.0–15.0)
MCH: 28.4 pg (ref 26.0–34.0)
MCHC: 32.3 g/dL (ref 30.0–36.0)
MCV: 88.1 fL (ref 80.0–100.0)
Platelets: 173 10*3/uL (ref 150–400)
RBC: 3.27 MIL/uL — ABNORMAL LOW (ref 3.87–5.11)
RDW: 14.1 % (ref 11.5–15.5)
WBC: 11.3 10*3/uL — ABNORMAL HIGH (ref 4.0–10.5)
nRBC: 0 % (ref 0.0–0.2)

## 2019-04-21 LAB — BASIC METABOLIC PANEL
Anion gap: 10 (ref 5–15)
BUN: 36 mg/dL — ABNORMAL HIGH (ref 8–23)
CO2: 25 mmol/L (ref 22–32)
Calcium: 8.5 mg/dL — ABNORMAL LOW (ref 8.9–10.3)
Chloride: 104 mmol/L (ref 98–111)
Creatinine, Ser: 0.65 mg/dL (ref 0.44–1.00)
GFR calc Af Amer: >60 mL/min (ref >60)
GFR calc non Af Amer: >60 mL/min (ref >60)
Glucose, Bld: 138 mg/dL — ABNORMAL HIGH (ref 70–99)
Potassium: 4 mmol/L (ref 3.5–5.1)
Sodium: 139 mmol/L (ref 135–145)

## 2019-04-21 LAB — URINE CULTURE: Culture: >=100000 — AB

## 2019-04-21 MED ORDER — DIPHENHYDRAMINE HCL 25 MG PO CAPS
25.0000 mg | ORAL_CAPSULE | Freq: Four times a day (QID) | ORAL | Status: DC | PRN
  Administered 2019-04-21 – 2019-04-22 (×2): 25 mg via ORAL
  Filled 2019-04-21 (×2): qty 1

## 2019-04-21 MED ORDER — FLUCONAZOLE 150 MG PO TABS
150.0000 mg | ORAL_TABLET | Freq: Once | ORAL | Status: AC
  Administered 2019-04-21: 150 mg via ORAL
  Filled 2019-04-21 (×2): qty 1

## 2019-04-21 NOTE — Progress Notes (Signed)
Pt informed this nurse that she self administered a 4.7 bolus of insulin at 1830 based off of her reading CBG of 170. Pt stated she "wanted to to bring her sugar down" so she administered herself. Pt educated as to the policy regarding insulin pumps in the hospital. Pt verbalizes understanding. Will continue to monitor.

## 2019-04-21 NOTE — Progress Notes (Signed)
PROGRESS NOTE  Jessica [name] WLS:[id number] DOB: November 07, 1945 DOA: 04/17/2019 PCP: Cari Caraway, MD  HPI/Recap of past 24 hours: Jessica [name] a 73 y.o.year old femalewith medical history significant for anemia, anxiety/depression, osteoarthritis, cataracts, glaucoma, type 2 diabetes mellitus, history of diastolic dysfunction, history of migraine headaches, heart murmur, urolithiasis, hyperlipidemia, hypertension, diabetic peripheral neuropathy, obesity, sleep apnea who presented on6/23/2020with unwitnessed mechanical fall in the bathroom and landing on right hip and was found to have acute right sided distal femur fracture ( displaced) and nondisplaced left ankle fracture underwent operative repair with incisional VAC placed for femur fracture on 6/25 and splint/supportive care for ankle fracture.   Patient today has been complaining of yeast infection with vaginal cottage cheeselike discharge.  Other than that, no complaint.  Pain pretty well controlled in her leg.  Assessment/Plan: Principal Problem:   Closed right femoral fracture (HCC) Active Problems:   Morbid obesity ([place])   Type 2 diabetes mellitus ([place])   Pure hypercholesterolemia   Essential hypertension, benign   Sleep apnea   Glaucoma   Displaced supracondylar fracture of distal end of right femur without intracondylar extension ([place]) 1. Acute displaced right distal femur fracture s/p mechanical Fall.s/pORIF on 6/25 by Orthopedics. PT to evaluate afterwards for disposition. Non weight bearing, lovenox for prophylaxis start 6/26, f/u for removal of VAC 7 days s/p discarge, pain control IV fentanyl for severe break through, norco for PRN moderate 2. Yeast infection: Not on any ongoing antibiotics.  1 dose of Diflucan ordered. 3. Nondisplaced left ankle fracture.Supportive care with brace and non-weight bearing bilaterally per orthopedics 4. T2DM, well controlled A1c 6.9%.On home insulin pump but ran out of  insulin doing sliding scale, diabetes coordinator consulted. Monitor CBGs-fasting CBGs.  CBGs much more stable today. 5. Hypertension.At goal continue home losartan and lopressor. Heldhome lasix and spirinolactone prior toprocedure.  Spironolactone resumed 6. E. coli bacteriuria, asymptomatic UA with moderate leuks, patient denies any urinary symptoms 7. HLD.Home zocor 8. CHF with preserved EF.euvolemic on exam, monitor volume status, daily weights, BP control.  9. OSA.CPAP nightly  10. Morbid obesity. Meets criteria with BMI greater than 40.  Nutritionist on board. Ensure supllementation, prostat, multivitamin, diabetic diet  Code Status: Full code  Family Communication: Updated daughter.  Disposition Plan: Anticipate discharge to skilled nursing, hopefully early next week   Consultants:  Orthopedic surgery  Procedures:  Status post ORIF 6/25  Antimicrobials:  Ancef 6/26 x 1  Diflucan 6/27 x 1  DVT prophylaxis: Lovenox   Objective: Vitals:   04/21/19 0418 04/21/19 1339  BP: (!) 116/55 (!) 117/52  Pulse: 77 73  Resp: 18 18  Temp: 99 F (37.2 C) 100.2 F (37.9 C)  SpO2: 96% 96%    Intake/Output Summary (Last 24 hours) at 04/21/2019 1634 Last data filed at 04/21/2019 1207 Gross per 24 hour  Intake 120 ml  Output 1000 ml  Net -880 ml   Filed Weights   04/17/19 1408 04/19/19 0647  Weight: 108 kg 108.4 kg   Body mass index is 48.27 kg/m.  Exam:   General: Alert and oriented x3, no acute distress  HEENT: Normocephalic and atraumatic, mucous membranes slightly dry  Cardiovascular: Regular rate and rhythm, S1-S2  Respiratory: Clear to auscultation bilaterally  Abdomen: Soft, obese, nontender, positive bowel sounds  Musculoskeletal: Right leg immobilized  Skin: No skin breaks, tears or lesions other than surgical site  Psychiatry: Appropriate, no evidence of psychoses  Neuro: No focal deficits   Data Reviewed: CBC: Recent  Labs  Lab  04/17/19 1437 04/19/19 1750 04/20/19 0414 04/21/19 0439  WBC 15.9* 15.0* 13.9* 11.3*  NEUTROABS 13.1*  --   --   --   HGB 13.4 11.2* 10.6* 9.3*  HCT 41.9 35.2* 34.2* 28.8*  MCV 85.9 88.7 88.4 88.1  PLT 202 172 174 696   Basic Metabolic Panel: Recent Labs  Lab 04/17/19 1437 04/19/19 1750 04/20/19 0414 04/21/19 0439  NA 137  --  138 139  K 4.5  --  4.3 4.0  CL 100  --  104 104  CO2 28  --  23 25  GLUCOSE 133*  --  319* 138*  BUN 21  --  32* 36*  CREATININE 0.84 0.79 0.74 0.65  CALCIUM 9.4  --  8.7* 8.5*   GFR: Estimated Creatinine Clearance: 71.6 mL/min (by C-G formula based on SCr of 0.65 mg/dL). Liver Function Tests: No results for input(s): AST, ALT, ALKPHOS, BILITOT, PROT, ALBUMIN in the last 168 hours. No results for input(s): LIPASE, AMYLASE in the last 168 hours. No results for input(s): AMMONIA in the last 168 hours. Coagulation Profile: Recent Labs  Lab 04/17/19 1437  INR 1.0   Cardiac Enzymes: No results for input(s): CKTOTAL, CKMB, CKMBINDEX, TROPONINI in the last 168 hours. BNP (last 3 results) No results for input(s): PROBNP in the last 8760 hours. HbA1C: No results for input(s): HGBA1C in the last 72 hours. CBG: Recent Labs  Lab 04/20/19 1711 04/20/19 2122 04/21/19 0738 04/21/19 1216 04/21/19 1614  GLUCAP 205* 173* 113* 134* 148*   Lipid Profile: No results for input(s): CHOL, HDL, LDLCALC, TRIG, CHOLHDL, LDLDIRECT in the last 72 hours. Thyroid Function Tests: No results for input(s): TSH, T4TOTAL, FREET4, T3FREE, THYROIDAB in the last 72 hours. Anemia Panel: No results for input(s): VITAMINB12, FOLATE, FERRITIN, TIBC, IRON, RETICCTPCT in the last 72 hours. Urine analysis:    Component Value Date/Time   COLORURINE YELLOW 04/11/2008 0910   APPEARANCEUR CLEAR 04/11/2008 0910   LABSPEC 1.017 04/11/2008 0910   PHURINE 6.0 04/11/2008 0910   GLUCOSEU NEGATIVE 04/11/2008 0910   HGBUR NEGATIVE 04/11/2008 0910   BILIRUBINUR NEGATIVE 04/11/2008  0910   KETONESUR NEGATIVE 04/11/2008 0910   PROTEINUR NEGATIVE 04/11/2008 0910   UROBILINOGEN 0.2 04/11/2008 0910   NITRITE NEGATIVE 04/11/2008 0910   LEUKOCYTESUR MODERATE (A) 04/11/2008 0910   Sepsis Labs: @LABRCNTIP (procalcitonin:4,lacticidven:4)  ) Recent Results (from the past 240 hour(s))  SARS Coronavirus 2 (CEPHEID - Performed in [facility] hospital lab), Hosp Order     Status: None   Collection Time: 04/17/19  4:15 PM   Specimen: Nasopharyngeal Swab  Result Value Ref Range Status   SARS Coronavirus 2 NEGATIVE NEGATIVE Final    Comment: (NOTE) If result is NEGATIVE SARS-CoV-2 target nucleic acids are NOT DETECTED. The SARS-CoV-2 RNA is generally detectable in upper and lower  respiratory specimens during the acute phase of infection. The lowest  concentration of SARS-CoV-2 viral copies this assay can detect is 250  copies / mL. A negative result does not preclude SARS-CoV-2 infection  and should not be used as the sole basis for treatment or other  patient management decisions.  A negative result may occur with  improper specimen collection / handling, submission of specimen other  than nasopharyngeal swab, presence of viral mutation(s) within the  areas targeted by this assay, and inadequate number of viral copies  (<250 copies / mL). A negative result must be combined with clinical  observations, patient history, and epidemiological information. If result  is POSITIVE SARS-CoV-2 target nucleic acids are DETECTED. The SARS-CoV-2 RNA is generally detectable in upper and lower  respiratory specimens dur ing the acute phase of infection.  Positive  results are indicative of active infection with SARS-CoV-2.  Clinical  correlation with patient history and other diagnostic information is  necessary to determine patient infection status.  Positive results do  not rule out bacterial infection or co-infection with other viruses. If result is PRESUMPTIVE POSTIVE SARS-CoV-2  nucleic acids MAY BE PRESENT.   A presumptive positive result was obtained on the submitted specimen  and confirmed on repeat testing.  While 2019 novel coronavirus  (SARS-CoV-2) nucleic acids may be present in the submitted sample  additional confirmatory testing may be necessary for epidemiological  and / or clinical management purposes  to differentiate between  SARS-CoV-2 and other Sarbecovirus currently known to infect humans.  If clinically indicated additional testing with an alternate test  methodology [phone]) is advised. The SARS-CoV-2 RNA is generally  detectable in upper and lower respiratory sp ecimens during the acute  phase of infection. The expected result is Negative. Fact Sheet for Patients:  StrictlyIdeas.no Fact Sheet for Healthcare Providers: BankingDealers.co.za This test is not yet approved or cleared by the [place] FDA and has been authorized for detection and/or diagnosis of SARS-CoV-2 by FDA under an Emergency Use Authorization (EUA).  This EUA will remain in effect (meaning this test can be used) for the duration of the COVID-19 declaration under Section 564(b)(1) of the Act, 21 U.S.C. section 360bbb-3(b)(1), unless the authorization is terminated or revoked sooner. Performed at [facility], [place] [address], [place], [place] [address]   Surgical PCR screen     Status: None   Collection Time: 04/17/19 10:33 PM   Specimen: Nasal Mucosa; Nasal Swab  Result Value Ref Range Status   MRSA, PCR NEGATIVE NEGATIVE Final   Staphylococcus aureus NEGATIVE NEGATIVE Final    Comment: (NOTE) The Xpert SA Assay (FDA approved for NASAL specimens in patients 36 years of age and older), is one component of a comprehensive surveillance program. It is not intended to diagnose infection nor to guide or monitor treatment. Performed at [facility], [place] [address], [place], [place] [address]    Urine Culture     Status: Abnormal   Collection Time: 04/19/19 12:53 PM   Specimen: Urine, Catheterized  Result Value Ref Range Status   Specimen Description   Final    URINE, CATHETERIZED Performed at [facility] [address] [place], [place] [address]    Special Requests   Final    NONE Performed at [facility], [place] [address] [place], [place] [address]    Culture >=100,000 COLONIES/mL ESCHERICHIA COLI (A)  Final   Report Status 04/21/2019 FINAL  Final   Organism ID, Bacteria ESCHERICHIA COLI (A)  Final      Susceptibility   Escherichia coli - MIC*    AMPICILLIN <=2 SENSITIVE Sensitive     CEFAZOLIN <=4 SENSITIVE Sensitive     CEFTRIAXONE <=1 SENSITIVE Sensitive     CIPROFLOXACIN <=0.25 SENSITIVE Sensitive     GENTAMICIN <=1 SENSITIVE Sensitive     IMIPENEM <=0.25 SENSITIVE Sensitive     NITROFURANTOIN <=16 SENSITIVE Sensitive     TRIMETH/SULFA <=20 SENSITIVE Sensitive     AMPICILLIN/SULBACTAM <=2 SENSITIVE Sensitive     PIP/TAZO <=4 SENSITIVE Sensitive     Extended ESBL NEGATIVE Sensitive     * >=100,000 COLONIES/mL ESCHERICHIA  COLI      Studies: No results found.  Scheduled Meds: . cholecalciferol  2,000 Units Oral Daily  . docusate sodium  100 mg Oral BID  . dorzolamide-timolol  1 drop Both Eyes BID  . enoxaparin (LOVENOX) injection  40 mg Subcutaneous Q24H  . feeding supplement (PRO-STAT SUGAR FREE 64)  30 mL Oral Daily  . insulin pump   Subcutaneous TID AC, HS, 0200  . latanoprost  1 drop Both Eyes QHS  . losartan  100 mg Oral Daily  . metoprolol tartrate  100 mg Oral BID  . multivitamin with minerals  1 tablet Oral Daily  . Ensure Max Protein  11 oz Oral Daily  . senna  1 tablet Oral BID  . simvastatin  40 mg Oral QPM  . spironolactone  25 mg Oral Daily    Continuous Infusions:   LOS: 4 days     Jessica Brod, MD [facility]  To reach me or the doctor on call, go to: [email or web address] Password  [facility]  04/21/2019, 4:34 PM

## 2019-04-21 NOTE — Progress Notes (Signed)
   Subjective: 2 Days Post-Op Procedure(s) (LRB): OPEN REDUCTION INTERNAL FIXATION (ORIF) DISTAL FEMUR FRACTURE (Right)  Recheck right thigh s/p ORIF and wound vac placement C/o thick vaginal discharge with history of yeast infection when taking antibiotics Pain is mild to moderate as long as the leg is not moved Otherwise doing fair Keeps CPAP on all day Patient reports pain as moderate.  Objective:   VITALS:   Vitals:   04/21/19 0018 04/21/19 0418  BP: (!) 113/49 (!) 116/55  Pulse: 72 77  Resp: 16 18  Temp: 99 F (37.2 C) 99 F (37.2 C)  SpO2: 96% 96%    Right lateral thigh wound vac in place  No signs of drainage or erythema nv intact distally No rashes  Mild edema distally  LABS Recent Labs    04/19/19 1750 04/20/19 0414 04/21/19 0439  HGB 11.2* 10.6* 9.3*  HCT 35.2* 34.2* 28.8*  WBC 15.0* 13.9* 11.3*  PLT 172 174 173    Recent Labs    04/19/19 1750 04/20/19 0414 04/21/19 0439  NA  --  138 139  K  --  4.3 4.0  BUN  --  32* 36*  CREATININE 0.79 0.74 0.65  GLUCOSE  --  319* 138*     Assessment/Plan: 2 Days Post-Op Procedure(s) (LRB): OPEN REDUCTION INTERNAL FIXATION (ORIF) DISTAL FEMUR FRACTURE (Right) Will treat yeast infection today Pain management  Pulmonary toilet Will continue to monitor her progress    Merla Riches PA-C, [facility] [employer] [facility] [address], [address], [place], [place] [address] Phone: [phone] [email or web address] Facebook  [employer]

## 2019-04-22 LAB — GLUCOSE, CAPILLARY
Glucose-Capillary: 137 mg/dL — ABNORMAL HIGH (ref 70–99)
Glucose-Capillary: 224 mg/dL — ABNORMAL HIGH (ref 70–99)
Glucose-Capillary: 141 mg/dL — ABNORMAL HIGH (ref 70–99)
Glucose-Capillary: 164 mg/dL — ABNORMAL HIGH (ref 70–99)
Glucose-Capillary: 173 mg/dL — ABNORMAL HIGH (ref 70–99)
Glucose-Capillary: 279 mg/dL — ABNORMAL HIGH (ref 70–99)

## 2019-04-22 LAB — CBC
HCT: 29 % — ABNORMAL LOW (ref 36.0–46.0)
Hemoglobin: 9 g/dL — ABNORMAL LOW (ref 12.0–15.0)
MCH: 27.5 pg (ref 26.0–34.0)
MCHC: 31 g/dL (ref 30.0–36.0)
MCV: 88.7 fL (ref 80.0–100.0)
Platelets: 207 10*3/uL (ref 150–400)
RBC: 3.27 MIL/uL — ABNORMAL LOW (ref 3.87–5.11)
RDW: 14.1 % (ref 11.5–15.5)
WBC: 9.6 10*3/uL (ref 4.0–10.5)
nRBC: 0 % (ref 0.0–0.2)

## 2019-04-22 MED ORDER — CAMPHOR-MENTHOL 0.5-0.5 % EX LOTN
TOPICAL_LOTION | CUTANEOUS | Status: DC | PRN
  Filled 2019-04-22: qty 222

## 2019-04-22 NOTE — Progress Notes (Signed)
Pt requesting vial on insulin pump to be refilled. This nurse refilled with 40mL of insulin, from vial in room at bedside.

## 2019-04-22 NOTE — Progress Notes (Signed)
PROGRESS NOTE  Jessica [name] UQJ:[id number] DOB: [birth year] DOA: 04/17/2019 PCP: Cari Caraway, MD  HPI/Recap of past 24 hours: Jessica [name] a 73 y.o.year old femalewith medical history significant for anemia, anxiety/depression, osteoarthritis, cataracts, glaucoma, type 2 diabetes mellitus, history of diastolic dysfunction, history of migraine headaches, heart murmur, urolithiasis, hyperlipidemia, hypertension, diabetic peripheral neuropathy, obesity, sleep apnea who presented on6/23/2020with unwitnessed mechanical fall in the bathroom and landing on right hip and was found to have acute right sided distal femur fracture ( displaced) and nondisplaced left ankle fracture underwent operative repair with incisional VAC placed for femur fracture on 6/25 and splint/supportive care for ankle fracture.   Had problems with a yeast infection which seems to be improved today after dose of Diflucan yesterday.  Patient has no other complaints.  Assessment/Plan: Principal Problem:   Closed right femoral fracture (HCC) Active Problems:   Morbid obesity ([place])   Type 2 diabetes mellitus ([place])   Pure hypercholesterolemia   Essential hypertension, benign   Sleep apnea   Glaucoma   Displaced supracondylar fracture of distal end of right femur without intracondylar extension ([place]) 1. Acute displaced right distal femur fracture s/p mechanical Fall.s/pORIF on 6/25 by Orthopedics. PT to evaluate afterwards for disposition. Non weight bearing, lovenox for prophylaxis start 6/26, f/u for removal of VAC 7 days s/p discarge, pain control IV fentanyl for severe break through, norco for PRN moderate 2. Yeast infection: Not on any ongoing antibiotics.  Received 1 dose of Diflucan on 6/27 3. Nondisplaced left ankle fracture.Supportive care with brace and non-weight bearing bilaterally per orthopedics 4. T2DM, well controlled A1c 6.9%.On home insulin pump but ran out of insulin doing sliding  scale, diabetes coordinator consulted. Monitor CBGs-fasting CBGs.  CBGs much more stable over the last few days 5. Hypertension.At goal continue home losartan and lopressor. Heldhome lasix and spirinolactone prior toprocedure.  Spironolactone resumed 6. E. coli bacteriuria, asymptomatic UA with moderate leuks, patient denies any urinary symptoms 7. HLD.Home zocor 8. CHF with preserved EF.euvolemic on exam, monitor volume status, daily weights, BP control.  9. OSA.CPAP nightly  10. Morbid obesity. Meets criteria with BMI greater than 40.  Nutritionist on board. Ensure supllementation, prostat, multivitamin, diabetic diet  Code Status: Full code  Family Communication: Updated daughter by phone.  Disposition Plan: Anticipate discharge to skilled nursing, hopefully early next week   Consultants:  Orthopedic surgery  Procedures:  Status post ORIF 6/25  Antimicrobials:  Ancef 6/26 x 1  Diflucan 6/27 x 1  DVT prophylaxis: Lovenox   Objective: Vitals:   04/22/19 0516 04/22/19 1459  BP: (!) 124/50 (!) 137/43  Pulse: 73 78  Resp: 12 17  Temp: 97.9 F (36.6 C) 98.5 F (36.9 C)  SpO2: 97% 96%    Intake/Output Summary (Last 24 hours) at 04/22/2019 1634 Last data filed at 04/22/2019 1625 Gross per 24 hour  Intake 220 ml  Output 1100 ml  Net -880 ml   Filed Weights   04/17/19 1408 04/19/19 0647  Weight: 108 kg 108.4 kg   Body mass index is 48.27 kg/m.  Exam:   General: Alert and oriented x3, no acute distress  HEENT: Normocephalic and atraumatic, mucous membranes slightly dry  Cardiovascular: Regular rate and rhythm, S1-S2  Respiratory: Clear to auscultation bilaterally  Abdomen: Soft, obese, nontender, positive bowel sounds  Musculoskeletal: Right leg immobilized  Skin: No skin breaks, tears or lesions other than surgical site  Psychiatry: Appropriate, no evidence of psychoses  Neuro: No focal deficits  Data Reviewed: CBC: Recent Labs  Lab  04/17/19 1437 04/19/19 1750 04/20/19 0414 04/21/19 0439 04/22/19 0446  WBC 15.9* 15.0* 13.9* 11.3* 9.6  NEUTROABS 13.1*  --   --   --   --   HGB 13.4 11.2* 10.6* 9.3* 9.0*  HCT 41.9 35.2* 34.2* 28.8* 29.0*  MCV 85.9 88.7 88.4 88.1 88.7  PLT 202 172 174 173 093   Basic Metabolic Panel: Recent Labs  Lab 04/17/19 1437 04/19/19 1750 04/20/19 0414 04/21/19 0439  NA 137  --  138 139  K 4.5  --  4.3 4.0  CL 100  --  104 104  CO2 28  --  23 25  GLUCOSE 133*  --  319* 138*  BUN 21  --  32* 36*  CREATININE 0.84 0.79 0.74 0.65  CALCIUM 9.4  --  8.7* 8.5*   GFR: Estimated Creatinine Clearance: 71.6 mL/min (by C-G formula based on SCr of 0.65 mg/dL). Liver Function Tests: No results for input(s): AST, ALT, ALKPHOS, BILITOT, PROT, ALBUMIN in the last 168 hours. No results for input(s): LIPASE, AMYLASE in the last 168 hours. No results for input(s): AMMONIA in the last 168 hours. Coagulation Profile: Recent Labs  Lab 04/17/19 1437  INR 1.0   Cardiac Enzymes: No results for input(s): CKTOTAL, CKMB, CKMBINDEX, TROPONINI in the last 168 hours. BNP (last 3 results) No results for input(s): PROBNP in the last 8760 hours. HbA1C: No results for input(s): HGBA1C in the last 72 hours. CBG: Recent Labs  Lab 04/21/19 1614 04/21/19 2141 04/22/19 0755 04/22/19 1006 04/22/19 1251  GLUCAP 148* 169* 137* 224* 141*   Lipid Profile: No results for input(s): CHOL, HDL, LDLCALC, TRIG, CHOLHDL, LDLDIRECT in the last 72 hours. Thyroid Function Tests: No results for input(s): TSH, T4TOTAL, FREET4, T3FREE, THYROIDAB in the last 72 hours. Anemia Panel: No results for input(s): VITAMINB12, FOLATE, FERRITIN, TIBC, IRON, RETICCTPCT in the last 72 hours. Urine analysis:    Component Value Date/Time   COLORURINE YELLOW 04/11/2008 0910   APPEARANCEUR CLEAR 04/11/2008 0910   LABSPEC 1.017 04/11/2008 0910   PHURINE 6.0 04/11/2008 0910   GLUCOSEU NEGATIVE 04/11/2008 0910   HGBUR NEGATIVE  04/11/2008 0910   BILIRUBINUR NEGATIVE 04/11/2008 0910   KETONESUR NEGATIVE 04/11/2008 0910   PROTEINUR NEGATIVE 04/11/2008 0910   UROBILINOGEN 0.2 04/11/2008 0910   NITRITE NEGATIVE 04/11/2008 0910   LEUKOCYTESUR MODERATE (A) 04/11/2008 0910   Sepsis Labs: @LABRCNTIP (procalcitonin:4,lacticidven:4)  ) Recent Results (from the past 240 hour(s))  SARS Coronavirus 2 (CEPHEID - Performed in [facility] hospital lab), Hosp Order     Status: None   Collection Time: 04/17/19  4:15 PM   Specimen: Nasopharyngeal Swab  Result Value Ref Range Status   SARS Coronavirus 2 NEGATIVE NEGATIVE Final    Comment: (NOTE) If result is NEGATIVE SARS-CoV-2 target nucleic acids are NOT DETECTED. The SARS-CoV-2 RNA is generally detectable in upper and lower  respiratory specimens during the acute phase of infection. The lowest  concentration of SARS-CoV-2 viral copies this assay can detect is 250  copies / mL. A negative result does not preclude SARS-CoV-2 infection  and should not be used as the sole basis for treatment or other  patient management decisions.  A negative result may occur with  improper specimen collection / handling, submission of specimen other  than nasopharyngeal swab, presence of viral mutation(s) within the  areas targeted by this assay, and inadequate number of viral copies  (<250 copies / mL). A negative result  must be combined with clinical  observations, patient history, and epidemiological information. If result is POSITIVE SARS-CoV-2 target nucleic acids are DETECTED. The SARS-CoV-2 RNA is generally detectable in upper and lower  respiratory specimens dur ing the acute phase of infection.  Positive  results are indicative of active infection with SARS-CoV-2.  Clinical  correlation with patient history and other diagnostic information is  necessary to determine patient infection status.  Positive results do  not rule out bacterial infection or co-infection with other  viruses. If result is PRESUMPTIVE POSTIVE SARS-CoV-2 nucleic acids MAY BE PRESENT.   A presumptive positive result was obtained on the submitted specimen  and confirmed on repeat testing.  While 2019 novel coronavirus  (SARS-CoV-2) nucleic acids may be present in the submitted sample  additional confirmatory testing may be necessary for epidemiological  and / or clinical management purposes  to differentiate between  SARS-CoV-2 and other Sarbecovirus currently known to infect humans.  If clinically indicated additional testing with an alternate test  methodology [phone]) is advised. The SARS-CoV-2 RNA is generally  detectable in upper and lower respiratory sp ecimens during the acute  phase of infection. The expected result is Negative. Fact Sheet for Patients:  StrictlyIdeas.no Fact Sheet for Healthcare Providers: BankingDealers.co.za This test is not yet approved or cleared by the [place] FDA and has been authorized for detection and/or diagnosis of SARS-CoV-2 by FDA under an Emergency Use Authorization (EUA).  This EUA will remain in effect (meaning this test can be used) for the duration of the COVID-19 declaration under Section 564(b)(1) of the Act, 21 U.S.C. section 360bbb-3(b)(1), unless the authorization is terminated or revoked sooner. Performed at [facility], [place] [address], [place], [place] [address]   Surgical PCR screen     Status: None   Collection Time: 04/17/19 10:33 PM   Specimen: Nasal Mucosa; Nasal Swab  Result Value Ref Range Status   MRSA, PCR NEGATIVE NEGATIVE Final   Staphylococcus aureus NEGATIVE NEGATIVE Final    Comment: (NOTE) The Xpert SA Assay (FDA approved for NASAL specimens in patients 45 years of age and older), is one component of a comprehensive surveillance program. It is not intended to diagnose infection nor to guide or monitor treatment. Performed at [facility], [place] [address] [place], [place] [address]   Urine Culture     Status: Abnormal   Collection Time: 04/19/19 12:53 PM   Specimen: Urine, Catheterized  Result Value Ref Range Status   Specimen Description   Final    URINE, CATHETERIZED Performed at [facility] [address], [place], [place] [address]    Special Requests   Final    NONE Performed at [facility], [place] [address], [place], [place] [address]    Culture >=100,000 COLONIES/mL ESCHERICHIA COLI (A)  Final   Report Status 04/21/2019 FINAL  Final   Organism ID, Bacteria ESCHERICHIA COLI (A)  Final      Susceptibility   Escherichia coli - MIC*    AMPICILLIN <=2 SENSITIVE Sensitive     CEFAZOLIN <=4 SENSITIVE Sensitive     CEFTRIAXONE <=1 SENSITIVE Sensitive     CIPROFLOXACIN <=0.25 SENSITIVE Sensitive     GENTAMICIN <=1 SENSITIVE Sensitive     IMIPENEM <=0.25 SENSITIVE Sensitive     NITROFURANTOIN <=16 SENSITIVE Sensitive     TRIMETH/SULFA <=20 SENSITIVE Sensitive     AMPICILLIN/SULBACTAM <=2 SENSITIVE Sensitive     PIP/TAZO <=4 SENSITIVE Sensitive  Extended ESBL NEGATIVE Sensitive     * >=100,000 COLONIES/mL ESCHERICHIA COLI      Studies: No results found.  Scheduled Meds: . cholecalciferol  2,000 Units Oral Daily  . docusate sodium  100 mg Oral BID  . dorzolamide-timolol  1 drop Both Eyes BID  . enoxaparin (LOVENOX) injection  40 mg Subcutaneous Q24H  . feeding supplement (PRO-STAT SUGAR FREE 64)  30 mL Oral Daily  . insulin pump   Subcutaneous TID AC, HS, 0200  . latanoprost  1 drop Both Eyes QHS  . losartan  100 mg Oral Daily  . metoprolol tartrate  100 mg Oral BID  . multivitamin with minerals  1 tablet Oral Daily  . Ensure Max Protein  11 oz Oral Daily  . senna  1 tablet Oral BID  . simvastatin  40 mg Oral QPM  . spironolactone  25 mg Oral Daily    Continuous Infusions:   LOS: 5 days     Annita Brod, MD [facility]  To reach  me or the doctor on call, go to: [email or web address] Password [facility]  04/22/2019, 4:34 PM

## 2019-04-23 ENCOUNTER — Encounter (HOSPITAL_COMMUNITY): Payer: Self-pay | Admitting: Orthopedic Surgery

## 2019-04-23 LAB — GLUCOSE, CAPILLARY
Glucose-Capillary: 163 mg/dL — ABNORMAL HIGH (ref 70–99)
Glucose-Capillary: 141 mg/dL — ABNORMAL HIGH (ref 70–99)
Glucose-Capillary: 197 mg/dL — ABNORMAL HIGH (ref 70–99)
Glucose-Capillary: 222 mg/dL — ABNORMAL HIGH (ref 70–99)
Glucose-Capillary: 211 mg/dL — ABNORMAL HIGH (ref 70–99)

## 2019-04-23 MED ORDER — POLYETHYLENE GLYCOL 3350 17 G PO PACK
17.0000 g | PACK | Freq: Every day | ORAL | Status: DC
  Administered 2019-04-23 – 2019-04-26 (×2): 17 g via ORAL
  Filled 2019-04-23 (×6): qty 1

## 2019-04-23 NOTE — Progress Notes (Signed)
Inpatient Rehabilitation Admissions Coordinator  Asked by RN CM to assess pt for an inpt rehab admit. Pt is NWB bilateral LES. Lives alone. I concur with therapy eval on 6/26 for SNF at this time.  Danne Baxter, RN, MSN Rehab Admissions Coordinator [phone] 04/23/2019 10:55 AM

## 2019-04-23 NOTE — Progress Notes (Signed)
PROGRESS NOTE  Jessica [name]:[id number] DOB: Apr 07, 1946 DOA: 04/17/2019 PCP: Cari Caraway, MD  HPI/Recap of past 24 hours: [name] a 73 y.o.year old femalewith medical history significant for anemia, anxiety/depression, osteoarthritis, cataracts, glaucoma, type 2 diabetes mellitus, history of diastolic dysfunction, history of migraine headaches, heart murmur, urolithiasis, hyperlipidemia, hypertension, diabetic peripheral neuropathy, obesity, sleep apnea who presented on6/23/2020with unwitnessed mechanical fall in the bathroom and landing on right hip and was found to have acute right sided distal femur fracture ( displaced) and nondisplaced left ankle fracture underwent operative repair with incisional VAC placed for femur fracture on 6/25 and splint/supportive care for ankle fracture.  She dad problems with a yeast infection which seems to be improved 6/28 after dose of Diflucan 6/29 ongoing constipation requesting enema.  Subjective complains of constipation, no other new complaints.  Initially requested CIR but is not a candidate. No acute events overnight.  Assessment/Plan:  Closed right femoral fracture supracondylar of the distal end of right femur without intercondylar extension:s/pORIF on 6/25 by Orthopedics.  PT OT has recommended skilled nursing facility, not a candidate for CIR as requested by patient.  Waiting for bed offers for SNF.  Continue PT OT as per orthopedic, Cont lovenox for prophylaxis started 6/26, f/u for removal of VAC 7 days s/p discarge, pain control IV fentanyl for severe break through, along with norco for PRN moderate.  Constipation:requesting enema.  Continue on Senokot and MiraLAX.  Yeast infection:improved after Diflucan times one 6/27.  Nondisplaced left ankle fracture:Continue non-weight bearing bilaterally per ortho, continue brace, PAIN CONTROL.  T2DM, well :A1c 6.9%. Remains on home insulin infusion and sugar is stable.     Hypertension. Controlled on home losartan and lopressor. Heldhome lasix and spirinolactone prior toprocedure and spironolactone resumed  E. coli bacteriuria, asymptomatic UA with moderate leuks, patient denies any urinary symptoms  HLD: Continue home home zocor  CHF with preserved EF: Compensated. Cont aldactone  OSA.CPAP nightly   Morbid obesity with BMI 48, advised weight loss, healthy lifestyle   COVID 19-1 6/23. Resend COVID-19 today in preparation for SNF placement.  Code Status:Full code  Family Communication:Updated daughter over the phone.  She was appreciative of the care and call.  Disposition Plan: Skilled nursing facility pending placement.  Consultants:  Orthopedic surgery  Procedures:  Status post ORIF 6/25  Antimicrobials:  Ancef 6/26 x 1  Diflucan 6/27 x 1  DVT prophylaxis:Lovenox  Objective: Vitals:   04/22/19 2135 04/23/19 0152  BP: (!) 135/112 (!) 126/50  Pulse: 94 70  Resp: 18   Temp: 99.2 F (37.3 C) 99.6 F (37.6 C)  SpO2: 96%     Intake/Output Summary (Last 24 hours) at 04/23/2019 1305 Last data filed at 04/23/2019 1026 Gross per 24 hour  Intake 243 ml  Output 1450 ml  Net -1207 ml   Filed Weights   04/17/19 1408 04/19/19 0647  Weight: 108 kg 108.4 kg   Body mass index is 48.27 kg/m.  Exam:  General exam: Calm, comfortable, obese, not in acute distress, older for age, average built.  HEENT:Oral mucosa moist, Ear/Nose WNL grossly, dentition normal. Respiratory system: Bilateral equal air entry, no crackles and wheezing, no use of accessory muscle, non tender on palpation. Cardiovascular system: regular rate and rhythm, S1 & S2 heard, No JVD/murmurs. Gastrointestinal system: Abdomen soft, non-tender, non-distended, BS +. No hepatosplenomegaly palpable. Nervous System:Alert, awake and oriented at baseline. Able to move UE and LE, sensation intact. Extremities: Right lower thigh with clean dry and  intact dressing, left  ankle with brace able to moves toes and are pink.  Skin: No rashes,no icterus. MSK: Normal muscle bulk,tone, power.  Data Reviewed: CBC: Recent Labs  Lab 04/17/19 1437 04/19/19 1750 04/20/19 0414 04/21/19 0439 04/22/19 0446  WBC 15.9* 15.0* 13.9* 11.3* 9.6  NEUTROABS 13.1*  --   --   --   --   HGB 13.4 11.2* 10.6* 9.3* 9.0*  HCT 41.9 35.2* 34.2* 28.8* 29.0*  MCV 85.9 88.7 88.4 88.1 88.7  PLT 202 172 174 173 109   Basic Metabolic Panel: Recent Labs  Lab 04/17/19 1437 04/19/19 1750 04/20/19 0414 04/21/19 0439  NA 137  --  138 139  K 4.5  --  4.3 4.0  CL 100  --  104 104  CO2 28  --  23 25  GLUCOSE 133*  --  319* 138*  BUN 21  --  32* 36*  CREATININE 0.84 0.79 0.74 0.65  CALCIUM 9.4  --  8.7* 8.5*   GFR: Estimated Creatinine Clearance: 71.6 mL/min (by C-G formula based on SCr of 0.65 mg/dL). Liver Function Tests: No results for input(s): AST, ALT, ALKPHOS, BILITOT, PROT, ALBUMIN in the last 168 hours. No results for input(s): LIPASE, AMYLASE in the last 168 hours. No results for input(s): AMMONIA in the last 168 hours. Coagulation Profile: Recent Labs  Lab 04/17/19 1437  INR 1.0   Cardiac Enzymes: No results for input(s): CKTOTAL, CKMB, CKMBINDEX, TROPONINI in the last 168 hours. BNP (last 3 results) No results for input(s): PROBNP in the last 8760 hours. HbA1C: No results for input(s): HGBA1C in the last 72 hours. CBG: Recent Labs  Lab 04/22/19 1733 04/22/19 2138 04/23/19 0149 04/23/19 0824 04/23/19 1139  GLUCAP 173* 279* 163* 141* 197*   Lipid Profile: No results for input(s): CHOL, HDL, LDLCALC, TRIG, CHOLHDL, LDLDIRECT in the last 72 hours. Thyroid Function Tests: No results for input(s): TSH, T4TOTAL, FREET4, T3FREE, THYROIDAB in the last 72 hours. Anemia Panel: No results for input(s): VITAMINB12, FOLATE, FERRITIN, TIBC, IRON, RETICCTPCT in the last 72 hours. Urine analysis:    Component Value Date/Time   COLORURINE YELLOW 04/11/2008 0910    APPEARANCEUR CLEAR 04/11/2008 0910   LABSPEC 1.017 04/11/2008 0910   PHURINE 6.0 04/11/2008 0910   GLUCOSEU NEGATIVE 04/11/2008 0910   HGBUR NEGATIVE 04/11/2008 0910   BILIRUBINUR NEGATIVE 04/11/2008 0910   KETONESUR NEGATIVE 04/11/2008 0910   PROTEINUR NEGATIVE 04/11/2008 0910   UROBILINOGEN 0.2 04/11/2008 0910   NITRITE NEGATIVE 04/11/2008 0910   LEUKOCYTESUR MODERATE (A) 04/11/2008 0910   Sepsis Labs: @LABRCNTIP (procalcitonin:4,lacticidven:4)  ) Recent Results (from the past 240 hour(s))  SARS Coronavirus 2 (CEPHEID - Performed in [facility] hospital lab), Hosp Order     Status: None   Collection Time: 04/17/19  4:15 PM   Specimen: Nasopharyngeal Swab  Result Value Ref Range Status   SARS Coronavirus 2 NEGATIVE NEGATIVE Final    Comment: (NOTE) If result is NEGATIVE SARS-CoV-2 target nucleic acids are NOT DETECTED. The SARS-CoV-2 RNA is generally detectable in upper and lower  respiratory specimens during the acute phase of infection. The lowest  concentration of SARS-CoV-2 viral copies this assay can detect is 250  copies / mL. A negative result does not preclude SARS-CoV-2 infection  and should not be used as the sole basis for treatment or other  patient management decisions.  A negative result may occur with  improper specimen collection / handling, submission of specimen other  than nasopharyngeal swab, presence of  viral mutation(s) within the  areas targeted by this assay, and inadequate number of viral copies  (<250 copies / mL). A negative result must be combined with clinical  observations, patient history, and epidemiological information. If result is POSITIVE SARS-CoV-2 target nucleic acids are DETECTED. The SARS-CoV-2 RNA is generally detectable in upper and lower  respiratory specimens dur ing the acute phase of infection.  Positive  results are indicative of active infection with SARS-CoV-2.  Clinical  correlation with patient history and other diagnostic  information is  necessary to determine patient infection status.  Positive results do  not rule out bacterial infection or co-infection with other viruses. If result is PRESUMPTIVE POSTIVE SARS-CoV-2 nucleic acids MAY BE PRESENT.   A presumptive positive result was obtained on the submitted specimen  and confirmed on repeat testing.  While 2019 novel coronavirus  (SARS-CoV-2) nucleic acids may be present in the submitted sample  additional confirmatory testing may be necessary for epidemiological  and / or clinical management purposes  to differentiate between  SARS-CoV-2 and other Sarbecovirus currently known to infect humans.  If clinically indicated additional testing with an alternate test  methodology [phone]) is advised. The SARS-CoV-2 RNA is generally  detectable in upper and lower respiratory sp ecimens during the acute  phase of infection. The expected result is Negative. Fact Sheet for Patients:  StrictlyIdeas.no Fact Sheet for Healthcare Providers: BankingDealers.co.za This test is not yet approved or cleared by the [place] FDA and has been authorized for detection and/or diagnosis of SARS-CoV-2 by FDA under an Emergency Use Authorization (EUA).  This EUA will remain in effect (meaning this test can be used) for the duration of the COVID-19 declaration under Section 564(b)(1) of the Act, 21 U.S.C. section 360bbb-3(b)(1), unless the authorization is terminated or revoked sooner. Performed at [facility], [place] [address], [place], [place] [address]   Surgical PCR screen     Status: None   Collection Time: 04/17/19 10:33 PM   Specimen: Nasal Mucosa; Nasal Swab  Result Value Ref Range Status   MRSA, PCR NEGATIVE NEGATIVE Final   Staphylococcus aureus NEGATIVE NEGATIVE Final    Comment: (NOTE) The Xpert SA Assay (FDA approved for NASAL specimens in patients 22 years of age and older), is one component of a  comprehensive surveillance program. It is not intended to diagnose infection nor to guide or monitor treatment. Performed at [facility], [place] [address], [place], [place] [address]   Urine Culture     Status: Abnormal   Collection Time: 04/19/19 12:53 PM   Specimen: Urine, Catheterized  Result Value Ref Range Status   Specimen Description   Final    URINE, CATHETERIZED Performed at [facility] [address], [place], [place] [address]    Special Requests   Final    NONE Performed at [facility], [place] [address] [place], [place] [address]    Culture >=100,000 COLONIES/mL ESCHERICHIA COLI (A)  Final   Report Status 04/21/2019 FINAL  Final   Organism ID, Bacteria ESCHERICHIA COLI (A)  Final      Susceptibility   Escherichia coli - MIC*    AMPICILLIN <=2 SENSITIVE Sensitive     CEFAZOLIN <=4 SENSITIVE Sensitive     CEFTRIAXONE <=1 SENSITIVE Sensitive     CIPROFLOXACIN <=0.25 SENSITIVE Sensitive     GENTAMICIN <=1 SENSITIVE Sensitive     IMIPENEM <=0.25 SENSITIVE Sensitive     NITROFURANTOIN <=16 SENSITIVE Sensitive  TRIMETH/SULFA <=20 SENSITIVE Sensitive     AMPICILLIN/SULBACTAM <=2 SENSITIVE Sensitive     PIP/TAZO <=4 SENSITIVE Sensitive     Extended ESBL NEGATIVE Sensitive     * >=100,000 COLONIES/mL ESCHERICHIA COLI      Studies: No results found.  Scheduled Meds: . cholecalciferol  2,000 Units Oral Daily  . docusate sodium  100 mg Oral BID  . dorzolamide-timolol  1 drop Both Eyes BID  . enoxaparin (LOVENOX) injection  40 mg Subcutaneous Q24H  . feeding supplement (PRO-STAT SUGAR FREE 64)  30 mL Oral Daily  . insulin pump   Subcutaneous TID AC, HS, 0200  . latanoprost  1 drop Both Eyes QHS  . losartan  100 mg Oral Daily  . metoprolol tartrate  100 mg Oral BID  . multivitamin with minerals  1 tablet Oral Daily  . polyethylene glycol  17 g Oral Daily  . Ensure Max Protein  11 oz Oral Daily  . senna  1  tablet Oral BID  . simvastatin  40 mg Oral QPM  . spironolactone  25 mg Oral Daily    Continuous Infusions:   LOS: 6 days     Antonieta Pert, MD [facility]  To reach me or the doctor on call, go to: [email or web address] Password [email or web address]  04/23/2019, 1:05 PM

## 2019-04-23 NOTE — Care Management Important Message (Signed)
Important Message  Patient Details IM Letter given to [name]  RN to present to the Patient Name: Jessica [name] MRN: [id number] Date of Birth: [birth year]   Medicare Important Message Given:  Yes     Kerin Salen 04/23/2019, 12:26 PM

## 2019-04-23 NOTE — Progress Notes (Signed)
Physical Therapy Treatment Patient Details Name: Jessica [name] MRN: [id number] DOB: 06-02-1946 Today's Date: 04/23/2019    History of Present Illness Pt is a 73 year old female admitted for fractures after mechanical fall.  Pt sustained an acute displaced right distal femur fracture and s/p ORIF on 6/25 and Nondisplaced left ankle fracture    PT Comments    Pt able to sit EOB with balance improving as she sat.  She was unable to scoot safely with B LE NWB status. Con't to recommend SNF. Recommend OT as well.   Follow Up Recommendations  SNF     Equipment Recommendations  None recommended by PT    Recommendations for Other Services OT consult     Precautions / Restrictions Precautions Precautions: Fall Precaution Comments: NPWT over R LE incision Restrictions Weight Bearing Restrictions: Yes RLE Weight Bearing: Non weight bearing LLE Weight Bearing: Non weight bearing    Mobility  Bed Mobility Overal bed mobility: Needs Assistance Bed Mobility: Rolling;Sidelying to Sit;Sit to Supine Rolling: Max assist;+2 for physical assistance Sidelying to sit: Max assist;+2 for physical assistance   Sit to supine: Total assist;+2 for physical assistance   General bed mobility comments: Rolling for placement of bed pain and re-positioning of pads. She needed support of R LE with rolling.  Cues for hand placement with rail and for sidelying > sit.  Transfers                 General transfer comment: Attempted scooting EOB, but pt unable to safely perform while maintaining NWB status.  Ambulation/Gait                 Stairs             Wheelchair Mobility    Modified Rankin (Stroke Patients Only)       Balance Overall balance assessment: Needs assistance;History of Falls Sitting-balance support: Bilateral upper extremity supported;Feet unsupported(R LE supported on wastebasket) Sitting balance-Leahy Scale: Poor Sitting balance - Comments: Balance  improved as she sat                                     Cognition Arousal/Alertness: Awake/alert Behavior During Therapy: WFL for tasks assessed/performed Overall Cognitive Status: Within Functional Limits for tasks assessed                                        Exercises General Exercises - Lower Extremity Ankle Circles/Pumps: AROM;Both Quad Sets: AROM;Both;Supine(limited on R LE)    General Comments        Pertinent Vitals/Pain Pain Assessment: Faces Faces Pain Scale: Hurts even more Pain Location: R hip Pain Descriptors / Indicators: Grimacing Pain Intervention(s): Limited activity within patient's tolerance;Monitored during session;Premedicated before session;Repositioned    Home Living                      Prior Function            PT Goals (current goals can now be found in the care plan section) Acute Rehab PT Goals PT Goal Formulation: With patient Time For Goal Achievement: 05/03/19 Potential to Achieve Goals: Good Progress towards PT goals: Progressing toward goals    Frequency    Min 2X/week      PT Plan Current plan remains appropriate  Co-evaluation              AM-PAC PT "6 Clicks" Mobility   Outcome Measure  Help needed turning from your back to your side while in a flat bed without using bedrails?: Total Help needed moving from lying on your back to sitting on the side of a flat bed without using bedrails?: Total Help needed moving to and from a bed to a chair (including a wheelchair)?: Total Help needed standing up from a chair using your arms (e.g., wheelchair or bedside chair)?: Total Help needed to walk in hospital room?: Total Help needed climbing 3-5 steps with a railing? : Total 6 Click Score: 6    End of Session Equipment Utilized During Treatment: Other (comment)(Pt donned C-Pap at end of session) Activity Tolerance: Patient limited by fatigue Patient left: in bed;with call  bell/phone within reach Nurse Communication: Mobility status;Weight bearing status PT Visit Diagnosis: Other abnormalities of gait and mobility (R26.89)     Time: 8850-2774 PT Time Calculation (min) (ACUTE ONLY): 35 min  Charges:  $Therapeutic Activity: 23-37 mins                     Santiago Glad L. Tamala Julian, [place] Pager [phone] 04/23/2019    Galen Manila 04/23/2019, 1:21 PM

## 2019-04-23 NOTE — TOC Initial Note (Signed)
Transition of Care [facility]) - Initial/Assessment Note    Patient Details  Name: Jessica [name] MRN: [id number] Date of Birth: 22-Oct-1946  [facility] CM/SW Contact:    Joaquin Courts, RN Phone Number: 04/23/2019, 11:15 AM  Clinical Narrative:   CM spoke with patient at bedside. Patient requests CM look into CIR.   CM spoke with CIR rep Pamala Hurry who states at this time patient is not a candidate for CIR. Patient was informed of CIR response and permission was granted for CM to fax FL2 to area SNF. FL2 completed and faxed to area SNF, awaiting response.                  Expected Discharge Plan: Skilled Nursing Facility Barriers to Discharge: Continued Medical Work up   Patient Goals and CMS Choice Patient states their goals for this hospitalization and ongoing recovery are:: to get better      Expected Discharge Plan and Services Expected Discharge Plan: [place]   Discharge Planning Services: CM Consult   Living arrangements for the past 2 months: Apartment                 DME Arranged: N/A DME Agency: NA       HH Arranged: NA HH Agency: NA        Prior Living Arrangements/Services Living arrangements for the past 2 months: Apartment Lives with:: Self Patient language and need for interpreter reviewed:: Yes Do you feel safe going back to the place where you live?: Yes      Need for Family Participation in Patient Care: Yes (Comment) Care giver support system in place?: Yes (comment)   Criminal Activity/Legal Involvement Pertinent to Current Situation/Hospitalization: No - Comment as needed  Activities of Daily Living Home Assistive Devices/Equipment: CPAP, Wheelchair, Environmental consultant (specify type), Cane (specify quad or straight), Eyeglasses, Insulin Pump, Other (Comment)(ramp) ADL Screening (condition at time of admission) Patient's cognitive ability adequate to safely complete daily activities?: Yes Is the patient deaf or have  difficulty hearing?: No Does the patient have difficulty seeing, even when wearing glasses/contacts?: Yes Does the patient have difficulty concentrating, remembering, or making decisions?: No Patient able to express need for assistance with ADLs?: Yes Does the patient have difficulty dressing or bathing?: Yes Independently performs ADLs?: Yes (appropriate for developmental age) Does the patient have difficulty walking or climbing stairs?: Yes Weakness of Legs: Both Weakness of Arms/Hands: Both  Permission Sought/Granted Permission sought to share information with : Facility Art therapist granted to share information with : Yes, Verbal Permission Granted     Permission granted to share info w AGENCY: permission to Fax FL2 to area SNF        Emotional Assessment Appearance:: Appears stated age Attitude/Demeanor/Rapport: Engaged Affect (typically observed): Accepting Orientation: : Oriented to  Time, Oriented to Situation, Oriented to Place, Oriented to Self   Psych Involvement: No (comment)  Admission diagnosis:  Closed right femoral fracture ([place]) [S72.91XA] Patient Active Problem List   Diagnosis Date Noted  . Displaced supracondylar fracture of distal end of right femur without intracondylar extension ([place]) 04/19/2019  . Closed right femoral fracture ([place]) 04/17/2019  . Sleep apnea   . Glaucoma   . Essential hypertension, benign 09/03/2014  . Morbid obesity ([place]) 03/06/2014  . HTN (hypertension) 03/06/2014  . Vertigo 03/06/2014  . Type 2 diabetes mellitus ([place]) 03/06/2014  . Pure hypercholesterolemia 03/06/2014   PCP:  Cari Caraway, MD Pharmacy:   [facility] [phone] -  [place], [place] [address] Phone: [phone] Fax: [phone]  [facility] [place] [place] [facility] [address] to Registered [employer] Sites [place] [address] Phone: [phone] Fax: [phone]     Social Determinants of Health (SDOH) Interventions    Readmission Risk Interventions No flowsheet data found.

## 2019-04-23 NOTE — NC FL2 (Signed)
[place] LEVEL OF CARE SCREENING TOOL     IDENTIFICATION  Patient Name: Jessica [name] Birthdate: 1946-08-24 Sex: female Admission Date (Current Location): 04/17/2019  [facility] and [place] Number:  Herbalist and Address:  [facility],  [place] [address] [place]      Provider Number: [id number]  Attending Physician Name and Address:  Antonieta Pert, MD  Relative Name and Phone Number:       Current Level of Care: Hospital Recommended Level of Care: [place] Prior Approval Number:    Date Approved/Denied:   PASRR Number: [id number]  Discharge Plan: SNF    Current Diagnoses: Patient Active Problem List   Diagnosis Date Noted  . Displaced supracondylar fracture of distal end of right femur without intracondylar extension ([place]) 04/19/2019  . Closed right femoral fracture ([place]) 04/17/2019  . Sleep apnea   . Glaucoma   . Essential hypertension, benign 09/03/2014  . Morbid obesity ([place]) 03/06/2014  . HTN (hypertension) 03/06/2014  . Vertigo 03/06/2014  . Type 2 diabetes mellitus ([place]) 03/06/2014  . Pure hypercholesterolemia 03/06/2014    Orientation RESPIRATION BLADDER Height & Weight     Self, Time, Situation, Place  Normal, Other (Comment)(cpap) Incontinent Weight: 108.4 kg Height:  4\' 11"  (149.9 cm)  BEHAVIORAL SYMPTOMS/MOOD NEUROLOGICAL BOWEL NUTRITION STATUS      Continent Diet  AMBULATORY STATUS COMMUNICATION OF NEEDS Skin   Total Care Verbally Normal                       Personal Care Assistance Level of Assistance  Bathing, Dressing, Total care Bathing Assistance: Maximum assistance   Dressing Assistance: Maximum assistance Total Care Assistance: Maximum assistance   Functional Limitations Info             SPECIAL CARE FACTORS FREQUENCY  PT (By licensed PT), OT (By licensed OT)     PT Frequency: 5x a week OT Frequency: 5x a week            Contractures Contractures  Info: Not present    Additional Factors Info  Code Status, Allergies Code Status Info: full Allergies Info: Meloxicam, Percocet , Tramadol, Vicodin, Azithromycin, Iodine           Current Medications (04/23/2019):  This is the current hospital active medication list Current Facility-Administered Medications  Medication Dose Route Frequency Provider Last Rate Last Dose  . acetaminophen (TYLENOL) tablet 325-650 mg  325-650 mg Oral Q6H PRN Rod Can, MD      . camphor-menthol [facility]) lotion   Topical PRN Annita Brod, MD      . cholecalciferol (VITAMIN D3) tablet 2,000 Units  2,000 Units Oral Daily Rod Can, MD   2,000 Units at 04/23/19 1007  . diphenhydrAMINE (BENADRYL) capsule 25 mg  25 mg Oral Q6H PRN Annita Brod, MD   25 mg at 04/22/19 1452  . docusate sodium (COLACE) capsule 100 mg  100 mg Oral BID Rod Can, MD   100 mg at 04/23/19 1007  . dorzolamide-timolol (COSOPT) 22.3-6.8 MG/ML ophthalmic solution 1 drop  1 drop Both Eyes BID Rod Can, MD   1 drop at 04/22/19 2250  . enoxaparin (LOVENOX) injection 40 mg  40 mg Subcutaneous Q24H Rod Can, MD   40 mg at 04/23/19 1010  . feeding supplement (PRO-STAT SUGAR FREE 64) liquid 30 mL  30 mL Oral Daily Rod Can, MD   30 mL at 04/23/19 1010  .  HYDROmorphone (DILAUDID) injection 0.5-1 mg  0.5-1 mg Intravenous Q4H PRN Swinteck, Aaron Edelman, MD      . HYDROmorphone (DILAUDID) tablet 1 mg  1 mg Oral Q4H PRN Rod Can, MD   1 mg at 04/23/19 1009  . insulin pump   Subcutaneous TID AC, HS, 0200 Oretha Milch D, MD   1.6 each at 04/23/19 0214  . latanoprost (XALATAN) 0.005 % ophthalmic solution 1 drop  1 drop Both Eyes QHS Rod Can, MD   1 drop at 04/22/19 2250  . losartan (COZAAR) tablet 100 mg  100 mg Oral Daily Rod Can, MD   100 mg at 04/23/19 1008  . menthol-cetylpyridinium (CEPACOL) lozenge 3 mg  1 lozenge Oral PRN Swinteck, Aaron Edelman, MD       Or  . phenol (CHLORASEPTIC) mouth  spray 1 spray  1 spray Mouth/Throat PRN Swinteck, Aaron Edelman, MD      . metoCLOPramide (REGLAN) tablet 5-10 mg  5-10 mg Oral Q8H PRN Swinteck, Aaron Edelman, MD       Or  . metoCLOPramide (REGLAN) injection 5-10 mg  5-10 mg Intravenous Q8H PRN Swinteck, Aaron Edelman, MD      . metoprolol tartrate (LOPRESSOR) tablet 100 mg  100 mg Oral BID Rod Can, MD   100 mg at 04/23/19 1006  . multivitamin with minerals tablet 1 tablet  1 tablet Oral Daily Rod Can, MD   1 tablet at 04/23/19 1007  . ondansetron (ZOFRAN) tablet 4 mg  4 mg Oral Q6H PRN Swinteck, Aaron Edelman, MD       Or  . ondansetron (ZOFRAN) injection 4 mg  4 mg Intravenous Q6H PRN Swinteck, Aaron Edelman, MD      . prochlorperazine (COMPAZINE) injection 10 mg  10 mg Intravenous Q6H PRN Swinteck, Aaron Edelman, MD      . protein supplement (ENSURE MAX) liquid  11 oz Oral Daily Rod Can, MD   11 oz at 04/22/19 2001  . senna (SENOKOT) tablet 8.6 mg  1 tablet Oral BID Rod Can, MD   8.6 mg at 04/23/19 1008  . simvastatin (ZOCOR) tablet 40 mg  40 mg Oral QPM Rod Can, MD   40 mg at 04/22/19 1824  . spironolactone (ALDACTONE) tablet 25 mg  25 mg Oral Daily Rod Can, MD   25 mg at 04/23/19 1008     Discharge Medications: Please see discharge summary for a list of discharge medications.  Relevant Imaging Results:  Relevant Lab Results:   Additional Information SSN [id number]  Joaquin Courts, RN

## 2019-04-24 LAB — NOVEL CORONAVIRUS, NAA (HOSP ORDER, SEND-OUT TO REF LAB; TAT 18-24 HRS): SARS-CoV-2, NAA: NOT DETECTED

## 2019-04-24 LAB — GLUCOSE, CAPILLARY
Glucose-Capillary: 140 mg/dL — ABNORMAL HIGH (ref 70–99)
Glucose-Capillary: 133 mg/dL — ABNORMAL HIGH (ref 70–99)
Glucose-Capillary: 180 mg/dL — ABNORMAL HIGH (ref 70–99)
Glucose-Capillary: 241 mg/dL — ABNORMAL HIGH (ref 70–99)
Glucose-Capillary: 199 mg/dL — ABNORMAL HIGH (ref 70–99)

## 2019-04-24 NOTE — Progress Notes (Signed)
   04/24/19 1500  OT Visit Information  Last OT Received On 04/24/19  Assistance Needed +2  History of Present Illness Pt is a 73 year old female admitted for fractures after mechanical fall.  Pt sustained an acute displaced right distal femur fracture and s/p ORIF on 6/25 and Nondisplaced left ankle fracture  Precautions  Precautions Fall  Precaution Comments wound vac  Pain Assessment  Pain Assessment Faces  Faces Pain Scale 6  Pain Location R hip (with movement)  Pain Descriptors / Indicators Sore  Pain Intervention(s) Limited activity within patient's tolerance;Monitored during session;Repositioned  Cognition  Arousal/Alertness Awake/alert  Behavior During Therapy WFL for tasks assessed/performed  Overall Cognitive Status Within Functional Limits for tasks assessed  ADL  General ADL Comments brought written HEP sheets and 3 pieces of level one t-band to work with. Reviewed HEP and pt verbalizes understanding; she had worked through this earlier. While in room, pt requesting purewick out due to pain when urinating.  Assisted NT with hygiene/rolling and brought female urinal to try next time, otherwise, pt needs to roll for bedapn  Bed Mobility  Rolling Mod assist;+2 for physical assistance  Restrictions  RLE Weight Bearing NWB  LLE Weight Bearing NWB  OT - End of Session  Activity Tolerance Patient tolerated treatment well  Patient left in bed;with call bell/phone within reach;with bed alarm set  OT Assessment/Plan  OT Visit Diagnosis Muscle weakness (generalized) (M62.81)  OT Frequency (ACUTE ONLY) Min 2X/week  Follow Up Recommendations SNF  OT Equipment  (tba further)  AM-PAC OT "6 Clicks" Daily Activity Outcome Measure (Version 2)  Help from another person eating meals? 4  Help from another person taking care of personal grooming? 3  Help from another person toileting, which includes using toliet, bedpan, or urinal? 1  Help from another person bathing (including washing,  rinsing, drying)? 2  Help from another person to put on and taking off regular upper body clothing? 3  Help from another person to put on and taking off regular lower body clothing? 1  6 Click Score 14  OT Goal Progression  Progress towards OT goals Progressing toward goals  OT Time Calculation  OT Start Time (ACUTE ONLY) 1415  OT Stop Time (ACUTE ONLY) 1423  OT Time Calculation (min) 8 min  OT General Charges  $OT Visit 1 Visit  OT Treatments  $Self Care/Home Management  8-22 mins  Lesle Chris, OTR/L Acute Rehabilitation Services [phone] [facility] pager [phone] office 04/24/2019

## 2019-04-24 NOTE — TOC Progression Note (Signed)
Transition of Care [facility]) - Progression Note    Patient Details  Name: KRISTYANA NOTTE MRN: [id number] Date of Birth: [birth year]  [facility]) CM/SW Contact  Joaquin Courts, RN Phone Number: 04/24/2019, 11:43 AM  Clinical Narrative:    Patient presented with bed offers and presented with cms ratings for facilities. Patient states will review and make a decision.   Expected Discharge Plan: [place] Barriers to Discharge: Continued Medical Work up  Expected Discharge Plan and Services Expected Discharge Plan: [place]   Discharge Planning Services: CM Consult   Living arrangements for the past 2 months: Apartment                 DME Arranged: N/A DME Agency: NA       HH Arranged: NA HH Agency: NA         Social Determinants of Health (SDOH) Interventions    Readmission Risk Interventions No flowsheet data found.

## 2019-04-24 NOTE — TOC Progression Note (Signed)
Transition of Care [facility]) - Progression Note    Patient Details  Name: Jessica [name] MRN: [id number] Date of Birth: 12-05-1945  Transition [facility] Care [facility]) CM/SW Contact  Joaquin Courts, RN Phone Number: 04/24/2019, 1:52 PM  Clinical Narrative:    Pt selects [facility].    Expected Discharge Plan: [place] Barriers to Discharge: Continued Medical Work up  Expected Discharge Plan and Services Expected Discharge Plan: [place]   Discharge Planning Services: CM Consult   Living arrangements for the past 2 months: Apartment                 DME Arranged: N/A DME Agency: NA       HH Arranged: NA HH Agency: NA         Social Determinants of Health (SDOH) Interventions    Readmission Risk Interventions No flowsheet data found.

## 2019-04-24 NOTE — Progress Notes (Signed)
PROGRESS NOTE  Jessica [name] YHC:[id number] DOB: 16-Oct-1946 DOA: 04/17/2019 PCP: Jessica Caraway, MD  HPI/Recap of past 24 hours: Jessica [name] a 73 y.o.year old femalewith medical history significant for anemia, anxiety/depression, osteoarthritis, cataracts, glaucoma, type 2 diabetes mellitus, history of diastolic dysfunction, history of migraine headaches, heart murmur, urolithiasis, hyperlipidemia, hypertension, diabetic peripheral neuropathy, obesity, sleep apnea who presented on6/23/2020with unwitnessed mechanical fall in the bathroom and landing on right hip and was found to have acute right sided distal femur fracture ( displaced) and nondisplaced left ankle fracture underwent operative repair with incisional VAC placed for femur fracture on 6/25 and splint/supportive care for ankle fracture.  She dad problems with a yeast infection which seems to be improved 6/28 after dose of Diflucan 6/29 ongoing constipation requested enema.  Subjective Denies any new complaints.  Pain is controlled. No nausea vomiting fever chills. No acute events overnight.  Assessment/Plan:  Closed right femoral fracture supracondylar of the distal end of right femur without intercondylar extension:s/pORIF on 6/25 by Orthopedics.  PT OT has recommended skilled nursing facility, not a candidate for CIR (requested by patient). Waiting for bed offers for SNF. covid 19 sent 6/29.Cont lovenox for prophylaxis started 6/26, f/u for removal of VAC 7 days s/p discarge, pain control IV fentanyl for severe break through, along with norco for PRN moderate.  Constipation:s/p enema and had BM X2 on 6/29- cont Senokot and MiraLAX.  Yeast infection:improved after Diflucan times one 6/27.  Nondisplaced left ankle fracture:Continue non-weight bearing bilaterally per ortho, continue brace continue.  Continue pain count oral narcotics and Tylenol.  T2DM, well :A1c 6.9%. Remains on home insulin infusion and  sugar is stable overall.    Hypertension. Controlled on home losartan and lopressor. Heldhome lasix and spirinolactone prior toprocedure and spironolactone resumed.  Resume Lasix.  E. coli bacteriuria, asymptomatic UA with moderate leuks, patient denies any urinary symptoms  HLD: Continue home home zocor  CHF with preserved EF: Compensated. Cont aldactone  OSA.CPAP nightly   Morbid obesity with BMI 48, advised weight loss, healthy lifestyle   COVID 19-1 6/23. Resend COVID-19 today in preparation for SNF placement.  Code Status:Full code  Family Communication:Updated daughter over the phone 6/ 29 and aware that she is awaiting snf.no new updated  Disposition Plan: awaiting Skilled nursing facility, repeat covid pending  Consultants:  Orthopedic surgery  Procedures:  Status post ORIF 6/25  Antimicrobials:  Ancef 6/26 x 1  Diflucan 6/27 x 1  DVT prophylaxis:Lovenox  Objective: Vitals:   04/24/19 0508 04/24/19 1052  BP: (!) 115/47 (!) 138/55  Pulse: 71 98  Resp: 16   Temp: 98 F (36.7 C)   SpO2: 97%     Intake/Output Summary (Last 24 hours) at 04/24/2019 1133 Last data filed at 04/24/2019 0507 Gross per 24 hour  Intake -  Output 1500 ml  Net -1500 ml   Filed Weights   04/17/19 1408 04/19/19 0647  Weight: 108 kg 108.4 kg   Body mass index is 48.27 kg/m.  Exam:  General exam: Calm, comfortable, not in acute distress, older for age,obese. HEENT:Oral mucosa moist, Ear/Nose WNL grossly, dentition normal. Respiratory system: Bilateral equal air entry, no crackles and wheezing, no use of accessory muscle, non tender on palpation. Cardiovascular system: regular rate and rhythm, S1 & S2 heard, No JVD/murmurs. Gastrointestinal system: Abdomen soft, non-tender, non-distended, BS +. No hepatosplenomegaly palpable. Nervous System:Alert, awake and oriented at baseline. Able to move UE and LE, sensation intact. Extremities: Left leg with ankle fracture  in brace,  right lower thigh laterally with dressing intact wound VAC in place.   Skin: No rashes,no icterus. MSK: Normal muscle bulk,tone, power.  Data Reviewed: CBC: Recent Labs  Lab 04/17/19 1437 04/19/19 1750 04/20/19 0414 04/21/19 0439 04/22/19 0446  WBC 15.9* 15.0* 13.9* 11.3* 9.6  NEUTROABS 13.1*  --   --   --   --   HGB 13.4 11.2* 10.6* 9.3* 9.0*  HCT 41.9 35.2* 34.2* 28.8* 29.0*  MCV 85.9 88.7 88.4 88.1 88.7  PLT 202 172 174 173 010   Basic Metabolic Panel: Recent Labs  Lab 04/17/19 1437 04/19/19 1750 04/20/19 0414 04/21/19 0439  NA 137  --  138 139  K 4.5  --  4.3 4.0  CL 100  --  104 104  CO2 28  --  23 25  GLUCOSE 133*  --  319* 138*  BUN 21  --  32* 36*  CREATININE 0.84 0.79 0.74 0.65  CALCIUM 9.4  --  8.7* 8.5*   GFR: Estimated Creatinine Clearance: 71.6 mL/min (by C-G formula based on SCr of 0.65 mg/dL). Liver Function Tests: No results for input(s): AST, ALT, ALKPHOS, BILITOT, PROT, ALBUMIN in the last 168 hours. No results for input(s): LIPASE, AMYLASE in the last 168 hours. No results for input(s): AMMONIA in the last 168 hours. Coagulation Profile: Recent Labs  Lab 04/17/19 1437  INR 1.0   Cardiac Enzymes: No results for input(s): CKTOTAL, CKMB, CKMBINDEX, TROPONINI in the last 168 hours. BNP (last 3 results) No results for input(s): PROBNP in the last 8760 hours. HbA1C: No results for input(s): HGBA1C in the last 72 hours. CBG: Recent Labs  Lab 04/23/19 1139 04/23/19 1821 04/23/19 2106 04/24/19 0214 04/24/19 0726  GLUCAP 197* 222* 211* 140* 133*   Lipid Profile: No results for input(s): CHOL, HDL, LDLCALC, TRIG, CHOLHDL, LDLDIRECT in the last 72 hours. Thyroid Function Tests: No results for input(s): TSH, T4TOTAL, FREET4, T3FREE, THYROIDAB in the last 72 hours. Anemia Panel: No results for input(s): VITAMINB12, FOLATE, FERRITIN, TIBC, IRON, RETICCTPCT in the last 72 hours. Urine analysis:    Component Value Date/Time   COLORURINE  YELLOW 04/11/2008 0910   APPEARANCEUR CLEAR 04/11/2008 0910   LABSPEC 1.017 04/11/2008 0910   PHURINE 6.0 04/11/2008 0910   GLUCOSEU NEGATIVE 04/11/2008 0910   HGBUR NEGATIVE 04/11/2008 0910   BILIRUBINUR NEGATIVE 04/11/2008 0910   KETONESUR NEGATIVE 04/11/2008 0910   PROTEINUR NEGATIVE 04/11/2008 0910   UROBILINOGEN 0.2 04/11/2008 0910   NITRITE NEGATIVE 04/11/2008 0910   LEUKOCYTESUR MODERATE (A) 04/11/2008 0910   Sepsis Labs: @LABRCNTIP (procalcitonin:4,lacticidven:4)  ) Recent Results (from the past 240 hour(s))  SARS Coronavirus 2 (CEPHEID - Performed in [place] hospital lab), Hosp Order     Status: None   Collection Time: 04/17/19  4:15 PM   Specimen: Nasopharyngeal Swab  Result Value Ref Range Status   SARS Coronavirus 2 NEGATIVE NEGATIVE Final    Comment: (NOTE) If result is NEGATIVE SARS-CoV-2 target nucleic acids are NOT DETECTED. The SARS-CoV-2 RNA is generally detectable in upper and lower  respiratory specimens during the acute phase of infection. The lowest  concentration of SARS-CoV-2 viral copies this assay can detect is 250  copies / mL. A negative result does not preclude SARS-CoV-2 infection  and should not be used as the sole basis for treatment or other  patient management decisions.  A negative result may occur with  improper specimen collection / handling, submission of specimen other  than nasopharyngeal swab, presence  of viral mutation(s) within the  areas targeted by this assay, and inadequate number of viral copies  (<250 copies / mL). A negative result must be combined with clinical  observations, patient history, and epidemiological information. If result is POSITIVE SARS-CoV-2 target nucleic acids are DETECTED. The SARS-CoV-2 RNA is generally detectable in upper and lower  respiratory specimens dur ing the acute phase of infection.  Positive  results are indicative of active infection with SARS-CoV-2.  Clinical  correlation with patient  history and other diagnostic information is  necessary to determine patient infection status.  Positive results do  not rule out bacterial infection or co-infection with other viruses. If result is PRESUMPTIVE POSTIVE SARS-CoV-2 nucleic acids MAY BE PRESENT.   A presumptive positive result was obtained on the submitted specimen  and confirmed on repeat testing.  While 2019 novel coronavirus  (SARS-CoV-2) nucleic acids may be present in the submitted sample  additional confirmatory testing may be necessary for epidemiological  and / or clinical management purposes  to differentiate between  SARS-CoV-2 and other Sarbecovirus currently known to infect humans.  If clinically indicated additional testing with an alternate test  methodology [phone]) is advised. The SARS-CoV-2 RNA is generally  detectable in upper and lower respiratory sp ecimens during the acute  phase of infection. The expected result is Negative. Fact Sheet for Patients:  StrictlyIdeas.no Fact Sheet for Healthcare Providers: BankingDealers.co.za This test is not yet approved or cleared by the [place] FDA and has been authorized for detection and/or diagnosis of SARS-CoV-2 by FDA under an Emergency Use Authorization (EUA).  This EUA will remain in effect (meaning this test can be used) for the duration of the COVID-19 declaration under Section 564(b)(1) of the Act, 21 U.S.C. section 360bbb-3(b)(1), unless the authorization is terminated or revoked sooner. Performed at [facility], [place] [address], [place], [place] [address]   Surgical PCR screen     Status: None   Collection Time: 04/17/19 10:33 PM   Specimen: Nasal Mucosa; Nasal Swab  Result Value Ref Range Status   MRSA, PCR NEGATIVE NEGATIVE Final   Staphylococcus aureus NEGATIVE NEGATIVE Final    Comment: (NOTE) The Xpert SA Assay (FDA approved for NASAL specimens in patients 15 years of age and older),  is one component of a comprehensive surveillance program. It is not intended to diagnose infection nor to guide or monitor treatment. Performed at [facility], [place] [address] [place], [place] [address]   Urine Culture     Status: Abnormal   Collection Time: 04/19/19 12:53 PM   Specimen: Urine, Catheterized  Result Value Ref Range Status   Specimen Description   Final    URINE, CATHETERIZED Performed at [facility] [address] [place], [place] [address]    Special Requests   Final    NONE Performed at [facility], [place] [address], [place], [place] [address]    Culture >=100,000 COLONIES/mL ESCHERICHIA COLI (A)  Final   Report Status 04/21/2019 FINAL  Final   Organism ID, Bacteria ESCHERICHIA COLI (A)  Final      Susceptibility   Escherichia coli - MIC*    AMPICILLIN <=2 SENSITIVE Sensitive     CEFAZOLIN <=4 SENSITIVE Sensitive     CEFTRIAXONE <=1 SENSITIVE Sensitive     CIPROFLOXACIN <=0.25 SENSITIVE Sensitive     GENTAMICIN <=1 SENSITIVE Sensitive     IMIPENEM <=0.25 SENSITIVE Sensitive     NITROFURANTOIN <=16 SENSITIVE Sensitive  TRIMETH/SULFA <=20 SENSITIVE Sensitive     AMPICILLIN/SULBACTAM <=2 SENSITIVE Sensitive     PIP/TAZO <=4 SENSITIVE Sensitive     Extended ESBL NEGATIVE Sensitive     * >=100,000 COLONIES/mL ESCHERICHIA COLI      Studies: No results found.  Scheduled Meds: . cholecalciferol  2,000 Units Oral Daily  . docusate sodium  100 mg Oral BID  . dorzolamide-timolol  1 drop Both Eyes BID  . enoxaparin (LOVENOX) injection  40 mg Subcutaneous Q24H  . feeding supplement (PRO-STAT SUGAR FREE 64)  30 mL Oral Daily  . insulin pump   Subcutaneous TID AC, HS, 0200  . latanoprost  1 drop Both Eyes QHS  . losartan  100 mg Oral Daily  . metoprolol tartrate  100 mg Oral BID  . multivitamin with minerals  1 tablet Oral Daily  . polyethylene glycol  17 g Oral Daily  . Ensure Max Protein  11 oz  Oral Daily  . senna  1 tablet Oral BID  . simvastatin  40 mg Oral QPM  . spironolactone  25 mg Oral Daily    Continuous Infusions:   LOS: 7 days     Antonieta Pert, MD [facility]  To reach me or the doctor on call, go to: [email or web address] Password [email or web address]  04/24/2019, 11:33 AM

## 2019-04-24 NOTE — Evaluation (Signed)
Occupational Therapy Evaluation Patient Details Name: Jessica [name] MRN: [id number] DOB: [birth year] Today's Date: 04/24/2019    History of Present Illness Pt is a 73 year old female admitted for fractures after mechanical fall.  Pt sustained an acute displaced right distal femur fracture and s/p ORIF on 6/25 and Nondisplaced left ankle fracture   Clinical Impression   Pt was admitted for the above.  Seen at bed level for evaluation.  Pt reports L RCT and she is NWB on bil LEs.  Will follow in acute setting with the goals listed below. She will need rehab after acute stay     Follow Up Recommendations  SNF    Equipment Recommendations  Other (comment)(tba further)    Recommendations for Other Services       Precautions / Restrictions Precautions Precaution Comments: wound vac Restrictions RLE Weight Bearing: Non weight bearing LLE Weight Bearing: Non weight bearing      Mobility Bed Mobility               General bed mobility comments: not tested today, was max +2 with PT  Transfers                      Balance                                           ADL either performed or assessed with clinical judgement   ADL Overall ADL's : Needs assistance/impaired Eating/Feeding: Independent(if positioned within reach)   Grooming: Set up   Upper Body Bathing: Minimal assistance   Lower Body Bathing: Total assistance;+2 for physical assistance   Upper Body Dressing : Minimal assistance   Lower Body Dressing: Total assistance;+2 for physical assistance                 General ADL Comments: UB adls limited by L shoulder pain and inability to reach across under arms.  Pt is able to slightly lift LLE off bed--not functional for reacher for ADLs. She would benefit from long sponge for both LEs and under arms     Vision         Perception     Praxis      Pertinent Vitals/Pain Pain Assessment: Faces Faces Pain Scale:  Hurts little more Pain Location: R hip Pain Descriptors / Indicators: Sore Pain Intervention(s): Limited activity within patient's tolerance;Monitored during session     Hand Dominance     Extremity/Trunk Assessment Upper Extremity Assessment Upper Extremity Assessment: LUE deficits/detail;Generalized weakness(grossly 4- to 4/5 strength) LUE Deficits / Details: pt reports she has a L RCT and she has done PT for this           Communication Communication Communication: No difficulties   Cognition Arousal/Alertness: Awake/alert Behavior During Therapy: WFL for tasks assessed/performed Overall Cognitive Status: Within Functional Limits for tasks assessed                                     General Comments       Exercises Other Exercises Other Exercises:worked on level one theraband  Performing L AROM only for FF to 90 and level one with theraband for elbow flexion Other Exercises: RUE, level one theraband for FF, ER and elbow flexion. Other Exercises: Performed one set of 10  of each as well as shoulder retraction   Shoulder Instructions      Home Living Family/patient expects to be discharged to:: Skilled nursing facility Living Arrangements: Alone                                      Prior Functioning/Environment Level of Independence: Independent with assistive device(s)                 OT Problem List: Decreased strength;Decreased range of motion;Decreased activity tolerance;Pain;Decreased knowledge of use of DME or AE      OT Treatment/Interventions: Self-care/ADL training;Therapeutic exercise;Energy conservation;DME and/or AE instruction;Patient/family education;Therapeutic activities(balance as needed)    OT Goals(Current goals can be found in the care plan section) Acute Rehab OT Goals Patient Stated Goal: return to independence OT Goal Formulation: With patient Time For Goal Achievement: 05/08/19 Potential to Achieve  Goals: Good ADL Goals Pt Will Transfer to Toilet: (P) with max assist;with +2 assist(lateral transfer to [facility]) Additional ADL Goal #1: (P) Pt will complete LBB with mod A +2, using AE and rolling vs leaning side to side Additional ADL Goal #2: (P) Pt will be independent with written level one theraband HEP (except AROM for shoulder on LUE)  OT Frequency: Min 2X/week   Barriers to D/C:            Co-evaluation              AM-PAC OT "6 Clicks" Daily Activity     Outcome Measure Help from another person eating meals?: None Help from another person taking care of personal grooming?: A Little Help from another person toileting, which includes using toliet, bedpan, or urinal?: Total Help from another person bathing (including washing, rinsing, drying)?: A Lot Help from another person to put on and taking off regular upper body clothing?: A Little Help from another person to put on and taking off regular lower body clothing?: Total 6 Click Score: 14   End of Session    Activity Tolerance: Patient tolerated treatment well Patient left: in bed;with call bell/phone within reach;with bed alarm set  OT Visit Diagnosis: Muscle weakness (generalized) (M62.81)                Time: 3825-0539 OT Time Calculation (min): 21 min Charges:  OT General Charges $OT Visit: 1 Visit OT Evaluation $OT Eval Moderate Complexity: 1 Mod  Lesle Chris, OTR/L Acute Rehabilitation Services [phone] [facility] pager [phone] office 04/24/2019  [place] 04/24/2019, 2:57 PM

## 2019-04-24 NOTE — Progress Notes (Signed)
Pt stated to RN and CN that she would like [facility] for Rehab.  Message left on CM voicemail. Andre Lefort

## 2019-04-25 DIAGNOSIS — S72341D Displaced spiral fracture of shaft of right femur, subsequent encounter for closed fracture with routine healing: Secondary | ICD-10-CM

## 2019-04-25 LAB — GLUCOSE, CAPILLARY
Glucose-Capillary: 175 mg/dL — ABNORMAL HIGH (ref 70–99)
Glucose-Capillary: 157 mg/dL — ABNORMAL HIGH (ref 70–99)
Glucose-Capillary: 124 mg/dL — ABNORMAL HIGH (ref 70–99)
Glucose-Capillary: 144 mg/dL — ABNORMAL HIGH (ref 70–99)
Glucose-Capillary: 117 mg/dL — ABNORMAL HIGH (ref 70–99)
Glucose-Capillary: 113 mg/dL — ABNORMAL HIGH (ref 70–99)

## 2019-04-25 NOTE — Progress Notes (Signed)
PROGRESS NOTE   Jessica [name]  XBJ:[id number]    DOB: 29-Jan-1946    DOA: 04/17/2019  PCP: Cari Caraway, MD   I have briefly reviewed patients previous medical records in [facility].  Brief Narrative:  73 year old female with PMH of DM 2, diastolic dysfunction, HLD, HTN, diabetic peripheral neuropathy, obesity, OSA on CPAP, presented on 6/23 following an unwitnessed mechanical fall in the bathroom with associated acute displaced right distal femur fracture and nondisplaced left ankle fracture.  S/p right femur ORIF on 6/25 with incisional VAC placement and splint/supportive care of left ankle fracture.   Assessment & Plan:   Principal Problem:   Closed right femoral fracture (HCC) Active Problems:   Morbid obesity ([place])   Type 2 diabetes mellitus ([place])   Pure hypercholesterolemia   Essential hypertension, benign   Sleep apnea   Glaucoma   Displaced supracondylar fracture of distal end of right femur without intracondylar extension ([place])   Closed right distal femur fracture:s/pORIF on 6/25 by Orthopedics.  PT OT has recommended skilled nursing facility, not a candidate for CIR (requested by patient). Waiting for bed offers for SNF. Cont lovenox for prophylaxis started 6/26, f/u for removal of VAC 7 days s/p discarge, continue pain control.  COVID-19 testing now negative in preparation of SNF admission.  Constipation:s/p enema and had BM X2 on 6/29- cont Senokot and MiraLAX.  Continue bowel regimen.  Yeast infection:improved after a dose of Diflucan.  Nondisplaced left ankle fracture:Continue non-weight bearing bilaterally per ortho, continue brace continue.   Continue pain control.  T2DM:A1c 6.9%. Remains on home insulin infusion and reasonable inpatient control.    Essential hypertension. Controlled on home losartan and lopressor. Heldhome lasix and spirinolactone prior toprocedure and both resumed postoperatively.  Asymptomatic E. coli bacteriuria   HLD: Continue home home zocor  CHF with preserved EF: Compensated. Cont aldactone and Lasix  OSA.CPAP nightly   Morbid obesity with BMI 48, advised weight loss, healthy lifestyle   COVID 19-1 6/23.  Repeat COVID-19 testing from 6/29: Negative  Stage II mid sacral ulcer: Linear, clean and without acute findings   DVT prophylaxis: Lovenox Code Status: Full Family Communication: None at bedside Disposition: DC to SNF pending bed availability.   Consultants:  Orthopedic  Procedures:  ORIF of right distal femur fracture 6/25  Antimicrobials:  Ancef 6/26 x 1 Diflucan 6/27 x 1   Subjective: Patient interviewed and examined along with female nurse tech in room as chaperone.  Appropriate postop right lower extremity pain and some left ankle discomfort.  Denies any other complaints.  ROS: As above, otherwise negative  Objective:  Vitals:   04/24/19 2028 04/24/19 2133 04/25/19 0512 04/25/19 1246  BP: (!) 138/55  (!) 142/40 (!) 116/57  Pulse: 85 92 72 70  Resp: 16 18 20 16   Temp: 98.3 F (36.8 C)  97.6 F (36.4 C) 99.2 F (37.3 C)  TempSrc: Oral   Oral  SpO2: 98% 94% 98% 97%  Weight:      Height:        Examination:  General exam: Pleasant elderly female, moderately built and morbidly obese lying comfortably propped up in bed and still had her CPAP machine on this morning. Respiratory system: Clear to auscultation. Respiratory effort normal. Cardiovascular system: S1 & S2 heard, RRR. No JVD, murmurs, rubs, gallops or clicks. No pedal edema.  Sinus rhythm. Gastrointestinal system: Abdomen is nondistended, soft and nontender. No organomegaly or masses felt. Normal bowel sounds heard. Central nervous system: Alert and  oriented. No focal neurological deficits. Extremities: Symmetric 5 x 5 power.  Bilateral lower extremity power assessment limited secondary to pain from surgery and fracture.  Right thigh postop dressing clean and dry.  Mild ecchymosis over right  lateral groin.  Left leg and wrapped. Skin: Small stage II linear mid gluteal cleft ulcer without acute findings. Psychiatry: Judgement and insight appear normal. Mood & affect appropriate.     Data Reviewed: I have personally reviewed following labs and imaging studies  CBC: Recent Labs  Lab 04/19/19 1750 04/20/19 0414 04/21/19 0439 04/22/19 0446  WBC 15.0* 13.9* 11.3* 9.6  HGB 11.2* 10.6* 9.3* 9.0*  HCT 35.2* 34.2* 28.8* 29.0*  MCV 88.7 88.4 88.1 88.7  PLT 172 174 173 353   Basic Metabolic Panel: Recent Labs  Lab 04/19/19 1750 04/20/19 0414 04/21/19 0439  NA  --  138 139  K  --  4.3 4.0  CL  --  104 104  CO2  --  23 25  GLUCOSE  --  319* 138*  BUN  --  32* 36*  CREATININE 0.79 0.74 0.65  CALCIUM  --  8.7* 8.5*   Liver Function Tests: No results for input(s): AST, ALT, ALKPHOS, BILITOT, PROT, ALBUMIN in the last 168 hours. Coagulation Profile: No results for input(s): INR, PROTIME in the last 168 hours. Cardiac Enzymes: No results for input(s): CKTOTAL, CKMB, CKMBINDEX, TROPONINI in the last 168 hours. HbA1C: No results for input(s): HGBA1C in the last 72 hours. CBG: Recent Labs  Lab 04/24/19 2109 04/25/19 0216 04/25/19 0311 04/25/19 0749 04/25/19 1247  GLUCAP 199* 175* 157* 124* 144*    Recent Results (from the past 240 hour(s))  SARS Coronavirus 2 (CEPHEID - Performed in [facility] hospital lab), Hosp Order     Status: None   Collection Time: 04/17/19  4:15 PM   Specimen: Nasopharyngeal Swab  Result Value Ref Range Status   SARS Coronavirus 2 NEGATIVE NEGATIVE Final    Comment: (NOTE) If result is NEGATIVE SARS-CoV-2 target nucleic acids are NOT DETECTED. The SARS-CoV-2 RNA is generally detectable in upper and lower  respiratory specimens during the acute phase of infection. The lowest  concentration of SARS-CoV-2 viral copies this assay can detect is 250  copies / mL. A negative result does not preclude SARS-CoV-2 infection  and should not be  used as the sole basis for treatment or other  patient management decisions.  A negative result may occur with  improper specimen collection / handling, submission of specimen other  than nasopharyngeal swab, presence of viral mutation(s) within the  areas targeted by this assay, and inadequate number of viral copies  (<250 copies / mL). A negative result must be combined with clinical  observations, patient history, and epidemiological information. If result is POSITIVE SARS-CoV-2 target nucleic acids are DETECTED. The SARS-CoV-2 RNA is generally detectable in upper and lower  respiratory specimens dur ing the acute phase of infection.  Positive  results are indicative of active infection with SARS-CoV-2.  Clinical  correlation with patient history and other diagnostic information is  necessary to determine patient infection status.  Positive results do  not rule out bacterial infection or co-infection with other viruses. If result is PRESUMPTIVE POSTIVE SARS-CoV-2 nucleic acids MAY BE PRESENT.   A presumptive positive result was obtained on the submitted specimen  and confirmed on repeat testing.  While 2019 novel coronavirus  (SARS-CoV-2) nucleic acids may be present in the submitted sample  additional confirmatory testing may be necessary  for epidemiological  and / or clinical management purposes  to differentiate between  SARS-CoV-2 and other Sarbecovirus currently known to infect humans.  If clinically indicated additional testing with an alternate test  methodology [phone]) is advised. The SARS-CoV-2 RNA is generally  detectable in upper and lower respiratory sp ecimens during the acute  phase of infection. The expected result is Negative. Fact Sheet for Patients:  StrictlyIdeas.no Fact Sheet for Healthcare Providers: BankingDealers.co.za This test is not yet approved or cleared by the [place] FDA and has been authorized  for detection and/or diagnosis of SARS-CoV-2 by FDA under an Emergency Use Authorization (EUA).  This EUA will remain in effect (meaning this test can be used) for the duration of the COVID-19 declaration under Section 564(b)(1) of the Act, 21 U.S.C. section 360bbb-3(b)(1), unless the authorization is terminated or revoked sooner. Performed at [facility], [place] [address] [place], [place] [address]   Surgical PCR screen     Status: None   Collection Time: 04/17/19 10:33 PM   Specimen: Nasal Mucosa; Nasal Swab  Result Value Ref Range Status   MRSA, PCR NEGATIVE NEGATIVE Final   Staphylococcus aureus NEGATIVE NEGATIVE Final    Comment: (NOTE) The Xpert SA Assay (FDA approved for NASAL specimens in patients 65 years of age and older), is one component of a comprehensive surveillance program. It is not intended to diagnose infection nor to guide or monitor treatment. Performed at [facility], [place] [address] [place], [place] [address]   Urine Culture     Status: Abnormal   Collection Time: 04/19/19 12:53 PM   Specimen: Urine, Catheterized  Result Value Ref Range Status   Specimen Description   Final    URINE, CATHETERIZED Performed at [facility] [address], [place], [place] [address]    Special Requests   Final    NONE Performed at [facility], [place] [address] [place], [place] [address]    Culture >=100,000 COLONIES/mL ESCHERICHIA COLI (A)  Final   Report Status 04/21/2019 FINAL  Final   Organism ID, Bacteria ESCHERICHIA COLI (A)  Final      Susceptibility   Escherichia coli - MIC*    AMPICILLIN <=2 SENSITIVE Sensitive     CEFAZOLIN <=4 SENSITIVE Sensitive     CEFTRIAXONE <=1 SENSITIVE Sensitive     CIPROFLOXACIN <=0.25 SENSITIVE Sensitive     GENTAMICIN <=1 SENSITIVE Sensitive     IMIPENEM <=0.25 SENSITIVE Sensitive     NITROFURANTOIN <=16 SENSITIVE Sensitive     TRIMETH/SULFA <=20 SENSITIVE Sensitive      AMPICILLIN/SULBACTAM <=2 SENSITIVE Sensitive     PIP/TAZO <=4 SENSITIVE Sensitive     Extended ESBL NEGATIVE Sensitive     * >=100,000 COLONIES/mL ESCHERICHIA COLI  Novel Coronavirus, NAA (hospital order; send-out to ref lab)     Status: None   Collection Time: 04/23/19  5:51 PM   Specimen: Nasopharyngeal Swab; Respiratory  Result Value Ref Range Status   SARS-CoV-2, NAA NOT DETECTED NOT DETECTED Final    Comment: (NOTE) This test was developed and its performance characteristics determined by [employer]. This test has not been FDA cleared or approved. This test has been authorized by FDA under an Emergency Use Authorization (EUA). This test is only authorized for the duration of time the declaration that circumstances exist justifying the authorization of the emergency use of in vitro diagnostic tests for detection of SARS-CoV-2 virus and/or diagnosis of COVID-19 infection under section 564(b)(1)  of the Act, 21 U.S.C. 810FBP-1(W)(2), unless the authorization is terminated or revoked sooner. When diagnostic testing is negative, the possibility of a false negative result should be considered in the context of a patient's recent exposures and the presence of clinical signs and symptoms consistent with COVID-19. An individual without symptoms of COVID-19 and who is not shedding SARS-CoV-2 virus would expect to have a negative (not detected) result in this assay. Performed  At: [facility] [place], [place] [id number] Rush Farmer MD MP:[id number]    [place]  Final    Comment: Performed at [facility] [address] [place], [place] [address]         Radiology Studies: No results found.      Scheduled Meds: . cholecalciferol  2,000 Units Oral Daily  . docusate sodium  100 mg Oral BID  . dorzolamide-timolol  1 drop Both Eyes BID  . enoxaparin (LOVENOX) injection  40 mg Subcutaneous Q24H   . feeding supplement (PRO-STAT SUGAR FREE 64)  30 mL Oral Daily  . insulin pump   Subcutaneous TID AC, HS, 0200  . latanoprost  1 drop Both Eyes QHS  . losartan  100 mg Oral Daily  . metoprolol tartrate  100 mg Oral BID  . multivitamin with minerals  1 tablet Oral Daily  . polyethylene glycol  17 g Oral Daily  . Ensure Max Protein  11 oz Oral Daily  . senna  1 tablet Oral BID  . simvastatin  40 mg Oral QPM  . spironolactone  25 mg Oral Daily   Continuous Infusions:   LOS: 8 days     Vernell Leep, MD, FACP, [facility]. [facility]  To contact the attending provider between 7A-7P or the covering provider during after hours 7P-7A, please log into the web site [email or web address] and access using universal [place] password for that web site. If you do not have the password, please call the hospital operator.  04/25/2019, 3:58 PM

## 2019-04-25 NOTE — Progress Notes (Signed)
Nutrition Follow-up  RD working remotely.  DOCUMENTATION CODES:   Morbid obesity  INTERVENTION:   - Downgrade die to Dysphagia 3 (Carb Modified) for ease of chewing/swallowing as pt reports her throat remains sore post-op  - Continue Ensure Max po daily, each supplement provides 150 kcal and 30 grams of protein  - Continue Pro-stat 30 ml daily, each supplement provides 100 kcal and 15 grams of protein  - Continue MVI with minerals daily  - Encourage adequate PO intake  NUTRITION DIAGNOSIS:   Increased nutrient needs related to acute illness, post-op healing as evidenced by estimated needs.  Ongoing, being addressed via oral nutrition supplements  GOAL:   Patient will meet greater than or equal to 90% of their needs  Progressing  MONITOR:   Diet advancement, PO intake, Supplement acceptance, Labs, Weight trends, Skin  REASON FOR ASSESSMENT:   Consult Assessment of nutrition requirement/status  ASSESSMENT:   73 year-old female with medical history significant of anemia, anxiety/depression, osteoarthritis, cataracts, glaucoma, type 2 DM, hx of diastolic dysfunction, migraine headaches, heart murmur, urolithiasis, hyperlipidemia, HTN, diabetic peripheral neuropathy, obesity, sleep apnea, hx of TIA, and hx of palpitations d/t tachycardia. She presented to the ED after a fall at home while attempting to get into the shower. She hit her R hip and R knee.  6/25 - s/p ORIF right femur with VAC placement  Pt awaiting SNF placement at this time.  No new weights since 6/25.  Reviewed RN edema assessment. Pt with mild pitting edema to RLE and non-pitting edema to LLE.  Spoke with pt via phone call to room. Pt states she is doing well. Pt reports that she is having difficulty eating and finding foods to eat due to a "sore" in the back of her throat that she noticed after her procedure. Pt states that her throat hurts when she swallows harder/more solid foods. RD offered to  downgrade diet to Dysphagia 3 and pt states that would be very helpful for her. Will continue with the Carb Modified portion of diet. RD encouraged pt to take her time with eating and chew foods thoroughly.  Pt endorses drinking the Ensure Max daily and taking the Pro-stat as ordered. Pt states that this is going well for her. Will continue with current supplement regimen.  Pt reports that her appetite is not back to what it used to be. Pt is hopeful that with more time, her appetite will return. Encouraged pt to do her best with meal trays and continue to take supplements to ensure that she is meeting her nutritional needs. Pt expresses understanding.  Meal Completion: 75-100% x last 4 meals (no documentation since 6/28)  Medications reviewed and include: insulin pump, cholecalciferol, Colace, Pro-stat 30 ml daily, MVI with minerals, Miralax, Ensure Max daily, Senna, spironolactone  Labs reviewed: hemoglobin 9.0 (L) CBG's: 124-241 x 24 hours  Diet Order:   Diet Order            DIET DYS 3 Room service appropriate? Yes; Fluid consistency: Thin  Diet effective now              EDUCATION NEEDS:   No education needs have been identified at this time  Skin:  Skin Assessment:  Skin Integrity Issues: Incisions: surgical incision to right leg  Last BM:  04/24/19 small type 4  Height:   Ht Readings from Last 1 Encounters:  04/17/19 4\' 11"  (1.499 m)    Weight:   Wt Readings from Last 1 Encounters:  04/19/19  108.4 kg    Ideal Body Weight:     BMI:  Body mass index is 48.27 kg/m.  Estimated Nutritional Needs:   Kcal:  1850-2050 kcal  Protein:  95-105 grams  Fluid:  >/= 2 L/day    Gaynell Face, MS, RD, LDN Inpatient Clinical Dietitian Pager: [phone] Weekend/After Hours: [phone]

## 2019-04-26 LAB — CREATININE, SERUM
Creatinine, Ser: 0.64 mg/dL (ref 0.44–1.00)
GFR calc Af Amer: >60 mL/min (ref >60)
GFR calc non Af Amer: >60 mL/min (ref >60)

## 2019-04-26 LAB — GLUCOSE, CAPILLARY
Glucose-Capillary: 111 mg/dL — ABNORMAL HIGH (ref 70–99)
Glucose-Capillary: 142 mg/dL — ABNORMAL HIGH (ref 70–99)
Glucose-Capillary: 110 mg/dL — ABNORMAL HIGH (ref 70–99)
Glucose-Capillary: 151 mg/dL — ABNORMAL HIGH (ref 70–99)
Glucose-Capillary: 109 mg/dL — ABNORMAL HIGH (ref 70–99)
Glucose-Capillary: 97 mg/dL (ref 70–99)

## 2019-04-26 MED ORDER — FUROSEMIDE 20 MG PO TABS
20.0000 mg | ORAL_TABLET | Freq: Every day | ORAL | Status: DC
  Administered 2019-04-26 – 2019-04-30 (×5): 20 mg via ORAL
  Filled 2019-04-26 (×5): qty 1

## 2019-04-26 NOTE — Progress Notes (Signed)
PROGRESS NOTE   Jessica [name]  EHM:[id number]    DOB: 07-Oct-1946    DOA: 04/17/2019  PCP: Cari Caraway, MD   I have briefly reviewed patients previous medical records in [facility].  Chief Complaint  Patient presents with  . Fall     Brief Narrative:  73 year old female with PMH of DM 2, diastolic dysfunction, HLD, HTN, diabetic peripheral neuropathy, obesity, OSA on CPAP, presented on 6/23 following an unwitnessed mechanical fall in the bathroom with associated acute displaced right distal femur fracture and nondisplaced left ankle fracture.  S/p right femur ORIF on 6/25 with incisional VAC placement and splint/supportive care of left ankle fracture.  Patient has been stable for a couple days and awaiting insurance approval for SNF placement.   Assessment & Plan:   Principal Problem:   Closed right femoral fracture (HCC) Active Problems:   Morbid obesity ([place])   Type 2 diabetes mellitus ([place])   Pure hypercholesterolemia   Essential hypertension, benign   Sleep apnea   Glaucoma   Displaced supracondylar fracture of distal end of right femur without intracondylar extension ([place])   Closed right distal femur fracture:s/pORIF on 6/25 by Orthopedics.  PT OT has recommended skilled nursing facility, not a candidate for CIR (requested by patient). Waiting for bed offers for SNF once clearance. Cont lovenox for prophylaxis started 6/26.  COVID-19 testing now negative in preparation of SNF admission. Orthopedic follow-up 7/2 appreciated, wound VAC removed, nonweightbearing on both lower extremities.  Constipation: Continue bowel regimen, having BMs now.  Yeast infection: Treated with a dose of Diflucan, resolved.  Nondisplaced left ankle fracture:Continue non-weight bearing bilaterally per ortho, continue brace continue.   Continue pain control.  T2DM:A1c 6.9%. Remains on home insulin infusion and reasonable inpatient control.    Essential hypertension.  Controlled on home losartan and lopressor. Heldhome lasix and spirinolactone prior toprocedure and both resumed postoperatively.  Asymptomatic E. coli bacteriuria  HLD: Continue home home zocor  CHF with preserved EF: Compensated. Cont aldactone and Lasix  OSA.CPAP nightly   Morbid obesity with BMI 48, advised weight loss, healthy lifestyle   COVID 19-1 6/23.  Repeat COVID-19 testing from 6/29: Negative  Stage II mid sacral ulcer: Linear, clean and without acute findings   DVT prophylaxis: Lovenox Code Status: Full Family Communication: None at bedside Disposition: DC to SNF pending bed availability and insurance approval.   Consultants:  Orthopedic  Procedures:  ORIF of right distal femur fracture 6/25  Antimicrobials:  Ancef 6/26 x 1 Diflucan 6/27 x 1   Subjective: Patient seen along with RN this morning.  Patient reported that her wound VAC was supposed to come off today.  Intermittent mild to moderate right lower extremity postop pain and left leg pain.  No other complaints reported.  ROS: As above, otherwise negative  Objective:  Vitals:   04/25/19 2200 04/26/19 0458 04/26/19 0459 04/26/19 1114  BP:  (!) 118/46 (!) 118/46 (!) 148/85  Pulse: 85 75 73 97  Resp: 16  (!) 24 (!) 21  Temp:  98.4 F (36.9 C) 98.4 F (36.9 C) 97.9 F (36.6 C)  TempSrc:  Oral Oral Oral  SpO2: 97% 98% 97% 97%  Weight:      Height:        Examination:  General exam: Pleasant elderly female, moderately built and morbidly obese lying comfortably propped up in bed without distress Respiratory system: Clear to auscultation.  No increased work of breathing Cardiovascular system: S1 & S2 heard, RRR. No  JVD, murmurs, rubs, gallops or clicks. No pedal edema.  Stable Gastrointestinal system: Abdomen is nondistended, soft and nontender. No organomegaly or masses felt. Normal bowel sounds heard.  Stable Central nervous system: Alert and oriented. No focal neurological deficits.  Extremities: Symmetric 5 x 5 power.  Bilateral lower extremity power assessment limited secondary to pain from surgery and fracture.  Right thigh postop dressing clean and dry.  Mild ecchymosis over right lateral groin.  Left leg and wrapped.  Stable without change Skin: Small stage II linear mid gluteal cleft ulcer without acute findings as examined on 7/1. Psychiatry: Judgement and insight appear normal. Mood & affect appropriate.     Data Reviewed: I have personally reviewed following labs and imaging studies  CBC: Recent Labs  Lab 04/19/19 1750 04/20/19 0414 04/21/19 0439 04/22/19 0446  WBC 15.0* 13.9* 11.3* 9.6  HGB 11.2* 10.6* 9.3* 9.0*  HCT 35.2* 34.2* 28.8* 29.0*  MCV 88.7 88.4 88.1 88.7  PLT 172 174 173 696   Basic Metabolic Panel: Recent Labs  Lab 04/19/19 1750 04/20/19 0414 04/21/19 0439 04/26/19 0455  NA  --  138 139  --   K  --  4.3 4.0  --   CL  --  104 104  --   CO2  --  23 25  --   GLUCOSE  --  319* 138*  --   BUN  --  32* 36*  --   CREATININE 0.79 0.74 0.65 0.64  CALCIUM  --  8.7* 8.5*  --    Liver Function Tests: No results for input(s): AST, ALT, ALKPHOS, BILITOT, PROT, ALBUMIN in the last 168 hours. Coagulation Profile: No results for input(s): INR, PROTIME in the last 168 hours. Cardiac Enzymes: No results for input(s): CKTOTAL, CKMB, CKMBINDEX, TROPONINI in the last 168 hours. HbA1C: No results for input(s): HGBA1C in the last 72 hours. CBG: Recent Labs  Lab 04/25/19 2108 04/26/19 0306 04/26/19 0841 04/26/19 1203 04/26/19 1448  GLUCAP 113* 111* 142* 110* 151*    Recent Results (from the past 240 hour(s))  SARS Coronavirus 2 (CEPHEID - Performed in [facility] hospital lab), Hosp Order     Status: None   Collection Time: 04/17/19  4:15 PM   Specimen: Nasopharyngeal Swab  Result Value Ref Range Status   SARS Coronavirus 2 NEGATIVE NEGATIVE Final    Comment: (NOTE) If result is NEGATIVE SARS-CoV-2 target nucleic acids are NOT  DETECTED. The SARS-CoV-2 RNA is generally detectable in upper and lower  respiratory specimens during the acute phase of infection. The lowest  concentration of SARS-CoV-2 viral copies this assay can detect is 250  copies / mL. A negative result does not preclude SARS-CoV-2 infection  and should not be used as the sole basis for treatment or other  patient management decisions.  A negative result may occur with  improper specimen collection / handling, submission of specimen other  than nasopharyngeal swab, presence of viral mutation(s) within the  areas targeted by this assay, and inadequate number of viral copies  (<250 copies / mL). A negative result must be combined with clinical  observations, patient history, and epidemiological information. If result is POSITIVE SARS-CoV-2 target nucleic acids are DETECTED. The SARS-CoV-2 RNA is generally detectable in upper and lower  respiratory specimens dur ing the acute phase of infection.  Positive  results are indicative of active infection with SARS-CoV-2.  Clinical  correlation with patient history and other diagnostic information is  necessary to determine patient infection status.  Positive results do  not rule out bacterial infection or co-infection with other viruses. If result is PRESUMPTIVE POSTIVE SARS-CoV-2 nucleic acids MAY BE PRESENT.   A presumptive positive result was obtained on the submitted specimen  and confirmed on repeat testing.  While 2019 novel coronavirus  (SARS-CoV-2) nucleic acids may be present in the submitted sample  additional confirmatory testing may be necessary for epidemiological  and / or clinical management purposes  to differentiate between  SARS-CoV-2 and other Sarbecovirus currently known to infect humans.  If clinically indicated additional testing with an alternate test  methodology [phone]) is advised. The SARS-CoV-2 RNA is generally  detectable in upper and lower respiratory sp ecimens during  the acute  phase of infection. The expected result is Negative. Fact Sheet for Patients:  StrictlyIdeas.no Fact Sheet for Healthcare Providers: BankingDealers.co.za This test is not yet approved or cleared by the [place] FDA and has been authorized for detection and/or diagnosis of SARS-CoV-2 by FDA under an Emergency Use Authorization (EUA).  This EUA will remain in effect (meaning this test can be used) for the duration of the COVID-19 declaration under Section 564(b)(1) of the Act, 21 U.S.C. section 360bbb-3(b)(1), unless the authorization is terminated or revoked sooner. Performed at [facility], [place] [address], [place], [place] [address]   Surgical PCR screen     Status: None   Collection Time: 04/17/19 10:33 PM   Specimen: Nasal Mucosa; Nasal Swab  Result Value Ref Range Status   MRSA, PCR NEGATIVE NEGATIVE Final   Staphylococcus aureus NEGATIVE NEGATIVE Final    Comment: (NOTE) The Xpert SA Assay (FDA approved for NASAL specimens in patients 84 years of age and older), is one component of a comprehensive surveillance program. It is not intended to diagnose infection nor to guide or monitor treatment. Performed at [facility], [place] [address], [place], [place] [address]   Urine Culture     Status: Abnormal   Collection Time: 04/19/19 12:53 PM   Specimen: Urine, Catheterized  Result Value Ref Range Status   Specimen Description   Final    URINE, CATHETERIZED Performed at [place] [address], [place], [place] [address]    Special Requests   Final    NONE Performed at [facility], [place] [address] [place], [place] [address]    Culture >=100,000 COLONIES/mL ESCHERICHIA COLI (A)  Final   Report Status 04/21/2019 FINAL  Final   Organism ID, Bacteria ESCHERICHIA COLI (A)  Final      Susceptibility   Escherichia coli - MIC*    AMPICILLIN <=2  SENSITIVE Sensitive     CEFAZOLIN <=4 SENSITIVE Sensitive     CEFTRIAXONE <=1 SENSITIVE Sensitive     CIPROFLOXACIN <=0.25 SENSITIVE Sensitive     GENTAMICIN <=1 SENSITIVE Sensitive     IMIPENEM <=0.25 SENSITIVE Sensitive     NITROFURANTOIN <=16 SENSITIVE Sensitive     TRIMETH/SULFA <=20 SENSITIVE Sensitive     AMPICILLIN/SULBACTAM <=2 SENSITIVE Sensitive     PIP/TAZO <=4 SENSITIVE Sensitive     Extended ESBL NEGATIVE Sensitive     * >=100,000 COLONIES/mL ESCHERICHIA COLI  Novel Coronavirus, NAA (hospital order; send-out to ref lab)     Status: None   Collection Time: 04/23/19  5:51 PM   Specimen: Nasopharyngeal Swab; Respiratory  Result Value Ref Range Status   SARS-CoV-2, NAA NOT DETECTED NOT DETECTED Final    Comment: (NOTE) This test was developed and its performance characteristics  determined by [employer]. This test has not been FDA cleared or approved. This test has been authorized by FDA under an Emergency Use Authorization (EUA). This test is only authorized for the duration of time the declaration that circumstances exist justifying the authorization of the emergency use of in vitro diagnostic tests for detection of SARS-CoV-2 virus and/or diagnosis of COVID-19 infection under section 564(b)(1) of the Act, 21 U.S.C. 619JKD-3(O)(6), unless the authorization is terminated or revoked sooner. When diagnostic testing is negative, the possibility of a false negative result should be considered in the context of a patient's recent exposures and the presence of clinical signs and symptoms consistent with COVID-19. An individual without symptoms of COVID-19 and who is not shedding SARS-CoV-2 virus would expect to have a negative (not detected) result in this assay. Performed  At: [facility] [place], [place] [id number] Rush Farmer MD IP:[phone]    [place]  Final    Comment: Performed at [place] [address] [place], [place] [address]         Radiology Studies: No results found.      Scheduled Meds: . cholecalciferol  2,000 Units Oral Daily  . docusate sodium  100 mg Oral BID  . dorzolamide-timolol  1 drop Both Eyes BID  . enoxaparin (LOVENOX) injection  40 mg Subcutaneous Q24H  . feeding supplement (PRO-STAT SUGAR FREE 64)  30 mL Oral Daily  . insulin pump   Subcutaneous TID AC, HS, 0200  . latanoprost  1 drop Both Eyes QHS  . losartan  100 mg Oral Daily  . metoprolol tartrate  100 mg Oral BID  . multivitamin with minerals  1 tablet Oral Daily  . polyethylene glycol  17 g Oral Daily  . Ensure Max Protein  11 oz Oral Daily  . senna  1 tablet Oral BID  . simvastatin  40 mg Oral QPM  . spironolactone  25 mg Oral Daily   Continuous Infusions:   LOS: 9 days     Vernell Leep, MD, FACP, [facility]. [facility]  To contact the attending provider between 7A-7P or the covering provider during after hours 7P-7A, please log into the web site [email or web address] and access using universal [employer] password for that web site. If you do not have the password, please call the hospital operator.  04/26/2019, 3:51 PM

## 2019-04-26 NOTE — Plan of Care (Signed)
  Problem: Education: Goal: Knowledge of General Education information will improve Description: Including pain rating scale, medication(s)/side effects and non-pharmacologic comfort measures Outcome: Progressing   Problem: Activity: Goal: Risk for activity intolerance will decrease Outcome: Progressing   Problem: Pain Managment: Goal: General experience of comfort will improve Outcome: Progressing   

## 2019-04-26 NOTE — Care Management Important Message (Signed)
Important Message  Patient Details IM Letter given to [name] SW to present to the Patient Name: Jessica [name] MRN: [id number] Date of Birth: [birth year]   Medicare Important Message Given:  Yes     Kerin Salen 04/26/2019, 11:01 AM

## 2019-04-26 NOTE — Progress Notes (Signed)
Physical Therapy Treatment Patient Details Name: Jessica [name] MRN: [id number] DOB: 30-Jun-1946 Today's Date: 04/26/2019    History of Present Illness Pt is a 73 year old female admitted for fractures after mechanical fall.  Pt sustained an acute displaced right distal femur fracture and s/p ORIF on 6/25 and Nondisplaced left ankle fracture    PT Comments    Pt assisted off bed pan and then agreeable to mobilize.  Pt assisted to sitting EOB.  Pt requires UE support while sitting however no external support required.  Pt pleased to have NPWT removed today.  Follow Up Recommendations  SNF     Equipment Recommendations  None recommended by PT    Recommendations for Other Services       Precautions / Restrictions Precautions Precautions: Fall Restrictions Weight Bearing Restrictions: Yes RLE Weight Bearing: Non weight bearing LLE Weight Bearing: Non weight bearing    Mobility  Bed Mobility Overal bed mobility: Needs Assistance Bed Mobility: Supine to Sit;Sit to Supine Rolling: Mod assist;+2 for physical assistance   Supine to sit: Max assist;+2 for physical assistance;HOB elevated Sit to supine: Total assist;+2 for physical assistance   General bed mobility comments: pt initiated movement however required assist to complete, verbal cues for technique and self assist (rolling performed to assist pt off bed pan)  Transfers                 General transfer comment: pt unable to weight shift well so did not initiate transfer training  Ambulation/Gait                 Stairs             Wheelchair Mobility    Modified Rankin (Stroke Patients Only)       Balance                                            Cognition Arousal/Alertness: Awake/alert Behavior During Therapy: WFL for tasks assessed/performed Overall Cognitive Status: Within Functional Limits for tasks assessed                                         Exercises Total Joint Exercises Ankle Circles/Pumps: AROM;10 reps;Right Long Arc Quad: AROM;Left;10 reps    General Comments        Pertinent Vitals/Pain Pain Assessment: Faces Faces Pain Scale: Hurts little more Pain Location: R leg with assist/movement Pain Descriptors / Indicators: Sore Pain Intervention(s): Repositioned;Monitored during session    Home Living                      Prior Function            PT Goals (current goals can now be found in the care plan section) Progress towards PT goals: Progressing toward goals    Frequency    Min 2X/week      PT Plan Current plan remains appropriate    Co-evaluation              AM-PAC PT "6 Clicks" Mobility   Outcome Measure  Help needed turning from your back to your side while in a flat bed without using bedrails?: Total Help needed moving from lying on your back to sitting on the side of a  flat bed without using bedrails?: Total Help needed moving to and from a bed to a chair (including a wheelchair)?: Total Help needed standing up from a chair using your arms (e.g., wheelchair or bedside chair)?: Total Help needed to walk in hospital room?: Total Help needed climbing 3-5 steps with a railing? : Total 6 Click Score: 6    End of Session Equipment Utilized During Treatment: Other (comment) Activity Tolerance: Patient limited by fatigue Patient left: in bed;with call bell/phone within reach;with bed alarm set   PT Visit Diagnosis: Other abnormalities of gait and mobility (R26.89)     Time: 6415-8309 PT Time Calculation (min) (ACUTE ONLY): 27 min  Charges:  $Therapeutic Activity: 23-37 mins                    Carmelia Bake, PT, DPT Acute Rehabilitation Services Office: [phone] Pager: [place] E 04/26/2019, 1:31 PM

## 2019-04-26 NOTE — Progress Notes (Signed)
    Subjective:  Patient reports pain as mild to moderate.  Denies N/V/CP/SOB. No c/o.  Objective:   VITALS:   Vitals:   04/25/19 1246 04/25/19 2200 04/26/19 0458 04/26/19 0459  BP: (!) 116/57  (!) 118/46 (!) 118/46  Pulse: 70 85 75 73  Resp: 16 16  (!) 24  Temp: 99.2 F (37.3 C)  98.4 F (36.9 C) 98.4 F (36.9 C)  TempSrc: Oral  Oral Oral  SpO2: 97% 97% 98% 97%  Weight:      Height:        NAD  RLE:  iVAC intact - VAC removed: incis c/d/i (+) TA, GS, EHL SILT 2+ DP  LLE: Splint intact Wiggles toes BCR, SILT toes  Lab Results  Component Value Date   WBC 9.6 04/22/2019   HGB 9.0 (L) 04/22/2019   HCT 29.0 (L) 04/22/2019   MCV 88.7 04/22/2019   PLT 207 04/22/2019   BMET    Component Value Date/Time   NA 139 04/21/2019 0439   K 4.0 04/21/2019 0439   CL 104 04/21/2019 0439   CO2 25 04/21/2019 0439   GLUCOSE 138 (H) 04/21/2019 0439   BUN 36 (H) 04/21/2019 0439   CREATININE 0.64 04/26/2019 0455   CALCIUM 8.5 (L) 04/21/2019 0439   GFRNONAA >60 04/26/2019 0455   GFRAA >60 04/26/2019 0455     Assessment/Plan: 7 Days Post-Op   Principal Problem:   Closed right femoral fracture (HCC) Active Problems:   Morbid obesity ([place])   Type 2 diabetes mellitus ([place])   Pure hypercholesterolemia   Essential hypertension, benign   Sleep apnea   Glaucoma   Displaced supracondylar fracture of distal end of right femur without intracondylar extension ([place])   NWB BLE DVT ppx: Lovenox, SCDs, TEDS PO pain control PT/OT Dispo: D/C planning, daily dry dressing change   Jessica [name] 04/26/2019, 10:37 AM   Rod Can, MD Cell: [phone] [place] [facility] now [facility]  [place] [address], [place], [place], [place] [address] Phone: [phone] [email or web address] Facebook  [employer]

## 2019-04-27 LAB — GLUCOSE, CAPILLARY
Glucose-Capillary: 134 mg/dL — ABNORMAL HIGH (ref 70–99)
Glucose-Capillary: 112 mg/dL — ABNORMAL HIGH (ref 70–99)
Glucose-Capillary: 127 mg/dL — ABNORMAL HIGH (ref 70–99)
Glucose-Capillary: 149 mg/dL — ABNORMAL HIGH (ref 70–99)
Glucose-Capillary: 104 mg/dL — ABNORMAL HIGH (ref 70–99)

## 2019-04-27 MED ORDER — SENNA 8.6 MG PO TABS: 1.0000 | ORAL_TABLET | Freq: Two times a day (BID) | ORAL | Status: DC

## 2019-04-27 MED ORDER — POLYETHYLENE GLYCOL 3350 17 G PO PACK: 17.0000 g | PACK | Freq: Every day | ORAL | Status: DC

## 2019-04-27 MED ORDER — ACETAMINOPHEN 325 MG PO TABS: 650.0000 mg | ORAL_TABLET | Freq: Four times a day (QID) | ORAL | Status: DC | PRN

## 2019-04-27 MED ORDER — PRO-STAT SUGAR FREE PO LIQD: 30.0000 mL | Freq: Every day | ORAL | Status: DC

## 2019-04-27 MED ORDER — ENSURE MAX PROTEIN PO LIQD: 11.0000 [oz_av] | Freq: Every day | ORAL | Status: DC

## 2019-04-27 MED ORDER — DOCUSATE SODIUM 100 MG PO CAPS: 100.0000 mg | ORAL_CAPSULE | Freq: Two times a day (BID) | ORAL | Status: DC

## 2019-04-27 NOTE — Discharge Summary (Addendum)
Physician Discharge Summary  Jessica [name] DOB: 09/03/1946  PCP: Cari Caraway, MD  Admit date: 04/17/2019 Discharge date: 04/30/2019  Patient was supposed to discharge to SNF on 04/27/2019.  However due to SNF's inability to arrange for CPAP, this was delayed and patient was discharged to SNF on 04/30/2019.  Recommendations for Outpatient Follow-up:  1. MD at SNF in 3 days with repeat labs (CBC & BMP). 2. Dr. Rod Can, Orthopedics in 10 days for postop follow-up and staples removal. 3. Dr. Cari Caraway, PCP upon discharge from SNF. 4. CPAP at bedtime 5. Monitor CBGs closely 3 times daily before meals and at bedtime.  Patient is on insulin pump.  Home Health: Patient being discharged to SNF. Equipment/Devices: TBD at SNF.  Discharge Condition: Improved and stable CODE STATUS: Full. Diet recommendation: Heart healthy & diabetic diet.  Discharge Diagnoses:  Principal Problem:   Closed right femoral fracture (HCC) Active Problems:   Morbid obesity ([place])   Type 2 diabetes mellitus ([place])   Pure hypercholesterolemia   Essential hypertension, benign   Sleep apnea   Glaucoma   Displaced supracondylar fracture of distal end of right femur without intracondylar extension [facility])   Brief Summary: 73 year old female with PMH of DM 2 on insulin pump, diastolic dysfunction, HLD, HTN, diabetic peripheral neuropathy, obesity, OSA on CPAP, presented on 6/23 following an unwitnessed mechanical fall in the bathroom with associated acute displaced right distal femur fracture and nondisplaced left ankle fracture.  S/p right femur ORIF on 6/25 with incisional VAC placement and splint/supportive care of left ankle fracture.    Assessment & Plan:  Closed right distal femur fracture:s/pORIF on 6/25 by Orthopedics. PT OT has recommended skilled nursing facility, not a candidate for CIR (requested by patient). Cont lovenox for prophylaxis started 6/26. Orthopedic follow-up 7/2  appreciated, wound VAC removed, nonweightbearing on both lower extremities. I discussed with Dr. Lyla Glassing on 7/3 who recommends outpatient follow-up with him in 10 days for staples removal.  Patient has not utilized opioid pain medications for at least the past week.  Constipation: Continue bowel regimen, having BMs now.  Yeast infection: Treated with a dose of Diflucan, resolved.  Nondisplaced left ankle fracture:Continue non-weight bearing bilaterally per ortho, continue brace. Pain controlled.  T2DM:A1c 6.9%. Continue PTA insulin pump, metformin and Invokana.  Monitor CBGs closely.  Follows with outpatient Endocrinology.  Essential hypertension. Continue PTA meds including losartan, Lopressor, Lasix and spironolactone.  Controlled.   Asymptomatic E. coli bacteriuria  HLD: Continue home home zocor  CHF with preserved EF: Compensated. Cont aldactone and Lasix  OSA.CPAP nightly and tolerating same.  Morbid obesity with BMI 48,advised weight loss, healthy lifestyle   Stage II mid sacral ulcer: Linear, clean and without acute findings  Anemia Presented with hemoglobin of 13.4.  Anemia likely due to acute blood loss from fracture and related surgery.  Hemoglobin has been stable in the 9 g range  Consultants:  Orthopedic  Procedures:  ORIF of right distal femur fracture 6/25   Discharge Instructions  Discharge Instructions    (HEART FAILURE PATIENTS) Call MD:  Anytime you have any of the following symptoms: 1) 3 pound weight gain in 24 hours or 5 pounds in 1 week 2) shortness of breath, with or without a dry hacking cough 3) swelling in the hands, feet or stomach 4) if you have to sleep on extra pillows at night in order to breathe.   Complete by: As directed    Call MD for:  difficulty breathing,  headache or visual disturbances   Complete by: As directed    Call MD for:  extreme fatigue   Complete by: As directed    Call MD for:  persistant dizziness or  light-headedness   Complete by: As directed    Call MD for:  persistant nausea and vomiting   Complete by: As directed    Call MD for:  redness, tenderness, or signs of infection (pain, swelling, redness, odor or green/yellow discharge around incision site)   Complete by: As directed    Call MD for:  severe uncontrolled pain   Complete by: As directed    Call MD for:  temperature >100.4   Complete by: As directed    Diet - low sodium heart healthy   Complete by: As directed    Diet Carb Modified   Complete by: As directed    Discharge instructions   Complete by: As directed    1) CPAP at bedtime as per prior home settings. 2) Nonweightbearing on bilateral lower extremities until cleared by orthopedics during outpatient follow-up.   Increase activity slowly   Complete by: As directed        Medication List    STOP taking these medications   diclofenac sodium 1 % Gel Commonly known as: Voltaren   ibuprofen 600 MG tablet Commonly known as: ADVIL     TAKE these medications   acetaminophen 325 MG tablet Commonly known as: Tylenol Take 2 tablets (650 mg total) by mouth every 6 (six) hours as needed for mild pain. Do not take more than 4000mg  of tylenol per day   B-D INS SYRINGE 0.5CC/31GX5/16 31G X 5/16" 0.5 ML Misc Generic drug: Insulin Syringe-Needle U-100 Use as directed per provider.   Co Q-10 100 MG Caps Take 100 mg by mouth at bedtime.   docusate sodium 100 MG capsule Commonly known as: COLACE Take 1 capsule (100 mg total) by mouth 2 (two) times daily.   dorzolamide-timolol 22.3-6.8 MG/ML ophthalmic solution Commonly known as: COSOPT Place 1 drop into both eyes 2 (two) times daily.   enoxaparin 40 MG/0.4ML injection Commonly known as: LOVENOX Inject 0.4 mLs (40 mg total) into the skin daily.   Ensure Max Protein Liqd Take 330 mLs (11 oz total) by mouth daily.   feeding supplement (PRO-STAT SUGAR FREE 64) Liqd Take 30 mLs by mouth daily.   furosemide 20  MG tablet Commonly known as: LASIX Take 1 tablet (20 mg total) by mouth daily.   Garlic 6948 MG Caps Take 1,000 mg by mouth daily.   HYDROmorphone 2 MG tablet Commonly known as: DILAUDID Take 0.5 tablets (1 mg total) by mouth every 4 (four) hours as needed for severe pain.   insulin lispro 100 UNIT/ML injection Commonly known as: HUMALOG Inject into the skin daily. Insulin pump   Invokana 300 MG Tabs tablet Generic drug: canagliflozin Take 300 mg by mouth daily.   losartan 100 MG tablet Commonly known as: COZAAR TAKE 1 TABLET DAILY   metFORMIN 500 MG 24 hr tablet Commonly known as: GLUCOPHAGE-XR Take 500 mg by mouth at bedtime.   metoprolol tartrate 100 MG tablet Commonly known as: LOPRESSOR TAKE 1 TABLET TWICE A DAY   ONE TOUCH ULTRA TEST test strip Generic drug: glucose blood 1 each by Other route as needed (Use as directed per provider).   polyethylene glycol 17 g packet Commonly known as: MIRALAX / GLYCOLAX Take 17 g by mouth daily.   PRESERVISION AREDS 2 PO Take 1 tablet  by mouth 2 (two) times daily.   senna 8.6 MG Tabs tablet Commonly known as: SENOKOT Take 1 tablet (8.6 mg total) by mouth 2 (two) times daily.   simvastatin 40 MG tablet Commonly known as: ZOCOR Take 40 mg by mouth every evening.   spironolactone 25 MG tablet Commonly known as: ALDACTONE Take 1 tablet (25 mg total) by mouth daily.   Travoprost (BAK Free) 0.004 % Soln ophthalmic solution Commonly known as: TRAVATAN Place 1 drop into both eyes at bedtime.   Vitamin D (Ergocalciferol) 50 MCG (2000 UT) Caps Take 2,000 Units by mouth daily.   Vitron-C 65-125 MG Tabs Generic drug: Iron-Vitamin C Take 1 tablet by mouth at bedtime.       Contact information for follow-up providers    MD at SNF. Schedule an appointment as soon as possible for a visit in 3 day(s).   Why: To be seen with repeat labs (CBC & BMP).       Cari Caraway, MD. Schedule an appointment as soon as possible  for a visit.   Specialty: Family Medicine Why: Upon discharge from SNF. Contact information: [place] [address] [phone]        Rod Can, MD. Schedule an appointment as soon as possible for a visit in 10 day(s).   Specialty: Orthopedic Surgery Why: Postop follow-up and staples removal. Contact information: [address] [place] [address] [phone]            Contact information for after-discharge care    [facility] .   Service: Skilled Nursing Contact information: [address] [address] [phone]                 Allergies  Allergen Reactions  . Meloxicam     Having bleeding  . Percocet [Oxycodone-Acetaminophen] Other (See Comments)    Headache  . Tramadol   . Vicodin [Hydrocodone-Acetaminophen] Other (See Comments)    Headache   . Azithromycin Rash  . Iodine Rash      Procedures/Studies: Dg Ankle Complete Left  Result Date: 04/17/2019 CLINICAL DATA:  73 year old female slipped and fell on wet bathroom today. Pain. EXAM: LEFT ANKLE COMPLETE - 3+ VIEW COMPARISON:  None. FINDINGS: Oblique nondisplaced fracture of the distal left fibula metadiaphysis best demonstrated on image 2. This appears to spare the lateral malleolus where 1 or more small chronic appearing ossific fragments are noted. Generalized soft tissue swelling. Chronic appearing fragment at the medial malleolus. Preserved mortise joint alignment. Talar dome intact. Possible joint effusion. The calcaneus and visible bones of the left foot appear intact. IMPRESSION: 1. Oblique nondisplaced fracture of the distal left fibula metadiaphysis. 2. Superimposed chronic appearing fracture fragments at both the medial and lateral malleolus. No other acute fracture or dislocation identified about the left ankle. Electronically Signed   By: Genevie Ann M.D.   On: 04/17/2019 15:52   Ct  Ankle Left Wo Contrast  Result Date: 04/17/2019 CLINICAL DATA:  Evaluate ankle fracture. EXAM: CT OF THE LEFT ANKLE WITHOUT CONTRAST TECHNIQUE: Multidetector CT imaging of the left ankle was performed according to the standard protocol. Multiplanar CT image reconstructions were also generated. COMPARISON:  Radiographs, same date. FINDINGS: There is a nondisplaced oblique fracture of the distal fibular shaft at and above the level of the ankle mortise. There is also mild lateral mortise widening which may suggest a ligamentous injury. No fracture of the tibia is  identified. Remote avulsion type fractures along the distal tip of the medial malleolus. The talus is intact. The subtalar joints are maintained. Mild degenerative changes. No midfoot or hindfoot fractures are identified. Moderate midfoot degenerative changes. Calcaneal spurring changes are noted. IMPRESSION: 1. Nondisplaced fracture involving the distal fibular shaft. 2. No fracture of the tibia or talus. 3. Mild lateral ankle mortise widening may suggest a ligamentous injury. Electronically Signed   By: Marijo Sanes M.D.   On: 04/17/2019 18:48   Dg Chest Port 1 View  Result Date: 04/29/2019 CLINICAL DATA:  Status post fall today. EXAM: PORTABLE CHEST 1 VIEW COMPARISON:  April 17, 2019 FINDINGS: The heart size and mediastinal contours are within normal limits. Both lungs are clear. The visualized skeletal structures are unremarkable. IMPRESSION: No active cardiopulmonary disease. Electronically Signed   By: Abelardo Diesel M.D.   On: 04/29/2019 16:44   Chest Portable 1 View  Result Date: 04/17/2019 CLINICAL DATA:  Hypertension EXAM: PORTABLE CHEST 1 VIEW COMPARISON:  None. FINDINGS: The heart size is mildly enlarged. There is no acute osseous abnormality. No pneumothorax. No large pleural effusion. No significant area of consolidation. IMPRESSION: No active disease. Electronically Signed   By: Constance Holster M.D.   On: 04/17/2019 19:37   Dg  C-arm 1-60 Min-no Report  Result Date: 04/19/2019 Fluoroscopy was utilized by the requesting physician.  No radiographic interpretation.   Dg Hip Unilat  With Pelvis 2-3 Views Right  Result Date: 04/17/2019 CLINICAL DATA:  73 year old female slipped and fell on wet bathroom today. Pain. EXAM: DG HIP (WITH OR WITHOUT PELVIS) 2-3V RIGHT COMPARISON:  Right hip series 12/19/2017. FINDINGS: Femoral heads remain normally located. The pelvis appears stable and intact. Grossly intact proximal left femur. The proximal right femur appears stable and intact. Chronic pelvic phleboliths. Negative visible bowel gas pattern. IMPRESSION: No acute fracture or dislocation identified about the right hip or pelvis. Electronically Signed   By: Genevie Ann M.D.   On: 04/17/2019 15:50   Dg Femur, Min 2 Views Right  Result Date: 04/19/2019 CLINICAL DATA:  Femur fracture EXAM: RIGHT FEMUR 2 VIEWS COMPARISON:  04/17/2019 FINDINGS: Four low resolution intraoperative spot views of the right femur. Total fluoroscopy time was 1 minutes 25 seconds. Prior right knee replacement. Surgical plate and screw fixation of the femur for comminuted distal femoral fracture. Placement of cerclage wires at the distal femur. Anatomic alignment is noted. IMPRESSION: Intraoperative fluoroscopic assistance provided during internal fixation of distal femoral fracture Electronically Signed   By: Donavan Foil M.D.   On: 04/19/2019 16:07   Dg Femur Min 2 Views Right  Result Date: 04/17/2019 CLINICAL DATA:  Fall. EXAM: RIGHT FEMUR 2 VIEWS COMPARISON:  12/19/2017 FINDINGS: Right knee replacement. Spiral fracture distal femur above the knee prosthesis. Displacement and angulation. No other femur fracture.  Right hip intact. IMPRESSION: Spiral fracture distal femur with displacement and angulation. Right knee replacement. Electronically Signed   By: Franchot Gallo M.D.   On: 04/17/2019 16:56   Dg Femur Port, Min 2 Views Right  Result Date:  04/19/2019 CLINICAL DATA:  Postop EXAM: RIGHT FEMUR PORTABLE 2 VIEW COMPARISON:  04/17/2019 FINDINGS: Lateral cutaneous staples. Interval surgical plate, screw and cerclage wire fixation of the femur from the proximal shaft to the knee replacement. This bridges acute comminuted distal femoral fracture. Reduction of fracture displacement, now with anatomic alignment. Prior right knee replacement. Gas in the soft tissues consistent with recent surgery. IMPRESSION: Interval internal fixation of comminuted distal femoral fracture,  now with anatomic alignment. Expected postsurgical changes Electronically Signed   By: Donavan Foil M.D.   On: 04/19/2019 19:31      Subjective: Patient awaiting to go to SNF.  No pain reported.  Frustrated and anxious regarding her inability to do anything for herself, ongoing pandemic restrictions and family unable to assist her but patient trying to maintain good spirits.  Reassured her.  Discharge Exam:  Vitals:   04/29/19 2108 04/29/19 2108 04/30/19 0454 04/30/19 1029  BP:  (!) 135/53 (!) 113/36 (!) 147/61  Pulse:  86 67 98  Resp:  20 (!) 22   Temp: 98.1 F (36.7 C)  98.2 F (36.8 C)   TempSrc: Oral     SpO2:  98% 98%   Weight:      Height:        General exam: Pleasant elderly female, moderately built and morbidly obese lying comfortably propped up in bed without distress Respiratory system: Clear to auscultation.  No increased work of breathing Cardiovascular system: S1 & S2 heard, RRR. No JVD, murmurs, rubs, gallops or clicks. No pedal edema.   Gastrointestinal system: Abdomen is nondistended, soft and nontender. No organomegaly or masses felt. Normal bowel sounds heard.   Central nervous system: Alert and oriented. No focal neurological deficits. Extremities: Symmetric 5 x 5 power in upper extremities.  Bilateral lower extremity power assessment limited secondary to pain from surgery and fracture.  Right thigh postop dressing clean and dry.  Mild  ecchymosis over right lateral groin without worsening.  Left leg in splint and wrapped.   Skin: Small stage II linear mid gluteal cleft ulcer without acute findings as examined on 7/1. Psychiatry: Judgement and insight appear normal. Mood & affect appropriate.     The results of significant diagnostics from this hospitalization (including imaging, microbiology, ancillary and laboratory) are listed below for reference.     Microbiology: Recent Results (from the past 240 hour(s))  Novel Coronavirus, NAA (hospital order; send-out to ref lab)     Status: None   Collection Time: 04/23/19  5:51 PM   Specimen: Nasopharyngeal Swab; Respiratory  Result Value Ref Range Status   SARS-CoV-2, NAA NOT DETECTED NOT DETECTED Final    Comment: (NOTE) This test was developed and its performance characteristics determined by [employer]. This test has not been FDA cleared or approved. This test has been authorized by FDA under an Emergency Use Authorization (EUA). This test is only authorized for the duration of time the declaration that circumstances exist justifying the authorization of the emergency use of in vitro diagnostic tests for detection of SARS-CoV-2 virus and/or diagnosis of COVID-19 infection under section 564(b)(1) of the Act, 21 U.S.C. 443XVQ-0(G)(8), unless the authorization is terminated or revoked sooner. When diagnostic testing is negative, the possibility of a false negative result should be considered in the context of a patient's recent exposures and the presence of clinical signs and symptoms consistent with COVID-19. An individual without symptoms of COVID-19 and who is not shedding SARS-CoV-2 virus would expect to have a negative (not detected) result in this assay. Performed  At: [facility] [place], [place] [id number] Rush Farmer MD OI:[id number]    [place]  Final    Comment: Performed at [facility] [address] [place], [place] [address]  SARS Coronavirus 2 (CEPHEID - Performed in [facility] hospital lab), Hosp Order     Status: None   Collection Time: 04/29/19  4:09 PM  Specimen: Nasopharyngeal Swab  Result Value Ref Range Status   SARS Coronavirus 2 NEGATIVE NEGATIVE Final    Comment: (NOTE) If result is NEGATIVE SARS-CoV-2 target nucleic acids are NOT DETECTED. The SARS-CoV-2 RNA is generally detectable in upper and lower  respiratory specimens during the acute phase of infection. The lowest  concentration of SARS-CoV-2 viral copies this assay can detect is 250  copies / mL. A negative result does not preclude SARS-CoV-2 infection  and should not be used as the sole basis for treatment or other  patient management decisions.  A negative result may occur with  improper specimen collection / handling, submission of specimen other  than nasopharyngeal swab, presence of viral mutation(s) within the  areas targeted by this assay, and inadequate number of viral copies  (<250 copies / mL). A negative result must be combined with clinical  observations, patient history, and epidemiological information. If result is POSITIVE SARS-CoV-2 target nucleic acids are DETECTED. The SARS-CoV-2 RNA is generally detectable in upper and lower  respiratory specimens dur ing the acute phase of infection.  Positive  results are indicative of active infection with SARS-CoV-2.  Clinical  correlation with patient history and other diagnostic information is  necessary to determine patient infection status.  Positive results do  not rule out bacterial infection or co-infection with other viruses. If result is PRESUMPTIVE POSTIVE SARS-CoV-2 nucleic acids MAY BE PRESENT.   A presumptive positive result was obtained on the submitted specimen  and confirmed on repeat testing.  While 2019 novel coronavirus  (SARS-CoV-2) nucleic acids may be present in the submitted sample   additional confirmatory testing may be necessary for epidemiological  and / or clinical management purposes  to differentiate between  SARS-CoV-2 and other Sarbecovirus currently known to infect humans.  If clinically indicated additional testing with an alternate test  methodology [phone]) is advised. The SARS-CoV-2 RNA is generally  detectable in upper and lower respiratory sp ecimens during the acute  phase of infection. The expected result is Negative. Fact Sheet for Patients:  StrictlyIdeas.no Fact Sheet for Healthcare Providers: BankingDealers.co.za This test is not yet approved or cleared by the [place] FDA and has been authorized for detection and/or diagnosis of SARS-CoV-2 by FDA under an Emergency Use Authorization (EUA).  This EUA will remain in effect (meaning this test can be used) for the duration of the COVID-19 declaration under Section 564(b)(1) of the Act, 21 U.S.C. section 360bbb-3(b)(1), unless the authorization is terminated or revoked sooner. Performed at [facility], [place] [address] [place], [place] [address]      Labs: CBC: Recent Labs  Lab 04/29/19 0438  WBC 9.2  HGB 9.6*  HCT 31.6*  MCV 87.5  PLT 357   Basic Metabolic Panel: Recent Labs  Lab 04/26/19 0455 04/29/19 0438  NA  --  136  K  --  4.0  CL  --  101  CO2  --  28  GLUCOSE  --  127*  BUN  --  31*  CREATININE 0.64 0.59  CALCIUM  --  9.0   CBG: Recent Labs  Lab 04/29/19 1724 04/29/19 2157 04/30/19 0157 04/30/19 0738 04/30/19 1201  GLUCAP 115* 98 109* 108* 154*   Urinalysis    Component Value Date/Time   COLORURINE YELLOW 04/11/2008 0910   APPEARANCEUR CLEAR 04/11/2008 0910   LABSPEC 1.017 04/11/2008 0910   PHURINE 6.0 04/11/2008 0910   GLUCOSEU NEGATIVE 04/11/2008 0910   HGBUR NEGATIVE 04/11/2008 0910   BILIRUBINUR  NEGATIVE 04/11/2008 0910   KETONESUR NEGATIVE 04/11/2008 0910   PROTEINUR  NEGATIVE 04/11/2008 0910   UROBILINOGEN 0.2 04/11/2008 0910   NITRITE NEGATIVE 04/11/2008 0910   LEUKOCYTESUR MODERATE (A) 04/11/2008 0910      Time coordinating discharge: 40 minutes  SIGNED:  Vernell Leep, MD, FACP, [facility]. [facility]  To contact the attending provider between 7A-7P or the covering provider during after hours 7P-7A, please log into the web site [email or web address] and access using universal [place] password for that web site. If you do not have the password, please call the hospital operator.

## 2019-04-27 NOTE — Discharge Instructions (Signed)
Nonweightbearing bilateral lower extremities. Keep left lower extremity splint clean and dry.  Do not remove. Keep right lower extremity dressing clean and dry.  Do not remove. Elevate toes above nose to control swelling. Call [phone] as soon as possible to schedule a follow-up appointment 7 days after discharge from the hospital.   Additional discharge instructions  Please get your medications reviewed and adjusted by your Primary MD.  Please request your Primary MD to go over all Hospital Tests and Procedure/Radiological results at the follow up, please get all Hospital records sent to your Prim MD by signing hospital release before you go home.  If you had Pneumonia of Lung problems at the Hospital: Please get a 2 view Chest X ray done in 6-8 weeks after hospital discharge or sooner if instructed by your Primary MD.  If you have Congestive Heart Failure: Please call your Cardiologist or Primary MD anytime you have any of the following symptoms:  1) 3 pound weight gain in 24 hours or 5 pounds in 1 week  2) shortness of breath, with or without a dry hacking cough  3) swelling in the hands, feet or stomach  4) if you have to sleep on extra pillows at night in order to breathe  Follow cardiac low salt diet and 1.5 lit/day fluid restriction.  If you have diabetes Accuchecks 4 times/day, Once in AM empty stomach and then before each meal. Log in all results and show them to your primary doctor at your next visit. If any glucose reading is under 80 or above 300 call your primary MD immediately.  If you have Seizure/Convulsions/Epilepsy: Please do not drive, operate heavy machinery, participate in activities at heights or participate in high speed sports until you have seen by Primary MD or a Neurologist and advised to do so again.  If you had Gastrointestinal Bleeding: Please ask your Primary MD to check a complete blood count within one week of discharge or at your next visit. Your  endoscopic/colonoscopic biopsies that are pending at the time of discharge, will also need to followed by your Primary MD.  Get Medicines reviewed and adjusted. Please take all your medications with you for your next visit with your Primary MD  Please request your Primary MD to go over all hospital tests and procedure/radiological results at the follow up, please ask your Primary MD to get all Hospital records sent to his/her office.  If you experience worsening of your admission symptoms, develop shortness of breath, life threatening emergency, suicidal or homicidal thoughts you must seek medical attention immediately by calling 911 or calling your MD immediately  if symptoms less severe.  You must read complete instructions/literature along with all the possible adverse reactions/side effects for all the Medicines you take and that have been prescribed to you. Take any new Medicines after you have completely understood and accpet all the possible adverse reactions/side effects.   Do not drive or operate heavy machinery when taking Pain medications.   Do not take more than prescribed Pain, Sleep and Anxiety Medications  Special Instructions: If you have smoked or chewed Tobacco  in the last 2 yrs please stop smoking, stop any regular Alcohol  and or any Recreational drug use.  Wear Seat belts while driving.  Please note You were cared for by a hospitalist during your hospital stay. If you have any questions about your discharge medications or the care you received while you were in the hospital after you are discharged, you  can call the unit and asked to speak with the hospitalist on call if the hospitalist that took care of you is not available. Once you are discharged, your primary care physician will handle any further medical issues. Please note that NO REFILLS for any discharge medications will be authorized once you are discharged, as it is imperative that you return to your primary care  physician (or establish a relationship with a primary care physician if you do not have one) for your aftercare needs so that they can reassess your need for medications and monitor your lab values.  You can reach the hospitalist office at phone [phone] or fax [phone]   If you do not have a primary care physician, you can call [phone] for a physician referral.

## 2019-04-27 NOTE — TOC Progression Note (Signed)
Transition of Care [facility]) - Progression Note    Patient Details  Name: Jessica [name] MRN: [id number] Date of Birth: 1946/04/21  Transition of Care [facility]) CM/SW [place], LCSW Phone Number: 04/27/2019, 4:05 PM  Clinical Narrative:  Patient has bed at [facility]. Patient at this time is unable to discharge to facility due to facility not having a CPAP machine. CSW requested if patient can bring her own CPAP machine but admission coordinator stated that even if she brought her own machine to the facility they would have to quarantine the machine for 2 days and patient will be unable to use it. Facility stated they will go ahead and order CPAP machine and hopefully have it ready for patient by Monday. CSW made MD and patient aware of situation     Expected Discharge Plan: [place] Barriers to Discharge: Continued Medical Work up  Expected Discharge Plan and Services Expected Discharge Plan: [place]   Discharge Planning Services: CM Consult   Living arrangements for the past 2 months: Apartment Expected Discharge Date: 04/27/19               DME Arranged: N/A DME Agency: NA       HH Arranged: NA HH Agency: NA         Social Determinants of Health (SDOH) Interventions    Readmission Risk Interventions No flowsheet data found.

## 2019-04-28 LAB — GLUCOSE, CAPILLARY
Glucose-Capillary: 129 mg/dL — ABNORMAL HIGH (ref 70–99)
Glucose-Capillary: 106 mg/dL — ABNORMAL HIGH (ref 70–99)
Glucose-Capillary: 195 mg/dL — ABNORMAL HIGH (ref 70–99)
Glucose-Capillary: 97 mg/dL (ref 70–99)
Glucose-Capillary: 121 mg/dL — ABNORMAL HIGH (ref 70–99)

## 2019-04-28 NOTE — Progress Notes (Signed)
PROGRESS NOTE   Jessica [name]  QMG:[id number]    DOB: [birth year]    DOA: 04/17/2019  PCP: Cari Caraway, MD   I have briefly reviewed patients previous medical records in [facility].  Chief Complaint  Patient presents with  . Fall     Brief Narrative:  73 year old female with PMH of DM 2, diastolic dysfunction, HLD, HTN, diabetic peripheral neuropathy, obesity, OSA on CPAP, presented on 6/23 following an unwitnessed mechanical fall in the bathroom with associated acute displaced right distal femur fracture and nondisplaced left ankle fracture.  S/p right femur ORIF on 6/25 with incisional VAC placement and splint/supportive care of left ankle fracture.  Patient has been stable for several days now.  She was actually supposed to discharge to SNF and all paperwork was completed on 7/3 only to be informed that SNF was unable to accept her until Monday due to lack of CPAP arrangement.  Discussed with director of clinical social work on 7/3.   Assessment & Plan:   Principal Problem:   Closed right femoral fracture (HCC) Active Problems:   Morbid obesity ([place])   Type 2 diabetes mellitus ([place])   Pure hypercholesterolemia   Essential hypertension, benign   Sleep apnea   Glaucoma   Displaced supracondylar fracture of distal end of right femur without intracondylar extension ([place])   Closed right distal femur fracture:s/pORIF on 6/25 by Orthopedics.  PT OT has recommended skilled nursing facility, not a candidate for CIR (requested by patient). Waiting for bed offers for SNF once clearance. Cont lovenox for prophylaxis started 6/26.  COVID-19 testing now negative in preparation of SNF admission. Orthopedic follow-up 7/2 appreciated, wound VAC removed, nonweightbearing on both lower extremities. Stable.  Constipation: Continue bowel regimen, having BMs now.  Yeast infection: Treated with a dose of Diflucan, resolved.  Nondisplaced left ankle fracture:Continue non-weight  bearing bilaterally per ortho, continue brace continue.   Continue pain control. As per orthopedic follow-up, plan to change splint today.  T2DM:A1c 6.9%. Remains on home insulin infusion and reasonable inpatient control.    Essential hypertension. Controlled on home losartan and lopressor. Heldhome lasix and spirinolactone prior toprocedure and both resumed postoperatively.  Asymptomatic E. coli bacteriuria  HLD: Continue home home zocor  CHF with preserved EF: Compensated. Cont aldactone and Lasix  OSA.CPAP nightly   Morbid obesity with BMI 48, advised weight loss, healthy lifestyle   Stage II mid sacral ulcer: Linear, clean and without acute findings   DVT prophylaxis: Lovenox Code Status: Full Family Communication: None at bedside Disposition: DC to SNF possibly 7/6.   Consultants:  Orthopedic  Procedures:  ORIF of right distal femur fracture 6/25  Antimicrobials:  Ancef 6/26 x 1 Diflucan 6/27 x 1   Subjective: No new complaints reported.  States that orthopedics was by to see her today and they plan to change her left lower extremity splint.  No pain reported.  Had BM this morning.  ROS: As above, otherwise negative  Objective:  Vitals:   04/27/19 1105 04/27/19 1755 04/27/19 2134 04/28/19 0515  BP: (!) 122/45 (!) 104/33 (!) 130/49 (!) 121/49  Pulse: 100 85 75 68  Resp: 20 17 18 20   Temp: 99 F (37.2 C) 98.8 F (37.1 C) 98.3 F (36.8 C) 98.2 F (36.8 C)  TempSrc: Oral Oral Oral Oral  SpO2: 97% 98% 97% 98%  Weight:      Height:        Examination:  General exam: Pleasant elderly female, moderately built and  morbidly obese lying comfortably propped up in bed without distress Respiratory system: Clear to auscultation.  No increased work of breathing.  Stable. Cardiovascular system: S1 & S2 heard, RRR. No JVD, murmurs, rubs, gallops or clicks. No pedal edema.  Stable. Gastrointestinal system: Abdomen is nondistended, soft and nontender.  No organomegaly or masses felt. Normal bowel sounds heard.  Stable Central nervous system: Alert and oriented. No focal neurological deficits. Extremities: Symmetric 5 x 5 power.  Bilateral lower extremity power assessment limited secondary to pain from surgery and fracture.  Right thigh postop dressing clean and dry.  Mild ecchymosis over right lateral groin.  Left leg and wrapped.  Stable without change Skin: Small stage II linear mid gluteal cleft ulcer without acute findings as examined on 7/1. Psychiatry: Judgement and insight appear normal. Mood & affect appropriate.     Data Reviewed: I have personally reviewed following labs and imaging studies  CBC: Recent Labs  Lab 04/22/19 0446  WBC 9.6  HGB 9.0*  HCT 29.0*  MCV 88.7  PLT 161   Basic Metabolic Panel: Recent Labs  Lab 04/26/19 0455  CREATININE 0.64   Liver Function Tests: No results for input(s): AST, ALT, ALKPHOS, BILITOT, PROT, ALBUMIN in the last 168 hours. Coagulation Profile: No results for input(s): INR, PROTIME in the last 168 hours. Cardiac Enzymes: No results for input(s): CKTOTAL, CKMB, CKMBINDEX, TROPONINI in the last 168 hours. HbA1C: No results for input(s): HGBA1C in the last 72 hours. CBG: Recent Labs  Lab 04/27/19 1311 04/27/19 1638 04/27/19 2222 04/28/19 0149 04/28/19 0728  GLUCAP 127* 149* 104* 129* 106*    Recent Results (from the past 240 hour(s))  Urine Culture     Status: Abnormal   Collection Time: 04/19/19 12:53 PM   Specimen: Urine, Catheterized  Result Value Ref Range Status   Specimen Description   Final    URINE, CATHETERIZED Performed at [facility] [address], [place], [place] [address]    Special Requests   Final    NONE Performed at [facility], [place] [address], [place], [place] [address]    Culture >=100,000 COLONIES/mL ESCHERICHIA COLI (A)  Final   Report Status 04/21/2019 FINAL  Final   Organism ID, Bacteria ESCHERICHIA  COLI (A)  Final      Susceptibility   Escherichia coli - MIC*    AMPICILLIN <=2 SENSITIVE Sensitive     CEFAZOLIN <=4 SENSITIVE Sensitive     CEFTRIAXONE <=1 SENSITIVE Sensitive     CIPROFLOXACIN <=0.25 SENSITIVE Sensitive     GENTAMICIN <=1 SENSITIVE Sensitive     IMIPENEM <=0.25 SENSITIVE Sensitive     NITROFURANTOIN <=16 SENSITIVE Sensitive     TRIMETH/SULFA <=20 SENSITIVE Sensitive     AMPICILLIN/SULBACTAM <=2 SENSITIVE Sensitive     PIP/TAZO <=4 SENSITIVE Sensitive     Extended ESBL NEGATIVE Sensitive     * >=100,000 COLONIES/mL ESCHERICHIA COLI  Novel Coronavirus, NAA (hospital order; send-out to ref lab)     Status: None   Collection Time: 04/23/19  5:51 PM   Specimen: Nasopharyngeal Swab; Respiratory  Result Value Ref Range Status   SARS-CoV-2, NAA NOT DETECTED NOT DETECTED Final    Comment: (NOTE) This test was developed and its performance characteristics determined by [employer]. This test has not been FDA cleared or approved. This test has been authorized by FDA under an Emergency Use Authorization (EUA). This test is only authorized for the duration of time the declaration that circumstances exist justifying  the authorization of the emergency use of in vitro diagnostic tests for detection of SARS-CoV-2 virus and/or diagnosis of COVID-19 infection under section 564(b)(1) of the Act, 21 U.S.C. 086VHQ-4(O)(9), unless the authorization is terminated or revoked sooner. When diagnostic testing is negative, the possibility of a false negative result should be considered in the context of a patient's recent exposures and the presence of clinical signs and symptoms consistent with COVID-19. An individual without symptoms of COVID-19 and who is not shedding SARS-CoV-2 virus would expect to have a negative (not detected) result in this assay. Performed  At: [facility] [place], [place] [id number] Rush Farmer MD KG:[id number]     [place]  Final    Comment: Performed at [facility] [address], [place], [place] [address]         Radiology Studies: No results found.      Scheduled Meds: . cholecalciferol  2,000 Units Oral Daily  . docusate sodium  100 mg Oral BID  . dorzolamide-timolol  1 drop Both Eyes BID  . enoxaparin (LOVENOX) injection  40 mg Subcutaneous Q24H  . feeding supplement (PRO-STAT SUGAR FREE 64)  30 mL Oral Daily  . furosemide  20 mg Oral Daily  . insulin pump   Subcutaneous TID AC, HS, 0200  . latanoprost  1 drop Both Eyes QHS  . losartan  100 mg Oral Daily  . metoprolol tartrate  100 mg Oral BID  . multivitamin with minerals  1 tablet Oral Daily  . polyethylene glycol  17 g Oral Daily  . Ensure Max Protein  11 oz Oral Daily  . senna  1 tablet Oral BID  . simvastatin  40 mg Oral QPM  . spironolactone  25 mg Oral Daily   Continuous Infusions:   LOS: 11 days     Vernell Leep, MD, FACP, [facility]. [facility]  To contact the attending provider between 7A-7P or the covering provider during after hours 7P-7A, please log into the web site [email or web address] and access using universal [place] password for that web site. If you do not have the password, please call the hospital operator.  04/28/2019, 12:36 PM

## 2019-04-28 NOTE — Progress Notes (Signed)
Subjective: 9 Days Post-Op Procedure(s) (LRB): OPEN REDUCTION INTERNAL FIXATION (ORIF) DISTAL FEMUR FRACTURE (Right)  Patient reports pain as mild to moderate.  Resting comfortably in bed.  Tolerating POs well.  Denies fever, chill, N/V, CP, SOB.  States that her swelling as gone down on her left ankle and that her splint has become loose.  She feels as though she is rubbing a sore on her left heel.  Objective:   VITALS:  Temp:  [98.2 F (36.8 C)-99 F (37.2 C)] 98.2 F (36.8 C) (07/04 0515) Pulse Rate:  [68-100] 68 (07/04 0515) Resp:  [17-20] 20 (07/04 0515) BP: (104-130)/(33-49) 121/49 (07/04 0515) SpO2:  [97 %-98 %] 98 % (07/04 0515)  General: WDWN patient in NAD. Psych:  Appropriate mood and affect. Neuro:  A&O x 3, Moving all extremities, sensation intact to light touch HEENT:  EOMs intact Chest:  Even non-labored respirations Skin:  SLS on L LE C/D/I, no rashes or lesions.  Dressing on R thigh C/D/I.   Extremities: warm/dry, mild edema about R thigh, no erythema or echymosis.  No lymphadenopathy. Pulses: Popliteus 2+ MSK:  ROM: EHL/FHL intact, MMT: able to perform quad set    LABS No results for input(s): HGB, WBC, PLT in the last 72 hours. Recent Labs    04/26/19 0455  CREATININE 0.64   No results for input(s): LABPT, INR in the last 72 hours.   Assessment/Plan: 9 Days Post-Op Procedure(s) (LRB): OPEN REDUCTION INTERNAL FIXATION (ORIF) DISTAL FEMUR FRACTURE (Right)  Patient seen in rounds for Dr. Lyla Glassing. NWB BLE Spoke to nurse.  She will place a call to ortho tech for new NWB SLS on L LE. DVT ppx: Lovenox Up with therapy Daily dry dressing changes. Disp: D/C planning  Mechele Claude PA-C [facility] Office:  [phone]

## 2019-04-29 ENCOUNTER — Inpatient Hospital Stay (HOSPITAL_COMMUNITY): Payer: Medicare Other

## 2019-04-29 LAB — CBC
HCT: 31.6 % — ABNORMAL LOW (ref 36.0–46.0)
Hemoglobin: 9.6 g/dL — ABNORMAL LOW (ref 12.0–15.0)
MCH: 26.6 pg (ref 26.0–34.0)
MCHC: 30.4 g/dL (ref 30.0–36.0)
MCV: 87.5 fL (ref 80.0–100.0)
Platelets: 299 10*3/uL (ref 150–400)
RBC: 3.61 MIL/uL — ABNORMAL LOW (ref 3.87–5.11)
RDW: 14.5 % (ref 11.5–15.5)
WBC: 9.2 10*3/uL (ref 4.0–10.5)
nRBC: 0 % (ref 0.0–0.2)

## 2019-04-29 LAB — GLUCOSE, CAPILLARY
Glucose-Capillary: 114 mg/dL — ABNORMAL HIGH (ref 70–99)
Glucose-Capillary: 125 mg/dL — ABNORMAL HIGH (ref 70–99)
Glucose-Capillary: 158 mg/dL — ABNORMAL HIGH (ref 70–99)
Glucose-Capillary: 115 mg/dL — ABNORMAL HIGH (ref 70–99)
Glucose-Capillary: 98 mg/dL (ref 70–99)

## 2019-04-29 LAB — BASIC METABOLIC PANEL
Anion gap: 7 (ref 5–15)
BUN: 31 mg/dL — ABNORMAL HIGH (ref 8–23)
CO2: 28 mmol/L (ref 22–32)
Calcium: 9 mg/dL (ref 8.9–10.3)
Chloride: 101 mmol/L (ref 98–111)
Creatinine, Ser: 0.59 mg/dL (ref 0.44–1.00)
GFR calc Af Amer: >60 mL/min (ref >60)
GFR calc non Af Amer: >60 mL/min (ref >60)
Glucose, Bld: 127 mg/dL — ABNORMAL HIGH (ref 70–99)
Potassium: 4 mmol/L (ref 3.5–5.1)
Sodium: 136 mmol/L (ref 135–145)

## 2019-04-29 LAB — SARS CORONAVIRUS 2 BY RT PCR (HOSPITAL ORDER, PERFORMED IN ~~LOC~~ HOSPITAL LAB): SARS Coronavirus 2: NEGATIVE

## 2019-04-29 NOTE — Progress Notes (Signed)
RN left message with weekend SW regarding finalizing details for patient d/c to [facility] on Monday, July 6.

## 2019-04-29 NOTE — Progress Notes (Signed)
Subjective: 10 Days Post-Op Procedure(s) (LRB): OPEN REDUCTION INTERNAL FIXATION (ORIF) DISTAL FEMUR FRACTURE (Right) Patient reports pain as mild.   Patient seen in rounds for Dr. Lyla Glassing. Patient is well, and has had no acute complaints or problems other than pain. She reports some pain in the left ankle, right hip, and right toes. She states she hyperextended her right toes during her fall, and they have been sore since that time. Her left ankle splint was changed yesterday, and there was no reported wounds about the left heel. She states her heel is much more comfortable today. She states she has been eating well. She denies CP/SHOB/calf pain. Purewick in place. Positive bowel movement today.   We will continue therapy today.   Objective: Vital signs in last 24 hours: Temp:  [98.1 F (36.7 C)-98.4 F (36.9 C)] 98.1 F (36.7 C) (07/05 1210) Pulse Rate:  [68-82] 68 (07/05 1210) Resp:  [15-20] 18 (07/05 1210) BP: (110-142)/(40-54) 142/54 (07/05 1210) SpO2:  [97 %-99 %] 99 % (07/05 1210)  Intake/Output from previous day:  Intake/Output Summary (Last 24 hours) at 04/29/2019 1319 Last data filed at 04/29/2019 0641 Gross per 24 hour  Intake -  Output 1500 ml  Net -1500 ml     Intake/Output this shift: No intake/output data recorded.  Labs: Recent Labs    04/29/19 0438  HGB 9.6*   Recent Labs    04/29/19 0438  WBC 9.2  RBC 3.61*  HCT 31.6*  PLT 299   Recent Labs    04/29/19 0438  NA 136  K 4.0  CL 101  CO2 28  BUN 31*  CREATININE 0.59  GLUCOSE 127*  CALCIUM 9.0   No results for input(s): LABPT, INR in the last 72 hours.  Exam: General - Patient is Alert and Oriented Extremity - Neurologically intact Sensation intact distally Intact pulses distally Dorsiflexion/Plantar flexion intact Dressing - dressing C/D/I. Right hip dressing changed today. Motor Function - intact, moving foot and toes well on exam.   Past Medical History:  Diagnosis Date  .  Anemia   . Anxiety   . Arthritis   . Cataract   . Depression   . Diabetes mellitus without complication ([place])   . Diastolic dysfunction   . Frozen shoulder   . Glaucoma   . Headache    History of migraines  . Heart murmur   . History of kidney stones   . Hyperlipidemia   . Hypertension   . Macular degeneration   . Neuropathy   . Obesity   . Sleep apnea   . Stroke [facility])    has been told by a Dr. Mort Sawyers that she may have had a mini stroke but no further testing was done to confirm  . Tachycardia     Assessment/Plan: 10 Days Post-Op Procedure(s) (LRB): OPEN REDUCTION INTERNAL FIXATION (ORIF) DISTAL FEMUR FRACTURE (Right) Principal Problem:   Closed right femoral fracture (HCC) Active Problems:   Morbid obesity ([place])   Type 2 diabetes mellitus ([place])   Pure hypercholesterolemia   Essential hypertension, benign   Sleep apnea   Glaucoma   Displaced supracondylar fracture of distal end of right femur without intracondylar extension ([place])  Estimated body mass index is 48.27 kg/m as calculated from the following:   Height as of this encounter: 4\' 11"  (1.499 m).   Weight as of this encounter: 108.4 kg. Advance diet Up with therapy  DVT Prophylaxis - Lovenox NWB to BLE  Plan is to go  Skilled nursing facility at [facility] after hospital stay. Plan for discharge Monday. SNF was unable to accept patient until Monday due to need for CPAP arrangements. I spoke with patient's daughter, Eula Fried at [phone], on request from patient to provide update. Patient to continue PT/OT. Daily dry dressing changes. Follow up post-op with Dr. Lyla Glassing in the office for staple removal and recheck.  Griffith Citron, PA-C Orthopedic Surgery 04/29/2019, 1:19 PM

## 2019-04-29 NOTE — Progress Notes (Addendum)
PROGRESS NOTE   Jessica [name]  VOH:[id number]    DOB: Sep 07, 1946    DOA: 04/17/2019  PCP: Cari Caraway, MD   I have briefly reviewed patients previous medical records in [facility].  Chief Complaint  Patient presents with  . Fall     Brief Narrative:  73 year old female with PMH of DM 2, diastolic dysfunction, HLD, HTN, diabetic peripheral neuropathy, obesity, OSA on CPAP, presented on 6/23 following an unwitnessed mechanical fall in the bathroom with associated acute displaced right distal femur fracture and nondisplaced left ankle fracture.  S/p right femur ORIF on 6/25 with incisional VAC placement and splint/supportive care of left ankle fracture.  Patient has been stable for several days now.  She was actually supposed to discharge to SNF and all paperwork was completed on 7/3 only to be informed that SNF was unable to accept her until Monday due to lack of CPAP arrangement.  Discussed with director of clinical social work on 7/3.  Patient has been medically stable for discharge to SNF for rehab for close to a week now and discharge has been delayed due to pending insurance approval initially and now for CPAP arrangement at SNF.   Assessment & Plan:   Principal Problem:   Closed right femoral fracture (HCC) Active Problems:   Morbid obesity ([place])   Type 2 diabetes mellitus ([place])   Pure hypercholesterolemia   Essential hypertension, benign   Sleep apnea   Glaucoma   Displaced supracondylar fracture of distal end of right femur without intracondylar extension ([place])   Closed right distal femur fracture:s/pORIF on 6/25 by Orthopedics.  PT OT has recommended skilled nursing facility, not a candidate for CIR (requested by patient). Waiting for bed offers for SNF once clearance. Cont lovenox for prophylaxis started 6/26.  COVID-19 testing now negative in preparation of SNF admission. Orthopedic follow-up 7/2 appreciated, wound VAC removed, nonweightbearing on both lower  extremities. Stable without change.  Constipation: Continue bowel regimen, having BMs now.  Yeast infection: Treated with a dose of Diflucan, resolved.  Nondisplaced left ankle fracture:Continue non-weight bearing bilaterally per ortho, continue brace continue.   Continue pain control. As per orthopedic follow-up, left lower extremity splint changed 7/4 and patient more comfortable with this.  T2DM:A1c 6.9%. Remains on home insulin infusion and reasonable inpatient control.    Resume home oral medications at discharge.  Essential hypertension. Controlled on losartan, Lopressor, Lasix and spironolactone.  Asymptomatic E. coli bacteriuria  HLD: Continue simvastatin.  CHF with preserved EF: Compensated. Cont aldactone and Lasix.  BMP reviewed and stable.  OSA.CPAP nightly   Morbid obesity with BMI 48, advised weight loss, healthy lifestyle   Stage II mid sacral ulcer: Linear, clean and without acute findings.  Needs to change position frequently, offload pressure on back and monitor closely.   Anemia Presented with hemoglobin of 13.4.  Anemia likely due to acute blood loss from fracture and related surgery.  Hemoglobin has been stable in the 9 g range.   DVT prophylaxis: Lovenox Code Status: Full Family Communication: Discussed with patient's daughter, updated care and answered questions. Disposition: DC to SNF possibly 7/6.   Consultants:  Orthopedic  Procedures:  ORIF of right distal femur fracture 6/25  Antimicrobials:  Ancef 6/26 x 1 Diflucan 6/27 x 1   Subjective: Patient interviewed and examined along with RN at bedside.  No complaints reported.  Left leg feels better after new splint.  ROS: As above, otherwise negative  Objective:  Vitals:   04/28/19  1414 04/28/19 2048 04/28/19 2108 04/29/19 0444  BP: (!) 119/48 (!) 134/45  (!) 110/40  Pulse: 71 75  82  Resp: 16 20 20 15   Temp: 98.4 F (36.9 C) 98.2 F (36.8 C)  98.3 F (36.8 C)   TempSrc: Oral Oral  Oral  SpO2: 97% 98%  97%  Weight:      Height:        Examination: No significant change in clinical exam compared to yesterday.  General exam: Pleasant elderly female, moderately built and morbidly obese lying comfortably propped up in bed without distress Respiratory system: Clear to auscultation.  No increased work of breathing.  Stable. Cardiovascular system: S1 & S2 heard, RRR. No JVD, murmurs, rubs, gallops or clicks. No pedal edema.  Stable. Gastrointestinal system: Abdomen is nondistended, soft and nontender. No organomegaly or masses felt. Normal bowel sounds heard.  Stable Central nervous system: Alert and oriented. No focal neurological deficits. Extremities: Symmetric 5 x 5 power.  Bilateral lower extremity power assessment limited secondary to pain from surgery and fracture.  Right thigh postop dressing clean and dry.  Mild ecchymosis over right lateral groin.  Left leg and wrapped.  Stable without change Skin: Small stage II linear mid gluteal cleft ulcer without acute findings as examined on 7/1. Psychiatry: Judgement and insight appear normal. Mood & affect appropriate.     Data Reviewed: I have personally reviewed following labs and imaging studies  CBC: Recent Labs  Lab 04/29/19 0438  WBC 9.2  HGB 9.6*  HCT 31.6*  MCV 87.5  PLT 161   Basic Metabolic Panel: Recent Labs  Lab 04/26/19 0455 04/29/19 0438  NA  --  136  K  --  4.0  CL  --  101  CO2  --  28  GLUCOSE  --  127*  BUN  --  31*  CREATININE 0.64 0.59  CALCIUM  --  9.0   CBG: Recent Labs  Lab 04/28/19 1410 04/28/19 1656 04/28/19 2045 04/29/19 0151 04/29/19 0829  GLUCAP 195* 97 121* 114* 125*    Recent Results (from the past 240 hour(s))  Urine Culture     Status: Abnormal   Collection Time: 04/19/19 12:53 PM   Specimen: Urine, Catheterized  Result Value Ref Range Status   Specimen Description   Final    URINE, CATHETERIZED Performed at [facility] [address], [place], [place] [address]    Special Requests   Final    NONE Performed at [facility], [place] [address], [place], [place] [address]    Culture >=100,000 COLONIES/mL ESCHERICHIA COLI (A)  Final   Report Status 04/21/2019 FINAL  Final   Organism ID, Bacteria ESCHERICHIA COLI (A)  Final      Susceptibility   Escherichia coli - MIC*    AMPICILLIN <=2 SENSITIVE Sensitive     CEFAZOLIN <=4 SENSITIVE Sensitive     CEFTRIAXONE <=1 SENSITIVE Sensitive     CIPROFLOXACIN <=0.25 SENSITIVE Sensitive     GENTAMICIN <=1 SENSITIVE Sensitive     IMIPENEM <=0.25 SENSITIVE Sensitive     NITROFURANTOIN <=16 SENSITIVE Sensitive     TRIMETH/SULFA <=20 SENSITIVE Sensitive     AMPICILLIN/SULBACTAM <=2 SENSITIVE Sensitive     PIP/TAZO <=4 SENSITIVE Sensitive     Extended ESBL NEGATIVE Sensitive     * >=100,000 COLONIES/mL ESCHERICHIA COLI  Novel Coronavirus, NAA (hospital order; send-out to ref lab)     Status: None   Collection Time: 04/23/19  5:51  PM   Specimen: Nasopharyngeal Swab; Respiratory  Result Value Ref Range Status   SARS-CoV-2, NAA NOT DETECTED NOT DETECTED Final    Comment: (NOTE) This test was developed and its performance characteristics determined by [employer]. This test has not been FDA cleared or approved. This test has been authorized by FDA under an Emergency Use Authorization (EUA). This test is only authorized for the duration of time the declaration that circumstances exist justifying the authorization of the emergency use of in vitro diagnostic tests for detection of SARS-CoV-2 virus and/or diagnosis of COVID-19 infection under section 564(b)(1) of the Act, 21 U.S.C. 035KKX-3(G)(1), unless the authorization is terminated or revoked sooner. When diagnostic testing is negative, the possibility of a false negative result should be considered in the context of a patient's recent exposures and the presence of clinical signs  and symptoms consistent with COVID-19. An individual without symptoms of COVID-19 and who is not shedding SARS-CoV-2 virus would expect to have a negative (not detected) result in this assay. Performed  At: [facility] [place], [place] [id number] Rush Farmer MD CV:[id number]    [place]  Final    Comment: Performed at [facility] [address], [place],  [address]         Radiology Studies: No results found.      Scheduled Meds: . cholecalciferol  2,000 Units Oral Daily  . docusate sodium  100 mg Oral BID  . dorzolamide-timolol  1 drop Both Eyes BID  . enoxaparin (LOVENOX) injection  40 mg Subcutaneous Q24H  . feeding supplement (PRO-STAT SUGAR FREE 64)  30 mL Oral Daily  . furosemide  20 mg Oral Daily  . insulin pump   Subcutaneous TID AC, HS, 0200  . latanoprost  1 drop Both Eyes QHS  . losartan  100 mg Oral Daily  . metoprolol tartrate  100 mg Oral BID  . multivitamin with minerals  1 tablet Oral Daily  . polyethylene glycol  17 g Oral Daily  . Ensure Max Protein  11 oz Oral Daily  . senna  1 tablet Oral BID  . simvastatin  40 mg Oral QPM  . spironolactone  25 mg Oral Daily   Continuous Infusions:   LOS: 12 days     Vernell Leep, MD, FACP, [facility]. [facility]  To contact the attending provider between 7A-7P or the covering provider during after hours 7P-7A, please log into the web site [email or web address] and access using universal [place] password for that web site. If you do not have the password, please call the hospital operator.  04/29/2019, 11:53 AM

## 2019-04-30 DIAGNOSIS — I5032 Chronic diastolic (congestive) heart failure: Secondary | ICD-10-CM | POA: Diagnosis not present

## 2019-04-30 DIAGNOSIS — D649 Anemia, unspecified: Secondary | ICD-10-CM | POA: Diagnosis not present

## 2019-04-30 DIAGNOSIS — N209 Urinary calculus, unspecified: Secondary | ICD-10-CM | POA: Diagnosis not present

## 2019-04-30 DIAGNOSIS — S7291XD Unspecified fracture of right femur, subsequent encounter for closed fracture with routine healing: Secondary | ICD-10-CM | POA: Diagnosis not present

## 2019-04-30 DIAGNOSIS — G609 Hereditary and idiopathic neuropathy, unspecified: Secondary | ICD-10-CM | POA: Diagnosis not present

## 2019-04-30 DIAGNOSIS — B373 Candidiasis of vulva and vagina: Secondary | ICD-10-CM | POA: Diagnosis not present

## 2019-04-30 DIAGNOSIS — M2550 Pain in unspecified joint: Secondary | ICD-10-CM | POA: Diagnosis not present

## 2019-04-30 DIAGNOSIS — E1165 Type 2 diabetes mellitus with hyperglycemia: Secondary | ICD-10-CM | POA: Diagnosis not present

## 2019-04-30 DIAGNOSIS — Z8673 Personal history of transient ischemic attack (TIA), and cerebral infarction without residual deficits: Secondary | ICD-10-CM | POA: Diagnosis not present

## 2019-04-30 DIAGNOSIS — I10 Essential (primary) hypertension: Secondary | ICD-10-CM | POA: Diagnosis not present

## 2019-04-30 DIAGNOSIS — Z9641 Presence of insulin pump (external) (internal): Secondary | ICD-10-CM | POA: Diagnosis not present

## 2019-04-30 DIAGNOSIS — M6281 Muscle weakness (generalized): Secondary | ICD-10-CM | POA: Diagnosis not present

## 2019-04-30 DIAGNOSIS — L89152 Pressure ulcer of sacral region, stage 2: Secondary | ICD-10-CM | POA: Diagnosis not present

## 2019-04-30 DIAGNOSIS — S79929A Unspecified injury of unspecified thigh, initial encounter: Secondary | ICD-10-CM | POA: Diagnosis not present

## 2019-04-30 DIAGNOSIS — Z7401 Bed confinement status: Secondary | ICD-10-CM | POA: Diagnosis not present

## 2019-04-30 DIAGNOSIS — E1140 Type 2 diabetes mellitus with diabetic neuropathy, unspecified: Secondary | ICD-10-CM | POA: Diagnosis not present

## 2019-04-30 DIAGNOSIS — S72341D Displaced spiral fracture of shaft of right femur, subsequent encounter for closed fracture with routine healing: Secondary | ICD-10-CM | POA: Diagnosis not present

## 2019-04-30 DIAGNOSIS — S72391S Other fracture of shaft of right femur, sequela: Secondary | ICD-10-CM | POA: Diagnosis not present

## 2019-04-30 DIAGNOSIS — M1990 Unspecified osteoarthritis, unspecified site: Secondary | ICD-10-CM | POA: Diagnosis not present

## 2019-04-30 DIAGNOSIS — S72451S Displaced supracondylar fracture without intracondylar extension of lower end of right femur, sequela: Secondary | ICD-10-CM | POA: Diagnosis not present

## 2019-04-30 DIAGNOSIS — E785 Hyperlipidemia, unspecified: Secondary | ICD-10-CM | POA: Diagnosis not present

## 2019-04-30 DIAGNOSIS — F418 Other specified anxiety disorders: Secondary | ICD-10-CM | POA: Diagnosis not present

## 2019-04-30 DIAGNOSIS — S8265XD Nondisplaced fracture of lateral malleolus of left fibula, subsequent encounter for closed fracture with routine healing: Secondary | ICD-10-CM | POA: Diagnosis not present

## 2019-04-30 DIAGNOSIS — L89892 Pressure ulcer of other site, stage 2: Secondary | ICD-10-CM | POA: Diagnosis not present

## 2019-04-30 DIAGNOSIS — I110 Hypertensive heart disease with heart failure: Secondary | ICD-10-CM | POA: Diagnosis not present

## 2019-04-30 DIAGNOSIS — I5030 Unspecified diastolic (congestive) heart failure: Secondary | ICD-10-CM | POA: Diagnosis not present

## 2019-04-30 DIAGNOSIS — R41841 Cognitive communication deficit: Secondary | ICD-10-CM | POA: Diagnosis not present

## 2019-04-30 DIAGNOSIS — E7800 Pure hypercholesterolemia, unspecified: Secondary | ICD-10-CM | POA: Diagnosis not present

## 2019-04-30 DIAGNOSIS — L905 Scar conditions and fibrosis of skin: Secondary | ICD-10-CM | POA: Diagnosis not present

## 2019-04-30 DIAGNOSIS — M1712 Unilateral primary osteoarthritis, left knee: Secondary | ICD-10-CM | POA: Diagnosis not present

## 2019-04-30 DIAGNOSIS — E1169 Type 2 diabetes mellitus with other specified complication: Secondary | ICD-10-CM | POA: Diagnosis not present

## 2019-04-30 DIAGNOSIS — R5381 Other malaise: Secondary | ICD-10-CM | POA: Diagnosis not present

## 2019-04-30 DIAGNOSIS — R52 Pain, unspecified: Secondary | ICD-10-CM | POA: Diagnosis not present

## 2019-04-30 DIAGNOSIS — S72451D Displaced supracondylar fracture without intracondylar extension of lower end of right femur, subsequent encounter for closed fracture with routine healing: Secondary | ICD-10-CM | POA: Diagnosis not present

## 2019-04-30 DIAGNOSIS — F4321 Adjustment disorder with depressed mood: Secondary | ICD-10-CM | POA: Diagnosis not present

## 2019-04-30 DIAGNOSIS — Z794 Long term (current) use of insulin: Secondary | ICD-10-CM | POA: Diagnosis not present

## 2019-04-30 DIAGNOSIS — R197 Diarrhea, unspecified: Secondary | ICD-10-CM | POA: Diagnosis not present

## 2019-04-30 DIAGNOSIS — I509 Heart failure, unspecified: Secondary | ICD-10-CM | POA: Diagnosis not present

## 2019-04-30 DIAGNOSIS — Z634 Disappearance and death of family member: Secondary | ICD-10-CM | POA: Diagnosis not present

## 2019-04-30 DIAGNOSIS — G459 Transient cerebral ischemic attack, unspecified: Secondary | ICD-10-CM | POA: Diagnosis not present

## 2019-04-30 DIAGNOSIS — Z9189 Other specified personal risk factors, not elsewhere classified: Secondary | ICD-10-CM | POA: Diagnosis not present

## 2019-04-30 DIAGNOSIS — E669 Obesity, unspecified: Secondary | ICD-10-CM | POA: Diagnosis not present

## 2019-04-30 DIAGNOSIS — G4730 Sleep apnea, unspecified: Secondary | ICD-10-CM | POA: Diagnosis not present

## 2019-04-30 LAB — GLUCOSE, CAPILLARY
Glucose-Capillary: 109 mg/dL — ABNORMAL HIGH (ref 70–99)
Glucose-Capillary: 108 mg/dL — ABNORMAL HIGH (ref 70–99)
Glucose-Capillary: 154 mg/dL — ABNORMAL HIGH (ref 70–99)

## 2019-04-30 NOTE — TOC Transition Note (Signed)
Transition of Care [facility]) - CM/SW Discharge Note   Patient Details  Name: Jessica [name] MRN: [id number] Date of Birth: 11/08/1945  [facility]) CM/SW Contact:  Wende Neighbors, LCSW Phone Number: 04/30/2019, 2:57 PM   Clinical Narrative:   Patient to discharge to [facility] via [place]. RN to call [phone] (rm# [id number]) for report.    Final next level of care: Skilled Nursing Facility Barriers to Discharge: No Barriers Identified   Patient Goals and CMS Choice Patient states their goals for this hospitalization and ongoing recovery are:: to get better      Discharge Placement              Patient chooses bed at: [facility] Patient to be transferred to facility by: ptar Name of family member notified: pt cognitive x 4, she will contact family Patient and family notified of of transfer: 04/30/19  Discharge Plan and Services   Discharge Planning Services: CM Consult            DME Arranged: N/A DME Agency: NA       HH Arranged: NA HH Agency: NA        Social Determinants of Health (SDOH) Interventions     Readmission Risk Interventions No flowsheet data found.

## 2019-04-30 NOTE — Progress Notes (Signed)
Progress Note  Patient was supposed to discharge to SNF on 7/3 but could not because SNF could not arrange CPAP until today.  In the interim, repeat testing for SARS coronavirus x 2 negative.  I was advised by clinical social work that patient can discharge to SNF today and to update DC summary.  DC summary has been updated today including subjective and exam findings.  Patient denies any specific complaints.  She was seen lying comfortably propped up in bed without distress.  Vital signs and physical exam stable.  Plan:  DC to SNF today.  Vernell Leep, MD, FACP, [facility]. [facility]  To contact the attending provider between 7A-7P or the covering provider during after hours 7P-7A, please log into the web site [email or web address] and access using universal  password for that web site. If you do not have the password, please call the hospital operator.

## 2019-04-30 NOTE — Plan of Care (Signed)

## 2019-05-01 ENCOUNTER — Non-Acute Institutional Stay (SKILLED_NURSING_FACILITY): Payer: Medicare Other | Admitting: Adult Health

## 2019-05-01 ENCOUNTER — Encounter: Payer: Self-pay | Admitting: Adult Health

## 2019-05-01 DIAGNOSIS — G4730 Sleep apnea, unspecified: Secondary | ICD-10-CM

## 2019-05-01 DIAGNOSIS — L89152 Pressure ulcer of sacral region, stage 2: Secondary | ICD-10-CM

## 2019-05-01 DIAGNOSIS — E7800 Pure hypercholesterolemia, unspecified: Secondary | ICD-10-CM

## 2019-05-01 DIAGNOSIS — I10 Essential (primary) hypertension: Secondary | ICD-10-CM

## 2019-05-01 DIAGNOSIS — I509 Heart failure, unspecified: Secondary | ICD-10-CM | POA: Diagnosis not present

## 2019-05-01 DIAGNOSIS — S72451S Displaced supracondylar fracture without intracondylar extension of lower end of right femur, sequela: Secondary | ICD-10-CM | POA: Diagnosis not present

## 2019-05-01 DIAGNOSIS — Z794 Long term (current) use of insulin: Secondary | ICD-10-CM | POA: Diagnosis not present

## 2019-05-01 DIAGNOSIS — E1169 Type 2 diabetes mellitus with other specified complication: Secondary | ICD-10-CM

## 2019-05-01 NOTE — Progress Notes (Signed)
Location:  [facility] Room Number: 304-B Place of Service:  SNF (31) Provider:  Durenda Age, DNP, FNP-BC  Patient Care Team: Cari Caraway, MD as PCP - General (Family Medicine)  Extended Emergency Contact Information Primary Emergency Contact: [place] Phone: [phone] Relation: Daughter  Code Status:  Full Code  Goals of care: Advanced Directive information Advanced Directives 05/01/2019  Does Patient Have a Medical Advance Directive? Yes  Type of Advance Directive (No Data)  Does patient want to make changes to medical advance directive? No - Patient declined  Would patient like information on creating a medical advance directive? No - Patient declined     Chief Complaint  Patient presents with   Hospitalization Follow-up    Hospitalization F/U Visit    HPI:  Pt is a 73 y.o. female seen today for an acute visit following admission to [facility] on 04/30/19 from [facility]. She was hospitalized due to an unwitnessed mechanical fall in her home bathroom sustaining an acute displaced right distal femur fracture S/P ORIF on 6/25 with incisional VAC placement. The wound vac has now been discontinued. She, also, sustained a left ankle fracture which was placed on splint/supportive care.    Past Medical History:  Diagnosis Date   Anemia    Anxiety    Arthritis    Cataract    Depression    Diabetes mellitus without complication (HCC)    Diastolic dysfunction    Frozen shoulder    Glaucoma    Headache    History of migraines   Heart murmur    History of kidney stones    Hyperlipidemia    Hypertension    Macular degeneration    Neuropathy    Obesity    Sleep apnea    Stroke [facility])    has been told by a Dr. Mort Sawyers that she may have had a mini stroke but no further testing was done to confirm   Tachycardia    Past  Surgical History:  Procedure Laterality Date   CARDIAC CATHETERIZATION     -6/09-normal ejection fraction 65%, no angiographically significant CAD., M. Skains MD   CESAREAN SECTION     COLONOSCOPY     foot spurs removed     HYSTEROSCOPY W/D&C N/A 06/28/2017   Procedure: DILATATION AND CURETTAGE /HYSTEROSCOPY/POLYPECTOMY WITH MYOSURE;  Surgeon: Christophe Louis, MD;  Location: [facility];  Service: Gynecology;  Laterality: N/A;   JOINT REPLACEMENT     right knee replaced   knee spurs removed     ORIF FEMUR FRACTURE Right 04/19/2019   Procedure: OPEN REDUCTION INTERNAL FIXATION (ORIF) DISTAL FEMUR FRACTURE;  Surgeon: Rod Can, MD;  Location: [facility];  Service: Orthopedics;  Laterality: Right;   shoulder spurs removed     TONSILLECTOMY AND ADENOIDECTOMY     UPPER GI ENDOSCOPY      Allergies  Allergen Reactions   Meloxicam     Having bleeding   Percocet [Oxycodone-Acetaminophen] Other (See Comments)    Headache   Tramadol    Vicodin [Hydrocodone-Acetaminophen] Other (See Comments)    Headache    Azithromycin Rash   Iodine Rash    Outpatient Encounter Medications as of 05/01/2019  Medication Sig   acetaminophen (TYLENOL) 325 MG tablet Take 2 tablets (650 mg total) by mouth every 6 (six) hours as needed for mild pain. Do not take more than 4000mg   of tylenol per day   Amino Acids-Protein Hydrolys (FEEDING SUPPLEMENT, PRO-STAT SUGAR FREE 64,) LIQD Take 30 mLs by mouth daily.   B-D INS SYRINGE 0.5CC/31GX5/16 31G X 5/16" 0.5 ML MISC Use as directed per provider.   Coenzyme Q10 (CO Q-10) 100 MG CAPS Take 100 mg by mouth at bedtime.    docusate sodium (COLACE) 100 MG capsule Take 1 capsule (100 mg total) by mouth 2 (two) times daily.   dorzolamide-timolol (COSOPT) 22.3-6.8 MG/ML ophthalmic solution Place 1 drop into both eyes 2 (two) times daily.    enoxaparin (LOVENOX) 40 MG/0.4ML injection Inject 0.4 mLs (40 mg total) into the skin daily.   Ensure Max Protein (ENSURE  MAX PROTEIN) LIQD Take 330 mLs (11 oz total) by mouth daily.   furosemide (LASIX) 20 MG tablet Take 1 tablet (20 mg total) by mouth daily.   Garlic 0865 MG CAPS Take 1,000 mg by mouth daily.    HYDROmorphone (DILAUDID) 2 MG tablet Take 0.5 tablets (1 mg total) by mouth every 4 (four) hours as needed for severe pain.   insulin lispro (HUMALOG) 100 UNIT/ML injection Inject into the skin daily. Insulin pump   INVOKANA 300 MG TABS tablet Take 300 mg by mouth daily.   Iron-Vitamin C (VITRON-C) 65-125 MG TABS Take 1 tablet by mouth at bedtime.    losartan (COZAAR) 100 MG tablet TAKE 1 TABLET DAILY   metFORMIN (GLUCOPHAGE-XR) 500 MG 24 hr tablet Take 500 mg by mouth at bedtime.    metoprolol tartrate (LOPRESSOR) 100 MG tablet TAKE 1 TABLET TWICE A DAY   Multiple Vitamins-Minerals (PRESERVISION AREDS 2 PO) Take 1 tablet by mouth 2 (two) times daily.   NON FORMULARY REGULAR, HEART HEALTHY - DIABETIC DIET   ONE TOUCH ULTRA TEST test strip 1 each by Other route as needed (Use as directed per provider).    polyethylene glycol (MIRALAX / GLYCOLAX) 17 g packet Take 17 g by mouth daily.   senna (SENOKOT) 8.6 MG TABS tablet Take 1 tablet (8.6 mg total) by mouth 2 (two) times daily.   simvastatin (ZOCOR) 40 MG tablet Take 40 mg by mouth every evening.   spironolactone (ALDACTONE) 25 MG tablet Take 1 tablet (25 mg total) by mouth daily.   Travoprost, BAK Free, (TRAVATAN) 0.004 % SOLN ophthalmic solution Place 1 drop into both eyes at bedtime.    Vitamin D, Ergocalciferol, 2000 units CAPS Take 2,000 Units by mouth daily.   No facility-administered encounter medications on file as of 05/01/2019.     Review of Systems  GENERAL: No change in appetite, no fatigue, no weight changes, no fever, chills or weakness MOUTH and THROAT: Denies oral discomfort, gingival pain or bleeding RESPIRATORY: no cough, SOB, DOE, wheezing, hemoptysis CARDIAC: No chest pain, edema or palpitations GI: No abdominal  pain, diarrhea, constipation, heart burn, nausea or vomiting GU: Denies dysuria, frequency, hematuria, incontinence, or discharge NEUROLOGICAL: Denies dizziness, syncope, numbness, or headache PSYCHIATRIC: Denies feelings of depression or anxiety. No report of hallucinations, insomnia, paranoia, or agitation     There is no immunization history on file for this patient. Pertinent  Health Maintenance Due  Topic Date Due   FOOT EXAM  06/01/2019 (Originally 05/28/1956)   MAMMOGRAM  06/01/2019 (Originally 05/28/1998)   OPHTHALMOLOGY EXAM  06/01/2019 (Originally 05/28/1956)   DEXA SCAN  06/01/2019 (Originally 05/28/2013)   COLONOSCOPY  06/01/2019 (Originally [birth year]/28/1998)   PNA vac Low Risk Adult (1 of 2 - PCV13) 06/01/2019 (Originally 05/28/2013)   INFLUENZA VACCINE  05/26/2019  HEMOGLOBIN A1C  10/18/2019   Fall Risk  12/16/2016 09/06/2016 06/03/2016 04/01/2016 02/05/2016  Falls in the past year? No No No No No  Number falls in past yr: - - - - -  Injury with Fall? - - - - -  Risk Factor Category  - - - - -  Risk for fall due to : - - - - -  Follow up - - - - -  Comment - - - - -     Vitals:   05/01/19 1526  BP: 118/78  Pulse: 90  Resp: 20  Temp: (!) 97.4 F (36.3 C)  TempSrc: Oral  SpO2: 98%  Weight: 238 lb 6.4 oz (108.1 kg)  Height: 4\' 11"  (1.499 m)   Body mass index is 48.15 kg/m.  Physical Exam  GENERAL APPEARANCE: Well nourished. In no acute distress. Morbidly obese SKIN:  Right femur surgical site with staples, dry and no erythema; has sacral ulcer stage 2 MOUTH and THROAT: Lips are without lesions. Oral mucosa is moist and without lesions. Tongue is normal in shape, size, and color and without lesions RESPIRATORY: Breathing is even & unlabored, BS CTAB CARDIAC: RRR, no murmur,no extra heart sounds, no edema GI: Abdomen soft, normal BS, no masses, no tenderness, + insulin pump EXTREMITIES: Left foot/ankle with splint NEUROLOGICAL: There is no tremor. Speech is clear.  Alert and oriented X 3. PSYCHIATRIC:  Affect and behavior are appropriate   Labs reviewed: Recent Labs    04/20/19 0414 04/21/19 0439 04/26/19 0455 04/29/19 0438  NA 138 139  --  136  K 4.3 4.0  --  4.0  CL 104 104  --  101  CO2 23 25  --  28  GLUCOSE 319* 138*  --  127*  BUN 32* 36*  --  31*  CREATININE 0.74 0.65 0.64 0.59  CALCIUM 8.7* 8.5*  --  9.0    Recent Labs    04/17/19 1437  04/21/19 0439 04/22/19 0446 04/29/19 0438  WBC 15.9*   < > 11.3* 9.6 9.2  NEUTROABS 13.1*  --   --   --   --   HGB 13.4   < > 9.3* 9.0* 9.6*  HCT 41.9   < > 28.8* 29.0* 31.6*  MCV 85.9   < > 88.1 88.7 87.5  PLT 202   < > 173 207 299   < > = values in this interval not displayed.   No results found for: TSH Lab Results  Component Value Date   HGBA1C 6.9 (H) 04/18/2019   Lab Results  Component Value Date   CHOL 165 09/03/2014   HDL 32.70 (L) 09/03/2014   LDLDIRECT 67.3 09/03/2014   TRIG (H) 09/03/2014    574.0 Triglyceride is over 400; calculations on Lipids are invalid.   CHOLHDL 5 09/03/2014    Significant Diagnostic Results in last 30 days:  Dg Ankle Complete Left  Result Date: 04/17/2019 CLINICAL DATA:  73 year old female slipped and fell on wet bathroom today. Pain. EXAM: LEFT ANKLE COMPLETE - 3+ VIEW COMPARISON:  None. FINDINGS: Oblique nondisplaced fracture of the distal left fibula metadiaphysis best demonstrated on image 2. This appears to spare the lateral malleolus where 1 or more small chronic appearing ossific fragments are noted. Generalized soft tissue swelling. Chronic appearing fragment at the medial malleolus. Preserved mortise joint alignment. Talar dome intact. Possible joint effusion. The calcaneus and visible bones of the left foot appear intact. IMPRESSION: 1. Oblique nondisplaced fracture of the distal left  fibula metadiaphysis. 2. Superimposed chronic appearing fracture fragments at both the medial and lateral malleolus. No other acute fracture or dislocation  identified about the left ankle. Electronically Signed   By: Genevie Ann M.D.   On: 04/17/2019 15:52   Ct Ankle Left Wo Contrast  Result Date: 04/17/2019 CLINICAL DATA:  Evaluate ankle fracture. EXAM: CT OF THE LEFT ANKLE WITHOUT CONTRAST TECHNIQUE: Multidetector CT imaging of the left ankle was performed according to the standard protocol. Multiplanar CT image reconstructions were also generated. COMPARISON:  Radiographs, same date. FINDINGS: There is a nondisplaced oblique fracture of the distal fibular shaft at and above the level of the ankle mortise. There is also mild lateral mortise widening which may suggest a ligamentous injury. No fracture of the tibia is identified. Remote avulsion type fractures along the distal tip of the medial malleolus. The talus is intact. The subtalar joints are maintained. Mild degenerative changes. No midfoot or hindfoot fractures are identified. Moderate midfoot degenerative changes. Calcaneal spurring changes are noted. IMPRESSION: 1. Nondisplaced fracture involving the distal fibular shaft. 2. No fracture of the tibia or talus. 3. Mild lateral ankle mortise widening may suggest a ligamentous injury. Electronically Signed   By: Marijo Sanes M.D.   On: 04/17/2019 18:48   Dg Chest Port 1 View  Result Date: 04/29/2019 CLINICAL DATA:  Status post fall today. EXAM: PORTABLE CHEST 1 VIEW COMPARISON:  April 17, 2019 FINDINGS: The heart size and mediastinal contours are within normal limits. Both lungs are clear. The visualized skeletal structures are unremarkable. IMPRESSION: No active cardiopulmonary disease. Electronically Signed   By: Abelardo Diesel M.D.   On: 04/29/2019 16:44   Chest Portable 1 View  Result Date: 04/17/2019 CLINICAL DATA:  Hypertension EXAM: PORTABLE CHEST 1 VIEW COMPARISON:  None. FINDINGS: The heart size is mildly enlarged. There is no acute osseous abnormality. No pneumothorax. No large pleural effusion. No significant area of consolidation. IMPRESSION:  No active disease. Electronically Signed   By: Constance Holster M.D.   On: 04/17/2019 19:37   Dg C-arm 1-60 Min-no Report  Result Date: 04/19/2019 Fluoroscopy was utilized by the requesting physician.  No radiographic interpretation.   Dg Hip Unilat  With Pelvis 2-3 Views Right  Result Date: 04/17/2019 CLINICAL DATA:  73 year old female slipped and fell on wet bathroom today. Pain. EXAM: DG HIP (WITH OR WITHOUT PELVIS) 2-3V RIGHT COMPARISON:  Right hip series 12/19/2017. FINDINGS: Femoral heads remain normally located. The pelvis appears stable and intact. Grossly intact proximal left femur. The proximal right femur appears stable and intact. Chronic pelvic phleboliths. Negative visible bowel gas pattern. IMPRESSION: No acute fracture or dislocation identified about the right hip or pelvis. Electronically Signed   By: Genevie Ann M.D.   On: 04/17/2019 15:50   Dg Femur, Min 2 Views Right  Result Date: 04/19/2019 CLINICAL DATA:  Femur fracture EXAM: RIGHT FEMUR 2 VIEWS COMPARISON:  04/17/2019 FINDINGS: Four low resolution intraoperative spot views of the right femur. Total fluoroscopy time was 1 minutes 25 seconds. Prior right knee replacement. Surgical plate and screw fixation of the femur for comminuted distal femoral fracture. Placement of cerclage wires at the distal femur. Anatomic alignment is noted. IMPRESSION: Intraoperative fluoroscopic assistance provided during internal fixation of distal femoral fracture Electronically Signed   By: Donavan Foil M.D.   On: 04/19/2019 16:07   Dg Femur Min 2 Views Right  Result Date: 04/17/2019 CLINICAL DATA:  Fall. EXAM: RIGHT FEMUR 2 VIEWS COMPARISON:  12/19/2017 FINDINGS: Right knee  replacement. Spiral fracture distal femur above the knee prosthesis. Displacement and angulation. No other femur fracture.  Right hip intact. IMPRESSION: Spiral fracture distal femur with displacement and angulation. Right knee replacement. Electronically Signed   By: Franchot Gallo M.D.   On: 04/17/2019 16:56   Dg Femur Port, Min 2 Views Right  Result Date: 04/19/2019 CLINICAL DATA:  Postop EXAM: RIGHT FEMUR PORTABLE 2 VIEW COMPARISON:  04/17/2019 FINDINGS: Lateral cutaneous staples. Interval surgical plate, screw and cerclage wire fixation of the femur from the proximal shaft to the knee replacement. This bridges acute comminuted distal femoral fracture. Reduction of fracture displacement, now with anatomic alignment. Prior right knee replacement. Gas in the soft tissues consistent with recent surgery. IMPRESSION: Interval internal fixation of comminuted distal femoral fracture, now with anatomic alignment. Expected postsurgical changes Electronically Signed   By: Donavan Foil M.D.   On: 04/19/2019 19:31    Assessment/Plan  1. Closed displaced supracondylar fracture of distal end of right femur without intracondylar extension, sequela - S/P ORIF on 04/19/19, will have PT and OT for therapeutic and strengthening exercises, follow-up with Dr. Lyla Glassing, orthopedic surgery, in 10 days for staples removal, continue hydromorphone 2 mg 1/2 tab = 1mg  Q 4 hours PRN for pain and Lovenox 40 mg SQ daily for DVT prophylaxis  2. Essential hypertension -Well-controlled, continue Cozaar 100 mg 1 tab daily and Lopressor 100 mg 1 tab twice a day  3. Type 2 diabetes mellitus with other specified complication, with long-term current use of insulin (HCC) Lab Results  Component Value Date   HGBA1C 6.9 (H) 04/18/2019  -Well-controlled, continue Humalog 100 unit/mL insulin pump daily, Invokana 300 mg 1 tab daily, Glucophage Exar 500 mg 1 tab at bedtime, 3 times a day and at bedtime  4. Chronic congestive heart failure, unspecified heart failure type (HCC) - no SOB, continue Lasix 20 mg 1 tab daily, Aldactone 25 mg 1 tab daily, Cozaar 100 mg 1 tab daily and Lopressor 100 mg 1 tab twice a day   5. Pure hypercholesterolemia -Continue Zocor 40 mg 1 tab every evening  6. Pressure ulcer of  sacral region, stage 2 ([place]) - treatment daily, turn to sides when in bed, keep skin clean and dry  7. Morbid obesity ([place]) -Counseled on weight management and food choices  8. Sleep apnea, unspecified type -Continue CPAP at bedtime     Family/ staff Communication: Discussed plan of care with resident.  Labs/tests ordered:  CBC and BMP on 05/03/19  Goals of care:  Short-term care   Durenda Age, DNP, FNP-BC [facility] [phone] (Monday-Friday 8:00 a.m. - 5:00 p.m.) [phone] (after hours)

## 2019-05-02 ENCOUNTER — Encounter: Payer: Self-pay | Admitting: Internal Medicine

## 2019-05-02 ENCOUNTER — Non-Acute Institutional Stay (SKILLED_NURSING_FACILITY): Payer: Medicare Other | Admitting: Internal Medicine

## 2019-05-02 DIAGNOSIS — E1169 Type 2 diabetes mellitus with other specified complication: Secondary | ICD-10-CM

## 2019-05-02 DIAGNOSIS — Z9189 Other specified personal risk factors, not elsewhere classified: Secondary | ICD-10-CM | POA: Diagnosis not present

## 2019-05-02 DIAGNOSIS — S72451S Displaced supracondylar fracture without intracondylar extension of lower end of right femur, sequela: Secondary | ICD-10-CM

## 2019-05-02 DIAGNOSIS — G4730 Sleep apnea, unspecified: Secondary | ICD-10-CM

## 2019-05-02 DIAGNOSIS — L89152 Pressure ulcer of sacral region, stage 2: Secondary | ICD-10-CM

## 2019-05-02 DIAGNOSIS — Z794 Long term (current) use of insulin: Secondary | ICD-10-CM | POA: Diagnosis not present

## 2019-05-02 NOTE — Assessment & Plan Note (Signed)
Orthopedic follow-up 05/14/2019

## 2019-05-02 NOTE — Assessment & Plan Note (Signed)
Above discussed with patient

## 2019-05-02 NOTE — Assessment & Plan Note (Addendum)
Glucoses range at the SNF 88-208 A1c of 6.9%  indicates excellent control

## 2019-05-02 NOTE — Progress Notes (Signed)
NURSING HOME LOCATION:  [facility] ROOM NUMBER:  [id number]  CODE STATUS:  Full Code  PCP:  Cari Caraway, [place] [address]  Endocrinology Dr Cyd Silence  This is a comprehensive admission note to [facility] performed on this date less than 30 days from date of admission.  Included are preadmission medical/surgical history; reconciled medication list; family history; social history and comprehensive review of systems. Corrections and additions to the records were documented. Comprehensive physical exam was also performed. Additionally a clinical summary was entered for each active diagnosis pertinent to this admission in the Problem List to enhance continuity of care.  HPI: Patient was hospitalized 6/23-04/30/2019 for closed right femoral fracture sustained in an unwitnessed mechanical fall.  She sustained a displaced supracondylar fracture of the distal end of the right femur without intracondylar extension and nondisplaced left ankle fracture.  ORIF was performed by Dr. Rod Can on 6/25 with incisional VAC placement and splint/supportive care of left ankle fracture.  Prophylactic Lovenox was started 6/26.  Wound VAC was removed 7/2.  Orthopedics recommended nonweightbearing for both lower extremities.   Perioperatively she developed anemia.  Presenting hemoglobin was 13.4;the most recent H/H were 9.6/31.6 Over the week prior to discharge the patient had not been requesting opioid pain medications. Stage II mid sacral ulcer was present; this was described as clean without acute findings. Hospital course was complicated by constipation which did improve.  She received Diflucan for yeast infection. She did have asymptomatic E. coli bacteriuria. The patient had been scheduled to be discharged to the SNF on 7/3 but arrangements had to be made for CPAP to treat her obstructive sleep apnea. CBGs were to be monitored before meals and at bedtime; the patient  is on insulin pump.  A1c was 6.9% indicating excellent control. Endocrinologist , Dr Chalmers Cater, follows the patient as an outpatient. Orthopedic follow-up was recommended in 10 days for removal of staples.   Past medical and surgical history: Includes possible past history of stroke ( based on clinical assessment, not imaging), sleep apnea, obesity, peripheral neuropathy, macular degeneration, essential hypertension, dyslipidemia, history of renal calculi, glaucoma, uncomplicated diabetes, anxiety/depression, and diastolic dysfunction Surgeries include hysteroscopy with D&C and total right knee.  She has had no ENT surgery for obstructive sleep apnea.  Social history: Nondrinker; never smoked.  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  Family history: Reviewed.  Her sister did have bipolar disorder   Review of systems:  She states that her last A1c was 7% at Dr. Almetta Lovely office.  She states that her glucose range has been 88 up to 208.  With the 88 she did have some loss of bowel control.  She denies any other hypoglycemic symptoms.  She typically does have loose stools related to Colace.  She also describes intermittent abdominal cramps which she relates to suboptimal fluid intake.  She states that Tylenol does provide an adequate pain relief and has no need for the opioids.  She believes she developed a sacral wound while she was on narcotics and very constipated.  She made the comment that she was "passing stones". Her history of "stroke" may have been TIAs diagnosed clinically over 4 decades ago.  She does have some weakness of left upper extremity which she relates to rotator cuff.  Apparently she has had an injection for this.  Constitutional: No fever, significant weight change, fatigue  Eyes: No redness, discharge, pain, vision change ENT/mouth: No  nasal congestion, purulent discharge, earache, change in hearing, sore throat  Cardiovascular: No chest pain, palpitations,  paroxysmal nocturnal dyspnea, claudication, edema  Respiratory: No cough, sputum production, hemoptysis, DOE Gastrointestinal: No heartburn, dysphagia, abdominal pain, nausea /vomiting, rectal bleeding, melena Genitourinary: No dysuria, hematuria, pyuria, incontinence, nocturia Musculoskeletal: No joint stiffness, joint swelling Dermatologic: No rash, pruritus Neurologic: No dizziness, headache, syncope, seizures, numbness, tingling Psychiatric: No significant anxiety, depression, insomnia, anorexia Endocrine: No change in hair/skin/nails, excessive thirst, excessive hunger, excessive urination  Hematologic/lymphatic: No significant bruising, lymphadenopathy, abnormal bleeding Allergy/immunology: No itchy/watery eyes, significant sneezing, urticaria, angioedema  Physical exam:  Pertinent or positive findings: She is very articulate and communicative.  Slight anisocoria is suggested with a left pupil minimally larger than the right.  She informed me that she does have a heart murmur.  I could not hear such with the stethoscope placed in her isolation room.  Abdomen is protuberant. Pedal pulses are decreased.  The left lower extremity is wrapped.  Weakness to opposition left upper extremity.  Strength was not tested in the lower extremities due to the orthopedic issues.  General appearance:  no acute distress, increased work of breathing is present.   Lymphatic: No lymphadenopathy about the head, neck, axilla. Eyes: No conjunctival inflammation or lid edema is present. There is no scleral icterus. Ears:  External ear exam shows no significant lesions or deformities.   Nose:  External nasal examination shows no deformity or inflammation. Nasal mucosa are pink and moist without lesions, exudates Oral exam: Lips and gums are healthy appearing.There is no oropharyngeal erythema or exudate. Neck:  No thyromegaly, masses, tenderness noted.    Heart:  Normal rate and regular rhythm. S1 and S2 normal  without gallop, murmur, click, rub.  Lungs: Chest clear to auscultation without wheezes, rhonchi, rales, rubs. Abdomen: Bowel sounds are normal.  Abdomen is soft and nontender with no organomegaly, hernias, masses. GU: Deferred  Extremities:  No cyanosis, clubbing, edema. Neurologic exam:  Balance, [place], finger to nose testing could not be completed due to clinical state Skin: Warm & dry w/o tenting. No significant rash.  See clinical summary under each active problem in the Problem List with associated updated therapeutic plan

## 2019-05-02 NOTE — Assessment & Plan Note (Signed)
Clinically sleep apnea well controlled with CPAP

## 2019-05-02 NOTE — Assessment & Plan Note (Signed)
Wound Care nurse will monitor at the SNF

## 2019-05-04 NOTE — Patient Instructions (Signed)
See assessment and plan under each diagnosis in the problem list and acutely for this visit 

## 2019-05-15 DIAGNOSIS — S72451D Displaced supracondylar fracture without intracondylar extension of lower end of right femur, subsequent encounter for closed fracture with routine healing: Secondary | ICD-10-CM | POA: Diagnosis not present

## 2019-05-15 DIAGNOSIS — S8265XD Nondisplaced fracture of lateral malleolus of left fibula, subsequent encounter for closed fracture with routine healing: Secondary | ICD-10-CM | POA: Diagnosis not present

## 2019-05-21 ENCOUNTER — Non-Acute Institutional Stay (SKILLED_NURSING_FACILITY): Payer: Medicare Other | Admitting: Adult Health

## 2019-05-21 ENCOUNTER — Encounter: Payer: Self-pay | Admitting: Adult Health

## 2019-05-21 DIAGNOSIS — L89892 Pressure ulcer of other site, stage 2: Secondary | ICD-10-CM | POA: Diagnosis not present

## 2019-05-21 DIAGNOSIS — B3731 Acute candidiasis of vulva and vagina: Secondary | ICD-10-CM

## 2019-05-21 DIAGNOSIS — S72451S Displaced supracondylar fracture without intracondylar extension of lower end of right femur, sequela: Secondary | ICD-10-CM | POA: Diagnosis not present

## 2019-05-21 DIAGNOSIS — B373 Candidiasis of vulva and vagina: Secondary | ICD-10-CM

## 2019-05-21 DIAGNOSIS — E1169 Type 2 diabetes mellitus with other specified complication: Secondary | ICD-10-CM | POA: Diagnosis not present

## 2019-05-21 DIAGNOSIS — Z794 Long term (current) use of insulin: Secondary | ICD-10-CM | POA: Diagnosis not present

## 2019-05-21 NOTE — Progress Notes (Signed)
Location:  [place] Room Number: 223/A Place of Service:  SNF (31) Provider:  Durenda Age, DNP, FNP-BC  Patient Care Team: Cari Caraway, MD as PCP - General (Family Medicine)  Extended Emergency Contact Information Primary Emergency Contact: [place] Phone: [phone] Relation: Daughter  Code Status:  Full Code  Goals of care: Advanced Directive information Advanced Directives 05/21/2019  Does Patient Have a Medical Advance Directive? Yes  Type of Advance Directive (No Data)  Does patient want to make changes to medical advance directive? No - Patient declined  Would patient like information on creating a medical advance directive? No - Patient declined     Chief Complaint  Patient presents with  . Acute Visit    Palms of hand turning yellow    HPI:  Pt is a 73 y.o. female seen today for an acute visit. It was repor areted that her palms turn yellow. She was seen today in her room. She said that her palms don't turn yellow. When she turns to her side, she holds on to the side rail and the color becomes pale. No noted jaundice on her palms. She verbalized having itch on her vaginal area and has whitish curd discharge. She had diflucan 150 mg orally 2 days ago. She is currently on Doxycycline for surgical site infection. She had a fall at home sustaining  A displaced right distal femur fracture S/P ORIF on 6/25. She has PMH of type 2 diabetes mellitus, hypertension, sleep apnea and morbid obesity.  Past Medical History:  Diagnosis Date  . Anemia   . Anxiety   . Arthritis   . Cataract   . Depression   . Diabetes mellitus without complication ([place])   . Diastolic dysfunction   . Frozen shoulder   . Glaucoma   . Headache    History of migraines  . Heart murmur   . History of kidney stones   . Hyperlipidemia   . Hypertension   . Macular degeneration   . Neuropathy   . Obesity    . Sleep apnea   . Stroke [facility])    has been told by a Dr. Mort Sawyers that she may have had a mini stroke but no further testing was done to confirm  . Tachycardia    Past Surgical History:  Procedure Laterality Date  . CARDIAC CATHETERIZATION     -6/09-normal ejection fraction 65%, no angiographically significant CAD., M. Skains MD  . CESAREAN SECTION    . COLONOSCOPY    . foot spurs removed    . HYSTEROSCOPY W/D&C N/A 06/28/2017   Procedure: DILATATION AND CURETTAGE /HYSTEROSCOPY/POLYPECTOMY WITH MYOSURE;  Surgeon: Christophe Louis, MD;  Location: [facility];  Service: Gynecology;  Laterality: N/A;  . JOINT REPLACEMENT     right knee replaced  . knee spurs removed    . ORIF FEMUR FRACTURE Right 04/19/2019   Procedure: OPEN REDUCTION INTERNAL FIXATION (ORIF) DISTAL FEMUR FRACTURE;  Surgeon: Rod Can, MD;  Location: [facility];  Service: Orthopedics;  Laterality: Right;  . shoulder spurs removed    . TONSILLECTOMY AND ADENOIDECTOMY    . UPPER GI ENDOSCOPY      Allergies  Allergen Reactions  . Meloxicam     Having bleeding  . Percocet [Oxycodone-Acetaminophen] Other (See Comments)    Headache  . Tramadol   . Vicodin [Hydrocodone-Acetaminophen] Other (See Comments)    Headache   .  Azithromycin Rash  . Iodine Rash    Outpatient Encounter Medications as of 05/21/2019  Medication Sig  . acetaminophen (TYLENOL) 325 MG tablet Take 2 tablets (650 mg total) by mouth every 6 (six) hours as needed for mild pain. Do not take more than 4000mg  of tylenol per day  . Amino Acids-Protein Hydrolys (FEEDING SUPPLEMENT, PRO-STAT SUGAR FREE 64,) LIQD Take 30 mLs by mouth 2 (two) times daily.  . B-D INS SYRINGE 0.5CC/31GX5/16 31G X 5/16" 0.5 ML MISC Use as directed per provider.  . Cholecalciferol (VITAMIN D3) 50 MCG (2000 UT) capsule Take 2,000 Units by mouth daily.  . Coenzyme Q10 (CO Q-10) 100 MG CAPS Take 100 mg by mouth at bedtime.   . docusate sodium (COLACE) 100 MG capsule Take 1 capsule (100 mg  total) by mouth 2 (two) times daily.  . dorzolamide-timolol (COSOPT) 22.3-6.8 MG/ML ophthalmic solution Place 1 drop into both eyes 2 (two) times daily.   Marland Kitchen doxycycline (DORYX) 100 MG EC tablet Take 100 mg by mouth 2 (two) times daily. For 14 days  . enoxaparin (LOVENOX) 40 MG/0.4ML injection Inject 0.4 mLs (40 mg total) into the skin daily.  . fluconazole (DIFLUCAN) 150 MG tablet GIVE ONE TAB PO X 1 ON 05/22/2019 XK:GYJEHUD YEAST  . furosemide (LASIX) 20 MG tablet Take 1 tablet (20 mg total) by mouth daily.  . insulin lispro (HUMALOG) 100 UNIT/ML injection Inject into the skin daily. Insulin pump  . INVOKANA 300 MG TABS tablet Take 300 mg by mouth daily.  . Iron-Vitamin C (VITRON-C) 65-125 MG TABS Take 1 tablet by mouth at bedtime.   Marland Kitchen losartan (COZAAR) 100 MG tablet TAKE 1 TABLET DAILY  . metFORMIN (GLUCOPHAGE-XR) 500 MG 24 hr tablet Take 500 mg by mouth at bedtime.   . metoprolol tartrate (LOPRESSOR) 100 MG tablet TAKE 1 TABLET TWICE A DAY  . Multiple Vitamins-Minerals (PRESERVISION AREDS 2 PO) Take 1 tablet by mouth 2 (two) times daily.  . NON FORMULARY REGULAR, HEART HEALTHY - DIABETIC DIET  . ONE TOUCH ULTRA TEST test strip 1 each by Other route as needed (Use as directed per provider).   . polyethylene glycol (MIRALAX / GLYCOLAX) 17 g packet Take 17 g by mouth daily.  Marland Kitchen senna (SENOKOT) 8.6 MG TABS tablet Take 1 tablet (8.6 mg total) by mouth 2 (two) times daily.  . simvastatin (ZOCOR) 40 MG tablet Take 40 mg by mouth every evening.  Marland Kitchen spironolactone (ALDACTONE) 25 MG tablet Take 1 tablet (25 mg total) by mouth daily.  . Travoprost, BAK Free, (TRAVATAN) 0.004 % SOLN ophthalmic solution Place 1 drop into both eyes at bedtime.   . [DISCONTINUED] Amino Acids-Protein Hydrolys (FEEDING SUPPLEMENT, PRO-STAT SUGAR FREE 64,) LIQD Take 30 mLs by mouth daily. (Patient taking differently: Take 30 mLs by mouth 2 (two) times daily. )  . [DISCONTINUED] Ensure Max Protein (ENSURE MAX PROTEIN) LIQD Take  330 mLs (11 oz total) by mouth daily.  . [DISCONTINUED] Garlic 1497 MG CAPS Take 1,000 mg by mouth daily.   . [DISCONTINUED] HYDROmorphone (DILAUDID) 2 MG tablet Take 0.5 tablets (1 mg total) by mouth every 4 (four) hours as needed for severe pain.   No facility-administered encounter medications on file as of 05/21/2019.     Review of Systems  GENERAL: No change in appetite, no fatigue, no weight changes, no fever, chills or weakness MOUTH and THROAT: Denies oral discomfort, gingival pain or bleeding RESPIRATORY: no cough, SOB, DOE, wheezing, hemoptysis CARDIAC: No chest pain, edema  or palpitations GI: No abdominal pain, diarrhea, constipation, heart burn, nausea or vomiting GU: +vaginal itching and discharge NEUROLOGICAL: Denies dizziness, syncope, numbness, or headache PSYCHIATRIC: Denies feelings of depression or anxiety. No report of hallucinations, insomnia, paranoia, or agitation   There is no immunization history on file for this patient. Pertinent  Health Maintenance Due  Topic Date Due  . FOOT EXAM  06/01/2019 (Originally 05/28/1956)  . MAMMOGRAM  06/01/2019 (Originally 05/28/1998)  . OPHTHALMOLOGY EXAM  06/01/2019 (Originally 05/28/1956)  . DEXA SCAN  06/01/2019 (Originally 05/28/2013)  . COLONOSCOPY  06/01/2019 (Originally 05/28/1998)  . PNA vac Low Risk Adult (1 of 2 - PCV13) 06/01/2019 (Originally 05/28/2013)  . INFLUENZA VACCINE  05/26/2019  . HEMOGLOBIN A1C  10/18/2019   Fall Risk  12/16/2016 09/06/2016 06/03/2016 04/01/2016 02/05/2016  Falls in the past year? No No No No No  Number falls in past yr: - - - - -  Injury with Fall? - - - - -  Risk Factor Category  - - - - -  Risk for fall due to : - - - - -  Follow up - - - - -  Comment - - - - -     Vitals:   05/21/19 1415  BP: (!) 144/80  Pulse: 82  Temp: (!) 97.5 F (36.4 C)  TempSrc: Oral  SpO2: 99%  Weight: 232 lb 3.2 oz (105.3 kg)  Height: 4\' 11"  (1.499 m)   Body mass index is 46.9 kg/m.  Physical Exam   GENERAL APPEARANCE: Well nourished. In no acute distress. Morbidly obese SKIN:  Right femur surgical site is dry and no erythema MOUTH and THROAT: Lips are without lesions. Oral mucosa is moist and without lesions. Tongue is normal in shape, size, and color and without lesions RESPIRATORY: Breathing is even & unlabored, BS CTAB CARDIAC: RRR, no murmur,no extra heart sounds, no edema GI: Abdomen soft, normal BS, no masses, no tenderness EXTREMITIES:  Able to move X 4 extremities, left foot with CAM boot NEUROLOGICAL: There is no tremor. Speech is clear. Alert and oriented X 3. PSYCHIATRIC:  Affect and behavior are appropriate  Labs reviewed: Recent Labs    04/20/19 0414 04/21/19 0439 04/26/19 0455 04/29/19 0438  NA 138 139  --  136  K 4.3 4.0  --  4.0  CL 104 104  --  101  CO2 23 25  --  28  GLUCOSE 319* 138*  --  127*  BUN 32* 36*  --  31*  CREATININE 0.74 0.65 0.64 0.59  CALCIUM 8.7* 8.5*  --  9.0    Recent Labs    04/17/19 1437  04/21/19 0439 04/22/19 0446 04/29/19 0438  WBC 15.9*   < > 11.3* 9.6 9.2  NEUTROABS 13.1*  --   --   --   --   HGB 13.4   < > 9.3* 9.0* 9.6*  HCT 41.9   < > 28.8* 29.0* 31.6*  MCV 85.9   < > 88.1 88.7 87.5  PLT 202   < > 173 207 299   < > = values in this interval not displayed.    Lab Results  Component Value Date   HGBA1C 6.9 (H) 04/18/2019   Lab Results  Component Value Date   CHOL 165 09/03/2014   HDL 32.70 (L) 09/03/2014   LDLDIRECT 67.3 09/03/2014   TRIG (H) 09/03/2014    574.0 Triglyceride is over 400; calculations on Lipids are invalid.   CHOLHDL 5 09/03/2014  Significant Diagnostic Results in last 30 days:  Dg Chest Port 1 View  Result Date: 04/29/2019 CLINICAL DATA:  Status post fall today. EXAM: PORTABLE CHEST 1 VIEW COMPARISON:  April 17, 2019 FINDINGS: The heart size and mediastinal contours are within normal limits. Both lungs are clear. The visualized skeletal structures are unremarkable. IMPRESSION: No active  cardiopulmonary disease. Electronically Signed   By: Abelardo Diesel M.D.   On: 04/29/2019 16:44    Assessment/Plan  1. Vaginal yeast infection - will give 1 more dose of Fluconazole 150 mg PO X 1 on 05/22/19  2. Type 2 diabetes mellitus with other specified complication, with long-term current use of insulin (HCC) Lab Results  Component Value Date   HGBA1C 6.9 (H) 04/18/2019  - CBGs are well-controlled, continue Metformin, Humalog insulin pump  3. Closed displaced supracondylar fracture of distal end of right femur without intracondylar extension, sequela - currently on Doxycycline for wound infection, continue Lovenox for DVT prophylaxis     Family/ staff Communication:  Discussed plan of care with resident.  Labs/tests ordered:  None  Goals of care:   Short-term care   Durenda Age, DNP, FNP-BC [facility] [phone] (Monday-Friday 8:00 a.m. - 5:00 p.m.) [phone] (after hours)

## 2019-05-26 DIAGNOSIS — B3731 Acute candidiasis of vulva and vagina: Secondary | ICD-10-CM | POA: Insufficient documentation

## 2019-05-26 DIAGNOSIS — B373 Candidiasis of vulva and vagina: Secondary | ICD-10-CM | POA: Insufficient documentation

## 2019-05-28 ENCOUNTER — Encounter: Payer: Self-pay | Admitting: Adult Health

## 2019-05-28 ENCOUNTER — Non-Acute Institutional Stay (SKILLED_NURSING_FACILITY): Payer: Medicare Other | Admitting: Adult Health

## 2019-05-28 DIAGNOSIS — E7800 Pure hypercholesterolemia, unspecified: Secondary | ICD-10-CM | POA: Diagnosis not present

## 2019-05-28 DIAGNOSIS — E1169 Type 2 diabetes mellitus with other specified complication: Secondary | ICD-10-CM

## 2019-05-28 DIAGNOSIS — I5032 Chronic diastolic (congestive) heart failure: Secondary | ICD-10-CM | POA: Diagnosis not present

## 2019-05-28 DIAGNOSIS — S72451S Displaced supracondylar fracture without intracondylar extension of lower end of right femur, sequela: Secondary | ICD-10-CM

## 2019-05-28 DIAGNOSIS — I10 Essential (primary) hypertension: Secondary | ICD-10-CM | POA: Diagnosis not present

## 2019-05-28 DIAGNOSIS — Z794 Long term (current) use of insulin: Secondary | ICD-10-CM | POA: Diagnosis not present

## 2019-05-28 DIAGNOSIS — L89892 Pressure ulcer of other site, stage 2: Secondary | ICD-10-CM | POA: Diagnosis not present

## 2019-05-28 NOTE — Progress Notes (Signed)
Location:  [place] Room Number: 223/A Place of Service:  SNF (31) Provider:  Durenda Age, DNP, FNP-BC  Patient Care Team: Cari Caraway, MD as PCP - General (Family Medicine)  Extended Emergency Contact Information Primary Emergency Contact: [place] of [place] Phone: [phone] Relation: Daughter  Code Status:  Full Code  Goals of care: Advanced Directive information Advanced Directives 05/28/2019  Does Patient Have a Medical Advance Directive? Yes  Type of Advance Directive (No Data)  Does patient want to make changes to medical advance directive? No - Patient declined  Would patient like information on creating a medical advance directive? No - Patient declined     Chief Complaint  Patient presents with  . Medical Management of Chronic Issues    Routine Visit    HPI:  Pt is a 73 y.o. female seen today for medical management of chronic diseases.  She has PMH of type 2 diabetes mellitus, hypertension, sleep apnea and morbid obesity.  She had been admitted to [place] on 04/30/19 from the hospital S/P fall sustaining a displaced right distal femur fracture S/P ORIF on 6/25.  She continues to have Lovenox injections for DVT prophylaxis. She verbalized that her pain is 1/10. CBGs 88, 123, 143, 102, 111, 158, 109.  She takes Metformin and Humalog insulin pump.  Latest weight 229 lbs.  No SOB. She takes Lasix and Spironolactone for diastolic CHF.    Past Medical History:  Diagnosis Date  . Anemia   . Anxiety   . Arthritis   . Cataract   . Depression   . Diabetes mellitus without complication ([place])   . Diastolic dysfunction   . Frozen shoulder   . Glaucoma   . Headache    History of migraines  . Heart murmur   . History of kidney stones   . Hyperlipidemia   . Hypertension   . Macular degeneration   . Neuropathy   . Obesity   . Sleep apnea   . Stroke [facility])     has been told by a Dr. Mort Sawyers that she may have had a mini stroke but no further testing was done to confirm  . Tachycardia    Past Surgical History:  Procedure Laterality Date  . CARDIAC CATHETERIZATION     -6/09-normal ejection fraction 65%, no angiographically significant CAD., M. Skains MD  . CESAREAN SECTION    . COLONOSCOPY    . foot spurs removed    . HYSTEROSCOPY W/D&C N/A 06/28/2017   Procedure: DILATATION AND CURETTAGE /HYSTEROSCOPY/POLYPECTOMY WITH MYOSURE;  Surgeon: Christophe Louis, MD;  Location: [facility];  Service: Gynecology;  Laterality: N/A;  . JOINT REPLACEMENT     right knee replaced  . knee spurs removed    . ORIF FEMUR FRACTURE Right 04/19/2019   Procedure: OPEN REDUCTION INTERNAL FIXATION (ORIF) DISTAL FEMUR FRACTURE;  Surgeon: Rod Can, MD;  Location: [facility];  Service: Orthopedics;  Laterality: Right;  . shoulder spurs removed    . TONSILLECTOMY AND ADENOIDECTOMY    . UPPER GI ENDOSCOPY      Allergies  Allergen Reactions  . Meloxicam     Having bleeding  . Percocet [Oxycodone-Acetaminophen] Other (See Comments)    Headache  . Tramadol   . Vicodin [Hydrocodone-Acetaminophen] Other (See Comments)    Headache   . Azithromycin Rash  . Iodine Rash    Outpatient Encounter Medications as of  05/28/2019  Medication Sig  . acetaminophen (TYLENOL) 325 MG tablet Take 2 tablets (650 mg total) by mouth every 6 (six) hours as needed for mild pain. Do not take more than 4000mg  of tylenol per day  . Amino Acids-Protein Hydrolys (FEEDING SUPPLEMENT, PRO-STAT SUGAR FREE 64,) LIQD Take 30 mLs by mouth 2 (two) times daily.  . B-D INS SYRINGE 0.5CC/31GX5/16 31G X 5/16" 0.5 ML MISC Use as directed per provider.  . Cholecalciferol (VITAMIN D3) 50 MCG (2000 UT) capsule Take 2,000 Units by mouth daily.  . Coenzyme Q10 (CO Q-10) 100 MG CAPS Take 100 mg by mouth at bedtime.   . docusate sodium (COLACE) 100 MG capsule Take 1 capsule (100 mg total) by mouth 2 (two) times daily.   . dorzolamide-timolol (COSOPT) 22.3-6.8 MG/ML ophthalmic solution Place 1 drop into both eyes 2 (two) times daily.   . [EXPIRED] doxycycline (DORYX) 100 MG EC tablet Take 100 mg by mouth 2 (two) times daily. For 14 days  . enoxaparin (LOVENOX) 40 MG/0.4ML injection Inject 0.4 mLs (40 mg total) into the skin daily.  . furosemide (LASIX) 20 MG tablet Take 1 tablet (20 mg total) by mouth daily.  . insulin lispro (HUMALOG) 100 UNIT/ML injection Inject into the skin daily. Insulin pump  . INVOKANA 300 MG TABS tablet Take 300 mg by mouth daily.  . Iron-Vitamin C (VITRON-C) 65-125 MG TABS Take 1 tablet by mouth at bedtime.   Marland Kitchen losartan (COZAAR) 100 MG tablet TAKE 1 TABLET DAILY  . metFORMIN (GLUCOPHAGE-XR) 500 MG 24 hr tablet Take 500 mg by mouth at bedtime.   . metoprolol tartrate (LOPRESSOR) 100 MG tablet TAKE 1 TABLET TWICE A DAY  . Multiple Vitamins-Minerals (PRESERVISION AREDS 2 PO) Take 1 tablet by mouth 2 (two) times daily.  . NON FORMULARY REGULAR, HEART HEALTHY - DIABETIC DIET  . ONE TOUCH ULTRA TEST test strip 1 each by Other route as needed (Use as directed per provider).   . polyethylene glycol (MIRALAX / GLYCOLAX) 17 g packet Take 17 g by mouth daily.  Marland Kitchen senna (SENOKOT) 8.6 MG TABS tablet Take 1 tablet (8.6 mg total) by mouth 2 (two) times daily.  . simvastatin (ZOCOR) 40 MG tablet Take 40 mg by mouth every evening.  Marland Kitchen spironolactone (ALDACTONE) 25 MG tablet Take 1 tablet (25 mg total) by mouth daily.  . Travoprost, BAK Free, (TRAVATAN) 0.004 % SOLN ophthalmic solution Place 1 drop into both eyes at bedtime.   . [DISCONTINUED] fluconazole (DIFLUCAN) 150 MG tablet GIVE ONE TAB PO X 1 ON 05/22/2019 WN:IOEVOJJ YEAST   No facility-administered encounter medications on file as of 05/28/2019.     Review of Systems  GENERAL: No change in appetite, no fatigue, no weight changes, no fever, chills or weakness MOUTH and THROAT: Denies oral discomfort, gingival pain or bleeding, pain from teeth or  hoarseness   RESPIRATORY: no cough, SOB, DOE, wheezing, hemoptysis CARDIAC: No chest pain, edema or palpitations GI: No abdominal pain, diarrhea, constipation, heart burn, nausea or vomiting GU: Denies dysuria, frequency, hematuria, incontinence, or disch same day arge NEUROLOGICAL: Denies dizziness, syncope, numbness, or headache PSYCHIATRIC: Denies feelings of depression or anxiety. No report of hallucinations, insomnia, paranoia, or agitation   There is no immunization history on file for this patient. Pertinent  Health Maintenance Due  Topic Date Due  . FOOT EXAM  05/28/1956  . OPHTHALMOLOGY EXAM  05/28/1956  . MAMMOGRAM  05/28/1998  . COLONOSCOPY  05/28/1998  . DEXA SCAN  05/28/2013  .  PNA vac Low Risk Adult (1 of 2 - PCV13) 05/28/2013  . INFLUENZA VACCINE  06/28/2019 (Originally 05/26/2019)  . HEMOGLOBIN A1C  10/18/2019   Fall Risk  12/16/2016 09/06/2016 06/03/2016 04/01/2016 02/05/2016  Falls in the past year? No No No No No  Number falls in past yr: - - - - -  Injury with Fall? - - - - -  Risk Factor Category  - - - - -  Risk for fall due to : - - - - -  Follow up - - - - -  Comment - - - - -     Vitals:   05/28/19 0932  BP: (!) 155/80  Pulse: 78  Resp: 18  Temp: (!) 97 F (36.1 C)  TempSrc: Oral  SpO2: 97%  Weight: 229 lb (103.9 kg)  Height: 4\' 11"  (1.499 m)   Body mass index is 46.25 kg/m.  Physical Exam  GENERAL APPEARANCE: Well nourished. In no acute distress. Morbidly obese SKIN:  Right femur surgical site with steri-strips, dry and no erythema MOUTH and THROAT: Lips are without lesions. Oral mucosa is moist and without lesions. Tongue is normal in shape, size, and color and without lesions RESPIRATORY: Breathing is even & unlabored, BS CTAB CARDIAC: RRR, no murmur,no extra heart sounds, no edema GI: Abdomen soft, normal BS, no masses, no tenderness EXTREMITIES:  Able to move X 4 extremities NEUROLOGICAL: There is no tremor. Speech is clear. Alert and  oriented X 3. PSYCHIATRIC:  Affect and behavior are appropriate  Labs reviewed: Recent Labs    04/20/19 0414 04/21/19 0439 04/26/19 0455 04/29/19 0438  NA 138 139  --  136  K 4.3 4.0  --  4.0  CL 104 104  --  101  CO2 23 25  --  28  GLUCOSE 319* 138*  --  127*  BUN 32* 36*  --  31*  CREATININE 0.74 0.65 0.64 0.59  CALCIUM 8.7* 8.5*  --  9.0    Recent Labs    04/17/19 1437  04/21/19 0439 04/22/19 0446 04/29/19 0438  WBC 15.9*   < > 11.3* 9.6 9.2  NEUTROABS 13.1*  --   --   --   --   HGB 13.4   < > 9.3* 9.0* 9.6*  HCT 41.9   < > 28.8* 29.0* 31.6*  MCV 85.9   < > 88.1 88.7 87.5  PLT 202   < > 173 207 299   < > = values in this interval not displayed.   No results found for: TSH Lab Results  Component Value Date   HGBA1C 6.9 (H) 04/18/2019   Lab Results  Component Value Date   CHOL 165 09/03/2014   HDL 32.70 (L) 09/03/2014   LDLDIRECT 67.3 09/03/2014   TRIG (H) 09/03/2014    574.0 Triglyceride is over 400; calculations on Lipids are invalid.   CHOLHDL 5 09/03/2014    Assessment/Plan  1. Closed displaced supracondylar fracture of distal end of right femur without intracondylar extension, sequela - S/P ORIF, continue Lovenox injection for DVT prophylaxis, continue Doxycycline for surgical wound infection, continue PT and OT for therapeutic strengthening exercises  2. Type 2 diabetes mellitus with other specified complication, with long-term current use of insulin (HCC) Lab Results  Component Value Date   HGBA1C 6.9 (H) 04/18/2019  - will follow-up with endocrinology, Dr. Chalmers Cater, continue Humalog insulin pump, Invokana and metformin  3. Chronic diastolic congestive heart failure (HCC) - no SOB, continue Lasix, Spironolactone and  metoprolol titrate  4. Essential hypertension, benign -Well-controlled, continue losartan 100 mg 1 tab daily  5. Pure hypercholesterolemia Lab Results  Component Value Date   CHOL 165 09/03/2014   HDL 32.70 (L) 09/03/2014    LDLDIRECT 67.3 09/03/2014   TRIG (H) 09/03/2014    574.0 Triglyceride is over 400; calculations on Lipids are invalid.   CHOLHDL 5 09/03/2014  -Continue simvastatin     Family/ staff Communication: Discussed plan of care with resident.  Labs/tests ordered: None  Goals of care:  Short-term care   Durenda Age, DNP, FNP-BC [facility] [phone] (Monday-Friday 8:00 a.m. - 5:00 p.m.) [phone] (after hours)

## 2019-05-29 DIAGNOSIS — S72451D Displaced supracondylar fracture without intracondylar extension of lower end of right femur, subsequent encounter for closed fracture with routine healing: Secondary | ICD-10-CM | POA: Diagnosis not present

## 2019-05-29 DIAGNOSIS — M1712 Unilateral primary osteoarthritis, left knee: Secondary | ICD-10-CM | POA: Diagnosis not present

## 2019-05-29 DIAGNOSIS — S8265XD Nondisplaced fracture of lateral malleolus of left fibula, subsequent encounter for closed fracture with routine healing: Secondary | ICD-10-CM | POA: Diagnosis not present

## 2019-05-30 DIAGNOSIS — G609 Hereditary and idiopathic neuropathy, unspecified: Secondary | ICD-10-CM | POA: Diagnosis not present

## 2019-05-30 DIAGNOSIS — E669 Obesity, unspecified: Secondary | ICD-10-CM | POA: Diagnosis not present

## 2019-05-30 DIAGNOSIS — Z9641 Presence of insulin pump (external) (internal): Secondary | ICD-10-CM | POA: Diagnosis not present

## 2019-05-30 DIAGNOSIS — I10 Essential (primary) hypertension: Secondary | ICD-10-CM | POA: Diagnosis not present

## 2019-05-30 DIAGNOSIS — E7800 Pure hypercholesterolemia, unspecified: Secondary | ICD-10-CM | POA: Diagnosis not present

## 2019-05-30 DIAGNOSIS — E1165 Type 2 diabetes mellitus with hyperglycemia: Secondary | ICD-10-CM | POA: Diagnosis not present

## 2019-06-04 DIAGNOSIS — L89892 Pressure ulcer of other site, stage 2: Secondary | ICD-10-CM | POA: Diagnosis not present

## 2019-06-19 ENCOUNTER — Encounter: Payer: Self-pay | Admitting: Adult Health

## 2019-06-19 ENCOUNTER — Non-Acute Institutional Stay (SKILLED_NURSING_FACILITY): Payer: Medicare Other | Admitting: Adult Health

## 2019-06-19 DIAGNOSIS — I5032 Chronic diastolic (congestive) heart failure: Secondary | ICD-10-CM

## 2019-06-19 DIAGNOSIS — S72391S Other fracture of shaft of right femur, sequela: Secondary | ICD-10-CM | POA: Diagnosis not present

## 2019-06-19 DIAGNOSIS — Z794 Long term (current) use of insulin: Secondary | ICD-10-CM

## 2019-06-19 DIAGNOSIS — E1169 Type 2 diabetes mellitus with other specified complication: Secondary | ICD-10-CM | POA: Diagnosis not present

## 2019-06-19 DIAGNOSIS — L905 Scar conditions and fibrosis of skin: Secondary | ICD-10-CM | POA: Diagnosis not present

## 2019-06-19 NOTE — Progress Notes (Signed)
Location:  [place] Room Number: 223/A Place of Service:  SNF (31) Provider:  Durenda Age, DNP, FNP-BC  Patient Care Team: Cari Caraway, MD as PCP - General (Family Medicine)  Extended Emergency Contact Information Primary Emergency Contact: [place] of [place] Phone: [phone] Relation: Daughter  Code Status: Full Code   Goals of care: Advanced Directive information Advanced Directives 06/19/2019  Does Patient Have a Medical Advance Directive? Yes  Type of Advance Directive (No Data)  Does patient want to make changes to medical advance directive? No - Patient declined  Would patient like information on creating a medical advance directive? No - Patient declined     Chief Complaint  Patient presents with  . Medical Management of Chronic Issues    Routine Visit    HPI:  Pt is a 73 y.o. female seen today for medical management of chronic diseases. She has PMH of type 2 diabetes mellitus, hypertension, sleep apnea and morbid obesity.. She is currently having a short-term rehabilitation after being admitted to [facility] on 04/30/19. She was seen in her room today. She has insulin pump and verbalized that she had just given herself 3 units of Humalog Insulin. She recently followed-up with her endocrinologist. Her CBGs are ranging from 60-188. Right femur surgical site has healed. She bought Merigold at [employer] and requested for it to be applied on her right thigh surgical scar. Latest weight is 228.4 lbs. She said that she is currently on a 1200 calorie diet everyday. She has been on the diet since May 2020 and her weight then was 260 lbs (according to her). No reported SOB. She takes Lasix and Spironolactone for diastolic heart failure. She requested to be weighed weekly.    Past Medical History:  Diagnosis Date  . Anemia   . Anxiety   . Arthritis   . Cataract   .  Depression   . Diabetes mellitus without complication ([place])   . Diastolic dysfunction   . Frozen shoulder   . Glaucoma   . Headache    History of migraines  . Heart murmur   . History of kidney stones   . Hyperlipidemia   . Hypertension   . Macular degeneration   . Neuropathy   . Obesity   . Sleep apnea   . Stroke [facility])    has been told by a Dr. Mort Sawyers that she may have had a mini stroke but no further testing was done to confirm  . Tachycardia    Past Surgical History:  Procedure Laterality Date  . CARDIAC CATHETERIZATION     -6/09-normal ejection fraction 65%, no angiographically significant CAD., M. Skains MD  . CESAREAN SECTION    . COLONOSCOPY    . foot spurs removed    . HYSTEROSCOPY W/D&C N/A 06/28/2017   Procedure: DILATATION AND CURETTAGE /HYSTEROSCOPY/POLYPECTOMY WITH MYOSURE;  Surgeon: Christophe Louis, MD;  Location: [facility];  Service: Gynecology;  Laterality: N/A;  . JOINT REPLACEMENT     right knee replaced  . knee spurs removed    . ORIF FEMUR FRACTURE Right 04/19/2019   Procedure: OPEN REDUCTION INTERNAL FIXATION (ORIF) DISTAL FEMUR FRACTURE;  Surgeon: Rod Can, MD;  Location: [facility];  Service: Orthopedics;  Laterality: Right;  . shoulder spurs removed    . TONSILLECTOMY AND ADENOIDECTOMY    . UPPER GI ENDOSCOPY      Allergies  Allergen  Reactions  . Meloxicam     Having bleeding  . Percocet [Oxycodone-Acetaminophen] Other (See Comments)    Headache  . Tramadol   . Vicodin [Hydrocodone-Acetaminophen] Other (See Comments)    Headache   . Azithromycin Rash  . Iodine Rash    Outpatient Encounter Medications as of 06/19/2019  Medication Sig  . acetaminophen (TYLENOL) 325 MG tablet Take 2 tablets (650 mg total) by mouth every 6 (six) hours as needed for mild pain. Do not take more than 4000mg  of tylenol per day  . B-D INS SYRINGE 0.5CC/31GX5/16 31G X 5/16" 0.5 ML MISC Use as directed per provider.  . Cholecalciferol (VITAMIN D3) 50 MCG (2000 UT)  capsule Take 2,000 Units by mouth daily.  . Coenzyme Q10 (CO Q-10) 100 MG CAPS Take 100 mg by mouth at bedtime.   Marland Kitchen DIMETHICONE, TOPICAL, 2 % CREA MEDERMA CREAM Apply topically to right hip to knee scar Qhs  . docusate sodium (COLACE) 100 MG capsule Take 1 capsule (100 mg total) by mouth 2 (two) times daily.  . dorzolamide-timolol (COSOPT) 22.3-6.8 MG/ML ophthalmic solution Place 1 drop into both eyes 2 (two) times daily.   . furosemide (LASIX) 20 MG tablet Take 1 tablet (20 mg total) by mouth daily.  . insulin lispro (HUMALOG) 100 UNIT/ML injection Inject into the skin daily. Insulin pump  . INVOKANA 300 MG TABS tablet Take 300 mg by mouth daily.  . Iron-Vitamin C (VITRON-C) 65-125 MG TABS Take 1 tablet by mouth at bedtime.   Marland Kitchen losartan (COZAAR) 100 MG tablet TAKE 1 TABLET DAILY  . metFORMIN (GLUCOPHAGE-XR) 500 MG 24 hr tablet Take 500 mg by mouth at bedtime.   . metoprolol tartrate (LOPRESSOR) 100 MG tablet TAKE 1 TABLET TWICE A DAY  . Multiple Vitamins-Minerals (PRESERVISION AREDS 2 PO) Take 1 tablet by mouth 2 (two) times daily.  . NON FORMULARY REGULAR, HEART HEALTHY - DIABETIC DIET  . ONE TOUCH ULTRA TEST test strip 1 each by Other route as needed (Use as directed per provider).   . polyethylene glycol (MIRALAX / GLYCOLAX) 17 g packet Take 17 g by mouth daily.  Marland Kitchen senna (SENOKOT) 8.6 MG TABS tablet Take 1 tablet (8.6 mg total) by mouth 2 (two) times daily.  . simvastatin (ZOCOR) 40 MG tablet Take 40 mg by mouth every evening.  Marland Kitchen spironolactone (ALDACTONE) 25 MG tablet Take 1 tablet (25 mg total) by mouth daily.  . Travoprost, BAK Free, (TRAVATAN) 0.004 % SOLN ophthalmic solution Place 1 drop into both eyes at bedtime.   . [DISCONTINUED] Amino Acids-Protein Hydrolys (FEEDING SUPPLEMENT, PRO-STAT SUGAR FREE 64,) LIQD Take 30 mLs by mouth 2 (two) times daily.  . [DISCONTINUED] enoxaparin (LOVENOX) 40 MG/0.4ML injection Inject 0.4 mLs (40 mg total) into the skin daily.   No  facility-administered encounter medications on file as of 06/19/2019.     Review of Systems  GENERAL: No change in appetite, no fatigue, no fever, chills or weakness MOUTH and THROAT: Denies oral discomfort, gingival pain or bleeding RESPIRATORY: no cough, SOB, DOE, wheezing, hemoptysis CARDIAC: No chest pain, edema or palpitations GI: No abdominal pain, diarrhea, constipation, heart burn, nausea or vomiting GU: Denies dysuria, frequency, hematuria, incontinence, or discharge NEUROLOGICAL: Denies dizziness, syncope, numbness, or headache PSYCHIATRIC: Denies feelings of depression or anxiety. No report of hallucinations, insomnia, paranoia, or agitation    There is no immunization history on file for this patient. Pertinent  Health Maintenance Due  Topic Date Due  . FOOT EXAM  05/28/1956  .  OPHTHALMOLOGY EXAM  05/28/1958  . MAMMOGRAM  05/28/1998  . COLONOSCOPY  05/28/1998  . DEXA SCAN  05/28/2013  . PNA vac Low Risk Adult (1 of 2 - PCV13) 05/28/2013  . INFLUENZA VACCINE  06/28/2019 (Originally 05/26/2019)  . HEMOGLOBIN A1C  10/18/2019   Fall Risk  12/16/2016 09/06/2016 06/03/2016 04/01/2016 02/05/2016  Falls in the past year? No No No No No  Number falls in past yr: - - - - -  Injury with Fall? - - - - -  Risk Factor Category  - - - - -  Risk for fall due to : - - - - -  Follow up - - - - -  Comment - - - - -     Vitals:   06/19/19 1229  BP: 128/69  Pulse: 83  Resp: 18  Temp: 97.6 F (36.4 C)  TempSrc: Oral  SpO2: 98%  Weight: 228 lb 6.4 oz (103.6 kg)  Height: 4\' 11"  (1.499 m)   Body mass index is 46.13 kg/m.  Physical Exam  GENERAL APPEARANCE: Well nourished. In no acute distress. Morbidly obese. SKIN:  Right hip surgical site is healed, with scar MOUTH and THROAT: Lips are without lesions. Oral mucosa is moist and without lesions. Tongue is normal in shape, size, and color and without lesions RESPIRATORY: Breathing is even & unlabored, BS CTAB CARDIAC: RRR, no  murmur,no extra heart sounds, no edema GI: Abdomen soft, normal BS, no masses, no tenderness EXTREMITIES:  Able to move X 4 extremities. NEUROLOGICAL: There is no tremor. Speech is clear. Alert and oriented X 3. PSYCHIATRIC:  Affect and behavior are appropriate  Labs reviewed: Recent Labs    04/20/19 0414 04/21/19 0439 04/26/19 0455 04/29/19 0438  NA 138 139  --  136  K 4.3 4.0  --  4.0  CL 104 104  --  101  CO2 23 25  --  28  GLUCOSE 319* 138*  --  127*  BUN 32* 36*  --  31*  CREATININE 0.74 0.65 0.64 0.59  CALCIUM 8.7* 8.5*  --  9.0    Recent Labs    04/17/19 1437  04/21/19 0439 04/22/19 0446 04/29/19 0438  WBC 15.9*   < > 11.3* 9.6 9.2  NEUTROABS 13.1*  --   --   --   --   HGB 13.4   < > 9.3* 9.0* 9.6*  HCT 41.9   < > 28.8* 29.0* 31.6*  MCV 85.9   < > 88.1 88.7 87.5  PLT 202   < > 173 207 299   < > = values in this interval not displayed.   No results found for: TSH Lab Results  Component Value Date   HGBA1C 6.9 (H) 04/18/2019   Lab Results  Component Value Date   CHOL 165 09/03/2014   HDL 32.70 (L) 09/03/2014   LDLDIRECT 67.3 09/03/2014   TRIG (H) 09/03/2014    574.0 Triglyceride is over 400; calculations on Lipids are invalid.   CHOLHDL 5 09/03/2014      Assessment/Plan  1. Chronic diastolic congestive heart failure (HCC) - no SOB, continue, Metoprolol tartrate, Lasix and Spironolactone  2. Other closed fracture of shaft of right femur, sequela - S/P surgical repair, continue PT and OT for therapeutic strengthening exercises, fall precautions  3. Type 2 diabetes mellitus with other specified complication, with long-term current use of insulin (HCC) Lab Results  Component Value Date   HGBA1C 6.9 (H) 04/18/2019  - well-controlled, continue Humalog  Insulin pump, Metformin and Invokana  4. Scar  - will start Mederma cream topically at [facility]    Family/ staff Communication:  Discussed plan of care with resident.  Labs/tests ordered:  None   Goals of care:  Long-term care    Durenda Age, DNP, FNP-BC [facility] [phone] (Monday-Friday 8:00 a.m. - 5:00 p.m.) [phone] (after hours)

## 2019-07-10 DIAGNOSIS — S72451D Displaced supracondylar fracture without intracondylar extension of lower end of right femur, subsequent encounter for closed fracture with routine healing: Secondary | ICD-10-CM | POA: Diagnosis not present

## 2019-07-10 DIAGNOSIS — S8265XD Nondisplaced fracture of lateral malleolus of left fibula, subsequent encounter for closed fracture with routine healing: Secondary | ICD-10-CM | POA: Diagnosis not present

## 2019-07-19 ENCOUNTER — Encounter: Payer: Self-pay | Admitting: Adult Health

## 2019-07-19 NOTE — Progress Notes (Signed)
This encounter was created in error - please disregard.

## 2019-07-20 ENCOUNTER — Non-Acute Institutional Stay (SKILLED_NURSING_FACILITY): Payer: Medicare Other | Admitting: Adult Health

## 2019-07-20 ENCOUNTER — Encounter: Payer: Self-pay | Admitting: Adult Health

## 2019-07-20 DIAGNOSIS — E785 Hyperlipidemia, unspecified: Secondary | ICD-10-CM

## 2019-07-20 DIAGNOSIS — Z794 Long term (current) use of insulin: Secondary | ICD-10-CM | POA: Diagnosis not present

## 2019-07-20 DIAGNOSIS — R197 Diarrhea, unspecified: Secondary | ICD-10-CM

## 2019-07-20 DIAGNOSIS — I5032 Chronic diastolic (congestive) heart failure: Secondary | ICD-10-CM | POA: Diagnosis not present

## 2019-07-20 DIAGNOSIS — S72391S Other fracture of shaft of right femur, sequela: Secondary | ICD-10-CM | POA: Diagnosis not present

## 2019-07-20 DIAGNOSIS — I10 Essential (primary) hypertension: Secondary | ICD-10-CM | POA: Diagnosis not present

## 2019-07-20 DIAGNOSIS — E1169 Type 2 diabetes mellitus with other specified complication: Secondary | ICD-10-CM

## 2019-07-20 NOTE — Progress Notes (Signed)
Location:  [place] Room Number: [id number] Place of Service:  SNF (31) Provider:  Durenda Age, DNP, FNP-BC  Patient Care Team: Cari Caraway, MD as PCP - General (Family Medicine)  Extended Emergency Contact Information Primary Emergency Contact: [place] of [place] Phone: [phone] Relation: Daughter  Code Status:  Full Code  Goals of care: Advanced Directive information Advanced Directives 06/19/2019  Does Patient Have a Medical Advance Directive? Yes  Type of Advance Directive (No Data)  Does patient want to make changes to medical advance directive? No - Patient declined  Would patient like information on creating a medical advance directive? No - Patient declined     Chief Complaint  Patient presents with  . Medical Management of Chronic Issues    Routine [facility] visit    HPI:  Pt is a 73 y.o. [name] seen today for medical management of chronic diseases. She is a short-term rehabilitation resident of [name]. She has a PMH of type 2 DM, HTN, sleep apnea, and morbid obesity. She was seen in her room.  She reported moving her bowels x2 today and thinks it is due to her nerves.  She verbalized feeling so emotional since she will be going home in a week. She claims not having hugged her family in months due to COVID-19 pandemic.  CBGs 103, 150, 134, 184, 107, 154.  She is currently taking metformin Humalog insulin pump and Invokana.  She denies having any pain.   Past Medical History:  Diagnosis Date  . Anemia   . Anxiety   . Arthritis   . Cataract   . Depression   . Diabetes mellitus without complication ([place])   . Diastolic dysfunction   . Frozen shoulder   . Glaucoma   . Headache    History of migraines  . Heart murmur   . History of kidney stones   . Hyperlipidemia   . Hypertension   . Macular degeneration   . Neuropathy   . Obesity   . Sleep  apnea   . Stroke [name])    has been told by a Dr. Mort Sawyers that she may have had a mini stroke but no further testing was done to confirm  . Tachycardia    Past Surgical History:  Procedure Laterality Date  . CARDIAC CATHETERIZATION     -6/09-normal ejection fraction 65%, no angiographically significant CAD., M. Skains MD  . CESAREAN SECTION    . COLONOSCOPY    . foot spurs removed    . HYSTEROSCOPY W/D&C N/A 9/Jessica/2018   Procedure: DILATATION AND CURETTAGE /HYSTEROSCOPY/POLYPECTOMY WITH MYOSURE;  Surgeon: Christophe Louis, MD;  Location: [facility];  Service: Gynecology;  Laterality: N/A;  . JOINT REPLACEMENT     right knee replaced  . knee spurs removed    . ORIF FEMUR FRACTURE Right 04/19/2019   Procedure: OPEN REDUCTION INTERNAL FIXATION (ORIF) DISTAL FEMUR FRACTURE;  Surgeon: Rod Can, MD;  Location: [facility];  Service: Orthopedics;  Laterality: Right;  . shoulder spurs removed    . TONSILLECTOMY AND ADENOIDECTOMY    . UPPER GI ENDOSCOPY      Allergies  Allergen Reactions  . Meloxicam     Having bleeding  . Percocet [Oxycodone-Acetaminophen] Other (See Comments)    Headache  . Tramadol   . Vicodin [Hydrocodone-Acetaminophen] Other (See Comments)    Headache   . Azithromycin Rash  . Iodine  Rash    Outpatient Encounter Medications as of 07/20/2019  Medication Sig  . acetaminophen (TYLENOL) 325 MG tablet Take 2 tablets (650 mg total) by mouth every 6 (six) hours as needed for mild pain. Do not take more than 4000mg  of tylenol per day  . Cholecalciferol (VITAMIN D3) 50 MCG (2000 UT) capsule Take 2,000 Units by mouth daily.  . Coenzyme Q10 (CO Q-10) 100 MG CAPS Take 100 mg by mouth at bedtime.   Marland Kitchen DIMETHICONE, TOPICAL, 2 % CREA MEDERMA CREAM Apply topically to right hip to knee scar Qhs  . dorzolamide-timolol (COSOPT) 22.3-6.8 MG/ML ophthalmic solution Place 1 drop into both eyes 2 (two) times daily.   . furosemide (LASIX) 20 MG tablet Take 1 tablet (20 mg total) by mouth daily.   . insulin lispro (HUMALOG) 100 UNIT/ML injection Inject into the skin daily. Insulin pump  . INVOKANA 300 MG TABS tablet Take 300 mg by mouth daily.  . Iron-Vitamin C (VITRON-C) 65-125 MG TABS Take 1 tablet by mouth at bedtime.   Marland Kitchen losartan (COZAAR) 100 MG tablet TAKE 1 TABLET DAILY  . metFORMIN (GLUCOPHAGE-XR) 500 MG 24 hr tablet Take 500 mg by mouth at bedtime.   . metoprolol tartrate (LOPRESSOR) 100 MG tablet TAKE 1 TABLET TWICE A DAY  . Multiple Vitamins-Minerals (PRESERVISION AREDS 2 PO) Take 1 tablet by mouth 2 (two) times daily.  . NON FORMULARY REGULAR, HEART HEALTHY - DIABETIC DIET  . polyethylene glycol (MIRALAX / GLYCOLAX) 17 g packet Take 17 g by mouth daily.  . simvastatin (ZOCOR) 40 MG tablet Take 40 mg by mouth every evening.  Marland Kitchen spironolactone (ALDACTONE) 25 MG tablet Take 1 tablet (25 mg total) by mouth daily.  . Travoprost, BAK Free, (TRAVATAN) 0.004 % SOLN ophthalmic solution Place 1 drop into both eyes at bedtime.   . B-D INS SYRINGE 0.5CC/31GX5/16 31G X 5/16" 0.5 ML MISC Use as directed per provider.  . ONE TOUCH ULTRA TEST test strip 1 each by Other route as needed (Use as directed per provider).   . [DISCONTINUED] docusate sodium (COLACE) 100 MG capsule Take 1 capsule (100 mg total) by mouth 2 (two) times daily.  . [DISCONTINUED] senna (SENOKOT) 8.6 MG TABS tablet Take 1 tablet (8.6 mg total) by mouth 2 (two) times daily.   No facility-administered encounter medications on file as of 07/20/2019.     Review of Systems  GENERAL: No change in appetite, no fatigue, no weight changes, no fever, chills or weakness MOUTH and THROAT: Denies oral discomfort, gingival pain or bleeding, pain from teeth or hoarseness   RESPIRATORY: no cough, SOB, DOE, wheezing, hemoptysis CARDIAC: No chest pain or palpitations GI: +diarrhea GU: Denies dysuria, frequency, hematuria, incontinence, or discharge NEUROLOGICAL: Denies dizziness, syncope, numbness, or headache PSYCHIATRIC: Denies  feelings of depression or anxiety. No report of hallucinations, insomnia, paranoia, or agitation    Immunization History  Administered Date(s) Administered  . Influenza-Unspecified 06/25/2018   Pertinent  Health Maintenance Due  Topic Date Due  . MAMMOGRAM  05/28/1998  . COLONOSCOPY  05/28/1998  . DEXA SCAN  05/28/2013  . PNA vac Low Risk Adult (1 of 2 - PCV13) 05/28/2013  . INFLUENZA VACCINE  05/26/2019  . FOOT EXAM  10/26/2019 (Originally [birth year]Jessica/1957)  . OPHTHALMOLOGY EXAM  10/26/2019 (Originally [birth year]Jessica/1957)  . HEMOGLOBIN A1C  10/18/2019   Fall Risk  12/16/2016 09/06/2016 06/03/2016 04/01/2016 Jessica/13/2017  Falls in the past year? No No No No No  Number falls in past yr: - - - - -  Injury with Fall? - - - - -  Risk Factor Category  - - - - -  Risk for fall due to : - - - - -  Follow up - - - - -  Comment - - - - -     Vitals:   07/20/19 0903  BP: 132/68  Pulse: 88  Resp: 18  Temp: 97.8 F (36.6 C)  TempSrc: Oral  SpO2: 97%  Weight: 222 lb 3.2 oz (100.8 kg)  Height: Jessica\' 11"  (1.499 m)   Body mass index is 44.88 kg/m.  Physical Exam  GENERAL APPEARANCE: Well nourished. In no acute distress. Morbidly obese. SKIN:  Skin is warm and dry.  MOUTH and THROAT: Lips are without lesions. Oral mucosa is moist and without lesions. Tongue is normal in shape, size, and color and without lesions RESPIRATORY: Breathing is even & unlabored, BS CTAB CARDIAC: RRR, no murmur,no extra heart sounds, BLE 1-2+ edema GI: Abdomen soft, normal BS, no masses, no tenderness EXTREMITIES:  Able to move X Jessica extremities. NEUROLOGICAL: There is no tremor. Speech is clear. Alert and oriented X 3. PSYCHIATRIC:  Affect and behavior are appropriate  Labs reviewed: Recent Labs    04/20/19 0414 04/21/19 0439 04/26/19 0455 04/29/19 0438  NA 138 139  --  136  K Jessica.3 Jessica.0  --  Jessica.0  CL 104 104  --  101  CO2 23 25  --  28  GLUCOSE 319* 138*  --  127*  BUN 32* 36*  --  31*  CREATININE 0.74 0.65 0.64 0.59   CALCIUM 8.7* 8.5*  --  9.0    Recent Labs    04/17/19 1437  04/21/19 0439 04/22/19 0446 04/29/19 0438  WBC 15.9*   < > 11.3* 9.6 9.2  NEUTROABS 13.1*  --   --   --   --   HGB 13.Jessica   < > 9.3* 9.0* 9.6*  HCT 41.9   < > 28.8* 29.0* 31.6*  MCV 85.9   < > 88.1 88.7 87.5  PLT 202   < > 173 207 299   < > = values in this interval not displayed.   No results found for: TSH Lab Results  Component Value Date   HGBA1C 6.9 (H) 04/18/2019   Lab Results  Component Value Date   CHOL 165 09/03/2014   HDL 32.70 (L) 09/03/2014   LDLDIRECT 67.3 09/03/2014   TRIG (H) 09/03/2014    574.0 Triglyceride is over 400; calculations on Lipids are invalid.   CHOLHDL 5 09/03/2014    Assessment/Plan  1. Other closed fracture of shaft of right femur, sequela -S/P ORIF, continue PT and OT for therapeutic strengthening exercises, fall precautions  2. Essential hypertension, benign -Well-controlled, continue losartan 100 mg 1 tab daily  3. Chronic diastolic congestive heart failure (HCC) -  No SOB, continue Lasix 20 mg 1 tab daily and Spironolactone 25 mg 1 tab daily  [facility]. Type 2 diabetes mellitus with other specified complication, with long-term current use of insulin (HCC) Lab Results  Component Value Date   HGBA1C 6.9 (H) 04/18/2019  -Well-controlled, continue Humalog insulin pump, Invokana 300 mg 1 tab daily, metformin 500 mg 1 tab at bedtime and CBG before meals and at bedtime, follows up with endocrinology  5. Hyperlipidemia, unspecified hyperlipidemia type Lab Results  Component Value Date   CHOL 165 09/03/2014   HDL 32.70 (L) 09/03/2014   LDLDIRECT 67.3 09/03/2014   TRIG (H) 09/03/2014    574.0 Triglyceride  is over 400; calculations on Lipids are invalid.   CHOLHDL 5 09/03/2014  -Continue simvastatin 40 mg 1 tab every evening  6.  Diarrhea, unspecified - thought to be not infectious, will hold MiraLAX x2 days then restart    Family/ staff Communication: Discussed plan of care  with resident.  Labs/tests ordered: None  Goals of care:   Short-term rehabilitation.   Durenda Age, DNP, FNP-BC [facility] [phone] (Monday-Friday 8:00 a.m. - 5:00 p.m.) [phone] (after hours)

## 2019-07-26 ENCOUNTER — Encounter: Payer: Self-pay | Admitting: Adult Health

## 2019-07-26 ENCOUNTER — Non-Acute Institutional Stay (SKILLED_NURSING_FACILITY): Payer: Medicare Other | Admitting: Adult Health

## 2019-07-26 DIAGNOSIS — E1169 Type 2 diabetes mellitus with other specified complication: Secondary | ICD-10-CM | POA: Diagnosis not present

## 2019-07-26 DIAGNOSIS — I5032 Chronic diastolic (congestive) heart failure: Secondary | ICD-10-CM

## 2019-07-26 DIAGNOSIS — E785 Hyperlipidemia, unspecified: Secondary | ICD-10-CM | POA: Diagnosis not present

## 2019-07-26 DIAGNOSIS — Z794 Long term (current) use of insulin: Secondary | ICD-10-CM | POA: Diagnosis not present

## 2019-07-26 DIAGNOSIS — I10 Essential (primary) hypertension: Secondary | ICD-10-CM | POA: Diagnosis not present

## 2019-07-26 DIAGNOSIS — S72451S Displaced supracondylar fracture without intracondylar extension of lower end of right femur, sequela: Secondary | ICD-10-CM | POA: Diagnosis not present

## 2019-07-26 MED ORDER — SPIRONOLACTONE 25 MG PO TABS: 25.0000 mg | ORAL_TABLET | Freq: Every day | ORAL | 3 refills | Status: DC

## 2019-07-26 MED ORDER — METFORMIN HCL ER 500 MG PO TB24: 500.0000 mg | ORAL_TABLET | Freq: Every day | ORAL | 0 refills | Status: DC

## 2019-07-26 MED ORDER — LOSARTAN POTASSIUM 100 MG PO TABS: 100.0000 mg | ORAL_TABLET | Freq: Every day | ORAL | 0 refills | Status: DC

## 2019-07-26 MED ORDER — ACETAMINOPHEN 325 MG PO TABS: 650.0000 mg | ORAL_TABLET | Freq: Four times a day (QID) | ORAL | Status: DC | PRN

## 2019-07-26 MED ORDER — INVOKANA 300 MG PO TABS: 300.0000 mg | ORAL_TABLET | Freq: Every day | ORAL | 0 refills | Status: DC

## 2019-07-26 MED ORDER — INSULIN LISPRO 100 UNIT/ML ~~LOC~~ SOLN: SUBCUTANEOUS | 0 refills | Status: DC

## 2019-07-26 MED ORDER — SIMVASTATIN 40 MG PO TABS: 40.0000 mg | ORAL_TABLET | Freq: Every evening | ORAL | 0 refills | Status: AC

## 2019-07-26 MED ORDER — FUROSEMIDE 20 MG PO TABS: 20.0000 mg | ORAL_TABLET | Freq: Every day | ORAL | 3 refills | Status: DC

## 2019-07-26 MED ORDER — METOPROLOL TARTRATE 100 MG PO TABS: 100.0000 mg | ORAL_TABLET | Freq: Two times a day (BID) | ORAL | 0 refills | Status: DC

## 2019-07-26 NOTE — Progress Notes (Signed)
Location:  Financial controller of Service:  SNF (31) Provider:  Durenda Age, DNP, FNP-BC  Patient Care Team: Cari Caraway, MD as PCP - General (Family Medicine)  Extended Emergency Contact Information Primary Emergency Contact: [place] Phone: [phone] Relation: Daughter  Code Status:  Full Code  Goals of care: Advanced Directive information Advanced Directives 07/26/2019  Does Patient Have a Medical Advance Directive? Yes  Type of Advance Directive (No Data)  Does patient want to make changes to medical advance directive? -  Would patient like information on creating a medical advance directive? -     Chief Complaint  Patient presents with  . Discharge Note    Discharge Visit    HPI:  Pt is a 73 y.o. female who is for discharge home on 07/30/19 with Home health PT and OT.  She has been admitted to [facility] on 04/30/19 from a recent hospitalization at [facility],  04/17/19 to 04/30/19.  She was hospitalized due to an unwitnessed mechanical fall in her home bathroom sustaining an acute displaced right distal femur fracture S/P ORIF on 6/25 with incisional VAC placement.  She, also, sustained a left ankle fracture which was placed on splint/supportive care.  .Patient was admitted to this facility for short-term rehabilitation after the patient's recent hospitalization.  Patient has completed SNF rehabilitation and therapy has cleared the patient for discharge.   Past Medical History:  Diagnosis Date  . Anemia   . Anxiety   . Arthritis   . Cataract   . Depression   . Diabetes mellitus without complication ([place])   . Diastolic dysfunction   . Frozen shoulder   . Glaucoma   . Headache    History of migraines  . Heart murmur   . History of kidney stones   . Hyperlipidemia   . Hypertension   . Macular degeneration   . Neuropathy   . Obesity   . Sleep  apnea   . Stroke [facility])    has been told by a Dr. Mort Sawyers that she may have had a mini stroke but no further testing was done to confirm  . Tachycardia    Past Surgical History:  Procedure Laterality Date  . CARDIAC CATHETERIZATION     -6/09-normal ejection fraction 65%, no angiographically significant CAD., M. Skains MD  . CESAREAN SECTION    . COLONOSCOPY    . foot spurs removed    . HYSTEROSCOPY W/D&C N/A 06/28/2017   Procedure: DILATATION AND CURETTAGE /HYSTEROSCOPY/POLYPECTOMY WITH MYOSURE;  Surgeon: Christophe Louis, MD;  Location: [facility];  Service: Gynecology;  Laterality: N/A;  . JOINT REPLACEMENT     right knee replaced  . knee spurs removed    . ORIF FEMUR FRACTURE Right 04/19/2019   Procedure: OPEN REDUCTION INTERNAL FIXATION (ORIF) DISTAL FEMUR FRACTURE;  Surgeon: Rod Can, MD;  Location: [facility];  Service: Orthopedics;  Laterality: Right;  . shoulder spurs removed    . TONSILLECTOMY AND ADENOIDECTOMY    . UPPER GI ENDOSCOPY      Allergies  Allergen Reactions  . Meloxicam     Having bleeding  . Percocet [Oxycodone-Acetaminophen] Other (See Comments)    Headache  . Tramadol   . Vicodin [Hydrocodone-Acetaminophen] Other (See Comments)    Headache   . Azithromycin Rash  . Iodine Rash    Outpatient Encounter Medications as of 07/26/2019  Medication  Sig  . acetaminophen (TYLENOL) 325 MG tablet Take 2 tablets (650 mg total) by mouth every 6 (six) hours as needed for mild pain. Do not take more than 4000mg  of tylenol per day  . B-D INS SYRINGE 0.5CC/31GX5/16 31G X 5/16" 0.5 ML MISC Use as directed per provider.  . Cholecalciferol (VITAMIN D3) 50 MCG (2000 UT) capsule Take 2,000 Units by mouth daily.  . Coenzyme Q10 (CO Q-10) 100 MG CAPS Take 100 mg by mouth at bedtime.   Marland Kitchen DIMETHICONE, TOPICAL, 2 % CREA MEDERMA CREAM Apply topically to right hip to knee scar Qhs  . dorzolamide-timolol (COSOPT) 22.3-6.8 MG/ML ophthalmic solution Place 1 drop into both eyes 2 (two) times  daily.   . furosemide (LASIX) 20 MG tablet Take 1 tablet (20 mg total) by mouth daily.  . insulin lispro (HUMALOG) 100 UNIT/ML injection Inject into the skin daily. Insulin pump  . INVOKANA 300 MG TABS tablet Take 300 mg by mouth daily.  . Iron-Vitamin C (VITRON-C) 65-125 MG TABS Take 1 tablet by mouth at bedtime.   Marland Kitchen losartan (COZAAR) 100 MG tablet TAKE 1 TABLET DAILY  . metFORMIN (GLUCOPHAGE-XR) 500 MG 24 hr tablet Take 500 mg by mouth at bedtime.   . metoprolol tartrate (LOPRESSOR) 100 MG tablet TAKE 1 TABLET TWICE A DAY  . Multiple Vitamins-Minerals (PRESERVISION AREDS 2 PO) Take 1 tablet by mouth 2 (two) times daily.  . NON FORMULARY REGULAR, HEART HEALTHY - DIABETIC DIET  . ONE TOUCH ULTRA TEST test strip 1 each by Other route as needed (Use as directed per provider).   . polyethylene glycol (MIRALAX / GLYCOLAX) 17 g packet Take 17 g by mouth daily.  . simvastatin (ZOCOR) 40 MG tablet Take 40 mg by mouth every evening.  Marland Kitchen spironolactone (ALDACTONE) 25 MG tablet Take 1 tablet (25 mg total) by mouth daily.  . Travoprost, BAK Free, (TRAVATAN) 0.004 % SOLN ophthalmic solution Place 1 drop into both eyes at bedtime.    No facility-administered encounter medications on file as of 07/26/2019.     Review of Systems  GENERAL: No change in appetite, no fatigue, no weight changes, no fever, chills or weakness MOUTH and THROAT: Denies oral discomfort, gingival pain or bleeding RESPIRATORY: no cough, SOB, DOE, wheezing, hemoptysis CARDIAC: No chest pain, or palpitations GI: No abdominal pain, diarrhea, constipation, heart burn, nausea or vomiting GU: Denies dysuria, frequency, hematuria, incontinence, or discharge NEUROLOGICAL: Denies dizziness, syncope, numbness, or headache PSYCHIATRIC: Denies feelings of depression or anxiety. No report of hallucinations, insomnia, paranoia, or agitation    Immunization History  Administered Date(s) Administered  . Influenza-Unspecified 06/25/2018    Pertinent  Health Maintenance Due  Topic Date Due  . MAMMOGRAM  05/28/1998  . COLONOSCOPY  05/28/1998  . DEXA SCAN  05/28/2013  . PNA vac Low Risk Adult (1 of 2 - PCV13) 05/28/2013  . INFLUENZA VACCINE  05/26/2019  . FOOT EXAM  10/26/2019 (Originally 05/28/1956)  . OPHTHALMOLOGY EXAM  10/26/2019 (Originally 05/28/1956)  . HEMOGLOBIN A1C  10/18/2019   Fall Risk  12/16/2016 09/06/2016 06/03/2016 04/01/2016 02/05/2016  Falls in the past year? No No No No No  Number falls in past yr: - - - - -  Injury with Fall? - - - - -  Risk Factor Category  - - - - -  Risk for fall due to : - - - - -  Follow up - - - - -  Comment - - - - -  Vitals:   07/26/19 1209  BP: 132/68  Pulse: 88  Resp: 18  Temp: 97.8 F (36.6 C)  TempSrc: Oral  SpO2: 97%  Weight: 225 lb 6.4 oz (102.2 kg)  Height: 4\' 11"  (1.499 m)   Body mass index is 45.53 kg/m.  Physical Exam  GENERAL APPEARANCE: Well nourished. In no acute distress. Morbidly obese SKIN:  Skin is warm and dry.  MOUTH and THROAT: Lips are without lesions. Oral mucosa is moist and without lesions. Tongue is normal in shape, size, and color and without lesions RESPIRATORY: Breathing is even & unlabored, BS CTAB CARDIAC: RRR, no murmur,no extra heart sounds, no edema GI: Abdomen soft, normal BS, no masses, no tenderness Able to move X 4 extremities NEUROLOGICAL: There is no tremor. Speech is clear.Alert and oriented X 3.  PSYCHIATRIC:  Affect and behavior are appropriate   Labs reviewed: Recent Labs    04/20/19 0414 04/21/19 0439 04/26/19 0455 04/29/19 0438  NA 138 139  --  136  K 4.3 4.0  --  4.0  CL 104 104  --  101  CO2 23 25  --  28  GLUCOSE 319* 138*  --  127*  BUN 32* 36*  --  31*  CREATININE 0.74 0.65 0.64 0.59  CALCIUM 8.7* 8.5*  --  9.0    Recent Labs    04/17/19 1437  04/21/19 0439 04/22/19 0446 04/29/19 0438  WBC 15.9*   < > 11.3* 9.6 9.2  NEUTROABS 13.1*  --   --   --   --   HGB 13.4   < > 9.3* 9.0* 9.6*  HCT  41.9   < > 28.8* 29.0* 31.6*  MCV 85.9   < > 88.1 88.7 87.5  PLT 202   < > 173 207 299   < > = values in this interval not displayed.   No results found for: TSH Lab Results  Component Value Date   HGBA1C 6.9 (H) 04/18/2019   Lab Results  Component Value Date   CHOL 165 09/03/2014   HDL 32.70 (L) 09/03/2014   LDLDIRECT 67.3 09/03/2014   TRIG (H) 09/03/2014    574.0 Triglyceride is over 400; calculations on Lipids are invalid.   CHOLHDL 5 09/03/2014      Assessment/Plan  1. Closed displaced supracondylar fracture of distal end of right femur without intracondylar extension, sequela -S/P ORIF on 6/25, follow-up with orthopedics, will have Home health PT and OT for therapeutic strengthening exercises, fall precautions  2. Type 2 diabetes mellitus with other specified complication, with long-term current use of insulin (HCC) - insulin lispro (HUMALOG) 100 UNIT/ML injection; Insulin pump   - INVOKANA 300 MG TABS tablet; Take 1 tablet (300 mg total) by mouth daily.   - metFORMIN (GLUCOPHAGE-XR) 500 MG 24 hr tablet; Take 1 tablet (500 mg total) by mouth at bedtime.    3. Chronic diastolic congestive heart failure (HCC) - furosemide (LASIX) 20 MG tablet; Take 1 tablet (20 mg total) by mouth daily.   - metoprolol tartrate (LOPRESSOR) 100 MG tablet; Take 1 tablet (100 mg total) by mouth 2 (two) times daily.   - spironolactone (ALDACTONE) 25 MG tablet; Take 1 tablet (25 mg total) by mouth daily.    4. Essential hypertension, benign - losartan (COZAAR) 100 MG tablet; Take 1 tablet (100 mg total) by mouth daily.  - metoprolol tartrate (LOPRESSOR) 100 MG tablet; Take 1 tablet (100 mg total) by mouth 2 (two) times daily.    5.  Hyperlipidemia, unspecified hyperlipidemia type - simvastatin (ZOCOR) 40 MG tablet; Take 1 tablet (40 mg total) by mouth every evening.      I have filled out patient's discharge paperwork and written prescriptions.  Patient will receive home health PT and OT.   DME provided: wheelchair - she has a mobility limitation that prevents her from completing ADLs.  She had a fall at home for which she sustained acute displaced right distal femur fracture S/P ORIF with incisional VAC placement on 04/19/2019.  She is unable to safely ambulate with an assistive device such as a rolling walker or cane.  She cannot safely self propel a wheelchair.  Total discharge time: Greater than 30 minutes Greater than 50% was spent in counseling and coordination of care.   Discharge time involved coordination of the discharge process with social worker, nursing staff and therapy department. Medical justification for home health services/DME verified.     Durenda Age, DNP, FNP-BC [facility] [phone] (Monday-Friday 8:00 a.m. - 5:00 p.m.) [phone] (after hours)

## 2019-07-31 DIAGNOSIS — I5032 Chronic diastolic (congestive) heart failure: Secondary | ICD-10-CM | POA: Diagnosis not present

## 2019-07-31 DIAGNOSIS — Z8673 Personal history of transient ischemic attack (TIA), and cerebral infarction without residual deficits: Secondary | ICD-10-CM | POA: Diagnosis not present

## 2019-07-31 DIAGNOSIS — Z9861 Coronary angioplasty status: Secondary | ICD-10-CM | POA: Diagnosis not present

## 2019-07-31 DIAGNOSIS — S8262XD Displaced fracture of lateral malleolus of left fibula, subsequent encounter for closed fracture with routine healing: Secondary | ICD-10-CM | POA: Diagnosis not present

## 2019-07-31 DIAGNOSIS — Z6841 Body Mass Index (BMI) 40.0 and over, adult: Secondary | ICD-10-CM | POA: Diagnosis not present

## 2019-07-31 DIAGNOSIS — H3530 Unspecified macular degeneration: Secondary | ICD-10-CM | POA: Diagnosis not present

## 2019-07-31 DIAGNOSIS — S72391D Other fracture of shaft of right femur, subsequent encounter for closed fracture with routine healing: Secondary | ICD-10-CM | POA: Diagnosis not present

## 2019-07-31 DIAGNOSIS — N200 Calculus of kidney: Secondary | ICD-10-CM | POA: Diagnosis not present

## 2019-07-31 DIAGNOSIS — M1712 Unilateral primary osteoarthritis, left knee: Secondary | ICD-10-CM | POA: Diagnosis not present

## 2019-07-31 DIAGNOSIS — I110 Hypertensive heart disease with heart failure: Secondary | ICD-10-CM | POA: Diagnosis not present

## 2019-07-31 DIAGNOSIS — S72001D Fracture of unspecified part of neck of right femur, subsequent encounter for closed fracture with routine healing: Secondary | ICD-10-CM | POA: Diagnosis not present

## 2019-07-31 DIAGNOSIS — D649 Anemia, unspecified: Secondary | ICD-10-CM | POA: Diagnosis not present

## 2019-07-31 DIAGNOSIS — E1140 Type 2 diabetes mellitus with diabetic neuropathy, unspecified: Secondary | ICD-10-CM | POA: Diagnosis not present

## 2019-07-31 DIAGNOSIS — M160 Bilateral primary osteoarthritis of hip: Secondary | ICD-10-CM | POA: Diagnosis not present

## 2019-07-31 DIAGNOSIS — F419 Anxiety disorder, unspecified: Secondary | ICD-10-CM | POA: Diagnosis not present

## 2019-07-31 DIAGNOSIS — G43909 Migraine, unspecified, not intractable, without status migrainosus: Secondary | ICD-10-CM | POA: Diagnosis not present

## 2019-07-31 DIAGNOSIS — E785 Hyperlipidemia, unspecified: Secondary | ICD-10-CM | POA: Diagnosis not present

## 2019-07-31 DIAGNOSIS — H409 Unspecified glaucoma: Secondary | ICD-10-CM | POA: Diagnosis not present

## 2019-07-31 DIAGNOSIS — F329 Major depressive disorder, single episode, unspecified: Secondary | ICD-10-CM | POA: Diagnosis not present

## 2019-07-31 DIAGNOSIS — Z794 Long term (current) use of insulin: Secondary | ICD-10-CM | POA: Diagnosis not present

## 2019-07-31 DIAGNOSIS — M7500 Adhesive capsulitis of unspecified shoulder: Secondary | ICD-10-CM | POA: Diagnosis not present

## 2019-07-31 DIAGNOSIS — E1136 Type 2 diabetes mellitus with diabetic cataract: Secondary | ICD-10-CM | POA: Diagnosis not present

## 2019-07-31 DIAGNOSIS — G4730 Sleep apnea, unspecified: Secondary | ICD-10-CM | POA: Diagnosis not present

## 2019-07-31 DIAGNOSIS — R000 Tachycardia, unspecified: Secondary | ICD-10-CM | POA: Diagnosis not present

## 2019-08-01 DIAGNOSIS — S8262XD Displaced fracture of lateral malleolus of left fibula, subsequent encounter for closed fracture with routine healing: Secondary | ICD-10-CM | POA: Diagnosis not present

## 2019-08-01 DIAGNOSIS — E1140 Type 2 diabetes mellitus with diabetic neuropathy, unspecified: Secondary | ICD-10-CM | POA: Diagnosis not present

## 2019-08-01 DIAGNOSIS — S72001D Fracture of unspecified part of neck of right femur, subsequent encounter for closed fracture with routine healing: Secondary | ICD-10-CM | POA: Diagnosis not present

## 2019-08-01 DIAGNOSIS — S72391D Other fracture of shaft of right femur, subsequent encounter for closed fracture with routine healing: Secondary | ICD-10-CM | POA: Diagnosis not present

## 2019-08-01 DIAGNOSIS — I5032 Chronic diastolic (congestive) heart failure: Secondary | ICD-10-CM | POA: Diagnosis not present

## 2019-08-01 DIAGNOSIS — I110 Hypertensive heart disease with heart failure: Secondary | ICD-10-CM | POA: Diagnosis not present

## 2019-08-02 DIAGNOSIS — E1140 Type 2 diabetes mellitus with diabetic neuropathy, unspecified: Secondary | ICD-10-CM | POA: Diagnosis not present

## 2019-08-02 DIAGNOSIS — I5032 Chronic diastolic (congestive) heart failure: Secondary | ICD-10-CM | POA: Diagnosis not present

## 2019-08-02 DIAGNOSIS — S8262XD Displaced fracture of lateral malleolus of left fibula, subsequent encounter for closed fracture with routine healing: Secondary | ICD-10-CM | POA: Diagnosis not present

## 2019-08-02 DIAGNOSIS — I110 Hypertensive heart disease with heart failure: Secondary | ICD-10-CM | POA: Diagnosis not present

## 2019-08-02 DIAGNOSIS — S72391D Other fracture of shaft of right femur, subsequent encounter for closed fracture with routine healing: Secondary | ICD-10-CM | POA: Diagnosis not present

## 2019-08-02 DIAGNOSIS — S72001D Fracture of unspecified part of neck of right femur, subsequent encounter for closed fracture with routine healing: Secondary | ICD-10-CM | POA: Diagnosis not present

## 2019-08-03 DIAGNOSIS — S72391D Other fracture of shaft of right femur, subsequent encounter for closed fracture with routine healing: Secondary | ICD-10-CM | POA: Diagnosis not present

## 2019-08-03 DIAGNOSIS — S72001D Fracture of unspecified part of neck of right femur, subsequent encounter for closed fracture with routine healing: Secondary | ICD-10-CM | POA: Diagnosis not present

## 2019-08-03 DIAGNOSIS — E1140 Type 2 diabetes mellitus with diabetic neuropathy, unspecified: Secondary | ICD-10-CM | POA: Diagnosis not present

## 2019-08-03 DIAGNOSIS — S8262XD Displaced fracture of lateral malleolus of left fibula, subsequent encounter for closed fracture with routine healing: Secondary | ICD-10-CM | POA: Diagnosis not present

## 2019-08-03 DIAGNOSIS — I110 Hypertensive heart disease with heart failure: Secondary | ICD-10-CM | POA: Diagnosis not present

## 2019-08-03 DIAGNOSIS — I5032 Chronic diastolic (congestive) heart failure: Secondary | ICD-10-CM | POA: Diagnosis not present

## 2019-08-07 ENCOUNTER — Other Ambulatory Visit: Payer: Self-pay | Admitting: Cardiology

## 2019-08-07 DIAGNOSIS — I5032 Chronic diastolic (congestive) heart failure: Secondary | ICD-10-CM

## 2019-08-07 DIAGNOSIS — I10 Essential (primary) hypertension: Secondary | ICD-10-CM

## 2019-08-07 DIAGNOSIS — I110 Hypertensive heart disease with heart failure: Secondary | ICD-10-CM | POA: Diagnosis not present

## 2019-08-07 DIAGNOSIS — S72001D Fracture of unspecified part of neck of right femur, subsequent encounter for closed fracture with routine healing: Secondary | ICD-10-CM | POA: Diagnosis not present

## 2019-08-07 DIAGNOSIS — S8262XD Displaced fracture of lateral malleolus of left fibula, subsequent encounter for closed fracture with routine healing: Secondary | ICD-10-CM | POA: Diagnosis not present

## 2019-08-07 DIAGNOSIS — E1140 Type 2 diabetes mellitus with diabetic neuropathy, unspecified: Secondary | ICD-10-CM | POA: Diagnosis not present

## 2019-08-07 DIAGNOSIS — S72391D Other fracture of shaft of right femur, subsequent encounter for closed fracture with routine healing: Secondary | ICD-10-CM | POA: Diagnosis not present

## 2019-08-08 DIAGNOSIS — I110 Hypertensive heart disease with heart failure: Secondary | ICD-10-CM | POA: Diagnosis not present

## 2019-08-08 DIAGNOSIS — S8262XD Displaced fracture of lateral malleolus of left fibula, subsequent encounter for closed fracture with routine healing: Secondary | ICD-10-CM | POA: Diagnosis not present

## 2019-08-08 DIAGNOSIS — E1140 Type 2 diabetes mellitus with diabetic neuropathy, unspecified: Secondary | ICD-10-CM | POA: Diagnosis not present

## 2019-08-08 DIAGNOSIS — S72391D Other fracture of shaft of right femur, subsequent encounter for closed fracture with routine healing: Secondary | ICD-10-CM | POA: Diagnosis not present

## 2019-08-08 DIAGNOSIS — I5032 Chronic diastolic (congestive) heart failure: Secondary | ICD-10-CM | POA: Diagnosis not present

## 2019-08-08 DIAGNOSIS — S72001D Fracture of unspecified part of neck of right femur, subsequent encounter for closed fracture with routine healing: Secondary | ICD-10-CM | POA: Diagnosis not present

## 2019-08-09 DIAGNOSIS — I110 Hypertensive heart disease with heart failure: Secondary | ICD-10-CM | POA: Diagnosis not present

## 2019-08-09 DIAGNOSIS — I5032 Chronic diastolic (congestive) heart failure: Secondary | ICD-10-CM | POA: Diagnosis not present

## 2019-08-09 DIAGNOSIS — E1140 Type 2 diabetes mellitus with diabetic neuropathy, unspecified: Secondary | ICD-10-CM | POA: Diagnosis not present

## 2019-08-09 DIAGNOSIS — S8262XD Displaced fracture of lateral malleolus of left fibula, subsequent encounter for closed fracture with routine healing: Secondary | ICD-10-CM | POA: Diagnosis not present

## 2019-08-09 DIAGNOSIS — S72391D Other fracture of shaft of right femur, subsequent encounter for closed fracture with routine healing: Secondary | ICD-10-CM | POA: Diagnosis not present

## 2019-08-09 DIAGNOSIS — S72001D Fracture of unspecified part of neck of right femur, subsequent encounter for closed fracture with routine healing: Secondary | ICD-10-CM | POA: Diagnosis not present

## 2019-08-10 DIAGNOSIS — I110 Hypertensive heart disease with heart failure: Secondary | ICD-10-CM | POA: Diagnosis not present

## 2019-08-10 DIAGNOSIS — S8262XD Displaced fracture of lateral malleolus of left fibula, subsequent encounter for closed fracture with routine healing: Secondary | ICD-10-CM | POA: Diagnosis not present

## 2019-08-10 DIAGNOSIS — E1140 Type 2 diabetes mellitus with diabetic neuropathy, unspecified: Secondary | ICD-10-CM | POA: Diagnosis not present

## 2019-08-10 DIAGNOSIS — S72391D Other fracture of shaft of right femur, subsequent encounter for closed fracture with routine healing: Secondary | ICD-10-CM | POA: Diagnosis not present

## 2019-08-10 DIAGNOSIS — S72001D Fracture of unspecified part of neck of right femur, subsequent encounter for closed fracture with routine healing: Secondary | ICD-10-CM | POA: Diagnosis not present

## 2019-08-10 DIAGNOSIS — I5032 Chronic diastolic (congestive) heart failure: Secondary | ICD-10-CM | POA: Diagnosis not present

## 2019-08-13 DIAGNOSIS — I110 Hypertensive heart disease with heart failure: Secondary | ICD-10-CM | POA: Diagnosis not present

## 2019-08-13 DIAGNOSIS — S8262XD Displaced fracture of lateral malleolus of left fibula, subsequent encounter for closed fracture with routine healing: Secondary | ICD-10-CM | POA: Diagnosis not present

## 2019-08-13 DIAGNOSIS — E1140 Type 2 diabetes mellitus with diabetic neuropathy, unspecified: Secondary | ICD-10-CM | POA: Diagnosis not present

## 2019-08-13 DIAGNOSIS — S72391D Other fracture of shaft of right femur, subsequent encounter for closed fracture with routine healing: Secondary | ICD-10-CM | POA: Diagnosis not present

## 2019-08-13 DIAGNOSIS — I5032 Chronic diastolic (congestive) heart failure: Secondary | ICD-10-CM | POA: Diagnosis not present

## 2019-08-13 DIAGNOSIS — S72001D Fracture of unspecified part of neck of right femur, subsequent encounter for closed fracture with routine healing: Secondary | ICD-10-CM | POA: Diagnosis not present

## 2019-08-14 DIAGNOSIS — I110 Hypertensive heart disease with heart failure: Secondary | ICD-10-CM | POA: Diagnosis not present

## 2019-08-14 DIAGNOSIS — I5032 Chronic diastolic (congestive) heart failure: Secondary | ICD-10-CM | POA: Diagnosis not present

## 2019-08-14 DIAGNOSIS — S72001D Fracture of unspecified part of neck of right femur, subsequent encounter for closed fracture with routine healing: Secondary | ICD-10-CM | POA: Diagnosis not present

## 2019-08-14 DIAGNOSIS — S8262XD Displaced fracture of lateral malleolus of left fibula, subsequent encounter for closed fracture with routine healing: Secondary | ICD-10-CM | POA: Diagnosis not present

## 2019-08-14 DIAGNOSIS — E1140 Type 2 diabetes mellitus with diabetic neuropathy, unspecified: Secondary | ICD-10-CM | POA: Diagnosis not present

## 2019-08-14 DIAGNOSIS — S72391D Other fracture of shaft of right femur, subsequent encounter for closed fracture with routine healing: Secondary | ICD-10-CM | POA: Diagnosis not present

## 2019-08-15 DIAGNOSIS — S72391D Other fracture of shaft of right femur, subsequent encounter for closed fracture with routine healing: Secondary | ICD-10-CM | POA: Diagnosis not present

## 2019-08-15 DIAGNOSIS — I5032 Chronic diastolic (congestive) heart failure: Secondary | ICD-10-CM | POA: Diagnosis not present

## 2019-08-15 DIAGNOSIS — I110 Hypertensive heart disease with heart failure: Secondary | ICD-10-CM | POA: Diagnosis not present

## 2019-08-15 DIAGNOSIS — S8262XD Displaced fracture of lateral malleolus of left fibula, subsequent encounter for closed fracture with routine healing: Secondary | ICD-10-CM | POA: Diagnosis not present

## 2019-08-15 DIAGNOSIS — S72001D Fracture of unspecified part of neck of right femur, subsequent encounter for closed fracture with routine healing: Secondary | ICD-10-CM | POA: Diagnosis not present

## 2019-08-15 DIAGNOSIS — E1140 Type 2 diabetes mellitus with diabetic neuropathy, unspecified: Secondary | ICD-10-CM | POA: Diagnosis not present

## 2019-08-16 DIAGNOSIS — S72391D Other fracture of shaft of right femur, subsequent encounter for closed fracture with routine healing: Secondary | ICD-10-CM | POA: Diagnosis not present

## 2019-08-16 DIAGNOSIS — I110 Hypertensive heart disease with heart failure: Secondary | ICD-10-CM | POA: Diagnosis not present

## 2019-08-16 DIAGNOSIS — S72001D Fracture of unspecified part of neck of right femur, subsequent encounter for closed fracture with routine healing: Secondary | ICD-10-CM | POA: Diagnosis not present

## 2019-08-16 DIAGNOSIS — S8262XD Displaced fracture of lateral malleolus of left fibula, subsequent encounter for closed fracture with routine healing: Secondary | ICD-10-CM | POA: Diagnosis not present

## 2019-08-16 DIAGNOSIS — I5032 Chronic diastolic (congestive) heart failure: Secondary | ICD-10-CM | POA: Diagnosis not present

## 2019-08-16 DIAGNOSIS — E1140 Type 2 diabetes mellitus with diabetic neuropathy, unspecified: Secondary | ICD-10-CM | POA: Diagnosis not present

## 2019-08-20 DIAGNOSIS — I5032 Chronic diastolic (congestive) heart failure: Secondary | ICD-10-CM | POA: Diagnosis not present

## 2019-08-20 DIAGNOSIS — S8262XD Displaced fracture of lateral malleolus of left fibula, subsequent encounter for closed fracture with routine healing: Secondary | ICD-10-CM | POA: Diagnosis not present

## 2019-08-20 DIAGNOSIS — S72001D Fracture of unspecified part of neck of right femur, subsequent encounter for closed fracture with routine healing: Secondary | ICD-10-CM | POA: Diagnosis not present

## 2019-08-20 DIAGNOSIS — I110 Hypertensive heart disease with heart failure: Secondary | ICD-10-CM | POA: Diagnosis not present

## 2019-08-20 DIAGNOSIS — S72391D Other fracture of shaft of right femur, subsequent encounter for closed fracture with routine healing: Secondary | ICD-10-CM | POA: Diagnosis not present

## 2019-08-20 DIAGNOSIS — E1140 Type 2 diabetes mellitus with diabetic neuropathy, unspecified: Secondary | ICD-10-CM | POA: Diagnosis not present

## 2019-08-21 DIAGNOSIS — I5032 Chronic diastolic (congestive) heart failure: Secondary | ICD-10-CM | POA: Diagnosis not present

## 2019-08-21 DIAGNOSIS — S72391D Other fracture of shaft of right femur, subsequent encounter for closed fracture with routine healing: Secondary | ICD-10-CM | POA: Diagnosis not present

## 2019-08-21 DIAGNOSIS — S8262XD Displaced fracture of lateral malleolus of left fibula, subsequent encounter for closed fracture with routine healing: Secondary | ICD-10-CM | POA: Diagnosis not present

## 2019-08-21 DIAGNOSIS — S72001D Fracture of unspecified part of neck of right femur, subsequent encounter for closed fracture with routine healing: Secondary | ICD-10-CM | POA: Diagnosis not present

## 2019-08-21 DIAGNOSIS — E1140 Type 2 diabetes mellitus with diabetic neuropathy, unspecified: Secondary | ICD-10-CM | POA: Diagnosis not present

## 2019-08-21 DIAGNOSIS — I110 Hypertensive heart disease with heart failure: Secondary | ICD-10-CM | POA: Diagnosis not present

## 2019-08-22 DIAGNOSIS — E1140 Type 2 diabetes mellitus with diabetic neuropathy, unspecified: Secondary | ICD-10-CM | POA: Diagnosis not present

## 2019-08-22 DIAGNOSIS — S72391D Other fracture of shaft of right femur, subsequent encounter for closed fracture with routine healing: Secondary | ICD-10-CM | POA: Diagnosis not present

## 2019-08-22 DIAGNOSIS — I110 Hypertensive heart disease with heart failure: Secondary | ICD-10-CM | POA: Diagnosis not present

## 2019-08-22 DIAGNOSIS — S72001D Fracture of unspecified part of neck of right femur, subsequent encounter for closed fracture with routine healing: Secondary | ICD-10-CM | POA: Diagnosis not present

## 2019-08-22 DIAGNOSIS — I5032 Chronic diastolic (congestive) heart failure: Secondary | ICD-10-CM | POA: Diagnosis not present

## 2019-08-22 DIAGNOSIS — S8262XD Displaced fracture of lateral malleolus of left fibula, subsequent encounter for closed fracture with routine healing: Secondary | ICD-10-CM | POA: Diagnosis not present

## 2019-08-24 DIAGNOSIS — S8262XD Displaced fracture of lateral malleolus of left fibula, subsequent encounter for closed fracture with routine healing: Secondary | ICD-10-CM | POA: Diagnosis not present

## 2019-08-24 DIAGNOSIS — S72001D Fracture of unspecified part of neck of right femur, subsequent encounter for closed fracture with routine healing: Secondary | ICD-10-CM | POA: Diagnosis not present

## 2019-08-24 DIAGNOSIS — I110 Hypertensive heart disease with heart failure: Secondary | ICD-10-CM | POA: Diagnosis not present

## 2019-08-24 DIAGNOSIS — I5032 Chronic diastolic (congestive) heart failure: Secondary | ICD-10-CM | POA: Diagnosis not present

## 2019-08-24 DIAGNOSIS — S72391D Other fracture of shaft of right femur, subsequent encounter for closed fracture with routine healing: Secondary | ICD-10-CM | POA: Diagnosis not present

## 2019-08-24 DIAGNOSIS — E1140 Type 2 diabetes mellitus with diabetic neuropathy, unspecified: Secondary | ICD-10-CM | POA: Diagnosis not present

## 2019-08-27 ENCOUNTER — Other Ambulatory Visit: Payer: Self-pay | Admitting: Cardiology

## 2019-08-27 DIAGNOSIS — S72391D Other fracture of shaft of right femur, subsequent encounter for closed fracture with routine healing: Secondary | ICD-10-CM | POA: Diagnosis not present

## 2019-08-27 DIAGNOSIS — I5032 Chronic diastolic (congestive) heart failure: Secondary | ICD-10-CM | POA: Diagnosis not present

## 2019-08-27 DIAGNOSIS — I110 Hypertensive heart disease with heart failure: Secondary | ICD-10-CM | POA: Diagnosis not present

## 2019-08-27 DIAGNOSIS — E1140 Type 2 diabetes mellitus with diabetic neuropathy, unspecified: Secondary | ICD-10-CM | POA: Diagnosis not present

## 2019-08-27 DIAGNOSIS — I10 Essential (primary) hypertension: Secondary | ICD-10-CM

## 2019-08-27 DIAGNOSIS — S8262XD Displaced fracture of lateral malleolus of left fibula, subsequent encounter for closed fracture with routine healing: Secondary | ICD-10-CM | POA: Diagnosis not present

## 2019-08-27 DIAGNOSIS — S72001D Fracture of unspecified part of neck of right femur, subsequent encounter for closed fracture with routine healing: Secondary | ICD-10-CM | POA: Diagnosis not present

## 2019-08-28 DIAGNOSIS — S72391D Other fracture of shaft of right femur, subsequent encounter for closed fracture with routine healing: Secondary | ICD-10-CM | POA: Diagnosis not present

## 2019-08-28 DIAGNOSIS — I5032 Chronic diastolic (congestive) heart failure: Secondary | ICD-10-CM | POA: Diagnosis not present

## 2019-08-28 DIAGNOSIS — S72001D Fracture of unspecified part of neck of right femur, subsequent encounter for closed fracture with routine healing: Secondary | ICD-10-CM | POA: Diagnosis not present

## 2019-08-28 DIAGNOSIS — S8262XD Displaced fracture of lateral malleolus of left fibula, subsequent encounter for closed fracture with routine healing: Secondary | ICD-10-CM | POA: Diagnosis not present

## 2019-08-28 DIAGNOSIS — E1140 Type 2 diabetes mellitus with diabetic neuropathy, unspecified: Secondary | ICD-10-CM | POA: Diagnosis not present

## 2019-08-28 DIAGNOSIS — I110 Hypertensive heart disease with heart failure: Secondary | ICD-10-CM | POA: Diagnosis not present

## 2019-08-29 DIAGNOSIS — S8262XD Displaced fracture of lateral malleolus of left fibula, subsequent encounter for closed fracture with routine healing: Secondary | ICD-10-CM | POA: Diagnosis not present

## 2019-08-29 DIAGNOSIS — I5032 Chronic diastolic (congestive) heart failure: Secondary | ICD-10-CM | POA: Diagnosis not present

## 2019-08-29 DIAGNOSIS — I110 Hypertensive heart disease with heart failure: Secondary | ICD-10-CM | POA: Diagnosis not present

## 2019-08-29 DIAGNOSIS — E1140 Type 2 diabetes mellitus with diabetic neuropathy, unspecified: Secondary | ICD-10-CM | POA: Diagnosis not present

## 2019-08-29 DIAGNOSIS — S72001D Fracture of unspecified part of neck of right femur, subsequent encounter for closed fracture with routine healing: Secondary | ICD-10-CM | POA: Diagnosis not present

## 2019-08-29 DIAGNOSIS — S72391D Other fracture of shaft of right femur, subsequent encounter for closed fracture with routine healing: Secondary | ICD-10-CM | POA: Diagnosis not present

## 2019-08-30 DIAGNOSIS — S8262XD Displaced fracture of lateral malleolus of left fibula, subsequent encounter for closed fracture with routine healing: Secondary | ICD-10-CM | POA: Diagnosis not present

## 2019-08-30 DIAGNOSIS — Z9861 Coronary angioplasty status: Secondary | ICD-10-CM | POA: Diagnosis not present

## 2019-08-30 DIAGNOSIS — N200 Calculus of kidney: Secondary | ICD-10-CM | POA: Diagnosis not present

## 2019-08-30 DIAGNOSIS — E1136 Type 2 diabetes mellitus with diabetic cataract: Secondary | ICD-10-CM | POA: Diagnosis not present

## 2019-08-30 DIAGNOSIS — F329 Major depressive disorder, single episode, unspecified: Secondary | ICD-10-CM | POA: Diagnosis not present

## 2019-08-30 DIAGNOSIS — I110 Hypertensive heart disease with heart failure: Secondary | ICD-10-CM | POA: Diagnosis not present

## 2019-08-30 DIAGNOSIS — Z6841 Body Mass Index (BMI) 40.0 and over, adult: Secondary | ICD-10-CM | POA: Diagnosis not present

## 2019-08-30 DIAGNOSIS — S72001D Fracture of unspecified part of neck of right femur, subsequent encounter for closed fracture with routine healing: Secondary | ICD-10-CM | POA: Diagnosis not present

## 2019-08-30 DIAGNOSIS — E1140 Type 2 diabetes mellitus with diabetic neuropathy, unspecified: Secondary | ICD-10-CM | POA: Diagnosis not present

## 2019-08-30 DIAGNOSIS — F419 Anxiety disorder, unspecified: Secondary | ICD-10-CM | POA: Diagnosis not present

## 2019-08-30 DIAGNOSIS — H409 Unspecified glaucoma: Secondary | ICD-10-CM | POA: Diagnosis not present

## 2019-08-30 DIAGNOSIS — G43909 Migraine, unspecified, not intractable, without status migrainosus: Secondary | ICD-10-CM | POA: Diagnosis not present

## 2019-08-30 DIAGNOSIS — R000 Tachycardia, unspecified: Secondary | ICD-10-CM | POA: Diagnosis not present

## 2019-08-30 DIAGNOSIS — E785 Hyperlipidemia, unspecified: Secondary | ICD-10-CM | POA: Diagnosis not present

## 2019-08-30 DIAGNOSIS — D649 Anemia, unspecified: Secondary | ICD-10-CM | POA: Diagnosis not present

## 2019-08-30 DIAGNOSIS — M160 Bilateral primary osteoarthritis of hip: Secondary | ICD-10-CM | POA: Diagnosis not present

## 2019-08-30 DIAGNOSIS — M7500 Adhesive capsulitis of unspecified shoulder: Secondary | ICD-10-CM | POA: Diagnosis not present

## 2019-08-30 DIAGNOSIS — I5032 Chronic diastolic (congestive) heart failure: Secondary | ICD-10-CM | POA: Diagnosis not present

## 2019-08-30 DIAGNOSIS — G4730 Sleep apnea, unspecified: Secondary | ICD-10-CM | POA: Diagnosis not present

## 2019-08-30 DIAGNOSIS — S72391D Other fracture of shaft of right femur, subsequent encounter for closed fracture with routine healing: Secondary | ICD-10-CM | POA: Diagnosis not present

## 2019-08-30 DIAGNOSIS — M1712 Unilateral primary osteoarthritis, left knee: Secondary | ICD-10-CM | POA: Diagnosis not present

## 2019-08-30 DIAGNOSIS — Z8673 Personal history of transient ischemic attack (TIA), and cerebral infarction without residual deficits: Secondary | ICD-10-CM | POA: Diagnosis not present

## 2019-08-30 DIAGNOSIS — Z794 Long term (current) use of insulin: Secondary | ICD-10-CM | POA: Diagnosis not present

## 2019-08-30 DIAGNOSIS — H3530 Unspecified macular degeneration: Secondary | ICD-10-CM | POA: Diagnosis not present

## 2019-08-31 ENCOUNTER — Other Ambulatory Visit: Payer: Self-pay | Admitting: Cardiology

## 2019-08-31 DIAGNOSIS — I5032 Chronic diastolic (congestive) heart failure: Secondary | ICD-10-CM

## 2019-08-31 DIAGNOSIS — I10 Essential (primary) hypertension: Secondary | ICD-10-CM

## 2019-09-04 DIAGNOSIS — I5032 Chronic diastolic (congestive) heart failure: Secondary | ICD-10-CM | POA: Diagnosis not present

## 2019-09-04 DIAGNOSIS — I110 Hypertensive heart disease with heart failure: Secondary | ICD-10-CM | POA: Diagnosis not present

## 2019-09-04 DIAGNOSIS — S72001D Fracture of unspecified part of neck of right femur, subsequent encounter for closed fracture with routine healing: Secondary | ICD-10-CM | POA: Diagnosis not present

## 2019-09-04 DIAGNOSIS — S72391D Other fracture of shaft of right femur, subsequent encounter for closed fracture with routine healing: Secondary | ICD-10-CM | POA: Diagnosis not present

## 2019-09-04 DIAGNOSIS — E1140 Type 2 diabetes mellitus with diabetic neuropathy, unspecified: Secondary | ICD-10-CM | POA: Diagnosis not present

## 2019-09-04 DIAGNOSIS — S8262XD Displaced fracture of lateral malleolus of left fibula, subsequent encounter for closed fracture with routine healing: Secondary | ICD-10-CM | POA: Diagnosis not present

## 2019-09-18 DIAGNOSIS — I5032 Chronic diastolic (congestive) heart failure: Secondary | ICD-10-CM | POA: Diagnosis not present

## 2019-09-18 DIAGNOSIS — S72391D Other fracture of shaft of right femur, subsequent encounter for closed fracture with routine healing: Secondary | ICD-10-CM | POA: Diagnosis not present

## 2019-09-18 DIAGNOSIS — S8262XD Displaced fracture of lateral malleolus of left fibula, subsequent encounter for closed fracture with routine healing: Secondary | ICD-10-CM | POA: Diagnosis not present

## 2019-09-18 DIAGNOSIS — I110 Hypertensive heart disease with heart failure: Secondary | ICD-10-CM | POA: Diagnosis not present

## 2019-09-18 DIAGNOSIS — S72001D Fracture of unspecified part of neck of right femur, subsequent encounter for closed fracture with routine healing: Secondary | ICD-10-CM | POA: Diagnosis not present

## 2019-09-18 DIAGNOSIS — E1140 Type 2 diabetes mellitus with diabetic neuropathy, unspecified: Secondary | ICD-10-CM | POA: Diagnosis not present

## 2019-09-24 DIAGNOSIS — G609 Hereditary and idiopathic neuropathy, unspecified: Secondary | ICD-10-CM | POA: Diagnosis not present

## 2019-09-24 DIAGNOSIS — Z23 Encounter for immunization: Secondary | ICD-10-CM | POA: Diagnosis not present

## 2019-09-24 DIAGNOSIS — I10 Essential (primary) hypertension: Secondary | ICD-10-CM | POA: Diagnosis not present

## 2019-09-24 DIAGNOSIS — E669 Obesity, unspecified: Secondary | ICD-10-CM | POA: Diagnosis not present

## 2019-09-24 DIAGNOSIS — E1165 Type 2 diabetes mellitus with hyperglycemia: Secondary | ICD-10-CM | POA: Diagnosis not present

## 2019-09-24 DIAGNOSIS — E7800 Pure hypercholesterolemia, unspecified: Secondary | ICD-10-CM | POA: Diagnosis not present

## 2019-09-24 DIAGNOSIS — Z9641 Presence of insulin pump (external) (internal): Secondary | ICD-10-CM | POA: Diagnosis not present

## 2019-09-25 DIAGNOSIS — S72001D Fracture of unspecified part of neck of right femur, subsequent encounter for closed fracture with routine healing: Secondary | ICD-10-CM | POA: Diagnosis not present

## 2019-09-25 DIAGNOSIS — S72391D Other fracture of shaft of right femur, subsequent encounter for closed fracture with routine healing: Secondary | ICD-10-CM | POA: Diagnosis not present

## 2019-09-25 DIAGNOSIS — I5032 Chronic diastolic (congestive) heart failure: Secondary | ICD-10-CM | POA: Diagnosis not present

## 2019-09-25 DIAGNOSIS — E1140 Type 2 diabetes mellitus with diabetic neuropathy, unspecified: Secondary | ICD-10-CM | POA: Diagnosis not present

## 2019-09-25 DIAGNOSIS — I110 Hypertensive heart disease with heart failure: Secondary | ICD-10-CM | POA: Diagnosis not present

## 2019-09-25 DIAGNOSIS — S8262XD Displaced fracture of lateral malleolus of left fibula, subsequent encounter for closed fracture with routine healing: Secondary | ICD-10-CM | POA: Diagnosis not present

## 2019-09-27 ENCOUNTER — Encounter: Payer: Self-pay | Admitting: Cardiology

## 2019-09-27 ENCOUNTER — Other Ambulatory Visit: Payer: Self-pay

## 2019-09-27 ENCOUNTER — Ambulatory Visit (INDEPENDENT_AMBULATORY_CARE_PROVIDER_SITE_OTHER): Payer: Medicare Other | Admitting: Cardiology

## 2019-09-27 VITALS — BP 148/60 | HR 87 | Ht 59.0 in | Wt 229.0 lb

## 2019-09-27 DIAGNOSIS — E7800 Pure hypercholesterolemia, unspecified: Secondary | ICD-10-CM

## 2019-09-27 DIAGNOSIS — I10 Essential (primary) hypertension: Secondary | ICD-10-CM | POA: Diagnosis not present

## 2019-09-27 NOTE — Patient Instructions (Signed)
Medication Instructions:  The current medical regimen is effective;  continue present plan and medications.  *If you need a refill on your cardiac medications before your next appointment, please call your pharmacy*  Follow-Up: At [facility], you and your health needs are our priority.  As part of our continuing mission to provide you with exceptional heart care, we have created designated Provider Care Teams.  These Care Teams include your primary Cardiologist (physician) and Advanced Practice Providers (APPs -  Physician Assistants and Nurse Practitioners) who all work together to provide you with the care you need, when you need it.  Your next appointment:   12 month(s)  The format for your next appointment:   In Person  Provider:   Mark Skains, MD   Thank you for choosing [facility]!!     

## 2019-09-27 NOTE — Progress Notes (Signed)
[place]. [address], [address], [place]  [address] Phone: [phone] Fax:  [phone]  Date:  09/27/2019   ID:  Jessica [name] [birth year], MRN [id number]  PCP:  Jessica Caraway, MD   History of Present Illness: Jessica [name] is a 73 y.o. female with hx of obesity, hypertension, and hyperlipidemia. 02/23/13 here for follow up.   In 2010, cardiac catheterization showed no evidence of coronary artery disease. Normal ejection fraction  She has been going to outpatient rehabilitation for muscle weakness secondary to repeated falls. She is also seen nutrition for diabetes.  She has a GYN surgery planned by Dr. Renaee Munda. Dysfunctional uterine bleeding. She is not been experiencing any change in symptoms. No excessive shortness of breath, no chest pain except for occasional atypical muscle aches, no syncope, no fevers. She takes iron supplement and vitamin C for anemia. Her hemoglobin was 14.  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  Echocardiogram -EF 65-70%, no WMA, but increase LA pressures.   She uses CPAP nightly. During her sleep study by Dr. Constance Holster, she did have pauses she states.  Has see nutritionist as well  Prior vertigo. Cataract surgery. Tachycardia noted with ambulation.  Her father was featured in the life section of the news and record on 04/02/2016. 71 years old.  06/27/2018-since we last visited, she had her gynecologic surgery by Dr. Landry Mellow.  Overall doing well.  She is also been battling with hip pain.  Been undergoing physical therapy. Still taking care of 8 year of father.  Overall still having some left hand cramping, left flank cramping.  No significant change or shortness of breath.  Utilizes a walker for stability.  Wears bilateral ankle braces for stability and fluid as well.  She has been taking half the dose of Lasix.  Denies any chest pain.  No fevers chills nausea.  09/27/19 -here for the follow-up of hypertension hyperlipidemia.   Father died 6.  Had a fall where she slipped on her urine in the bathroom and broke her leg.  Denies any fevers chills nausea vomiting syncope bleeding.  Compliant with her meds.  Wt Readings from Last 3 Encounters:  09/27/19 229 lb (103.9 kg)  07/26/19 225 lb 6.4 oz (102.2 kg)  07/20/19 222 lb 3.2 oz (100.8 kg)     Past Medical History:  Diagnosis Date  . Anemia   . Anxiety   . Arthritis   . Cataract   . Depression   . Diabetes mellitus without complication ([place])   . Diastolic dysfunction   . Frozen shoulder   . Glaucoma   . Headache    History of migraines  . Heart murmur   . History of kidney stones   . Hyperlipidemia   . Hypertension   . Macular degeneration   . Neuropathy   . Obesity   . Sleep apnea   . Stroke [facility])    has been told by a Dr. Mort Sawyers that she may have had a mini stroke but no further testing was done to confirm  . Tachycardia     Past Surgical History:  Procedure Laterality Date  . CARDIAC CATHETERIZATION     -6/09-normal ejection fraction 65%, no angiographically significant CAD., M. Elloise Roark MD  . CESAREAN SECTION    . COLONOSCOPY    . foot spurs removed    . HYSTEROSCOPY W/D&C N/A 06/28/2017   Procedure: DILATATION AND CURETTAGE /HYSTEROSCOPY/POLYPECTOMY WITH MYOSURE;  Surgeon: Christophe Louis, MD;  Location: [facility];  Service: Gynecology;  Laterality: N/A;  . JOINT REPLACEMENT     right knee replaced  . knee spurs removed    . ORIF FEMUR FRACTURE Right 04/19/2019   Procedure: OPEN REDUCTION INTERNAL FIXATION (ORIF) DISTAL FEMUR FRACTURE;  Surgeon: Rod Can, MD;  Location: [facility];  Service: Orthopedics;  Laterality: Right;  . shoulder spurs removed    . TONSILLECTOMY AND ADENOIDECTOMY    . UPPER GI ENDOSCOPY      Current Outpatient Medications  Medication Sig Dispense Refill  . acetaminophen (TYLENOL) 325 MG tablet Take 2 tablets (650 mg total) by mouth every 6 (six) hours as needed for mild pain. Do not take more than 4000mg  of tylenol  per day    . B-D INS SYRINGE 0.5CC/31GX5/16 31G X 5/16" 0.5 ML MISC Use as directed per provider.    . Cholecalciferol (VITAMIN D3) 50 MCG (2000 UT) capsule Take 2,000 Units by mouth daily.    . Coenzyme Q10 (CO Q-10) 100 MG CAPS Take 100 mg by mouth at bedtime.     Marland Kitchen DIMETHICONE, TOPICAL, 2 % CREA MEDERMA CREAM Apply topically to right hip to knee scar Qhs    . dorzolamide-timolol (COSOPT) 22.3-6.8 MG/ML ophthalmic solution Place 1 drop into both eyes 2 (two) times daily.   4  . furosemide (LASIX) 20 MG tablet Take 1 tablet (20 mg total) by mouth daily. 90 tablet 3  . insulin lispro (HUMALOG) 100 UNIT/ML injection Insulin pump 10 mL 0  . INVOKANA 300 MG TABS tablet Take 1 tablet (300 mg total) by mouth daily. 30 tablet 0  . Iron-Vitamin C (VITRON-C) 65-125 MG TABS Take 1 tablet by mouth at bedtime.     Marland Kitchen losartan (COZAAR) 100 MG tablet Take 1 tablet (100 mg total) by mouth daily. 90 tablet 0  . metFORMIN (GLUCOPHAGE-XR) 500 MG 24 hr tablet Take 1 tablet (500 mg total) by mouth at bedtime. 30 tablet 0  . metoprolol tartrate (LOPRESSOR) 100 MG tablet TAKE 1 TABLET TWICE A DAY 60 tablet 0  . Multiple Vitamins-Minerals (PRESERVISION AREDS 2 PO) Take 1 tablet by mouth 2 (two) times daily.    . NON FORMULARY REGULAR, HEART HEALTHY - DIABETIC DIET    . ONE TOUCH ULTRA TEST test strip 1 each by Other route as needed (Use as directed per provider).   11  . polyethylene glycol (MIRALAX / GLYCOLAX) 17 g packet Take 17 g by mouth daily.    . simvastatin (ZOCOR) 40 MG tablet Take 1 tablet (40 mg total) by mouth every evening. 30 tablet 0  . spironolactone (ALDACTONE) 25 MG tablet Take 1 tablet (25 mg total) by mouth daily. 90 tablet 3  . Travoprost, BAK Free, (TRAVATAN) 0.004 % SOLN ophthalmic solution Place 1 drop into both eyes at bedtime.      No current facility-administered medications for this visit.     Allergies:    Allergies  Allergen Reactions  . Meloxicam     Having bleeding  . Percocet  [Oxycodone-Acetaminophen] Other (See Comments)    Headache  . Tramadol   . Vicodin [Hydrocodone-Acetaminophen] Other (See Comments)    Headache   . Azithromycin Rash  . Iodine Rash    Social History:  The patient  reports that she has never smoked. She has never used smokeless tobacco. She reports that she does not drink alcohol or use drugs.   ROS:  Please see the history of present illness.  Denies any fevers, chills, orthopnea, PND.   PHYSICAL EXAM: VS:  BP (!) 148/60   Pulse 87   Ht 4\' 11"  (1.499 m)   Wt 229 lb (103.9 kg)   LMP  (LMP Unknown)   SpO2 98%   BMI 46.25 kg/m  GEN: Well nourished, well developed, in no acute distress , obese HEENT: normal  Neck: no JVD, carotid bruits, or masses Cardiac: RRR; no murmurs, rubs, or gallops,no edema  Respiratory:  clear to auscultation bilaterally, normal work of breathing GI: soft, nontender, nondistended, + BS MS: no deformity or atrophy  Skin: warm and dry, no rash Neuro:  Alert and Oriented x 3, Strength and sensation are intact Psych: euthymic mood, full affect   EKG-06/27/2018-sinus rhythm 96 nonspecific ST-T wave changes no other abnormalities personally viewed-prior 06/20/17-sinus rhythm, left axis deviation, no other abnormalities personally viewed 11/26/15-sinus rhythm, 77, no other abnormalities personally viewed-prior 03/06/14-normal rhythm, 79, poor R wave progression, no other abnormalities.       ASSESSMENT AND PLAN:  Femur fracture Post repair, same leg his knee replacement.  Nursing home stay rehab.  Back at home now.  Morbid obesity -Continues to work hard on weight loss.  She is in a competition with her sister she states.  Increase vegetables, decrease caloric intake. Noom weight loss.   Essential hypertension -Currently on multidrug regimen, well controlled.  She asked about coming back off of some of the medications again.  She has been taking her Lasix, half dose most of the time.  Just continue to watch  her weights and make sure she is not gaining any fluid weight.  I am okay with decreasing the Lasix to half dose.  I would also like for her to continue with the Aldactone as she has had problems in the past with hypokalemia.  She still experiences some cramping in her left flank and left hand at times.   Chronic left-sided flank pain -Likely musculoskeletal.  Heart cath 2010 reassuring, no CAD.  Hematuria/uterine bleeding/fibroids -She is come off of her low-dose aspirin.  I am fine with this given her bleeding episodes and no issues with coronary artery disease.  Diabetes with hypertension - Continues to work with endocrinology, Dr. Chalmers Cater, has insulin pump.  Hemoglobin A1c 6.5.  Excellent.  Hyperlipidemia - Simvastatin, excellent, LDL 46.  Continue.  No changes made.   57-month follow-up.  Signed, Candee Furbish, MD [facility]  09/27/2019 5:27 PM

## 2019-10-12 ENCOUNTER — Other Ambulatory Visit: Payer: Self-pay | Admitting: Cardiology

## 2019-10-12 DIAGNOSIS — I5032 Chronic diastolic (congestive) heart failure: Secondary | ICD-10-CM

## 2019-10-29 DIAGNOSIS — G479 Sleep disorder, unspecified: Secondary | ICD-10-CM | POA: Diagnosis not present

## 2019-10-29 DIAGNOSIS — N390 Urinary tract infection, site not specified: Secondary | ICD-10-CM | POA: Diagnosis not present

## 2019-10-29 DIAGNOSIS — G629 Polyneuropathy, unspecified: Secondary | ICD-10-CM | POA: Diagnosis not present

## 2019-10-29 DIAGNOSIS — E782 Mixed hyperlipidemia: Secondary | ICD-10-CM | POA: Diagnosis not present

## 2019-10-29 DIAGNOSIS — G4733 Obstructive sleep apnea (adult) (pediatric): Secondary | ICD-10-CM | POA: Diagnosis not present

## 2019-10-29 DIAGNOSIS — Z0000 Encounter for general adult medical examination without abnormal findings: Secondary | ICD-10-CM | POA: Diagnosis not present

## 2019-10-29 DIAGNOSIS — H4089 Other specified glaucoma: Secondary | ICD-10-CM | POA: Diagnosis not present

## 2019-10-29 DIAGNOSIS — R296 Repeated falls: Secondary | ICD-10-CM | POA: Diagnosis not present

## 2019-10-29 DIAGNOSIS — R2689 Other abnormalities of gait and mobility: Secondary | ICD-10-CM | POA: Diagnosis not present

## 2019-10-29 DIAGNOSIS — E1140 Type 2 diabetes mellitus with diabetic neuropathy, unspecified: Secondary | ICD-10-CM | POA: Diagnosis not present

## 2019-10-29 DIAGNOSIS — I10 Essential (primary) hypertension: Secondary | ICD-10-CM | POA: Diagnosis not present

## 2019-11-06 ENCOUNTER — Other Ambulatory Visit: Payer: Self-pay | Admitting: Cardiology

## 2019-11-06 DIAGNOSIS — I10 Essential (primary) hypertension: Secondary | ICD-10-CM

## 2019-11-06 DIAGNOSIS — I5032 Chronic diastolic (congestive) heart failure: Secondary | ICD-10-CM

## 2019-11-06 MED ORDER — METOPROLOL TARTRATE 100 MG PO TABS: 100.0000 mg | ORAL_TABLET | Freq: Two times a day (BID) | ORAL | 3 refills | Status: DC

## 2019-11-06 NOTE — Telephone Encounter (Signed)
Pt's medication was sent to pt's pharmacy as requested. Confirmation received.  °

## 2019-11-28 DIAGNOSIS — G609 Hereditary and idiopathic neuropathy, unspecified: Secondary | ICD-10-CM | POA: Diagnosis not present

## 2019-11-28 DIAGNOSIS — E1165 Type 2 diabetes mellitus with hyperglycemia: Secondary | ICD-10-CM | POA: Diagnosis not present

## 2019-11-28 DIAGNOSIS — E7800 Pure hypercholesterolemia, unspecified: Secondary | ICD-10-CM | POA: Diagnosis not present

## 2019-11-28 DIAGNOSIS — Z9641 Presence of insulin pump (external) (internal): Secondary | ICD-10-CM | POA: Diagnosis not present

## 2019-11-28 DIAGNOSIS — I10 Essential (primary) hypertension: Secondary | ICD-10-CM | POA: Diagnosis not present

## 2019-11-28 DIAGNOSIS — E669 Obesity, unspecified: Secondary | ICD-10-CM | POA: Diagnosis not present

## 2019-11-30 DIAGNOSIS — S72451D Displaced supracondylar fracture without intracondylar extension of lower end of right femur, subsequent encounter for closed fracture with routine healing: Secondary | ICD-10-CM | POA: Diagnosis not present

## 2019-11-30 DIAGNOSIS — S8265XD Nondisplaced fracture of lateral malleolus of left fibula, subsequent encounter for closed fracture with routine healing: Secondary | ICD-10-CM | POA: Diagnosis not present

## 2020-01-18 ENCOUNTER — Other Ambulatory Visit: Payer: Self-pay | Admitting: Cardiology

## 2020-01-18 DIAGNOSIS — I10 Essential (primary) hypertension: Secondary | ICD-10-CM

## 2020-02-27 DIAGNOSIS — Z9641 Presence of insulin pump (external) (internal): Secondary | ICD-10-CM | POA: Diagnosis not present

## 2020-02-27 DIAGNOSIS — E669 Obesity, unspecified: Secondary | ICD-10-CM | POA: Diagnosis not present

## 2020-02-27 DIAGNOSIS — I10 Essential (primary) hypertension: Secondary | ICD-10-CM | POA: Diagnosis not present

## 2020-02-27 DIAGNOSIS — G609 Hereditary and idiopathic neuropathy, unspecified: Secondary | ICD-10-CM | POA: Diagnosis not present

## 2020-02-27 DIAGNOSIS — E1165 Type 2 diabetes mellitus with hyperglycemia: Secondary | ICD-10-CM | POA: Diagnosis not present

## 2020-02-27 DIAGNOSIS — E7800 Pure hypercholesterolemia, unspecified: Secondary | ICD-10-CM | POA: Diagnosis not present

## 2020-05-28 DIAGNOSIS — I10 Essential (primary) hypertension: Secondary | ICD-10-CM | POA: Diagnosis not present

## 2020-05-28 DIAGNOSIS — E1165 Type 2 diabetes mellitus with hyperglycemia: Secondary | ICD-10-CM | POA: Diagnosis not present

## 2020-05-28 DIAGNOSIS — G609 Hereditary and idiopathic neuropathy, unspecified: Secondary | ICD-10-CM | POA: Diagnosis not present

## 2020-05-28 DIAGNOSIS — E669 Obesity, unspecified: Secondary | ICD-10-CM | POA: Diagnosis not present

## 2020-05-28 DIAGNOSIS — E7800 Pure hypercholesterolemia, unspecified: Secondary | ICD-10-CM | POA: Diagnosis not present

## 2020-05-28 DIAGNOSIS — Z9641 Presence of insulin pump (external) (internal): Secondary | ICD-10-CM | POA: Diagnosis not present

## 2020-06-27 IMAGING — DX RIGHT FEMUR PORTABLE 2 VIEW
4 series · 4 of 4 positions shown · non-contrast
Comparison: 04/17/2019

CLINICAL DATA: Postop

EXAM:
RIGHT FEMUR PORTABLE 2 VIEW

[femur ap]
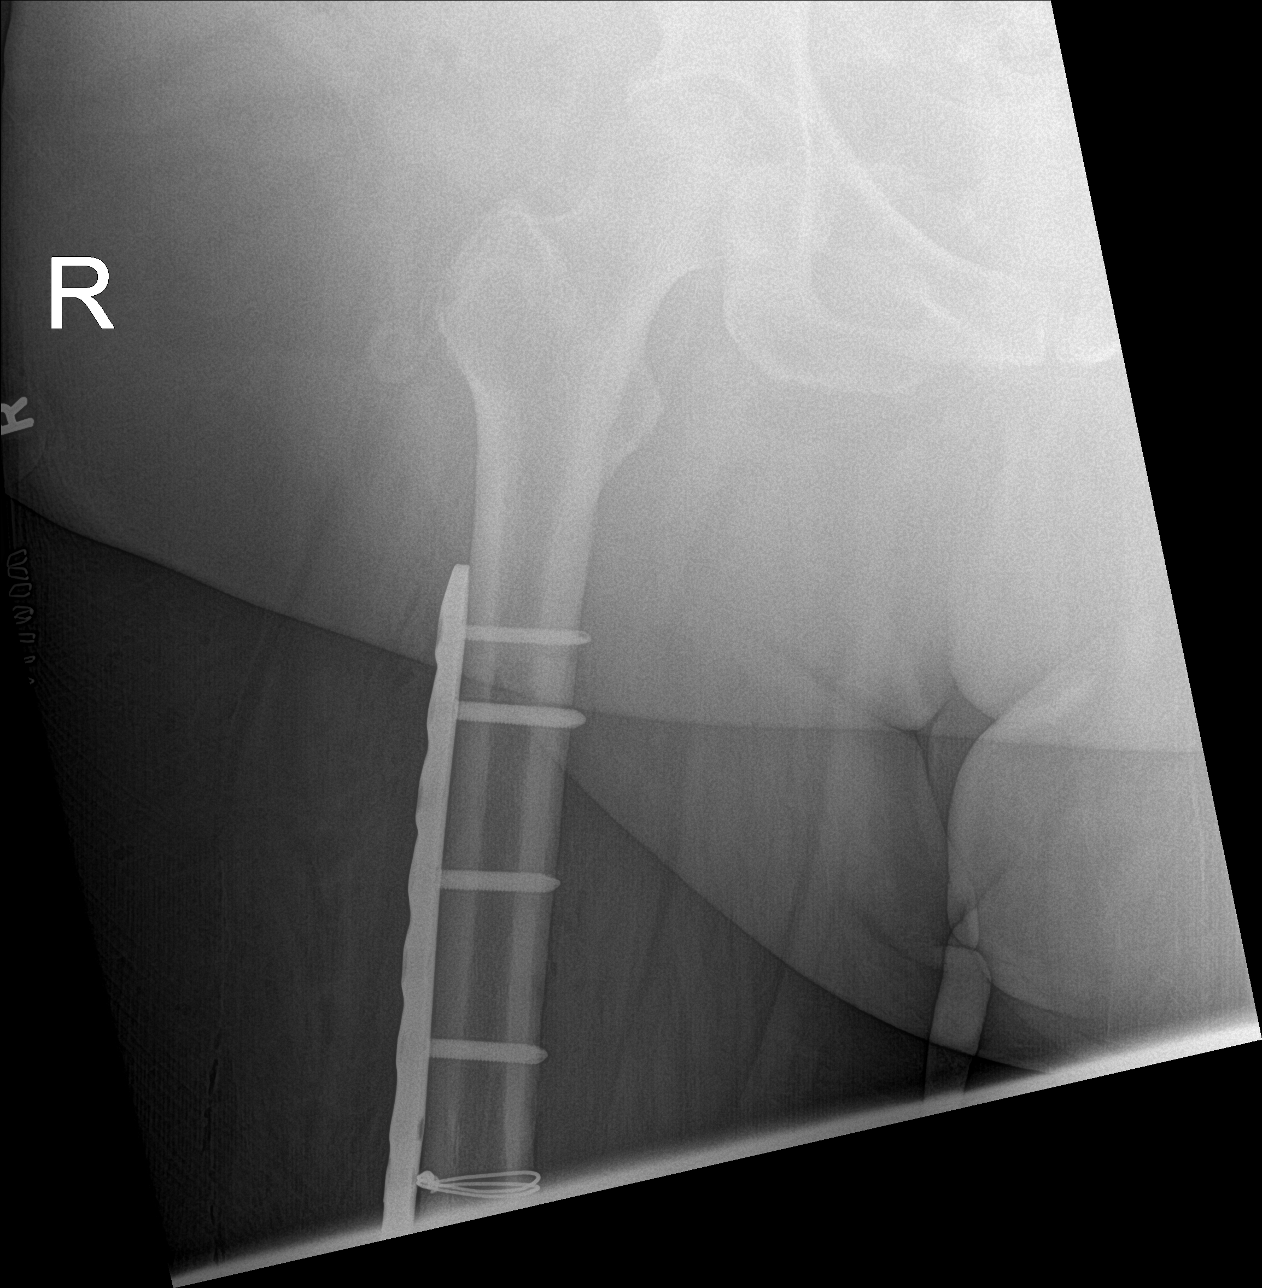

[femur lat (1 of 3)]
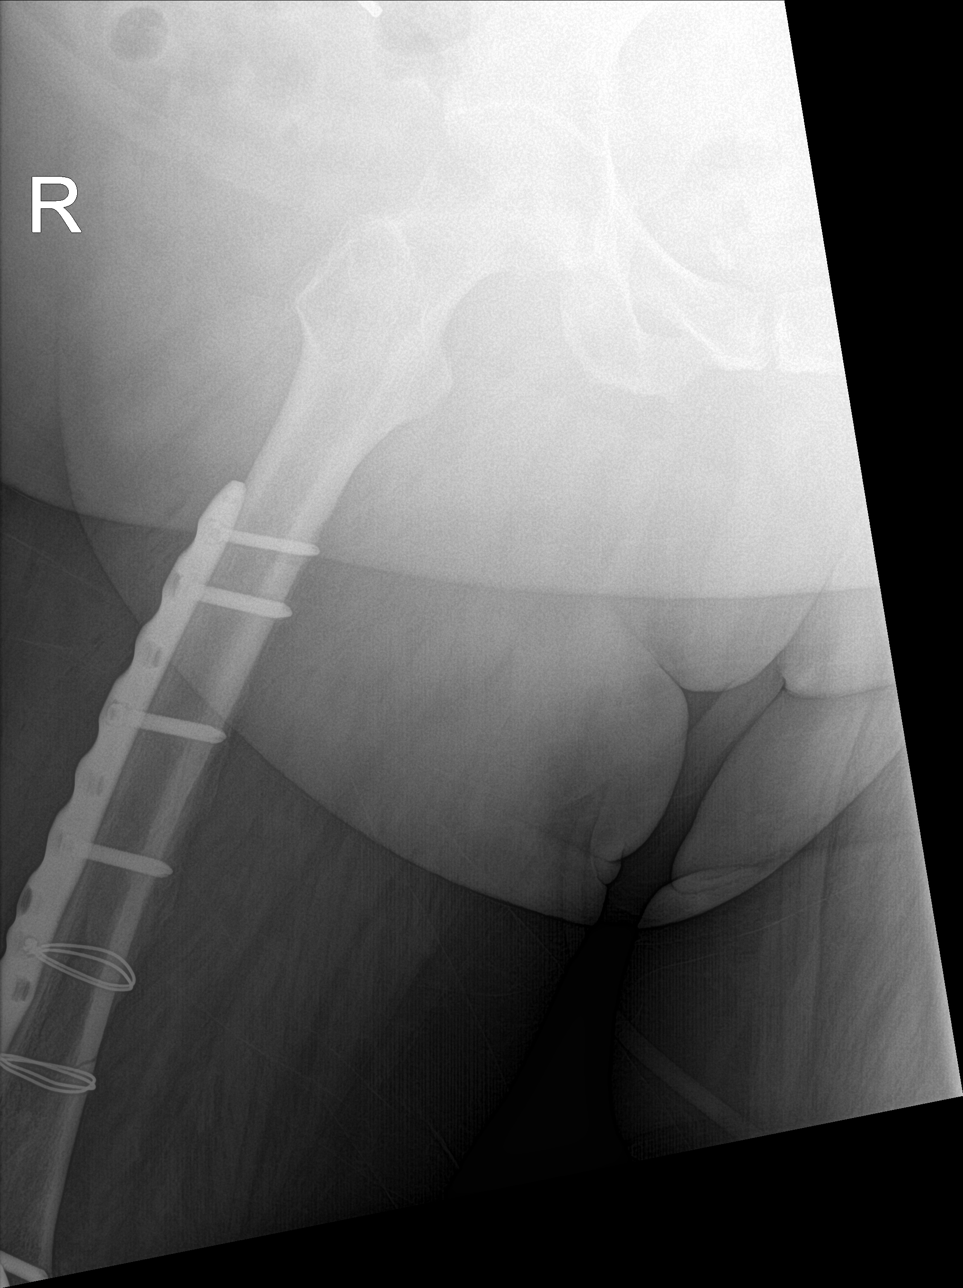

[femur lat (2 of 3)]
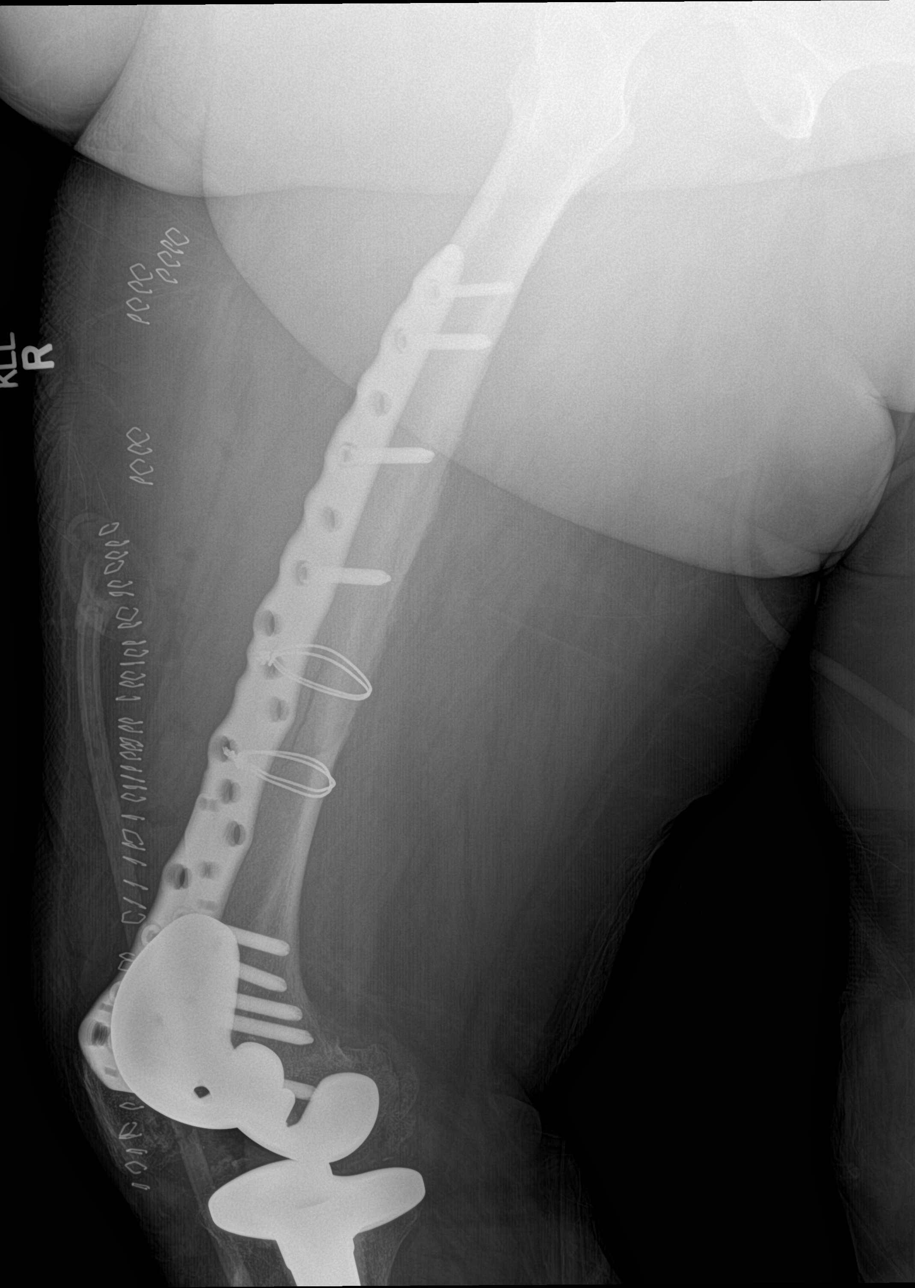

[femur lat (3 of 3)]
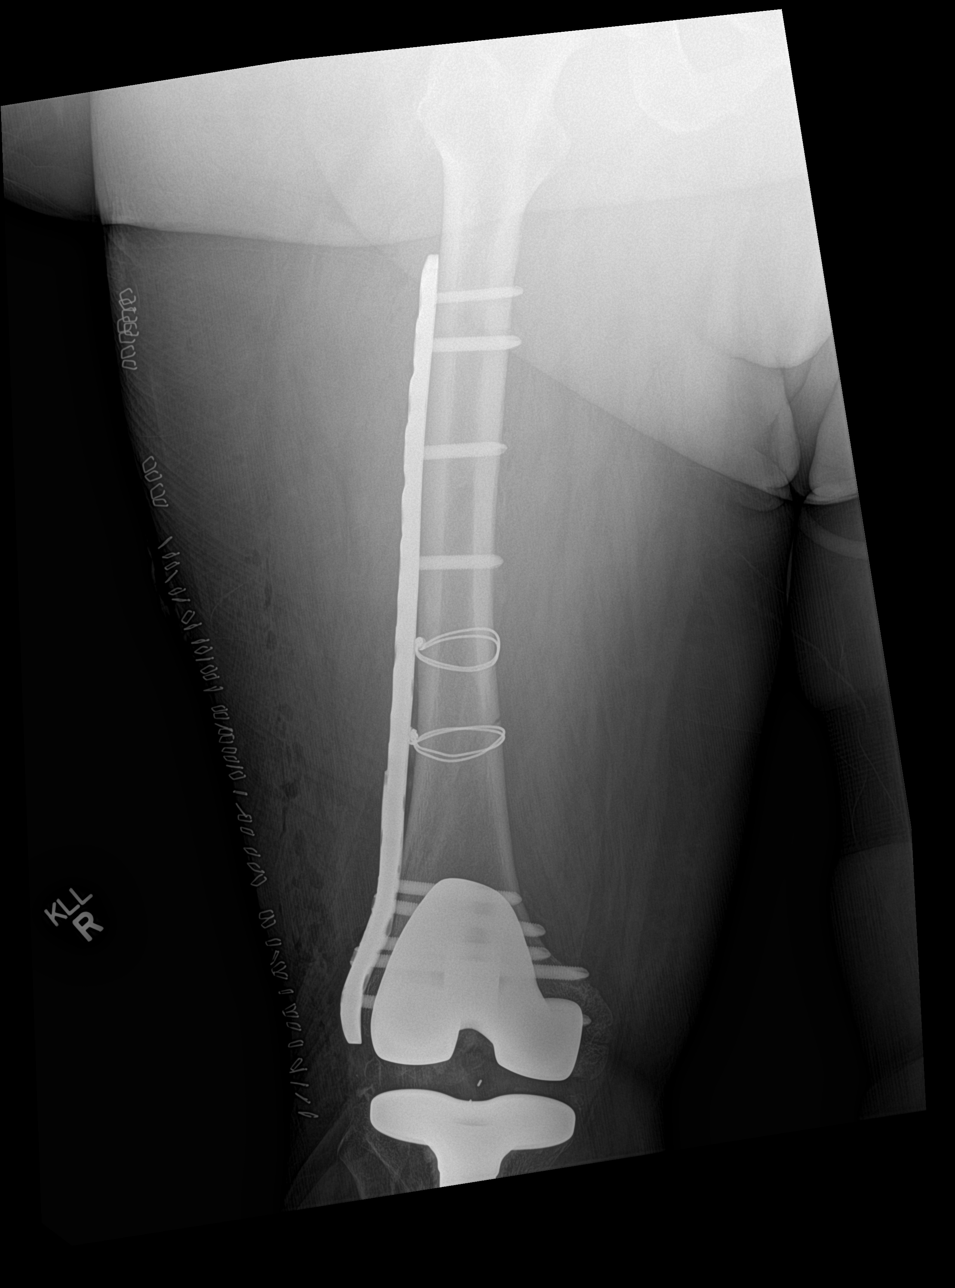

[4 of 4 positions shown; findings below may reference images not displayed]

FINDINGS: Lateral cutaneous staples. Interval surgical plate, screw and
cerclage wire fixation of the femur from the proximal shaft to the
knee replacement. This bridges acute comminuted distal femoral
fracture. Reduction of fracture displacement, now with anatomic
alignment. Prior right knee replacement. Gas in the soft tissues
consistent with recent surgery.
IMPRESSION: Interval internal fixation of comminuted distal femoral fracture,
now with anatomic alignment. Expected postsurgical changes

## 2020-06-27 IMAGING — RF RIGHT FEMUR 2 VIEWS
1 series · 4 of 4 positions shown · non-contrast
Comparison: 04/17/2019

CLINICAL DATA: Femur fracture

EXAM:
RIGHT FEMUR 2 VIEWS

[Series 1: unknown protocol · 0.20mm/px · 4 of 4 slices shown]
[im 1/4]
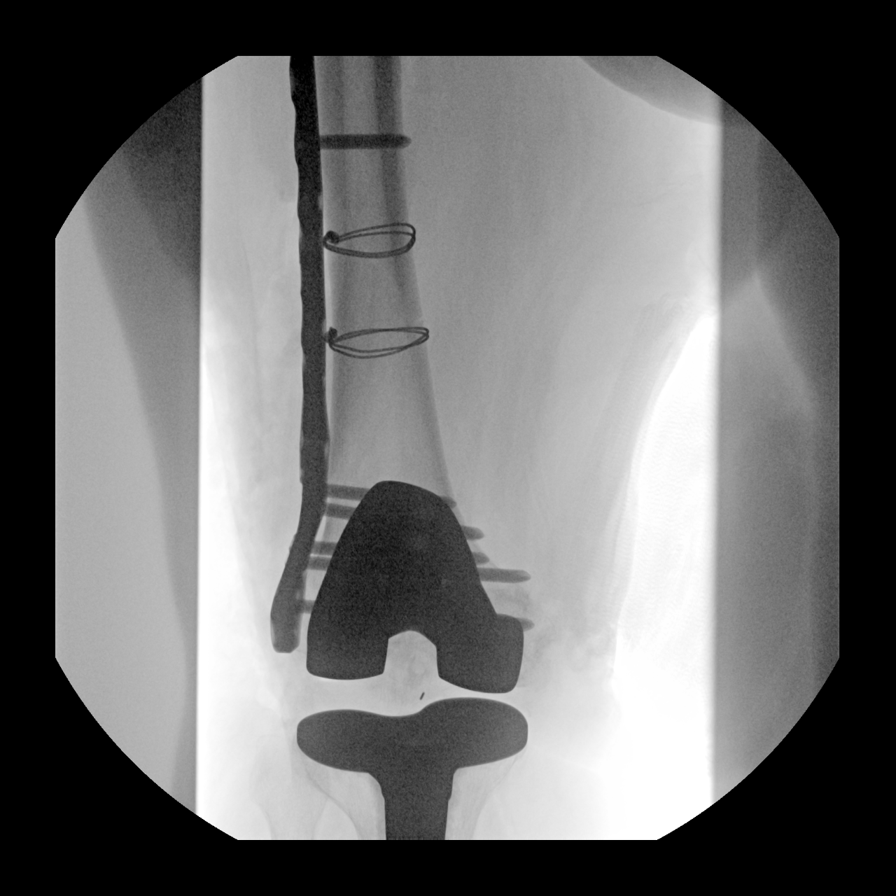
[im 2/4]
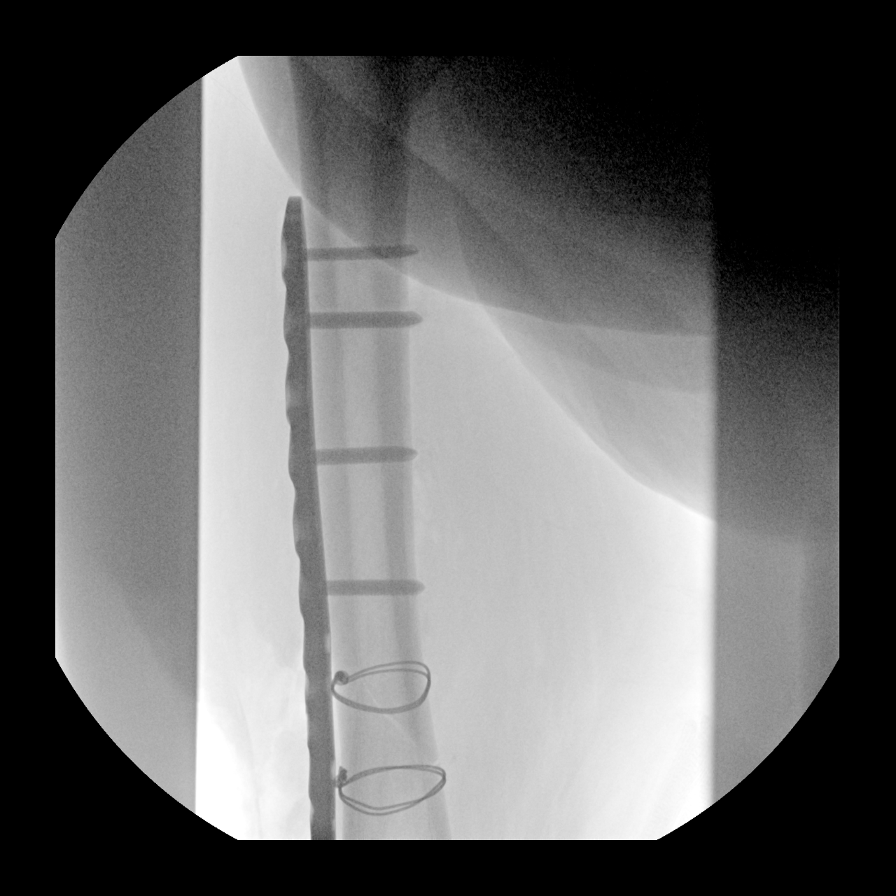
[im 3/4]
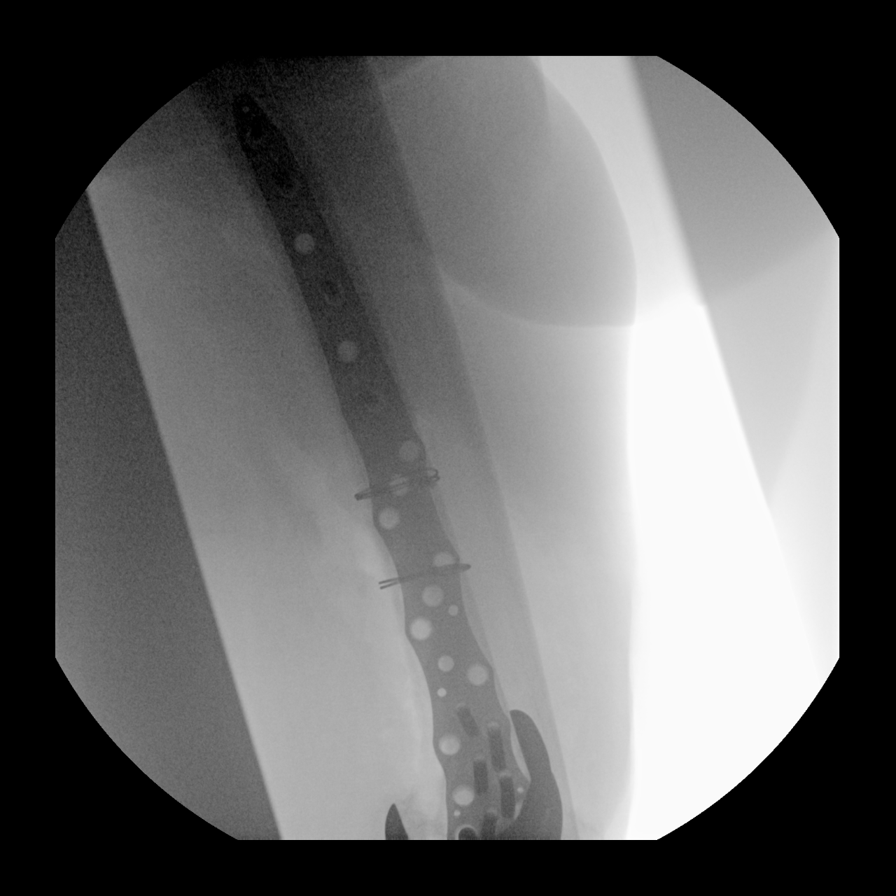
[im 4/4]
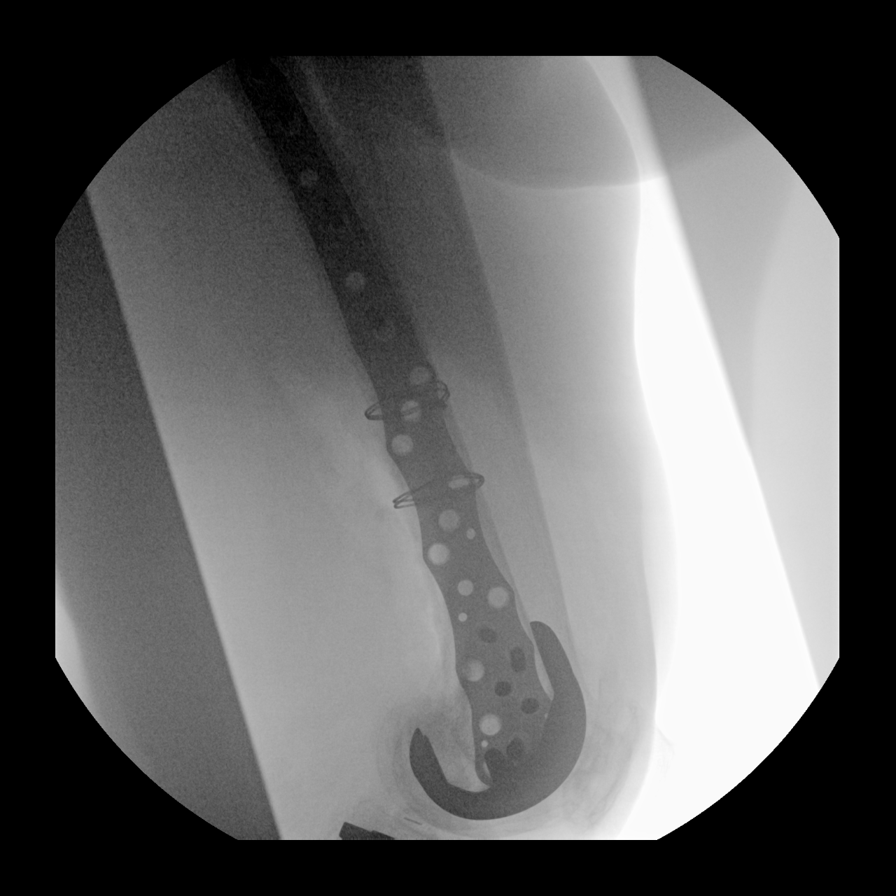

[4 of 4 positions shown; findings below may reference images not displayed]

FINDINGS: Four low resolution intraoperative spot views of the right femur.
Total fluoroscopy time was 1 minutes 25 seconds. Prior right knee
replacement. Surgical plate and screw fixation of the femur for
comminuted distal femoral fracture. Placement of cerclage wires at
the distal femur. Anatomic alignment is noted.
IMPRESSION: Intraoperative fluoroscopic assistance provided during internal
fixation of distal femoral fracture

## 2020-08-13 ENCOUNTER — Emergency Department (HOSPITAL_COMMUNITY): Payer: Medicare Other

## 2020-08-13 ENCOUNTER — Emergency Department (HOSPITAL_COMMUNITY)
Admission: EM | Admit: 2020-08-13 | Discharge: 2020-08-13 | Disposition: A | Discharge status: Home / Self Care | Payer: Medicare Other | Attending: Emergency Medicine | Admitting: Emergency Medicine

## 2020-08-13 ENCOUNTER — Other Ambulatory Visit: Payer: Self-pay

## 2020-08-13 DIAGNOSIS — S7001XA Contusion of right hip, initial encounter: Secondary | ICD-10-CM | POA: Diagnosis not present

## 2020-08-13 DIAGNOSIS — Z7984 Long term (current) use of oral hypoglycemic drugs: Secondary | ICD-10-CM | POA: Diagnosis not present

## 2020-08-13 DIAGNOSIS — Z20822 Contact with and (suspected) exposure to covid-19: Secondary | ICD-10-CM | POA: Diagnosis not present

## 2020-08-13 DIAGNOSIS — R102 Pelvic and perineal pain: Secondary | ICD-10-CM | POA: Diagnosis not present

## 2020-08-13 DIAGNOSIS — R52 Pain, unspecified: Secondary | ICD-10-CM | POA: Diagnosis not present

## 2020-08-13 DIAGNOSIS — I110 Hypertensive heart disease with heart failure: Secondary | ICD-10-CM | POA: Insufficient documentation

## 2020-08-13 DIAGNOSIS — I700 Atherosclerosis of aorta: Secondary | ICD-10-CM | POA: Insufficient documentation

## 2020-08-13 DIAGNOSIS — I509 Heart failure, unspecified: Secondary | ICD-10-CM | POA: Insufficient documentation

## 2020-08-13 DIAGNOSIS — Z96651 Presence of right artificial knee joint: Secondary | ICD-10-CM | POA: Insufficient documentation

## 2020-08-13 DIAGNOSIS — Z794 Long term (current) use of insulin: Secondary | ICD-10-CM | POA: Insufficient documentation

## 2020-08-13 DIAGNOSIS — W19XXXA Unspecified fall, initial encounter: Secondary | ICD-10-CM

## 2020-08-13 DIAGNOSIS — Z7401 Bed confinement status: Secondary | ICD-10-CM | POA: Diagnosis not present

## 2020-08-13 DIAGNOSIS — Z043 Encounter for examination and observation following other accident: Secondary | ICD-10-CM | POA: Diagnosis not present

## 2020-08-13 DIAGNOSIS — W010XXA Fall on same level from slipping, tripping and stumbling without subsequent striking against object, initial encounter: Secondary | ICD-10-CM | POA: Diagnosis not present

## 2020-08-13 DIAGNOSIS — Z79899 Other long term (current) drug therapy: Secondary | ICD-10-CM | POA: Diagnosis not present

## 2020-08-13 DIAGNOSIS — M25551 Pain in right hip: Secondary | ICD-10-CM | POA: Diagnosis not present

## 2020-08-13 DIAGNOSIS — Y92018 Other place in single-family (private) house as the place of occurrence of the external cause: Secondary | ICD-10-CM | POA: Diagnosis not present

## 2020-08-13 DIAGNOSIS — S79911A Unspecified injury of right hip, initial encounter: Secondary | ICD-10-CM | POA: Diagnosis present

## 2020-08-13 DIAGNOSIS — M545 Low back pain, unspecified: Secondary | ICD-10-CM | POA: Diagnosis not present

## 2020-08-13 DIAGNOSIS — I10 Essential (primary) hypertension: Secondary | ICD-10-CM | POA: Diagnosis not present

## 2020-08-13 DIAGNOSIS — E119 Type 2 diabetes mellitus without complications: Secondary | ICD-10-CM | POA: Diagnosis not present

## 2020-08-13 DIAGNOSIS — M2550 Pain in unspecified joint: Secondary | ICD-10-CM | POA: Diagnosis not present

## 2020-08-13 DIAGNOSIS — R0902 Hypoxemia: Secondary | ICD-10-CM | POA: Diagnosis not present

## 2020-08-13 LAB — BASIC METABOLIC PANEL
Anion gap: 11 (ref 5–15)
BUN: 18 mg/dL (ref 8–23)
CO2: 25 mmol/L (ref 22–32)
Calcium: 9.3 mg/dL (ref 8.9–10.3)
Chloride: 103 mmol/L (ref 98–111)
Creatinine, Ser: 0.81 mg/dL (ref 0.44–1.00)
GFR, Estimated: >60 mL/min (ref >60)
Glucose, Bld: 154 mg/dL — ABNORMAL HIGH (ref 70–99)
Potassium: 5.3 mmol/L — ABNORMAL HIGH (ref 3.5–5.1)
Sodium: 139 mmol/L (ref 135–145)

## 2020-08-13 LAB — CBC WITH DIFFERENTIAL/PLATELET
Abs Immature Granulocytes: 0.04 10*3/uL (ref 0.00–0.07)
Basophils Absolute: 0 10*3/uL (ref 0.0–0.1)
Basophils Relative: 0 %
Eosinophils Absolute: 0.1 10*3/uL (ref 0.0–0.5)
Eosinophils Relative: 1 %
HCT: 45.1 % (ref 36.0–46.0)
Hemoglobin: 14.3 g/dL (ref 12.0–15.0)
Immature Granulocytes: 0 %
Lymphocytes Relative: 16 %
Lymphs Abs: 1.8 10*3/uL (ref 0.7–4.0)
MCH: 28.6 pg (ref 26.0–34.0)
MCHC: 31.7 g/dL (ref 30.0–36.0)
MCV: 90.2 fL (ref 80.0–100.0)
Monocytes Absolute: 0.8 10*3/uL (ref 0.1–1.0)
Monocytes Relative: 8 %
Neutro Abs: 8 10*3/uL — ABNORMAL HIGH (ref 1.7–7.7)
Neutrophils Relative %: 75 %
Platelets: 182 10*3/uL (ref 150–400)
RBC: 5 MIL/uL (ref 3.87–5.11)
RDW: 13.9 % (ref 11.5–15.5)
WBC: 10.7 10*3/uL — ABNORMAL HIGH (ref 4.0–10.5)
nRBC: 0 % (ref 0.0–0.2)

## 2020-08-13 LAB — RESPIRATORY PANEL BY RT PCR (FLU A&B, COVID)
Influenza A by PCR: NEGATIVE
Influenza B by PCR: NEGATIVE
SARS Coronavirus 2 by RT PCR: NEGATIVE

## 2020-08-13 LAB — CBG MONITORING, ED: Glucose-Capillary: 212 mg/dL — ABNORMAL HIGH (ref 70–99)

## 2020-08-13 MED ORDER — HYDROCODONE-ACETAMINOPHEN 5-325 MG PO TABS
1.0000 | ORAL_TABLET | Freq: Four times a day (QID) | ORAL | 0 refills | Status: DC | PRN
Start: 2020-08-13 — End: 2021-04-20

## 2020-08-13 MED ORDER — FENTANYL CITRATE (PF) 100 MCG/2ML IJ SOLN
50.0000 ug | Freq: Once | INTRAMUSCULAR | Status: AC
  Administered 2020-08-13: 50 ug via INTRAVENOUS
  Filled 2020-08-13: qty 2

## 2020-08-13 MED ORDER — ONDANSETRON HCL 4 MG/2ML IJ SOLN
4.0000 mg | Freq: Once | INTRAMUSCULAR | Status: AC
  Administered 2020-08-13: 4 mg via INTRAVENOUS
  Filled 2020-08-13: qty 2

## 2020-08-13 MED ORDER — SODIUM CHLORIDE 0.9 % IV SOLN: INTRAVENOUS | Status: DC

## 2020-08-13 NOTE — ED Notes (Signed)
Pt provided with [place] sandwich, applesauce, diet ginger ale per her request.

## 2020-08-13 NOTE — ED Notes (Signed)
Patient transported to X-ray 

## 2020-08-13 NOTE — TOC Initial Note (Signed)
Transition of Care [facility]) - Initial/Assessment Note    Patient Details  Name: Jessica [name] MRN: [id number] Date of Birth: 1945/11/22  Transition of Care [facility]) CM/SW Contact:    Erenest Rasher, RN Phone Number: [phone] 08/13/2020, 5:36 PM  Clinical Narrative:                  TOC CM spoke to pt and states she prefers to go home. Offered choice for [facility]. Pt states she wants [facility] they worked well with her father before he passed away at 66 years old. Contacted [employer] rep, Tommi Rumps to explain that patient wants to start her care as soon as possible as she lives alone. She is familiar with [facility] but hoping she does not need to hire a private duty caregiver in the home. She has wheelchair, rolling walkers but would like a 3n1 bedside commode. Will have item shipped out by [facility], as DME cannot travel on ambulance. She has a friend that will take her to her appts. Orders are in [place]. Reviewed by CM.     Expected Discharge Plan: [place] Barriers to Discharge: No Barriers Identified   Patient Goals and CMS Choice Patient states their goals for this hospitalization and ongoing recovery are:: prefer to go home with Home Health CMS Medicare.gov Compare Post Acute Care list provided to:: Patient Choice offered to / list presented to : Patient  Expected Discharge Plan and Services Expected Discharge Plan: [place] In-house Referral: Clinical Social Work Discharge Planning Services: CM Consult Post Acute Care Choice: [place] arrangements for the past 2 months: [place]                 DME Arranged: 3-N-1 DME Agency: [employer] Date DME Agency Contacted: 08/13/20 Time DME Agency Contacted: [phone] Representative spoke with at DME Agency: Dawayne Patricia HH Arranged: RN, PT, OT, Nurse's Aide, Social Work [employer] Agency: [place] Date [place]: 08/13/20 Time [place]:  1727 Representative spoke with at [place]: Adela Lank  Prior Living Arrangements/Services Living arrangements for the past 2 months: [employer] with:: Self Patient language and need for interpreter reviewed:: Yes Do you feel safe going back to the place where you live?: Yes      Need for Family Participation in Patient Care: Yes (Comment) Care giver support system in place?: Yes (comment) Current home services: DME (rolling walker, wheelchair, cane) Criminal Activity/Legal Involvement Pertinent to Current Situation/Hospitalization: No - Comment as needed  Activities of Daily Living      Permission Sought/Granted Permission sought to share information with : Case Manager, PCP, Family Supports Permission granted to share information with : Yes, Verbal Permission Granted  Share Information with NAME: Teena Dunk  Permission granted to share info w AGENCY: [place] granted to share info w Relationship: daughter  Permission granted to share info w Contact Information: [phone]  Emotional Assessment       Orientation: : Oriented to Self, Oriented to Place, Oriented to  Time, Oriented to Situation   Psych Involvement: No (comment)  Admission diagnosis:  fall;right leg pain Patient Active Problem List   Diagnosis Date Noted  . Hyperlipidemia   . Diarrhea   . Scar 06/19/2019  . Vaginal yeast infection 05/26/2019  . At risk for adverse drug event 05/02/2019  . CHF (congestive heart failure) ([place]) 05/01/2019  . Pressure ulcer of sacral  region, stage 2 ([place]) 05/01/2019  . Displaced supracondylar fracture of distal end of right femur without intracondylar extension ([place]) 04/19/2019  . Closed right femoral fracture ([place]) 04/17/2019  . Sleep apnea   . Glaucoma   . Essential hypertension, benign 09/03/2014  . Morbid obesity ([place]) 03/06/2014  . HTN (hypertension) 03/06/2014  . Vertigo 03/06/2014  . Type 2 diabetes mellitus ([place]) 03/06/2014  .  Pure hypercholesterolemia 03/06/2014   PCP:  Cari Caraway, MD Pharmacy:   [facility] [place], [place] [address] [place] [address] Phone: [phone] Fax: [phone]  [facility] [place] [facility] [address] [place] [address] Phone: [phone] Fax: [place], [place] [address] [place] [address] [place] [address] Phone: [phone] Fax: [phone]     Social Determinants of Health (SDOH) Interventions    Readmission Risk Interventions No flowsheet data found.

## 2020-08-13 NOTE — ED Notes (Signed)
PTAR notified of need for transport. 

## 2020-08-13 NOTE — ED Notes (Signed)
IV start attempted x2, unsuccessful.  2nd RN requested to attempt IV start.

## 2020-08-13 NOTE — ED Triage Notes (Signed)
Pt BIBA from home-  Per EMS- Pt reports mechanical fall Monday.  Pt reports worsening pain since Monday, unable to bear weight as of last night. Called EMS this AM.  Denies LOC, denies taking blood thinners.    Hx of DM, HTN.   AOx4, ambulatory with walker at baseline.

## 2020-08-13 NOTE — ED Provider Notes (Signed)
[facility] DEPT Provider Note   CSN: [id number] Arrival date & time: 08/13/20  1011     History Chief Complaint  Patient presents with  . Fall  . Hip Pain    right    Jessica [name] is a 74 y.o. female.  HPI   Patient presents to the ED for evaluation after a fall.  Patient states she slipped and fell on Monday.  Patient felt a crunching in her right hip area but she was able to get up.  Patient was able to make it to her bed.  This fall occurred Monday evening.  The pain has increased and since last night the patient was not able to get out of bed.  She had to call EMS today.  Whenever she tries to bear weight she has severe pain in her hip and knee area.  Patient does have history of prior fractures.  Patient denies any headache.  No fevers or chills.  No chest pain or shortness of breath.  Patient had not been able to eat anything because she was not able to go to bed and she was incontinent of urine in her bed because she was not able to get out of it.  Past Medical History:  Diagnosis Date  . Anemia   . Anxiety   . Arthritis   . Cataract   . Depression   . Diabetes mellitus without complication ([place])   . Diastolic dysfunction   . Frozen shoulder   . Glaucoma   . Headache    History of migraines  . Heart murmur   . History of kidney stones   . Hyperlipidemia   . Hypertension   . Macular degeneration   . Neuropathy   . Obesity   . Sleep apnea   . Stroke [facility])    has been told by a Dr. [place] Sawyers that she may have had a mini stroke but no further testing was done to confirm  . Tachycardia     Patient Active Problem List   Diagnosis Date Noted  . Hyperlipidemia   . Diarrhea   . Scar 06/19/2019  . Vaginal yeast infection 05/26/2019  . At risk for adverse drug event 05/02/2019  . CHF (congestive heart failure) ([place]) 05/01/2019  . Pressure ulcer of sacral region, stage 2 ([place]) 05/01/2019  . Displaced supracondylar fracture of  distal end of right femur without intracondylar extension ([place]) 04/19/2019  . Closed right femoral fracture ([place]) 04/17/2019  . Sleep apnea   . Glaucoma   . Essential hypertension, benign 09/03/2014  . Morbid obesity ([place]) 03/06/2014  . HTN (hypertension) 03/06/2014  . Vertigo 03/06/2014  . Type 2 diabetes mellitus ([place]) 03/06/2014  . Pure hypercholesterolemia 03/06/2014    Past Surgical History:  Procedure Laterality Date  . CARDIAC CATHETERIZATION     -6/09-normal ejection fraction 65%, no angiographically significant CAD., M. Skains MD  . CESAREAN SECTION    . COLONOSCOPY    . foot spurs removed    . HYSTEROSCOPY WITH D & C N/A 06/28/2017   Procedure: DILATATION AND CURETTAGE /HYSTEROSCOPY/POLYPECTOMY WITH MYOSURE;  Surgeon: Christophe Louis, MD;  Location: [facility];  Service: Gynecology;  Laterality: N/A;  . JOINT REPLACEMENT     right knee replaced  . knee spurs removed    . ORIF FEMUR FRACTURE Right 04/19/2019   Procedure: OPEN REDUCTION INTERNAL FIXATION (ORIF) DISTAL FEMUR FRACTURE;  Surgeon: Rod Can, MD;  Location: [facility];  Service: Orthopedics;  Laterality:  Right;  . shoulder spurs removed    . TONSILLECTOMY AND ADENOIDECTOMY    . UPPER GI ENDOSCOPY       OB History   No obstetric history on file.     Family History  Problem Relation Age of Onset  . Colon polyps Father   . Brain cancer Mother     Social History   Tobacco Use  . Smoking status: Never Smoker  . Smokeless tobacco: Never Used  Vaping Use  . Vaping Use: Never used  Substance Use Topics  . Alcohol use: No    Comment: maybe once a year  . Drug use: No    Home Medications Prior to Admission medications   Medication Sig Start Date End Date Taking? Authorizing Provider  acetaminophen (TYLENOL) 325 MG tablet Take 2 tablets (650 mg total) by mouth every 6 (six) hours as needed for mild pain. Do not take more than 4000mg  of tylenol per day 07/26/19  Yes Medina-Vargas, Monina C, NP  Cholecalciferol  (VITAMIN D3) 50 MCG (2000 UT) capsule Take 2,000 Units by mouth daily.   Yes [provider]  Coenzyme Q10 (CO Q-10) 100 MG CAPS Take 100 mg by mouth at bedtime.    Yes [provider]  docusate calcium (SURFAK) 240 MG capsule Take 240 mg by mouth daily.   Yes [provider]  furosemide (LASIX) 20 MG tablet TAKE 1 TABLET DAILY Patient taking differently: Take 20 mg by mouth daily.  10/15/19  Yes Jerline Pain, MD  insulin lispro (HUMALOG) 100 UNIT/ML injection Insulin pump 07/26/19  Yes Medina-Vargas, Monina C, NP  INVOKANA 300 MG TABS tablet Take 1 tablet (300 mg total) by mouth daily. 07/26/19  Yes Medina-Vargas, Monina C, NP  Iron-Vitamin C (VITRON-C) 65-125 MG TABS Take 1 tablet by mouth at bedtime.    Yes [provider]  losartan (COZAAR) 100 MG tablet TAKE 1 TABLET DAILY Patient taking differently: Take 100 mg by mouth daily.  01/18/20  Yes Jerline Pain, MD  metFORMIN (GLUCOPHAGE-XR) 500 MG 24 hr tablet Take 1 tablet (500 mg total) by mouth at bedtime. 07/26/19  Yes Medina-Vargas, Monina C, NP  metoprolol tartrate (LOPRESSOR) 100 MG tablet Take 1 tablet (100 mg total) by mouth 2 (two) times daily. 11/06/19  Yes Jerline Pain, MD  Multiple Vitamins-Minerals (PRESERVISION AREDS 2 PO) Take 1 tablet by mouth 2 (two) times daily.   Yes [provider]  simvastatin (ZOCOR) 40 MG tablet Take 1 tablet (40 mg total) by mouth every evening. 07/26/19  Yes Medina-Vargas, Monina C, NP  spironolactone (ALDACTONE) 25 MG tablet TAKE 1 TABLET DAILY Patient taking differently: Take 25 mg by mouth daily.  10/15/19  Yes Jerline Pain, MD  Travoprost, BAK Free, (TRAVATAN) 0.004 % SOLN ophthalmic solution Place 1 drop into both eyes at bedtime.    Yes [provider]  B-D INS SYRINGE 0.5CC/31GX5/16 31G X 5/16" 0.5 ML MISC Use as directed per provider. 06/11/13   [provider]  HYDROcodone-acetaminophen (NORCO/VICODIN) 5-325 MG tablet Take 1 tablet by  mouth every 6 (six) hours as needed. 08/13/20   Dorie Rank, MD  NON FORMULARY REGULAR, HEART HEALTHY - DIABETIC DIET    [provider]  ONE TOUCH ULTRA TEST test strip 1 each by Other route as needed (Use as directed per provider).  08/05/14   [provider]    Allergies    Meloxicam, Percocet [oxycodone-acetaminophen], Tramadol, Vicodin [hydrocodone-acetaminophen], Azithromycin, and Iodine  Review  of Systems   Review of Systems  All other systems reviewed and are negative.   Physical Exam Updated Vital Signs BP 128/76   Pulse 89   Temp 99.3 F (37.4 C) (Oral)   Resp 16   Ht 1.499 m (4\' 11" )   Wt 104.3 kg   LMP  (LMP Unknown)   SpO2 100%   BMI 46.45 kg/m   Physical Exam Vitals and nursing note reviewed.  Constitutional:      General: She is not in acute distress.    Appearance: She is well-developed.  HENT:     Head: Normocephalic and atraumatic.     Right Ear: External ear normal.     Left Ear: External ear normal.  Eyes:     General: No scleral icterus.       Right eye: No discharge.        Left eye: No discharge.     Conjunctiva/sclera: Conjunctivae normal.  Neck:     Trachea: No tracheal deviation.  Cardiovascular:     Rate and Rhythm: Normal rate and regular rhythm.  Pulmonary:     Effort: Pulmonary effort is normal. No respiratory distress.     Breath sounds: Normal breath sounds. No stridor. No wheezing or rales.  Abdominal:     General: Bowel sounds are normal. There is no distension.     Palpations: Abdomen is soft.     Tenderness: There is no abdominal tenderness. There is no guarding or rebound.  Musculoskeletal:     Cervical back: Neck supple.     Right hip: Tenderness present. No deformity.     Right upper leg: Tenderness present. No swelling or deformity.     Right knee: No deformity.  Skin:    General: Skin is warm and dry.     Findings: No rash.  Neurological:     Mental Status: She is alert.     Cranial Nerves: No  cranial nerve deficit (no facial droop, extraocular movements intact, no slurred speech).     Sensory: No sensory deficit.     Motor: No abnormal muscle tone or seizure activity.     Coordination: Coordination normal.     ED Results / Procedures / Treatments   Labs (all labs ordered are listed, but only abnormal results are displayed) Labs Reviewed  BASIC METABOLIC PANEL - Abnormal; Notable for the following components:      Result Value   Potassium 5.3 (*)    Glucose, Bld 154 (*)    All other components within normal limits  CBC WITH DIFFERENTIAL/PLATELET - Abnormal; Notable for the following components:   WBC 10.7 (*)    Neutro Abs 8.0 (*)    All other components within normal limits  RESPIRATORY PANEL BY RT PCR (FLU A&B, COVID)  CBC WITH DIFFERENTIAL/PLATELET  PROTIME-INR  TYPE AND SCREEN    EKG EKG Interpretation  Date/Time:  Wednesday August 13 2020 12:24:07 EDT Ventricular Rate:  98 PR Interval:    QRS Duration: 99 QT Interval:  354 QTC Calculation: 452 R Axis:   2 Text Interpretation: Sinus rhythm Confirmed by Madalyn Rob [phone]) on 08/13/2020 4:41:17 PM   Radiology DG Lumbar Spine Complete  Result Date: 08/13/2020 CLINICAL DATA:  Pain following fall EXAM: LUMBAR SPINE - COMPLETE 4+ VIEW COMPARISON:  None. FINDINGS: Frontal, lateral, and bilateral oblique views were obtained. No evident fracture or spondylolisthesis. The disc spaces appear unremarkable. There is facet osteoarthritic change at L3-4, L4-5, and L5-S1 bilaterally. 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 No appreciable disc space narrowing. No evident fracture or spondylolisthesis. Electronically Signed   By: Lowella Grip III M.D.   On: 08/13/2020 12:14   CT PELVIS WO CONTRAST  Result Date: 08/13/2020 CLINICAL DATA:  Pelvic pain after fall EXAM: CT PELVIS WITHOUT CONTRAST TECHNIQUE: Multidetector CT imaging of the pelvis was performed following the  standard protocol without intravenous contrast. COMPARISON:  Same-day right hip x-ray FINDINGS: Urinary Tract:  No abnormality visualized. Bowel:  Unremarkable visualized pelvic bowel loops. Vascular/Lymphatic: Aortoiliac atherosclerosis. No evidence of aneurysm. No pelvic or inguinal lymphadenopathy. Reproductive:  Uterus and adnexal regions are within normal limits. Other:  No intrapelvic free fluid. Musculoskeletal: No acute fracture. SI joints and pubic symphysis are intact without diastasis. Bilateral hip joints are intact without dislocation. Enthesopathic changes within the bilateral hips and pelvis. Facet predominant lower lumbar spondylosis. Partially visualized right femoral diaphyseal fixation hardware. No soft tissue fluid collection or hematoma. IMPRESSION: 1. No acute osseous abnormality of the pelvis. 2. No soft tissue fluid collection or hematoma. 3. Aortic atherosclerosis. (ICD10-I70.0). Electronically Signed   By: Davina Poke D.O.   On: 08/13/2020 16:22   DG Chest Port 1 View  Result Date: 08/13/2020 CLINICAL DATA:  Fall 2 days ago. EXAM: PORTABLE CHEST 1 VIEW COMPARISON:  04/29/2019 FINDINGS: The heart size and mediastinal contours are within normal limits. Both lungs are clear. The visualized skeletal structures are unremarkable. IMPRESSION: No active disease. Electronically Signed   By: Franchot Gallo M.D.   On: 08/13/2020 12:14   DG Knee Complete 4 Views Right  Result Date: 08/13/2020 CLINICAL DATA:  Fall 2 days ago. EXAM: RIGHT KNEE - COMPLETE 4+ VIEW COMPARISON:  04/19/2019 FINDINGS: Total knee arthroplasty.  Prosthesis in satisfactory position. Lateral plate and screw fixation of femur for fracture. Small fracture line remains evident indicative of incomplete bony healing. Negative for acute fracture.  No joint effusion. IMPRESSION: Negative for acute fracture. Electronically Signed   By: Franchot Gallo M.D.   On: 08/13/2020 12:16   DG Hip Unilat With Pelvis 2-3 Views  Right  Result Date: 08/13/2020 CLINICAL DATA:  Right hip pain after mechanical fall 2 days ago EXAM: DG HIP (WITH OR WITHOUT PELVIS) 2-3V RIGHT COMPARISON:  04/19/2019 FINDINGS: There is no evidence of hip fracture or dislocation. Partially visualized right femoral ORIF hardware. There is no evidence of arthropathy or other focal bone abnormality. IMPRESSION: Negative. Electronically Signed   By: Davina Poke D.O.   On: 08/13/2020 12:14    Procedures Procedures (including critical care time)  Medications Ordered in ED Medications  0.9 %  sodium chloride infusion (has no administration in time range)  fentaNYL (SUBLIMAZE) injection 50 mcg (0 mcg Intravenous Hold 08/13/20 1336)  ondansetron (ZOFRAN) injection 4 mg (4 mg Intravenous Given 08/13/20 1313)    ED Course  I have reviewed the triage vital signs and the nursing notes.  Pertinent labs & imaging results that were available during my care of the patient were reviewed by me and considered in my medical decision making (see chart for details).  Clinical Course as of Aug 13 1699  Wed Aug 13, 2020  1632 Labs reviewed.  No significant abnormalities.  Potassium slightly elevated but I doubt clinically significant Covid test is negative   [JK]  1633 CT scan of the pelvis without acute findings   [JK]    Clinical Course User Index [JK] Dorie Rank, MD   MDM Rules/Calculators/A&P  Patient presented to ED for evaluation after a fall.  X-rays did not show any signs of acute fracture.  CT scan was also performed to evaluate for occult right hip fracture and pelvic fracture.  CT scan does not show any acute findings.  Doubt occult hip fracture at this time no need for emergent MRI.  Pain medications ordered with the patient did not want them.  Patient does have a wheelchair at home.  I will order home health to assist her with her daily activities as she does live at home by herself.  Family members are also coming  to help her.  Plan on discharge home with pain medications.  Discussed close follow-up with her orthopedic doctor.   Final Clinical Impression(s) / ED Diagnoses Final diagnoses:  Fall, initial encounter  Contusion of right hip, initial encounter    Rx / DC Orders ED Discharge Orders         [place]        08/13/20 1649    Face-to-face encounter (required for Medicare/Medicaid patients)       Comments: I Dorie Rank certify that this patient is under my care and that I, or a nurse practitioner or physician's assistant working with me, had a face-to-face encounter that meets the physician face-to-face encounter requirements with this patient on 08/13/2020. The encounter with the patient was in whole, or in part for the following medical condition(s) which is the primary reason for home health care (List medical condition): hip contusion   08/13/20 1649    HYDROcodone-acetaminophen (NORCO/VICODIN) 5-325 MG tablet  Every 6 hours PRN        08/13/20 1700           Dorie Rank, MD 08/13/20 1700

## 2020-08-13 NOTE — Discharge Instructions (Addendum)
The hydrocodone can be taken for more severe pain (it may cause headaches as listed in your allergies)  Otherwise OK to take over the counter pain medications.  Follow up with your orthopedic doctor for further evaluation.

## 2020-08-14 DIAGNOSIS — M47817 Spondylosis without myelopathy or radiculopathy, lumbosacral region: Secondary | ICD-10-CM | POA: Diagnosis not present

## 2020-08-14 DIAGNOSIS — F32A Depression, unspecified: Secondary | ICD-10-CM | POA: Diagnosis not present

## 2020-08-14 DIAGNOSIS — H3530 Unspecified macular degeneration: Secondary | ICD-10-CM | POA: Diagnosis not present

## 2020-08-14 DIAGNOSIS — E785 Hyperlipidemia, unspecified: Secondary | ICD-10-CM | POA: Diagnosis not present

## 2020-08-14 DIAGNOSIS — N200 Calculus of kidney: Secondary | ICD-10-CM | POA: Diagnosis not present

## 2020-08-14 DIAGNOSIS — I110 Hypertensive heart disease with heart failure: Secondary | ICD-10-CM | POA: Diagnosis not present

## 2020-08-14 DIAGNOSIS — D649 Anemia, unspecified: Secondary | ICD-10-CM | POA: Diagnosis not present

## 2020-08-14 DIAGNOSIS — M76891 Other specified enthesopathies of right lower limb, excluding foot: Secondary | ICD-10-CM | POA: Diagnosis not present

## 2020-08-14 DIAGNOSIS — I509 Heart failure, unspecified: Secondary | ICD-10-CM | POA: Diagnosis not present

## 2020-08-14 DIAGNOSIS — M7500 Adhesive capsulitis of unspecified shoulder: Secondary | ICD-10-CM | POA: Diagnosis not present

## 2020-08-14 DIAGNOSIS — M778 Other enthesopathies, not elsewhere classified: Secondary | ICD-10-CM | POA: Diagnosis not present

## 2020-08-14 DIAGNOSIS — Z6841 Body Mass Index (BMI) 40.0 and over, adult: Secondary | ICD-10-CM | POA: Diagnosis not present

## 2020-08-14 DIAGNOSIS — I700 Atherosclerosis of aorta: Secondary | ICD-10-CM | POA: Diagnosis not present

## 2020-08-14 DIAGNOSIS — Z8673 Personal history of transient ischemic attack (TIA), and cerebral infarction without residual deficits: Secondary | ICD-10-CM | POA: Diagnosis not present

## 2020-08-14 DIAGNOSIS — H409 Unspecified glaucoma: Secondary | ICD-10-CM | POA: Diagnosis not present

## 2020-08-14 DIAGNOSIS — S7001XD Contusion of right hip, subsequent encounter: Secondary | ICD-10-CM | POA: Diagnosis not present

## 2020-08-14 DIAGNOSIS — F419 Anxiety disorder, unspecified: Secondary | ICD-10-CM | POA: Diagnosis not present

## 2020-08-14 DIAGNOSIS — G4730 Sleep apnea, unspecified: Secondary | ICD-10-CM | POA: Diagnosis not present

## 2020-08-14 DIAGNOSIS — E7800 Pure hypercholesterolemia, unspecified: Secondary | ICD-10-CM | POA: Diagnosis not present

## 2020-08-14 DIAGNOSIS — E1140 Type 2 diabetes mellitus with diabetic neuropathy, unspecified: Secondary | ICD-10-CM | POA: Diagnosis not present

## 2020-08-14 DIAGNOSIS — E1136 Type 2 diabetes mellitus with diabetic cataract: Secondary | ICD-10-CM | POA: Diagnosis not present

## 2020-08-14 DIAGNOSIS — G43909 Migraine, unspecified, not intractable, without status migrainosus: Secondary | ICD-10-CM | POA: Diagnosis not present

## 2020-08-14 DIAGNOSIS — M47816 Spondylosis without myelopathy or radiculopathy, lumbar region: Secondary | ICD-10-CM | POA: Diagnosis not present

## 2020-08-14 DIAGNOSIS — M76892 Other specified enthesopathies of left lower limb, excluding foot: Secondary | ICD-10-CM | POA: Diagnosis not present

## 2020-08-14 DIAGNOSIS — I10 Essential (primary) hypertension: Secondary | ICD-10-CM | POA: Diagnosis not present

## 2020-08-15 ENCOUNTER — Emergency Department (HOSPITAL_COMMUNITY)
Admission: EM | Admit: 2020-08-15 | Discharge: 2020-08-17 | Disposition: A | Discharge status: Home / Self Care | Payer: Medicare Other | Attending: Emergency Medicine | Admitting: Emergency Medicine

## 2020-08-15 ENCOUNTER — Encounter (HOSPITAL_COMMUNITY): Payer: Self-pay

## 2020-08-15 ENCOUNTER — Other Ambulatory Visit: Payer: Self-pay

## 2020-08-15 DIAGNOSIS — I110 Hypertensive heart disease with heart failure: Secondary | ICD-10-CM | POA: Diagnosis not present

## 2020-08-15 DIAGNOSIS — W19XXXD Unspecified fall, subsequent encounter: Secondary | ICD-10-CM

## 2020-08-15 DIAGNOSIS — E1140 Type 2 diabetes mellitus with diabetic neuropathy, unspecified: Secondary | ICD-10-CM | POA: Insufficient documentation

## 2020-08-15 DIAGNOSIS — E782 Mixed hyperlipidemia: Secondary | ICD-10-CM | POA: Diagnosis not present

## 2020-08-15 DIAGNOSIS — M79604 Pain in right leg: Secondary | ICD-10-CM | POA: Insufficient documentation

## 2020-08-15 DIAGNOSIS — M47817 Spondylosis without myelopathy or radiculopathy, lumbosacral region: Secondary | ICD-10-CM | POA: Diagnosis not present

## 2020-08-15 DIAGNOSIS — Z794 Long term (current) use of insulin: Secondary | ICD-10-CM | POA: Insufficient documentation

## 2020-08-15 DIAGNOSIS — Z9181 History of falling: Secondary | ICD-10-CM | POA: Diagnosis not present

## 2020-08-15 DIAGNOSIS — W1830XD Fall on same level, unspecified, subsequent encounter: Secondary | ICD-10-CM | POA: Diagnosis not present

## 2020-08-15 DIAGNOSIS — I509 Heart failure, unspecified: Secondary | ICD-10-CM | POA: Diagnosis not present

## 2020-08-15 DIAGNOSIS — Z20822 Contact with and (suspected) exposure to covid-19: Secondary | ICD-10-CM | POA: Diagnosis not present

## 2020-08-15 DIAGNOSIS — I10 Essential (primary) hypertension: Secondary | ICD-10-CM | POA: Diagnosis not present

## 2020-08-15 DIAGNOSIS — S7001XD Contusion of right hip, subsequent encounter: Secondary | ICD-10-CM | POA: Diagnosis not present

## 2020-08-15 DIAGNOSIS — Z7984 Long term (current) use of oral hypoglycemic drugs: Secondary | ICD-10-CM | POA: Diagnosis not present

## 2020-08-15 DIAGNOSIS — M76891 Other specified enthesopathies of right lower limb, excluding foot: Secondary | ICD-10-CM | POA: Diagnosis not present

## 2020-08-15 DIAGNOSIS — M79606 Pain in leg, unspecified: Secondary | ICD-10-CM | POA: Diagnosis not present

## 2020-08-15 DIAGNOSIS — Z79899 Other long term (current) drug therapy: Secondary | ICD-10-CM | POA: Insufficient documentation

## 2020-08-15 DIAGNOSIS — M47816 Spondylosis without myelopathy or radiculopathy, lumbar region: Secondary | ICD-10-CM | POA: Insufficient documentation

## 2020-08-15 DIAGNOSIS — Z96651 Presence of right artificial knee joint: Secondary | ICD-10-CM | POA: Insufficient documentation

## 2020-08-15 DIAGNOSIS — G629 Polyneuropathy, unspecified: Secondary | ICD-10-CM | POA: Diagnosis not present

## 2020-08-15 DIAGNOSIS — R6889 Other general symptoms and signs: Secondary | ICD-10-CM | POA: Diagnosis not present

## 2020-08-15 DIAGNOSIS — M778 Other enthesopathies, not elsewhere classified: Secondary | ICD-10-CM | POA: Diagnosis not present

## 2020-08-15 DIAGNOSIS — M76892 Other specified enthesopathies of left lower limb, excluding foot: Secondary | ICD-10-CM | POA: Diagnosis not present

## 2020-08-15 LAB — COMPREHENSIVE METABOLIC PANEL
ALT: 16 U/L (ref 0–44)
AST: 18 U/L (ref 15–41)
Albumin: 3.7 g/dL (ref 3.5–5.0)
Alkaline Phosphatase: 51 U/L (ref 38–126)
Anion gap: 12 (ref 5–15)
BUN: 26 mg/dL — ABNORMAL HIGH (ref 8–23)
CO2: 22 mmol/L (ref 22–32)
Calcium: 9.4 mg/dL (ref 8.9–10.3)
Chloride: 107 mmol/L (ref 98–111)
Creatinine, Ser: 0.77 mg/dL (ref 0.44–1.00)
GFR, Estimated: >60 mL/min (ref >60)
Glucose, Bld: 188 mg/dL — ABNORMAL HIGH (ref 70–99)
Potassium: 4 mmol/L (ref 3.5–5.1)
Sodium: 141 mmol/L (ref 135–145)
Total Bilirubin: 0.6 mg/dL (ref 0.3–1.2)
Total Protein: 6.8 g/dL (ref 6.5–8.1)

## 2020-08-15 LAB — CBC WITH DIFFERENTIAL/PLATELET
Abs Immature Granulocytes: 0.02 10*3/uL (ref 0.00–0.07)
Basophils Absolute: 0.1 10*3/uL (ref 0.0–0.1)
Basophils Relative: 1 %
Eosinophils Absolute: 0.3 10*3/uL (ref 0.0–0.5)
Eosinophils Relative: 3 %
HCT: 45.3 % (ref 36.0–46.0)
Hemoglobin: 13.8 g/dL (ref 12.0–15.0)
Immature Granulocytes: 0 %
Lymphocytes Relative: 21 %
Lymphs Abs: 2.1 10*3/uL (ref 0.7–4.0)
MCH: 28.9 pg (ref 26.0–34.0)
MCHC: 30.5 g/dL (ref 30.0–36.0)
MCV: 94.8 fL (ref 80.0–100.0)
Monocytes Absolute: 0.8 10*3/uL (ref 0.1–1.0)
Monocytes Relative: 8 %
Neutro Abs: 6.6 10*3/uL (ref 1.7–7.7)
Neutrophils Relative %: 67 %
Platelets: 204 10*3/uL (ref 150–400)
RBC: 4.78 MIL/uL (ref 3.87–5.11)
RDW: 13.6 % (ref 11.5–15.5)
WBC: 9.9 10*3/uL (ref 4.0–10.5)
nRBC: 0 % (ref 0.0–0.2)

## 2020-08-15 LAB — RESPIRATORY PANEL BY RT PCR (FLU A&B, COVID)
Influenza A by PCR: NEGATIVE
Influenza B by PCR: NEGATIVE
SARS Coronavirus 2 by RT PCR: NEGATIVE

## 2020-08-15 LAB — CBG MONITORING, ED: Glucose-Capillary: 158 mg/dL — ABNORMAL HIGH (ref 70–99)

## 2020-08-15 MED ORDER — SPIRONOLACTONE 25 MG PO TABS
25.0000 mg | ORAL_TABLET | Freq: Every day | ORAL | Status: DC
  Administered 2020-08-16 – 2020-08-17 (×2): 25 mg via ORAL
  Filled 2020-08-15 (×2): qty 1

## 2020-08-15 MED ORDER — METFORMIN HCL ER 500 MG PO TB24
500.0000 mg | ORAL_TABLET | Freq: Every day | ORAL | Status: DC
  Administered 2020-08-15 – 2020-08-16 (×2): 500 mg via ORAL
  Filled 2020-08-15 (×2): qty 1

## 2020-08-15 MED ORDER — LOSARTAN POTASSIUM 50 MG PO TABS
100.0000 mg | ORAL_TABLET | Freq: Every day | ORAL | Status: DC
  Administered 2020-08-15 – 2020-08-17 (×3): 100 mg via ORAL
  Filled 2020-08-15 (×2): qty 4
  Filled 2020-08-15: qty 2

## 2020-08-15 MED ORDER — METOPROLOL TARTRATE 25 MG PO TABS
100.0000 mg | ORAL_TABLET | Freq: Two times a day (BID) | ORAL | Status: DC
  Administered 2020-08-15 – 2020-08-17 (×4): 100 mg via ORAL
  Filled 2020-08-15 (×4): qty 4

## 2020-08-15 MED ORDER — CANAGLIFLOZIN 100 MG PO TABS
300.0000 mg | ORAL_TABLET | Freq: Every day | ORAL | Status: DC
  Administered 2020-08-16 – 2020-08-17 (×2): 300 mg via ORAL
  Filled 2020-08-15 (×2): qty 3

## 2020-08-15 MED ORDER — HYDROCODONE-ACETAMINOPHEN 5-325 MG PO TABS
1.0000 | ORAL_TABLET | Freq: Four times a day (QID) | ORAL | Status: DC | PRN
  Administered 2020-08-16 – 2020-08-17 (×2): 1 via ORAL
  Filled 2020-08-15 (×2): qty 1

## 2020-08-15 MED ORDER — DOCUSATE CALCIUM 240 MG PO CAPS: 240.0000 mg | ORAL_CAPSULE | Freq: Every day | ORAL | Status: DC

## 2020-08-15 MED ORDER — FUROSEMIDE 20 MG PO TABS
20.0000 mg | ORAL_TABLET | Freq: Every day | ORAL | Status: DC
  Administered 2020-08-15 – 2020-08-17 (×3): 20 mg via ORAL
  Filled 2020-08-15 (×3): qty 1

## 2020-08-15 MED ORDER — SIMVASTATIN 20 MG PO TABS
40.0000 mg | ORAL_TABLET | Freq: Every evening | ORAL | Status: DC
  Administered 2020-08-15 – 2020-08-16 (×2): 40 mg via ORAL
  Filled 2020-08-15: qty 2
  Filled 2020-08-15: qty 4

## 2020-08-15 NOTE — ED Provider Notes (Signed)
[name] DEPT Provider Note   CSN: [id number] Arrival date & time: 08/15/20  1829     History Chief Complaint  Patient presents with  . Physical Evaluation    Jessica [name] is a 74 y.o. female.  74 year old female who presents for placement to a facility.  Seen here earlier this week after a fall.  That work-up was reviewed which included a CT of the hip and there was no occult fracture seen.  States that she lives alone and normally uses a walker with wheels.  Cannot do this at this time.  Has no other help at home.  Saw her physician today, Dr. Leonides Schanz, who spoke with me and recommends patient be placed in rehab or SNF.  Patient no other complaints at this time.        Past Medical History:  Diagnosis Date  . Anemia   . Anxiety   . Arthritis   . Cataract   . Depression   . Diabetes mellitus without complication ([place])   . Diastolic dysfunction   . Frozen shoulder   . Glaucoma   . Headache    History of migraines  . Heart murmur   . History of kidney stones   . Hyperlipidemia   . Hypertension   . Macular degeneration   . Neuropathy   . Obesity   . Sleep apnea   . Stroke [facility])    has been told by a Dr. Mort Sawyers that she may have had a mini stroke but no further testing was done to confirm  . Tachycardia     Patient Active Problem List   Diagnosis Date Noted  . Hyperlipidemia   . Diarrhea   . Scar 06/19/2019  . Vaginal yeast infection 05/26/2019  . At risk for adverse drug event 05/02/2019  . CHF (congestive heart failure) ([place]) 05/01/2019  . Pressure ulcer of sacral region, stage 2 ([place]) 05/01/2019  . Displaced supracondylar fracture of distal end of right femur without intracondylar extension ([place]) 04/19/2019  . Closed right femoral fracture ([place]) 04/17/2019  . Sleep apnea   . Glaucoma   . Essential hypertension, benign 09/03/2014  . Morbid obesity ([place]) 03/06/2014  . HTN (hypertension) 03/06/2014  . Vertigo  03/06/2014  . Type 2 diabetes mellitus ([place]) 03/06/2014  . Pure hypercholesterolemia 03/06/2014    Past Surgical History:  Procedure Laterality Date  . CARDIAC CATHETERIZATION     -6/09-normal ejection fraction 65%, no angiographically significant CAD., M. Skains MD  . CESAREAN SECTION    . COLONOSCOPY    . foot spurs removed    . HYSTEROSCOPY WITH D & C N/A 06/28/2017   Procedure: DILATATION AND CURETTAGE /HYSTEROSCOPY/POLYPECTOMY WITH MYOSURE;  Surgeon: Christophe Louis, MD;  Location: [facility];  Service: Gynecology;  Laterality: N/A;  . JOINT REPLACEMENT     right knee replaced  . knee spurs removed    . ORIF FEMUR FRACTURE Right 04/19/2019   Procedure: OPEN REDUCTION INTERNAL FIXATION (ORIF) DISTAL FEMUR FRACTURE;  Surgeon: Rod Can, MD;  Location: [facility];  Service: Orthopedics;  Laterality: Right;  . shoulder spurs removed    . TONSILLECTOMY AND ADENOIDECTOMY    . UPPER GI ENDOSCOPY       OB History   No obstetric history on file.     Family History  Problem Relation Age of Onset  . Colon polyps Father   . Brain cancer Mother     Social History   Tobacco Use  .  Smoking status: Never Smoker  . Smokeless tobacco: Never Used  Vaping Use  . Vaping Use: Never used  Substance Use Topics  . Alcohol use: No    Comment: maybe once a year  . Drug use: No    Home Medications Prior to Admission medications   Medication Sig Start Date End Date Taking? Authorizing Provider  acetaminophen (TYLENOL) 325 MG tablet Take 2 tablets (650 mg total) by mouth every 6 (six) hours as needed for mild pain. Do not take more than 4073m of tylenol per day 07/26/19   Medina-Vargas, Monina C, NP  B-D INS SYRINGE 0.5CC/31GX5/16 31G X 5/16" 0.5 ML MISC Use as directed per provider. 06/11/13   [provider]  Cholecalciferol (VITAMIN D3) 50 MCG (2000 UT) capsule Take 2,000 Units by mouth daily.    [provider]  Coenzyme Q10 (CO Q-10) 100 MG CAPS Take 100 mg by mouth at  bedtime.     [provider]  docusate calcium (SURFAK) 240 MG capsule Take 240 mg by mouth daily.    [provider]  furosemide (LASIX) 20 MG tablet TAKE 1 TABLET DAILY Patient taking differently: Take 20 mg by mouth daily.  10/15/19   SJerline Pain MD  HYDROcodone-acetaminophen (NORCO/VICODIN) 5-325 MG tablet Take 1 tablet by mouth every 6 (six) hours as needed. 08/13/20   KDorie Rank MD  insulin lispro (HUMALOG) 100 UNIT/ML injection Insulin pump 07/26/19   Medina-Vargas, Monina C, NP  INVOKANA 300 MG TABS tablet Take 1 tablet (300 mg total) by mouth daily. 07/26/19   Medina-Vargas, Monina C, NP  Iron-Vitamin C (VITRON-C) 65-125 MG TABS Take 1 tablet by mouth at bedtime.     [provider]  losartan (COZAAR) 100 MG tablet TAKE 1 TABLET DAILY Patient taking differently: Take 100 mg by mouth daily.  01/18/20   SJerline Pain MD  metFORMIN (GLUCOPHAGE-XR) 500 MG 24 hr tablet Take 1 tablet (500 mg total) by mouth at bedtime. 07/26/19   Medina-Vargas, Monina C, NP  metoprolol tartrate (LOPRESSOR) 100 MG tablet Take 1 tablet (100 mg total) by mouth 2 (two) times daily. 11/06/19   SJerline Pain MD  Multiple Vitamins-Minerals (PRESERVISION AREDS 2 PO) Take 1 tablet by mouth 2 (two) times daily.    [provider]  NON FORMULARY REGULAR, HEART HEALTHY - DIABETIC DIET    [provider]  ONE TOUCH ULTRA TEST test strip 1 each by Other route as needed (Use as directed per provider).  08/05/14   [provider]  simvastatin (ZOCOR) 40 MG tablet Take 1 tablet (40 mg total) by mouth every evening. 07/26/19   Medina-Vargas, Monina C, NP  spironolactone (ALDACTONE) 25 MG tablet TAKE 1 TABLET DAILY Patient taking differently: Take 25 mg by mouth daily.  10/15/19   SJerline Pain MD  Travoprost, BAK Free, (TRAVATAN) 0.004 % SOLN ophthalmic solution Place 1 drop into both eyes at bedtime.     [provider]    Allergies    Meloxicam, Percocet  [oxycodone-acetaminophen], Tramadol, Vicodin [hydrocodone-acetaminophen], Azithromycin, and Iodine  Review of Systems   Review of Systems  All other systems reviewed and are negative.   Physical Exam Updated Vital Signs BP (!) 147/64 (BP Location: Left Wrist)   Pulse 88   Temp 99.1 F (37.3 C) (Oral)   LMP  (LMP Unknown)   SpO2 94%   Physical Exam Vitals and nursing note reviewed.  Constitutional:      General: She is  not in acute distress.    Appearance: Normal appearance. She is well-developed. She is not toxic-appearing.  HENT:     Head: Normocephalic and atraumatic.  Eyes:     General: Lids are normal.     Conjunctiva/sclera: Conjunctivae normal.     Pupils: Pupils are equal, round, and reactive to light.  Neck:     Thyroid: No thyroid mass.     Trachea: No tracheal deviation.  Cardiovascular:     Rate and Rhythm: Normal rate and regular rhythm.     Heart sounds: Normal heart sounds. No murmur heard.  No gallop.   Pulmonary:     Effort: Pulmonary effort is normal. No respiratory distress.     Breath sounds: Normal breath sounds. No stridor. No decreased breath sounds, wheezing, rhonchi or rales.  Abdominal:     General: Bowel sounds are normal. There is no distension.     Palpations: Abdomen is soft.     Tenderness: There is no abdominal tenderness. There is no rebound.  Musculoskeletal:        General: No tenderness. Normal range of motion.     Cervical back: Normal range of motion and neck supple.       Legs:     Comments: No shortening or rotation.  Neurovascular intact at right foot  Skin:    General: Skin is warm and dry.     Findings: No abrasion or rash.  Neurological:     Mental Status: She is alert and oriented to person, place, and time.     GCS: GCS eye subscore is 4. GCS verbal subscore is 5. GCS motor subscore is 6.     Cranial Nerves: No cranial nerve deficit.     Sensory: No sensory deficit.  Psychiatric:        Speech: Speech normal.         Behavior: Behavior normal.     ED Results / Procedures / Treatments   Labs (all labs ordered are listed, but only abnormal results are displayed) Labs Reviewed  URINE CULTURE  CBC WITH DIFFERENTIAL/PLATELET  COMPREHENSIVE METABOLIC PANEL  URINALYSIS, ROUTINE W REFLEX MICROSCOPIC    EKG None  Radiology No results found.  Procedures Procedures (including critical care time)  Medications Ordered in ED Medications - No data to display  ED Course  I have reviewed the triage vital signs and the nursing notes.  Pertinent labs & imaging results that were available during my care of the patient were reviewed by me and considered in my medical decision making (see chart for details).    MDM Rules/Calculators/A&P                          Patient CBC normal.  Mild hyperglycemia noted on patient c-Met.  Discussed with social work and they will work on placement tomorrow Final Clinical Impression(s) / ED Diagnoses Final diagnoses:  None    Rx / DC Orders ED Discharge Orders    None       Lacretia Leigh, MD 08/15/20 2050

## 2020-08-15 NOTE — Progress Notes (Addendum)
Assessment completed.  FL-2 completed, signed, sent out with referrals via the hub at the request of and with the verbal permission of the the pt.  Pt voiced understanding while an attempt at the maximum amount of choices of SNF's will be sought that if necessary due to time constraints in the ED, the pt may have to go to the 1st available SNF found.  Pt voiced understanding X2..  EDP/RN updated.  CSW will continue to follow for D/C needs.  Alphonse Guild. Yexalen Deike  MSW, LCSW, LCAS, CCS Transitions of Care Clinical Social Worker Care Coordination Department Ph: [phone]

## 2020-08-15 NOTE — Progress Notes (Signed)
CSW was updated by the EDP about pt having multiple visits to the ED and an attempt for placement for safety would be appropriate if this were a possible outcome.  CSW confirmed placement with pt's insurance and the current ability to seek placement without the need for a 3-day inpatient stay due to the pt being a [facility] pt, which the CSW was able to confirm is possible with a TOC peer.  CSW spoke with pt and confirmed pt's plan to be discharged to SNF to for rehab at discharge.  CSW provided active listening and validated pt's concerns she prefers to have choices of SNF's if possible, but voiced understanding that this is not laways possible from the ED.   CSW was given permission to complete FL-2 and send referrals out to SNF facilities via the hub, per pt's request.  Pt has been living independently prior to returning to being admitted to [facility] ED.  Pt was previously at [facility] ED two days ago.  2nd shift ED CSW will leave handoff for 1st shift TOC RN CM/TOC CSW who is aware of pt's return.  CSW will continue to follow for D/C needs.  Alphonse Guild. Kathleen Likins  MSW, LCSW, LCAS, CCS Transitions of Care Clinical Social Worker Care Coordination Department Ph: [phone]

## 2020-08-15 NOTE — ED Triage Notes (Addendum)
Pt BIB EMS from home, pt lives alone. Pt states her PCP told her to come to the hospital and be placed in a SNF. Pt here in [facility] ED 10/20 for a fall. Pt A&Ox4. Pt states she is nonambulatory.

## 2020-08-15 NOTE — TOC Initial Note (Signed)
Transition of Care [facility]) - Initial/Assessment Note    Patient Details  Name: Jessica [name] MRN: [id number] Date of Birth: 1946/03/26  [facility] CM/SW Contact:    Claudine Mouton, LCSW Phone Number: 08/15/2020, 9:50 PM  Clinical Narrative:    CSW spoke with pt and confirmed pt's plan to be discharged to SNF to for rehab at discharge.  CSW provided active listening and validated pt's concerns she prefers to have choices of SNF's if possible, but voiced understanding that this is not laways possible from the ED.   CSW was given permission to complete FL-2 and send referrals out to SNF facilities via the hub, per pt's request. Pt has been living independently prior to returning to being admitted to [facility] ED.  Pt was previously at [facility] ED two days ago               Expected Discharge Plan: [place] Barriers to Discharge: Ship broker (3-day waiver via [facility])   Patient Goals and CMS Choice        Expected Discharge Plan and Services Expected Discharge Plan: [place]                                              Prior Living Arrangements/Services   Lives with:: Self          Need for Family Participation in Patient Care: No (Comment) Care giver support system in place?: No (comment)      Activities of Daily Living      Permission Sought/Granted Permission sought to share information with : Chartered certified accountant granted to share information with : Yes, Verbal Permission Granted              Emotional Assessment     Affect (typically observed): Calm, Adaptable, Accepting, Pleasant Orientation: : Oriented to Self, Oriented to Place, Oriented to  Time, Oriented to Situation      Admission diagnosis:  weakness Patient Active Problem List   Diagnosis Date Noted  . Hyperlipidemia   . Diarrhea   . Scar 06/19/2019  . Vaginal yeast infection 05/26/2019  . At risk for adverse  drug event 05/02/2019  . CHF (congestive heart failure) ([place]) 05/01/2019  . Pressure ulcer of sacral region, stage 2 ([place]) 05/01/2019  . Displaced supracondylar fracture of distal end of right femur without intracondylar extension ([place]) 04/19/2019  . Closed right femoral fracture ([place]) 04/17/2019  . Sleep apnea   . Glaucoma   . Essential hypertension, benign 09/03/2014  . Morbid obesity ([place]) 03/06/2014  . HTN (hypertension) 03/06/2014  . Vertigo 03/06/2014  . Type 2 diabetes mellitus ([place]) 03/06/2014  . Pure hypercholesterolemia 03/06/2014   PCP:  Cari Caraway, MD Pharmacy:   [facility] [phone] [place], [place] [address] [place] [address] Phone: [phone] Fax: [phone]     Social Determinants of Health (SDOH) Interventions    Readmission Risk Interventions No flowsheet data found.   Please reconsult if future social work needs arise.  CSW signing off, as social work intervention is no longer needed.  Alphonse Guild. Felix Meras  MSW, LCSW, LCAS, CCS Transitions of Care Clinical Social Worker Care Coordination Department Ph: [phone]

## 2020-08-15 NOTE — NC FL2 (Signed)
[place] LEVEL OF CARE SCREENING TOOL     IDENTIFICATION  Patient Name: Jessica [name] Birthdate: Mar 31, 1946 Sex: female Admission Date (Current Location): 08/15/2020  [facility] and [place] Number:  Herbalist and Address:  [facility],  [place], [place]      Provider Number: [id number]  Attending Physician Name and Address:  Lacretia Leigh, MD  Relative Name and Phone Number:       Current Level of Care: Hospital Recommended Level of Care: [place] Prior Approval Number:    Date Approved/Denied: 04/17/08 PASRR Number: [id number]  Discharge Plan: SNF    Current Diagnoses: Patient Active Problem List   Diagnosis Date Noted  . Hyperlipidemia   . Diarrhea   . Scar 06/19/2019  . Vaginal yeast infection 05/26/2019  . At risk for adverse drug event 05/02/2019  . CHF (congestive heart failure) ([place]) 05/01/2019  . Pressure ulcer of sacral region, stage 2 ([place]) 05/01/2019  . Displaced supracondylar fracture of distal end of right femur without intracondylar extension ([place]) 04/19/2019  . Closed right femoral fracture ([place]) 04/17/2019  . Sleep apnea   . Glaucoma   . Essential hypertension, benign 09/03/2014  . Morbid obesity ([place]) 03/06/2014  . HTN (hypertension) 03/06/2014  . Vertigo 03/06/2014  . Type 2 diabetes mellitus ([place]) 03/06/2014  . Pure hypercholesterolemia 03/06/2014    Orientation RESPIRATION BLADDER Height & Weight     Self, Time, Situation, Place  Normal Incontinent Weight:   Height:     BEHAVIORAL SYMPTOMS/MOOD NEUROLOGICAL BOWEL NUTRITION STATUS      Incontinent Diet (Carb-modified, no salt, heart-healthy diet)  AMBULATORY STATUS COMMUNICATION OF NEEDS Skin   Limited Assist Verbally Normal                       Personal Care Assistance Level of Assistance  Bathing, Dressing Bathing Assistance: Limited assistance   Dressing Assistance: Limited assistance      Functional Limitations Info             SPECIAL CARE FACTORS FREQUENCY  PT (By licensed PT), OT (By licensed OT)     PT Frequency: 5 OT Frequency: 5            Contractures Contractures Info: Present    Additional Factors Info  Code Status, Allergies Code Status Info: FULL CODE Allergies Info: Meloxicam, Percocet (Oxycodone-acetaminophen) Tramadol, Vicodin (Hydrocodone, acetaminophen), Azithromycin, Iodine, Shellfish Allergy           Current Medications (08/15/2020):  This is the current hospital active medication list Current Facility-Administered Medications  Medication Dose Route Frequency Provider Last Rate Last Admin  . [START ON 08/16/2020] canagliflozin (INVOKANA) tablet 300 mg  300 mg Oral Daily Lacretia Leigh, MD      . furosemide (LASIX) tablet 20 mg  20 mg Oral Daily Lacretia Leigh, MD   20 mg at 08/15/20 2047  . HYDROcodone-acetaminophen (NORCO/VICODIN) 5-325 MG per tablet 1 tablet  1 tablet Oral Q6H PRN Lacretia Leigh, MD      . losartan (COZAAR) tablet 100 mg  100 mg Oral Daily Lacretia Leigh, MD   100 mg at 08/15/20 2048  . metFORMIN (GLUCOPHAGE-XR) 24 hr tablet 500 mg  500 mg Oral QHS Lacretia Leigh, MD      . metoprolol tartrate (LOPRESSOR) tablet 100 mg  100 mg Oral BID Lacretia Leigh, MD   100 mg at 08/15/20 2047  . simvastatin (ZOCOR) tablet 40 mg  40  mg Oral QPM Lacretia Leigh, MD   40 mg at 08/15/20 2047  . [START ON 08/16/2020] spironolactone (ALDACTONE) tablet 25 mg  25 mg Oral Daily Lacretia Leigh, MD       Current Outpatient Medications  Medication Sig Dispense Refill  . acetaminophen (TYLENOL) 650 MG CR tablet Take 650 mg by mouth every 8 (eight) hours as needed for pain.    . Cholecalciferol (VITAMIN D3) 50 MCG (2000 UT) capsule Take 2,000 Units by mouth daily.    . Coenzyme Q10 (CO Q-10) 100 MG CAPS Take 100 mg by mouth at bedtime.     . dorzolamidel-timolol (COSOPT) 22.3-6.8 MG/ML SOLN ophthalmic solution Place 1 drop into both eyes 2  (two) times daily.    . furosemide (LASIX) 20 MG tablet TAKE 1 TABLET DAILY (Patient taking differently: Take 20 mg by mouth daily. ) 90 tablet 3  . HYDROcodone-acetaminophen (NORCO/VICODIN) 5-325 MG tablet Take 1 tablet by mouth every 6 (six) hours as needed. (Patient taking differently: Take 1 tablet by mouth every 6 (six) hours as needed for moderate pain. ) 12 tablet 0  . insulin lispro (HUMALOG) 100 UNIT/ML injection Insulin pump (Patient taking differently: Inject 3 Units into the skin continuous. Insulin pump. Maximum daily dose - 200 units) 10 mL 0  . INVOKANA 300 MG TABS tablet Take 1 tablet (300 mg total) by mouth daily. 30 tablet 0  . Iron-Vitamin C (VITRON-C) 65-125 MG TABS Take 1 tablet by mouth at bedtime.     Marland Kitchen losartan (COZAAR) 100 MG tablet TAKE 1 TABLET DAILY (Patient taking differently: Take 100 mg by mouth daily. ) 90 tablet 3  . metFORMIN (GLUCOPHAGE-XR) 500 MG 24 hr tablet Take 1 tablet (500 mg total) by mouth at bedtime. 30 tablet 0  . metoprolol tartrate (LOPRESSOR) 100 MG tablet Take 1 tablet (100 mg total) by mouth 2 (two) times daily. 180 tablet 3  . Multiple Vitamins-Minerals (PRESERVISION AREDS 2 PO) Take 1 tablet by mouth 2 (two) times daily.    Marland Kitchen oxybutynin (DITROPAN-XL) 5 MG 24 hr tablet Take 5 mg by mouth at bedtime.    . simvastatin (ZOCOR) 40 MG tablet Take 1 tablet (40 mg total) by mouth every evening. 30 tablet 0  . spironolactone (ALDACTONE) 25 MG tablet TAKE 1 TABLET DAILY (Patient taking differently: Take 25 mg by mouth daily. ) 90 tablet 3  . Travoprost, BAK Free, (TRAVATAN) 0.004 % SOLN ophthalmic solution Place 1 drop into both eyes at bedtime.     Marland Kitchen acetaminophen (TYLENOL) 325 MG tablet Take 2 tablets (650 mg total) by mouth every 6 (six) hours as needed for mild pain. Do not take more than 4000mg  of tylenol per day (Patient not taking: Reported on 08/15/2020)    . B-D INS SYRINGE 0.5CC/31GX5/16 31G X 5/16" 0.5 ML MISC Use as directed per provider.    . ONE  TOUCH ULTRA TEST test strip 1 each by Other route as needed (Use as directed per provider).   11     Discharge Medications: Please see discharge summary for a list of discharge medications.  Relevant Imaging Results:  Relevant Lab Results:   Additional Information [id number]  Alphonse Guild Javion Holmer, LCSW

## 2020-08-15 NOTE — Progress Notes (Signed)
Per [place] MUST pt has a PASRR:   [id number]  CSW will continue to follow for D/C needs.  Alphonse Guild. Avagrace Botelho  MSW, LCSW, LCAS, CCS Transitions of Care Clinical Social Worker Care Coordination Department Ph: [phone]

## 2020-08-16 DIAGNOSIS — M79604 Pain in right leg: Secondary | ICD-10-CM | POA: Diagnosis not present

## 2020-08-16 LAB — CBG MONITORING, ED
Glucose-Capillary: 134 mg/dL — ABNORMAL HIGH (ref 70–99)
Glucose-Capillary: 170 mg/dL — ABNORMAL HIGH (ref 70–99)

## 2020-08-16 LAB — URINALYSIS, ROUTINE W REFLEX MICROSCOPIC
Bilirubin Urine: NEGATIVE
Glucose, UA: >=500 mg/dL — AB
Ketones, ur: NEGATIVE mg/dL
Nitrite: NEGATIVE
Protein, ur: NEGATIVE mg/dL
Specific Gravity, Urine: 1.015 (ref 1.005–1.030)
WBC, UA: >50 WBC/hpf — ABNORMAL HIGH (ref 0–5)
pH: 5 (ref 5.0–8.0)

## 2020-08-16 NOTE — ED Notes (Addendum)
Entered in error

## 2020-08-16 NOTE — ED Notes (Signed)
Patient placed herself on her CPAP

## 2020-08-16 NOTE — Progress Notes (Addendum)
3pm: CSW spoke with patient to present her with bed offers - patient selected [facility] for SNF placement. Patient states she has received both doses of the [place] vaccine and she has her card with her.  CSW returned call to Abigail Butts to inform her of acceptance of the bed offer - patient can transfer to facility on Sunday 10/24.  10am: CSW spoke with Claiborne Billings at [facility] who states there are no female beds available until Monday.  CSW spoke with Loie at [facility] who is agreeable to review this patient's information for possible admission.  CSW spoke with [place] at [facility] who states there are no female beds available until Monday.   CSW spoke with Abigail Butts at [facility] who states the patient has been offered a bed there but there are no beds available today but one will be open tomorrow.  Madilyn Fireman, MSW, LCSW-A Transitions of Care   Clinical Social Worker  [facility] Emergency Departments   Medical ICU [phone]

## 2020-08-16 NOTE — ED Notes (Signed)
Pt continuing to manage blood sugar with pump.

## 2020-08-16 NOTE — Evaluation (Signed)
Physical Therapy Evaluation Patient Details Name: Jessica [name] MRN: [id number] DOB: July 21, 1946 Today's Date: 08/16/2020   History of Present Illness  74 y.o. female admitted with a fall on 08/11/20 and difficulty walking. PMH  includes R femur and L ankle fx 04/19/19 (went to SNF for 3 months), DM, neuropathy, sleep apnea, obesity.  Clinical Impression  Pt admitted with above diagnosis. Attempted supine to sit with total assistance, pt unable to come to full sit 2* 10/10 R thigh pain. SNF recommended. Pt reports she ambulates with a RW and is independent with bathing/dressing at baseline. She does not have assistance available at home as she lives alone. She reports 2 other falls (in addition to the fall this week) in the past year.   Pt currently with functional limitations due to the deficits listed below (see PT Problem List). Pt will benefit from skilled PT to increase their independence and safety with mobility to allow discharge to the venue listed below.       Follow Up Recommendations SNF;Supervision/Assistance - 24 hour    Equipment Recommendations  None recommended by PT    Recommendations for Other Services       Precautions / Restrictions Precautions Precautions: Fall Precaution Comments: recent fall on 08/11/20. Reports 2 other falls this past year. Restrictions Weight Bearing Restrictions: No      Mobility  Bed Mobility Overal bed mobility: Needs Assistance Bed Mobility: Supine to Sit     Supine to sit: Total assist;+2 for physical assistance     General bed mobility comments: attempted supine to sit, pt able to advance LLE to edge of bed, assist to advance RLE partially to edge of bed but limited by pain, attempted to raise trunk wtih total assist but unable to tolerate 2* pain in R thigh    Transfers                 General transfer comment: unable -mechanical lift recommended  Ambulation/Gait             General Gait Details:  unable  Stairs            Wheelchair Mobility    Modified Rankin (Stroke Patients Only)       Balance                                             Pertinent Vitals/Pain Pain Assessment: 0-10 Pain Score: 10-Worst pain ever Pain Location: R thigh with movement, 2-3/10 at rest Pain Descriptors / Indicators: Grimacing;Guarding;Sore Pain Intervention(s): Limited activity within patient's tolerance;Monitored during session;Patient requesting pain meds-RN notified;Repositioned    Home Living Family/patient expects to be discharged to:: Private residence Living Arrangements: Alone   Type of Home: [place]: [place]: [place] - 4 wheels;Shower seat - built in;Wheelchair - power;Transport chair      Prior Function Level of Independence: Independent with assistive device(s)         Comments: walked with rollator, independent ADLs, has walk in tub with built in seat, drives once/month to get nails done, used delivery services for groceries     Hand Dominance        Extremity/Trunk Assessment   Upper Extremity Assessment Upper Extremity Assessment: Overall WFL for tasks assessed    Lower Extremity Assessment Lower Extremity Assessment: RLE deficits/detail;LLE deficits/detail RLE  Deficits / Details: able to flex hip ~35* AAROM limited by pain, does not tolerate hip Abduction AAROM 2* pain, ankle WNL RLE: Unable to fully assess due to pain RLE Sensation: decreased light touch;history of peripheral neuropathy LLE Deficits / Details: grossly WNL at bed level, able to assist with scooting up in bed using LLE LLE Sensation: decreased light touch;history of peripheral neuropathy       Communication   Communication: No difficulties  Cognition Arousal/Alertness: Awake/alert Behavior During Therapy: WFL for tasks assessed/performed Overall Cognitive Status: Within Functional Limits for tasks  assessed                                        General Comments      Exercises     Assessment/Plan    PT Assessment Patient needs continued PT services  PT Problem List Decreased activity tolerance;Decreased mobility;Pain       PT Treatment Interventions DME instruction;Gait training;Functional mobility training;Therapeutic exercise;Patient/family education;Balance training;Therapeutic activities    PT Goals (Current goals can be found in the Care Plan section)  Acute Rehab PT Goals Patient Stated Goal: go to rehab for a few weeks then home PT Goal Formulation: With patient Time For Goal Achievement: 08/30/20 Potential to Achieve Goals: Fair    Frequency Min 2X/week   Barriers to discharge Decreased caregiver support      Co-evaluation               AM-PAC PT "6 Clicks" Mobility  Outcome Measure Help needed turning from your back to your side while in a flat bed without using bedrails?: Total Help needed moving from lying on your back to sitting on the side of a flat bed without using bedrails?: Total Help needed moving to and from a bed to a chair (including a wheelchair)?: Total Help needed standing up from a chair using your arms (e.g., wheelchair or bedside chair)?: Total Help needed to walk in hospital room?: Total Help needed climbing 3-5 steps with a railing? : Total 6 Click Score: 6    End of Session   Activity Tolerance: Patient limited by pain Patient left: in bed;with call bell/phone within reach Nurse Communication: Mobility status;Need for lift equipment PT Visit Diagnosis: Difficulty in walking, not elsewhere classified (R26.2);Pain;History of falling (Z91.81);Repeated falls (R29.6) Pain - Right/Left: Right Pain - part of body: Hip    Time: 6644-0347 PT Time Calculation (min) (ACUTE ONLY): 44 min   Charges:   PT Evaluation $PT Eval Moderate Complexity: 1 Mod PT Treatments $Therapeutic Activity: 23-37 mins         Blondell Reveal Kistler PT 08/16/2020  Acute Rehabilitation Services Pager [phone] Office [phone]

## 2020-08-17 DIAGNOSIS — S7291XD Unspecified fracture of right femur, subsequent encounter for closed fracture with routine healing: Secondary | ICD-10-CM | POA: Diagnosis not present

## 2020-08-17 DIAGNOSIS — M25562 Pain in left knee: Secondary | ICD-10-CM | POA: Diagnosis not present

## 2020-08-17 DIAGNOSIS — I110 Hypertensive heart disease with heart failure: Secondary | ICD-10-CM | POA: Diagnosis not present

## 2020-08-17 DIAGNOSIS — M6259 Muscle wasting and atrophy, not elsewhere classified, multiple sites: Secondary | ICD-10-CM | POA: Diagnosis not present

## 2020-08-17 DIAGNOSIS — Z7401 Bed confinement status: Secondary | ICD-10-CM | POA: Diagnosis not present

## 2020-08-17 DIAGNOSIS — E119 Type 2 diabetes mellitus without complications: Secondary | ICD-10-CM | POA: Diagnosis not present

## 2020-08-17 DIAGNOSIS — S72451D Displaced supracondylar fracture without intracondylar extension of lower end of right femur, subsequent encounter for closed fracture with routine healing: Secondary | ICD-10-CM | POA: Diagnosis not present

## 2020-08-17 DIAGNOSIS — M47816 Spondylosis without myelopathy or radiculopathy, lumbar region: Secondary | ICD-10-CM | POA: Diagnosis not present

## 2020-08-17 DIAGNOSIS — M25561 Pain in right knee: Secondary | ICD-10-CM | POA: Diagnosis not present

## 2020-08-17 DIAGNOSIS — R5381 Other malaise: Secondary | ICD-10-CM | POA: Diagnosis not present

## 2020-08-17 DIAGNOSIS — E118 Type 2 diabetes mellitus with unspecified complications: Secondary | ICD-10-CM | POA: Diagnosis not present

## 2020-08-17 DIAGNOSIS — Z20822 Contact with and (suspected) exposure to covid-19: Secondary | ICD-10-CM | POA: Diagnosis not present

## 2020-08-17 DIAGNOSIS — R0902 Hypoxemia: Secondary | ICD-10-CM | POA: Diagnosis not present

## 2020-08-17 DIAGNOSIS — E782 Mixed hyperlipidemia: Secondary | ICD-10-CM | POA: Diagnosis not present

## 2020-08-17 DIAGNOSIS — G4730 Sleep apnea, unspecified: Secondary | ICD-10-CM | POA: Diagnosis not present

## 2020-08-17 DIAGNOSIS — M2550 Pain in unspecified joint: Secondary | ICD-10-CM | POA: Diagnosis not present

## 2020-08-17 DIAGNOSIS — M79604 Pain in right leg: Secondary | ICD-10-CM | POA: Diagnosis not present

## 2020-08-17 DIAGNOSIS — E1140 Type 2 diabetes mellitus with diabetic neuropathy, unspecified: Secondary | ICD-10-CM | POA: Diagnosis not present

## 2020-08-17 DIAGNOSIS — I509 Heart failure, unspecified: Secondary | ICD-10-CM | POA: Diagnosis not present

## 2020-08-17 DIAGNOSIS — I959 Hypotension, unspecified: Secondary | ICD-10-CM | POA: Diagnosis not present

## 2020-08-17 DIAGNOSIS — I10 Essential (primary) hypertension: Secondary | ICD-10-CM | POA: Diagnosis not present

## 2020-08-17 DIAGNOSIS — E1165 Type 2 diabetes mellitus with hyperglycemia: Secondary | ICD-10-CM | POA: Diagnosis not present

## 2020-08-17 DIAGNOSIS — W19XXXA Unspecified fall, initial encounter: Secondary | ICD-10-CM | POA: Diagnosis not present

## 2020-08-17 DIAGNOSIS — H409 Unspecified glaucoma: Secondary | ICD-10-CM | POA: Diagnosis not present

## 2020-08-17 LAB — CBG MONITORING, ED
Glucose-Capillary: 111 mg/dL — ABNORMAL HIGH (ref 70–99)
Glucose-Capillary: 180 mg/dL — ABNORMAL HIGH (ref 70–99)

## 2020-08-17 NOTE — ED Notes (Signed)
Report called to Saralyn Pilar, RN at [facility]. No questions at this time. PTAR  notified for transport

## 2020-08-17 NOTE — ED Notes (Signed)
Stage 2 noted to rt buttock covered with foam.

## 2020-08-17 NOTE — Progress Notes (Signed)
Patient discharging to [facility]. Per Dr. Eulis Foster no EMTALA needed.

## 2020-08-17 NOTE — Progress Notes (Addendum)
11:30am: The patient will go to room 207 at [facility]. Patient will be transported via PTAR to facility, patient can arrive at the facility at 2pm. The number for report is [phone].  9:45am CSW sent patient's AVS to Abigail Butts at [facility] for review.  Madilyn Fireman, MSW, LCSW-A Transitions of Care   Clinical Social Worker  [facility] Emergency Departments   Medical ICU [phone]

## 2020-08-17 NOTE — ED Provider Notes (Signed)
8:25 AM-she is calm and comfortable and eating breakfast.  She is worried about quadricep weakness which was explained to the patient by  the physical therapist who saw her yesterday.  The patient is stable and awaiting placement.   Daleen Bo, MD 08/17/20 [phone]

## 2020-08-17 NOTE — ED Notes (Signed)
Patient is self monitoring and regulating her own  insulin pump.

## 2020-08-17 NOTE — Discharge Instructions (Signed)
Use Tylenol and heat to help the pain.

## 2020-08-18 LAB — URINE CULTURE: Culture: >=100000 — AB

## 2020-08-19 ENCOUNTER — Telehealth: Payer: Self-pay | Admitting: Emergency Medicine

## 2020-08-19 NOTE — Telephone Encounter (Signed)
Post ED Visit - Positive Culture Follow-up: Successful Patient Follow-Up  Culture assessed and recommendations reviewed by:  []  Elenor Quinones, Pharm.D. []  Heide Guile, Pharm.D., BCPS AQ-ID []  Parks Neptune, Pharm.D., BCPS []  Alycia Rossetti, Pharm.D., BCPS []  Ekron, Pharm.D., BCPS, AAHIVP []  Legrand Como, Pharm.D., BCPS, AAHIVP []  Salome Arnt, PharmD, BCPS []  Johnnette Gourd, PharmD, BCPS []  Hughes Better, PharmD, BCPS []  Leeroy Cha, PharmD Arturo Morton PharmD  Positive urine culture  []  Patient discharged without antimicrobial prescription and treatment is now indicated []  Organism is resistant to prescribed ED discharge antimicrobial []  Patient with positive blood cultures  Changes discussed with ED provider: Suella Broad PA New antibiotic prescription symptom check, if + symptoms, start Cephalexin 500mg  po q 6 hours x 5 days  Attempting to contact patient    Hazle Nordmann 08/19/2020, 11:52 AM

## 2020-08-19 NOTE — Progress Notes (Signed)
ED Antimicrobial Stewardship Positive Culture Follow Up   Jessica [name] is an 74 y.o. female who presented to [facility] on 08/15/2020 with a chief complaint of  Chief Complaint  Patient presents with   Physical Evaluation    Recent Results (from the past 720 hour(s))  Respiratory Panel by RT PCR (Flu A&B, Covid) - Nasopharyngeal Swab     Status: None   Collection Time: 08/13/20  1:16 PM   Specimen: Nasopharyngeal Swab  Result Value Ref Range Status   SARS Coronavirus 2 by RT PCR NEGATIVE NEGATIVE Final    Comment: (NOTE) SARS-CoV-2 target nucleic acids are NOT DETECTED.  The SARS-CoV-2 RNA is generally detectable in upper respiratoy specimens during the acute phase of infection. The lowest concentration of SARS-CoV-2 viral copies this assay can detect is 131 copies/mL. A negative result does not preclude SARS-Cov-2 infection and should not be used as the sole basis for treatment or other patient management decisions. A negative result may occur with  improper specimen collection/handling, submission of specimen other than nasopharyngeal swab, presence of viral mutation(s) within the areas targeted by this assay, and inadequate number of viral copies (<131 copies/mL). A negative result must be combined with clinical observations, patient history, and epidemiological information. The expected result is Negative.  Fact Sheet for Patients:  PinkCheek.be  Fact Sheet for Healthcare Providers:  GravelBags.it  This test is no t yet approved or cleared by the [place] FDA and  has been authorized for detection and/or diagnosis of SARS-CoV-2 by FDA under an Emergency Use Authorization (EUA). This EUA will remain  in effect (meaning this test can be used) for the duration of the COVID-19 declaration under Section 564(b)(1) of the Act, 21 U.S.C. section 360bbb-3(b)(1), unless the authorization is terminated or revoked  sooner.     Influenza A by PCR NEGATIVE NEGATIVE Final   Influenza B by PCR NEGATIVE NEGATIVE Final    Comment: (NOTE) The Xpert Xpress SARS-CoV-2/FLU/RSV assay is intended as an aid in  the diagnosis of influenza from Nasopharyngeal swab specimens and  should not be used as a sole basis for treatment. Nasal washings and  aspirates are unacceptable for Xpert Xpress SARS-CoV-2/FLU/RSV  testing.  Fact Sheet for Patients: PinkCheek.be  Fact Sheet for Healthcare Providers: GravelBags.it  This test is not yet approved or cleared by the [place] FDA and  has been authorized for detection and/or diagnosis of SARS-CoV-2 by  FDA under an Emergency Use Authorization (EUA). This EUA will remain  in effect (meaning this test can be used) for the duration of the  Covid-19 declaration under Section 564(b)(1) of the Act, 21  U.S.C. section 360bbb-3(b)(1), unless the authorization is  terminated or revoked. Performed at [facility], [place] [address] [place], [place] [address]   Urine Culture     Status: Abnormal   Collection Time: 08/15/20  7:54 PM   Specimen: Urine, Random  Result Value Ref Range Status   Specimen Description   Final    URINE, RANDOM Performed at [facility] [address] [place], [place] [address]    Special Requests   Final    NONE Performed at [facility], [place] [address] [place], [place] [address]    Culture >=100,000 COLONIES/mL ESCHERICHIA COLI (A)  Final   Report Status 08/18/2020 FINAL  Final   Organism ID, Bacteria ESCHERICHIA COLI (A)  Final      Susceptibility   Escherichia coli - MIC*  AMPICILLIN <=2 SENSITIVE Sensitive     CEFAZOLIN <=4 SENSITIVE Sensitive     CEFTRIAXONE <=0.25 SENSITIVE Sensitive     CIPROFLOXACIN <=0.25 SENSITIVE Sensitive     GENTAMICIN <=1 SENSITIVE Sensitive     IMIPENEM <=0.25 SENSITIVE Sensitive      NITROFURANTOIN <=16 SENSITIVE Sensitive     TRIMETH/SULFA <=20 SENSITIVE Sensitive     AMPICILLIN/SULBACTAM <=2 SENSITIVE Sensitive     PIP/TAZO <=4 SENSITIVE Sensitive     * >=100,000 COLONIES/mL ESCHERICHIA COLI  Respiratory Panel by RT PCR (Flu A&B, Covid) - Nasopharyngeal Swab     Status: None   Collection Time: 08/15/20  8:51 PM   Specimen: Nasopharyngeal Swab  Result Value Ref Range Status   SARS Coronavirus 2 by RT PCR NEGATIVE NEGATIVE Final    Comment: (NOTE) SARS-CoV-2 target nucleic acids are NOT DETECTED.  The SARS-CoV-2 RNA is generally detectable in upper respiratoy specimens during the acute phase of infection. The lowest concentration of SARS-CoV-2 viral copies this assay can detect is 131 copies/mL. A negative result does not preclude SARS-Cov-2 infection and should not be used as the sole basis for treatment or other patient management decisions. A negative result may occur with  improper specimen collection/handling, submission of specimen other than nasopharyngeal swab, presence of viral mutation(s) within the areas targeted by this assay, and inadequate number of viral copies (<131 copies/mL). A negative result must be combined with clinical observations, patient history, and epidemiological information. The expected result is Negative.  Fact Sheet for Patients:  PinkCheek.be  Fact Sheet for Healthcare Providers:  GravelBags.it  This test is no t yet approved or cleared by the [place] FDA and  has been authorized for detection and/or diagnosis of SARS-CoV-2 by FDA under an Emergency Use Authorization (EUA). This EUA will remain  in effect (meaning this test can be used) for the duration of the COVID-19 declaration under Section 564(b)(1) of the Act, 21 U.S.C. section 360bbb-3(b)(1), unless the authorization is terminated or revoked sooner.     Influenza A by PCR NEGATIVE NEGATIVE Final    Influenza B by PCR NEGATIVE NEGATIVE Final    Comment: (NOTE) The Xpert Xpress SARS-CoV-2/FLU/RSV assay is intended as an aid in  the diagnosis of influenza from Nasopharyngeal swab specimens and  should not be used as a sole basis for treatment. Nasal washings and  aspirates are unacceptable for Xpert Xpress SARS-CoV-2/FLU/RSV  testing.  Fact Sheet for Patients: PinkCheek.be  Fact Sheet for Healthcare Providers: GravelBags.it  This test is not yet approved or cleared by the [place] FDA and  has been authorized for detection and/or diagnosis of SARS-CoV-2 by  FDA under an Emergency Use Authorization (EUA). This EUA will remain  in effect (meaning this test can be used) for the duration of the  Covid-19 declaration under Section 564(b)(1) of the Act, 21  U.S.C. section 360bbb-3(b)(1), unless the authorization is  terminated or revoked. Performed at [facility], [place] [address], [place],  [address]     [x]  Patient discharged originally without antimicrobial agent and treatment is now indicated  New antibiotic prescription: Flow Manager to call patient for symptom check. If patient experiencing urinary symptoms (dysuria, frequency, etc.), then start cephalexin 500mg  PO Q6h x 5 days. If no urinary symptoms, no further treatment indicated for asymptomatic bacteriuria.  ED Provider: Suella Broad, PA-C   Margretta Sidle Dohlen 08/19/2020, 7:43 AM Clinical Pharmacist Monday - Friday phone -  [phone] Saturday - Sunday phone - [phone]

## 2020-08-20 DIAGNOSIS — E1165 Type 2 diabetes mellitus with hyperglycemia: Secondary | ICD-10-CM | POA: Diagnosis not present

## 2020-08-20 DIAGNOSIS — I10 Essential (primary) hypertension: Secondary | ICD-10-CM | POA: Diagnosis not present

## 2020-08-20 DIAGNOSIS — W19XXXA Unspecified fall, initial encounter: Secondary | ICD-10-CM | POA: Diagnosis not present

## 2020-08-30 DIAGNOSIS — E782 Mixed hyperlipidemia: Secondary | ICD-10-CM | POA: Diagnosis not present

## 2020-08-30 DIAGNOSIS — E118 Type 2 diabetes mellitus with unspecified complications: Secondary | ICD-10-CM | POA: Diagnosis not present

## 2020-08-30 DIAGNOSIS — I10 Essential (primary) hypertension: Secondary | ICD-10-CM | POA: Diagnosis not present

## 2020-08-30 DIAGNOSIS — H409 Unspecified glaucoma: Secondary | ICD-10-CM | POA: Diagnosis not present

## 2020-08-30 DIAGNOSIS — G4730 Sleep apnea, unspecified: Secondary | ICD-10-CM | POA: Diagnosis not present

## 2020-08-30 DIAGNOSIS — S72451D Displaced supracondylar fracture without intracondylar extension of lower end of right femur, subsequent encounter for closed fracture with routine healing: Secondary | ICD-10-CM | POA: Diagnosis not present

## 2020-08-30 DIAGNOSIS — I509 Heart failure, unspecified: Secondary | ICD-10-CM | POA: Diagnosis not present

## 2020-09-08 ENCOUNTER — Other Ambulatory Visit: Payer: Self-pay | Admitting: *Deleted

## 2020-09-08 NOTE — Patient Outreach (Signed)
Member screened for potential [facility] Care Management needs as a benefit of [place] Medicare.  Per Patient Jessica [name] member resides in [place] [facility].   Communication sent to [facility] SW to collaborate about anticipated dc plans and potential [facility] Care Management needs.  Will continue to follow while member resides in SNF.    Jessica Rolling, MSN, RN,BSN [place] Acute Care Coordinator [phone] [facility]) [phone]  (Toll free office)

## 2020-09-09 ENCOUNTER — Other Ambulatory Visit: Payer: Self-pay | Admitting: *Deleted

## 2020-09-09 DIAGNOSIS — M25561 Pain in right knee: Secondary | ICD-10-CM | POA: Diagnosis not present

## 2020-09-09 DIAGNOSIS — M25562 Pain in left knee: Secondary | ICD-10-CM | POA: Diagnosis not present

## 2020-09-09 NOTE — Patient Outreach (Signed)
[facility] Post- Acute Care Coordinator follow up. Member screened for potential [facility] Care Management needs as a benefit of [place] Medicare.  Update received from [facility] SN[facility] SW indicating member's transition plan is to return home independently. States member could benefit from [place] Management services.   Will continue to follow and plan outreach accordingly to discuss [place] Management program.  Marthenia Rolling, MSN, RN,BSN [place] Acute Care Coordinator [phone] [facility]) [phone]  (Toll free office)

## 2020-09-16 ENCOUNTER — Other Ambulatory Visit: Payer: Self-pay | Admitting: *Deleted

## 2020-09-16 NOTE — Patient Outreach (Signed)
[place] Coordinator follow up. Member screened for potential [facility] Care Management needs as a benefit of [place] Medicare.  Return call received from [name]. Patient identifiers confirmed.    [name] confirms she came home on 09/14/20.States she is awaiting to hear from [facility]. States she pays a med tech/CNA privately to assist her as needed. Confirms she has medical alert system thru [place].   Explained [place] Management services. She is agreeable. States she will also needs assist with transportation to MD appointments. Agreeable to [facility] RNCM and [facility] LCSW.   Has upcoming MD appointment on Monday, 09/22/2020 with PCP. Cardiologist appointment on 09/26/20.  Uses [employer] for meals. However, may be interested in [employer] program. [name] also reports she is in the process of applying for SCAT for ongoing transportation needs.   [name] lives alone with limited support.   Discussed [facility] for home visits. [name] is agreeable Remote Health services. Writer to make Remote referral.   Will also email member [place] Management contact information.   Will make referral to [place] Management for [facility] and LCSW follow up.   Spoke with Jessica [name] with [facility] who endorses [facility] was active with member prior to admission. Made Jessica [name] aware member is at home from [facility]. Jessica [name] to make [place] office aware for follow up.  Member reports that she usually does not answer the phone of unknown numbers. However, she states she will return call if message is left.   Will make appropriate referrals and email [place] Management information.     Jessica Rolling, MSN, Jessica [name],Jessica [name] [place] Acute Care Coordinator [phone] [facility]) [phone]  (Toll free office)

## 2020-09-16 NOTE — Patient Outreach (Signed)
[facility]N Post- Acute Care Coordinator follow up. Member screened for potential [facility] Care Management needs as a benefit of [place] Medicare.  Verified in Patient Pear[place] that member transitioned to home on 09/14/20 from [facility].   Telephone call made to Ms. Shasteen [phone] to discuss [place] Management services. No answer. HIPAA compliant voicemail message left to request return call.   Member lives alone. Has history of falls, CHF, DM, HLD, depression.  If no return call, will attempt to reach member once more before making [place] referral.    Marthenia Rolling, MSN, RN,BSN [place] Acute Care Coordinator [phone] [facility]) [phone]  (Toll free office)

## 2020-09-17 ENCOUNTER — Other Ambulatory Visit: Payer: Self-pay

## 2020-09-17 NOTE — Patient Outreach (Signed)
[facility]) Care Management  09/17/2020  Jessica [name] Jan 19, 1946 [id number]   New referral for falls post SNF discharge.  Placed call to patient with no answer. Left a message requesting a call back.  PLAN: will await a call back. Will send unsuccessful outreach letter and attempt again in 3-4 business days.  Tomasa Rand, RN, BSN, CEN [facility] [employer] [phone]

## 2020-09-22 ENCOUNTER — Other Ambulatory Visit: Payer: Self-pay

## 2020-09-22 DIAGNOSIS — M25551 Pain in right hip: Secondary | ICD-10-CM | POA: Diagnosis not present

## 2020-09-22 DIAGNOSIS — N3946 Mixed incontinence: Secondary | ICD-10-CM | POA: Diagnosis not present

## 2020-09-22 DIAGNOSIS — Z23 Encounter for immunization: Secondary | ICD-10-CM | POA: Diagnosis not present

## 2020-09-22 DIAGNOSIS — E782 Mixed hyperlipidemia: Secondary | ICD-10-CM | POA: Diagnosis not present

## 2020-09-22 NOTE — Patient Outreach (Signed)
[facility]) Care Management  09/22/2020  Jessica [name] June 03, 1946 [id number]   Telephone assessment:  Placed call to patient who answered and identified herself. Reports she is at her PCP visit and can not talk. Offered to call back later and she agreed.   3:45pm:  Placed 2nd call to patient with no answer. Left a message requesting a call back.  PLAN: will await a call back. If no response will attempt outreach again in 3 days.   Tomasa Rand, RN, BSN, CEN [facility] [employer] [phone]

## 2020-09-23 ENCOUNTER — Other Ambulatory Visit: Payer: Self-pay

## 2020-09-23 NOTE — Patient Outreach (Signed)
[facility]) Care Management  09/23/2020  Jessica [name] Sep 17, 1946 [id number]   Telephone assessment: Voicemail received from patient returning my call. I returned call to patient who reports she is getting stronger. Reports she has hired a caregiver who works as patient needs.  Reports she saw her PCP yesterday and was diagnosed with a sinus infection. Patient reports she has all her medications and is taking them as prescribed. Denies any concerns about medications. Reports her Dm is under good control .Reports CBG 150-160 which is where she wants it to be.  Reports she is interested in mobile meals and assistance with SCAT application.  Patient reports she lives alone.    Patient also reports that [facility] has not started services yet.    PLAN: will place order for social worker to assist with mobile meals, SCAT.  Placed call to [place] at [facility] to inquire about home health. I was informed no orders. I reviewed with Vickie that Marthenia Rolling had spoken with [facility].  Vickie reached out to [facility] and will be awaiting orders later today. I will plan to follow up in the morning to confirm orders received. No other needs identified at this time.    Tomasa Rand, RN, BSN, CEN [facility] [employer] [phone]

## 2020-09-24 ENCOUNTER — Other Ambulatory Visit: Payer: Self-pay

## 2020-09-24 NOTE — Patient Outreach (Signed)
[facility]) Care Management  09/24/2020  Jessica [name] 1946/03/28 [id number]   Care Coordination:  Placed call to [facility] to follow up on home health orders. Spoke with Vickie and she reports nothing from [place] yet.    PLAN:  Will await a call back to confirm orders received.   Tomasa Rand, RN, BSN, CEN [facility] [employer] [phone]

## 2020-09-26 ENCOUNTER — Encounter: Payer: Self-pay | Admitting: *Deleted

## 2020-09-26 ENCOUNTER — Other Ambulatory Visit: Payer: Self-pay

## 2020-09-26 ENCOUNTER — Other Ambulatory Visit: Payer: Self-pay | Admitting: *Deleted

## 2020-09-26 ENCOUNTER — Encounter: Payer: Self-pay | Admitting: Cardiology

## 2020-09-26 ENCOUNTER — Ambulatory Visit (INDEPENDENT_AMBULATORY_CARE_PROVIDER_SITE_OTHER): Payer: Medicare Other | Admitting: Cardiology

## 2020-09-26 VITALS — BP 140/72 | HR 79 | Ht 59.0 in | Wt 227.4 lb

## 2020-09-26 DIAGNOSIS — E7800 Pure hypercholesterolemia, unspecified: Secondary | ICD-10-CM | POA: Diagnosis not present

## 2020-09-26 DIAGNOSIS — I10 Essential (primary) hypertension: Secondary | ICD-10-CM | POA: Diagnosis not present

## 2020-09-26 NOTE — Patient Instructions (Signed)
Medication Instructions:  The current medical regimen is effective;  continue present plan and medications.  *If you need a refill on your cardiac medications before your next appointment, please call your pharmacy*  Follow-Up: At [facility], you and your health needs are our priority.  As part of our continuing mission to provide you with exceptional heart care, we have created designated Provider Care Teams.  These Care Teams include your primary Cardiologist (physician) and Advanced Practice Providers (APPs -  Physician Assistants and Nurse Practitioners) who all work together to provide you with the care you need, when you need it.  We recommend signing up for the patient portal called "MyChart".  Sign up information is provided on this After Visit Summary.  MyChart is used to connect with patients for Virtual Visits (Telemedicine).  Patients are able to view lab/test results, encounter notes, upcoming appointments, etc.  Non-urgent messages can be sent to your provider as well.   To learn more about what you can do with MyChart, go to [email or web address].    Your next appointment:   12 month(s)  The format for your next appointment:   In Person  Provider:   Mark Skains, MD   Thank you for choosing [facility]!!      

## 2020-09-26 NOTE — Patient Outreach (Signed)
[facility]) Care Management  09/26/2020  HYLA COARD Dec 04, 1945 [id number]  CSW received an incoming call from patient today, in response to the HIPAA compliant message left on voicemail for patient by CSW earlier in the day.  CSW was able to perform the initial phone assessment with patient today, as well as assess and assist with social work needs and services.  CSW introduced self, explained role and types of services provided through [place] [facility] ([place] [facility]).  CSW further explained to patient that CSW works with patient's RNCM, also with [place] [facility], Tomasa Rand.  CSW then explained the reason for the call, indicating that Ms. Lacinda Axon thought that patient would benefit from social work services and resources to assist with arranging [employer], through [employer] [facility] [place], as well as arranging transportation, to and from patient's physician appointments.  CSW obtained two HIPAA compliant identifiers from patient, which included patient's name and date of birth.  Patient admitted that she was able to drive before she suffered injury from a fall, not yet having received medical clearance from her physicians to get behind the wheel of a car again. Patient went on to explain that she has not scheduled any follow-up appointments with her physicians, for fear that she would not have transportation.  Patient reported that she needs to schedule appointments with all of the following physicians:  Endocrinology, Diabetes & Metabolism Specialist, Dr. Jacelyn Pi with [facility]; Primary Care Physician, Dr. Cari Caraway with [place] [facility]; Radiologist, Dr. Johnnette Gourd with [place]; Ophthalmologist, Dr. Marshall Cork with [facility].  In addition, patient indicated that she needs to be transported to [facility] ([address] [place],  [place] [address]) to receive the second dose of her Pneumococcal Vaccine, as the first vaccination was administered today.  CSW encouraged patient to go ahead and schedule appointments with all of her providers, agreeing to arrange transportation to all of her upcoming appointments, through [facility], courtesy of [place] [facility].  CSW has also submitted a transportation request to [facility] to transport patient to [facility] for her second booster, on Friday, October 10, 2020 at 10:00AM.  Please see below, the completed [facility] Patient Intake Request Form, submitted today.  CSW has also agreed to e-mail ([email or web address]) patient, per her request, a list of transportation resources for older adults, as well as an application for [employer]).  CSW explained to patient that CSW would be more than happy to assist with completion and submission of the application, if necessary.     CSW was able to make a referral for patient to [employer], through [employer] [facility] [place], through the [employer], first obtaining patient's verbal consent to do so.  Patient is aware that she will be receiving a call from a representative from [employer] to complete a telephone assessment to determine eligibility.  Patient is also aware that there is currently an extensive waiting list for [employer].  Patient reported that she is receiving home health services through [facility], in the form of nursing and physical therapy, hoping to regain her strength and mobility.  Patient is currently able to perform all activities of daily living independently.  Patient agreed to contact CSW as soon as she is able to schedule all of the above physician appointments.  Otherwise, patient was agreeable to having CSW follow-up  with her again on Monday, October 06, 2020, around [place] Patient Intake Request   Patients Name Mylynn Dinh Patients Phone Number [phone]  Patient DOB 24-Jul-1946 Patient MRN   [id number]    Patient Home Address  [address], [place]  [place], [place] [address] Patient Insurance  [id number] Medicare Part A & B  Pick Up Ride Information   Date of Request  Friday, September 26, 2020 Pick Up Date Friday, October 10, 2020  Pick up Location [address], [place]  [place], [place] [address] Pick Up Time  9:30AM  Does pt. use cane, walker, transfer wheelchair?  Walker Is patient traveling alone?                   Yes  Dropoff Location [place], [place], [place] [address] Appointment Time 10:00AM  Should driver wait for pt. for return trip?   *This should only be used for patients with appts. Over 1 hour* Yes, please remain with the patient for the duration of the appointment.  Return [place] up Location [facility] [address], [place], [place] [address] Pick up Time Directly after appointment.  Does pt. use cane, walker, transfer wheelchair?  Walker Is patient traveling alone? Yes  Dropoff/Return Location  [address],  [place], [place] [address]  Intake Logistics   Department  [facility]  [phone]  Ride Scheduled by: (TC Name) Nat Christen, LCSW Pt. Referred By Nat Christen, LCSW     Nat Christen, BSW, MSW, [place]  Licensed Clinical Social Worker  [place]  Mailing [place]. [address] [place], [place] [address] Physical Address-[address], [place], [place] [address] Toll Free Main # [phone] Fax # [phone] Cell # [phone]  [email or web address]

## 2020-09-26 NOTE — Progress Notes (Signed)
Cardiology Office Note:    Date:  09/26/2020   ID:  Jessica [name], Jessica [name] Nov 28, 1945, MRN [id number]  PCP:  Cari Caraway, MD  [facility] Cardiologist:  Candee Furbish, MD  [facility] Electrophysiologist:  None   Referring MD: Cari Caraway, MD     History of Present Illness:    Jessica [name] is a 74 y.o. female here for the follow-up of hypertension hyperlipidemia.  Cardiac catheterization in 2010: No evidence of flow-limiting coronary artery disease.  Has had repeated falls.  Has had outpatient rehabilitation for this.  Echocardiogram demonstrated normal ejection fraction.  Increased left atrial pressures.  Uses CPAP for obstructive sleep apnea.  Palpitations - metoprol  Past Medical History:  Diagnosis Date   Anemia    Anxiety    Arthritis    Cataract    Depression    Diabetes mellitus without complication (HCC)    Diastolic dysfunction    Frozen shoulder    Glaucoma    Headache    History of migraines   Heart murmur    History of kidney stones    Hyperlipidemia    Hypertension    Macular degeneration    Neuropathy    Obesity    Sleep apnea    Stroke [facility])    has been told by a Dr. Mort Sawyers that she may have had a mini stroke but no further testing was done to confirm   Tachycardia     Past Surgical History:  Procedure Laterality Date   CARDIAC CATHETERIZATION     -6/09-normal ejection fraction 65%, no angiographically significant CAD., M. Xachary Hambly MD   CESAREAN SECTION     COLONOSCOPY     foot spurs removed     HYSTEROSCOPY WITH D & C N/A 06/28/2017   Procedure: DILATATION AND CURETTAGE /HYSTEROSCOPY/POLYPECTOMY WITH MYOSURE;  Surgeon: Christophe Louis, MD;  Location: [facility];  Service: Gynecology;  Laterality: N/A;   JOINT REPLACEMENT     right knee replaced   knee spurs removed     ORIF FEMUR FRACTURE Right 04/19/2019   Procedure: OPEN REDUCTION INTERNAL FIXATION (ORIF) DISTAL FEMUR FRACTURE;  Surgeon: Rod Can, MD;  Location: [facility];  Service: Orthopedics;  Laterality: Right;   shoulder spurs removed     TONSILLECTOMY AND ADENOIDECTOMY     UPPER GI ENDOSCOPY      Current Medications: Current Meds  Medication Sig   acetaminophen (TYLENOL) 325 MG tablet Take 2 tablets (650 mg total) by mouth every 6 (six) hours as needed for mild pain. Do not take more than 4000mg  of tylenol per day   acetaminophen (TYLENOL) 650 MG CR tablet Take 650 mg by mouth every 8 (eight) hours as needed for pain.   B-D INS SYRINGE 0.5CC/31GX5/16 31G X 5/16" 0.5 ML MISC Use as directed per provider.   Cholecalciferol (VITAMIN D3) 50 MCG (2000 UT) capsule Take 2,000 Units by mouth daily.   Coenzyme Q10 (CO Q-10) 100 MG CAPS Take 100 mg by mouth at bedtime.    dorzolamidel-timolol (COSOPT) 22.3-6.8 MG/ML SOLN ophthalmic solution Place 1 drop into both eyes 2 (two) times daily.   furosemide (LASIX) 20 MG tablet TAKE 1 TABLET DAILY   HYDROcodone-acetaminophen (NORCO/VICODIN) 5-325 MG tablet Take 1 tablet by mouth every 6 (six) hours as needed.   insulin lispro (HUMALOG) 100 UNIT/ML injection Insulin pump (Patient taking differently: Inject 3 Units into the skin continuous. Insulin pump. Maximum daily dose - 200 units)   INVOKANA 300 MG TABS  tablet Take 1 tablet (300 mg total) by mouth daily.   Iron-Vitamin C (VITRON-C) 65-125 MG TABS Take 1 tablet by mouth at bedtime.    losartan (COZAAR) 100 MG tablet TAKE 1 TABLET DAILY   melatonin 5 MG TABS Take 5 mg by mouth at bedtime.   metFORMIN (GLUCOPHAGE-XR) 500 MG 24 hr tablet Take 1 tablet (500 mg total) by mouth at bedtime.   metoprolol tartrate (LOPRESSOR) 100 MG tablet Take 1 tablet (100 mg total) by mouth 2 (two) times daily.   Multiple Vitamins-Minerals (PRESERVISION AREDS 2 PO) Take 1 tablet by mouth 2 (two) times daily.   ONE TOUCH ULTRA TEST test strip 1 each by Other route as needed (Use as directed per provider).    oxybutynin (DITROPAN-XL) 5 MG 24  hr tablet Take 5 mg by mouth at bedtime.   simvastatin (ZOCOR) 40 MG tablet Take 1 tablet (40 mg total) by mouth every evening.   spironolactone (ALDACTONE) 25 MG tablet TAKE 1 TABLET DAILY   Travoprost, BAK Free, (TRAVATAN) 0.004 % SOLN ophthalmic solution Place 1 drop into both eyes at bedtime.      Allergies:   Meloxicam, Percocet [oxycodone-acetaminophen], Tramadol, Vicodin [hydrocodone-acetaminophen], Azithromycin, Iodine, and Shellfish allergy   Social History   Socioeconomic History   Marital status: Single    Spouse name: Not on file   Number of children: Not on file   Years of education: Not on file   Highest education level: Not on file  Occupational History   Not on file  Tobacco Use   Smoking status: Never Smoker   Smokeless tobacco: Never Used  Vaping Use   Vaping Use: Never used  Substance and Sexual Activity   Alcohol use: No    Comment: maybe once a year   Drug use: No   Sexual activity: Not on file  Other Topics Concern   Not on file  Social History Narrative   Never smoker, no alcohol, vegetarian.   Social Determinants of Health   Financial Resource Strain:    Difficulty of Paying Living Expenses: Not on file  Food Insecurity:    Worried About Charity fundraiser in the Last Year: Not on file   [employer] of Food in the Last Year: Not on file  Transportation Needs:    Lack of Transportation (Medical): Not on file   Lack of Transportation (Non-Medical): Not on file  Physical Activity:    Days of Exercise per Week: Not on file   Minutes of Exercise per Session: Not on file  Stress:    Feeling of Stress : Not on file  Social Connections:    Frequency of Communication with Friends and Family: Not on file   Frequency of Social Gatherings with Friends and Family: Not on file   Attends Religious Services: Not on file   Active Member of Clubs or Organizations: Not on file   Attends Archivist Meetings: Not on file    Marital Status: Not on file     Family History: The patient's family history includes Brain cancer in her mother; Colon polyps in her father.  ROS:   Please see the history of present illness.     All other systems reviewed and are negative.  EKGs/Labs/Other Studies Reviewed:     EKG: EKG on 08/13/2020 shows sinus rhythm 98 with no other abnormalities.  Recent Labs: 08/15/2020: ALT 16; BUN 26; Creatinine, Ser 0.77; Hemoglobin 13.8; Platelets 204; Potassium 4.0; Sodium 141  Recent Lipid  Panel    Component Value Date/Time   CHOL 165 09/03/2014 1651   TRIG (H) 09/03/2014 1651    574.0 Triglyceride is over 400; calculations on Lipids are invalid.   HDL 32.70 (L) 09/03/2014 1651   CHOLHDL 5 09/03/2014 1651   VLDL 114.8 (H) 09/03/2014 1651   LDLDIRECT 67.3 09/03/2014 1651     Physical Exam:    VS:  BP 140/72 (BP Location: Right Arm, Patient Position: Sitting, Cuff Size: Large)    Pulse 79    Ht 4\' 11"  (1.499 m)    Wt 227 lb 6.4 oz (103.1 kg)    LMP  (LMP Unknown)    SpO2 95%    BMI 45.93 kg/m     Wt Readings from Last 3 Encounters:  09/26/20 227 lb 6.4 oz (103.1 kg)  08/13/20 230 lb (104.3 kg)  09/27/19 229 lb (103.9 kg)     GEN: Obese well nourished, well developed in no acute distress HEENT: Normal NECK: No JVD; No carotid bruits LYMPHATICS: No lymphadenopathy CARDIAC: RRR, no murmurs, rubs, gallops RESPIRATORY:  Clear to auscultation without rales, wheezing or rhonchi  ABDOMEN: Soft, non-tender, non-distended MUSCULOSKELETAL:  No edema; No deformity  SKIN: Warm and dry NEUROLOGIC:  Alert and oriented x 3 PSYCHIATRIC:  Normal affect   ASSESSMENT:    1. Essential hypertension   2. Pure hypercholesterolemia   3. Morbid obesity due to excess calories (HCC)    PLAN:    In order of problems listed above:  Essential hypertension -Fairly well controlled.  Continue with losartan, metoprolol, spironolactone.  No changes made.  Palpitations -She is taking  metoprolol twice a day.  Now that she is taking this on a more scheduled basis 7 AM, 7 PM she feels better.  While she was in the nursing home she stated that she was given the metoprolol sometimes later or earlier than usual and she may feel some extra skips at that time.  Diabetes with hypertension -Hemoglobin A1c 7.5.  Close to goal.  Hyperlipidemia -LDL 81, simvastatin.  Excellent.  Frequent falls -Continuing with rehabilitation efforts.  Aortic atherosclerosis -Seen on CT scan 08/13/2020.  Continue with simvastatin.  Morbid obesity -Continue to encourage weight loss.  Weight is down.  Medication Adjustments/Labs and Tests Ordered: Current medicines are reviewed at length with the patient today.  Concerns regarding medicines are outlined above.  No orders of the defined types were placed in this encounter.  No orders of the defined types were placed in this encounter.   Patient Instructions  Medication Instructions:  The current medical regimen is effective;  continue present plan and medications.  *If you need a refill on your cardiac medications before your next appointment, please call your pharmacy*  Follow-Up: At [facility], you and your health needs are our priority.  As part of our continuing mission to provide you with exceptional heart care, we have created designated Provider Care Teams.  These Care Teams include your primary Cardiologist (physician) and Advanced Practice Providers (APPs -  Physician Assistants and Nurse Practitioners) who all work together to provide you with the care you need, when you need it.  We recommend signing up for the patient portal called "MyChart".  Sign up information is provided on this After Visit Summary.  MyChart is used to connect with patients for Virtual Visits (Telemedicine).  Patients are able to view lab/test results, encounter notes, upcoming appointments, etc.  Non-urgent messages can be sent to your provider as well.   To  learn more about what you can do with MyChart, go to [email or web address].    Your next appointment:   12 month(s)  The format for your next appointment:   In Person  Provider:   Candee Furbish, MD   Thank you for choosing [facility]!!        Signed, Candee Furbish, MD  09/26/2020 11:42 AM    [place]

## 2020-09-26 NOTE — Patient Outreach (Signed)
[facility]) Care Management  09/26/2020  PARTHENA FERGESON December 04, 1945 [id number]   Telephone assessment:  Incoming call and voicemail from [place] who reports that they are waiting for office notes and PT order from Dr. Leonides Schanz.  I sent an in basket to Dr. Leonides Schanz to request information be sent over to [facility].   Placed call to patient and remote health was visiting. Updated patient and nurse with remote health about office notes and PT order needed.  Remote Health states that would follow up. Patient denies additional need from me and states she needs transportation. I reminded patient that I did place an order for social worker to assist with meals and transportation. Patient was appreciative and states she will call me in the future if she needs me otherwise she feels like she is all set.   PLAN: patient denies nursing needs. Set up with remote health and [facility] social worker to meet patients needs. Patient reports she has my number in her phone and will call me if needed.   Tomasa Rand, RN, BSN, CEN [facility] [employer] [phone]

## 2020-09-26 NOTE — Patient Outreach (Signed)
[facility]) Care Management  09/26/2020  KJERSTIN ABRIGO 02-Jun-1946 [id number]   CSW made an initial attempt to try and contact patient today to perform the phone assessment, as well as assess and assist with social work needs and services, without success.  A HIPAA compliant message was left for patient on voicemail, as CSW awaits a return call.  CSW will make a second outreach attempt within the next 3-4 business days, if a return call is not received from patient in the meantime.  CSW will also mail a Patient Unsuccessful Outreach Letter to patient's home, requesting that she contact CSW at her earliest convenience, if she is interested in receiving social work services through [place] with [facility].  Nat Christen, BSW, MSW, LCSW  Licensed Education officer, environmental Health System  Mailing [place] [address] [place], [place] [address] Physical Address-[address] [place], [place] [address] Toll Free Main # [phone] Fax # [phone] Cell # [phone]  [email or web address]

## 2020-09-29 ENCOUNTER — Telehealth: Payer: Self-pay | Admitting: Cardiology

## 2020-09-29 DIAGNOSIS — Z9181 History of falling: Secondary | ICD-10-CM | POA: Diagnosis not present

## 2020-09-29 DIAGNOSIS — M25551 Pain in right hip: Secondary | ICD-10-CM | POA: Diagnosis not present

## 2020-09-29 DIAGNOSIS — N3946 Mixed incontinence: Secondary | ICD-10-CM | POA: Diagnosis not present

## 2020-09-29 DIAGNOSIS — G4730 Sleep apnea, unspecified: Secondary | ICD-10-CM | POA: Diagnosis not present

## 2020-09-29 DIAGNOSIS — E1140 Type 2 diabetes mellitus with diabetic neuropathy, unspecified: Secondary | ICD-10-CM | POA: Diagnosis not present

## 2020-09-29 DIAGNOSIS — R42 Dizziness and giddiness: Secondary | ICD-10-CM | POA: Diagnosis not present

## 2020-09-29 DIAGNOSIS — Z9641 Presence of insulin pump (external) (internal): Secondary | ICD-10-CM | POA: Diagnosis not present

## 2020-09-29 DIAGNOSIS — M47816 Spondylosis without myelopathy or radiculopathy, lumbar region: Secondary | ICD-10-CM | POA: Diagnosis not present

## 2020-09-29 DIAGNOSIS — I700 Atherosclerosis of aorta: Secondary | ICD-10-CM | POA: Diagnosis not present

## 2020-09-29 DIAGNOSIS — D509 Iron deficiency anemia, unspecified: Secondary | ICD-10-CM | POA: Diagnosis not present

## 2020-09-29 DIAGNOSIS — F419 Anxiety disorder, unspecified: Secondary | ICD-10-CM | POA: Diagnosis not present

## 2020-09-29 DIAGNOSIS — E782 Mixed hyperlipidemia: Secondary | ICD-10-CM | POA: Diagnosis not present

## 2020-09-29 DIAGNOSIS — F32A Depression, unspecified: Secondary | ICD-10-CM | POA: Diagnosis not present

## 2020-09-29 DIAGNOSIS — H409 Unspecified glaucoma: Secondary | ICD-10-CM | POA: Diagnosis not present

## 2020-09-29 DIAGNOSIS — I5030 Unspecified diastolic (congestive) heart failure: Secondary | ICD-10-CM | POA: Diagnosis not present

## 2020-09-29 DIAGNOSIS — H3530 Unspecified macular degeneration: Secondary | ICD-10-CM | POA: Diagnosis not present

## 2020-09-29 DIAGNOSIS — Z7984 Long term (current) use of oral hypoglycemic drugs: Secondary | ICD-10-CM | POA: Diagnosis not present

## 2020-09-29 DIAGNOSIS — I10 Essential (primary) hypertension: Secondary | ICD-10-CM | POA: Diagnosis not present

## 2020-09-29 DIAGNOSIS — M25561 Pain in right knee: Secondary | ICD-10-CM | POA: Diagnosis not present

## 2020-09-29 DIAGNOSIS — Z9981 Dependence on supplemental oxygen: Secondary | ICD-10-CM | POA: Diagnosis not present

## 2020-09-29 DIAGNOSIS — Z96651 Presence of right artificial knee joint: Secondary | ICD-10-CM | POA: Diagnosis not present

## 2020-09-29 DIAGNOSIS — E7800 Pure hypercholesterolemia, unspecified: Secondary | ICD-10-CM | POA: Diagnosis not present

## 2020-09-29 DIAGNOSIS — Z6841 Body Mass Index (BMI) 40.0 and over, adult: Secondary | ICD-10-CM | POA: Diagnosis not present

## 2020-09-29 DIAGNOSIS — I110 Hypertensive heart disease with heart failure: Secondary | ICD-10-CM | POA: Diagnosis not present

## 2020-09-29 NOTE — Patient Outreach (Signed)
[facility]) Care Management  09/29/2020  SANAI FRICK [birth year] [id number]   Erroneous Encounter.  Nat Christen, BSW, MSW, LCSW  Licensed Education officer, environmental Health System  Mailing [place] [address], [place], [place] [address] Physical Address[address], [place], [place] [address] Toll Free Main # [phone] Fax # [phone] Cell # [phone]  [email or web address]

## 2020-09-29 NOTE — Telephone Encounter (Signed)
New Message:     Clair Gulling is calling for pt's diagnosis,

## 2020-09-30 NOTE — Telephone Encounter (Signed)
Message was in time out

## 2020-09-30 NOTE — Telephone Encounter (Signed)
Spoke with Clair Gulling.  He is physical therapist who will be working with this pt.  Pt had stated she has CHF and Jim needed to know if she does in order to do appropriate teaching for her.  Advised she does carry the DX: CHF.   Clair Gulling thanked me for the call back and information.

## 2020-10-01 DIAGNOSIS — M25561 Pain in right knee: Secondary | ICD-10-CM | POA: Diagnosis not present

## 2020-10-01 DIAGNOSIS — E1140 Type 2 diabetes mellitus with diabetic neuropathy, unspecified: Secondary | ICD-10-CM | POA: Diagnosis not present

## 2020-10-01 DIAGNOSIS — I5030 Unspecified diastolic (congestive) heart failure: Secondary | ICD-10-CM | POA: Diagnosis not present

## 2020-10-01 DIAGNOSIS — M47816 Spondylosis without myelopathy or radiculopathy, lumbar region: Secondary | ICD-10-CM | POA: Diagnosis not present

## 2020-10-01 DIAGNOSIS — M25551 Pain in right hip: Secondary | ICD-10-CM | POA: Diagnosis not present

## 2020-10-01 DIAGNOSIS — I110 Hypertensive heart disease with heart failure: Secondary | ICD-10-CM | POA: Diagnosis not present

## 2020-10-02 ENCOUNTER — Ambulatory Visit: Payer: Self-pay | Admitting: *Deleted

## 2020-10-02 DIAGNOSIS — I110 Hypertensive heart disease with heart failure: Secondary | ICD-10-CM | POA: Diagnosis not present

## 2020-10-02 DIAGNOSIS — M47816 Spondylosis without myelopathy or radiculopathy, lumbar region: Secondary | ICD-10-CM | POA: Diagnosis not present

## 2020-10-02 DIAGNOSIS — M25561 Pain in right knee: Secondary | ICD-10-CM | POA: Diagnosis not present

## 2020-10-02 DIAGNOSIS — M25551 Pain in right hip: Secondary | ICD-10-CM | POA: Diagnosis not present

## 2020-10-02 DIAGNOSIS — I5030 Unspecified diastolic (congestive) heart failure: Secondary | ICD-10-CM | POA: Diagnosis not present

## 2020-10-02 DIAGNOSIS — E1140 Type 2 diabetes mellitus with diabetic neuropathy, unspecified: Secondary | ICD-10-CM | POA: Diagnosis not present

## 2020-10-03 DIAGNOSIS — I5030 Unspecified diastolic (congestive) heart failure: Secondary | ICD-10-CM | POA: Diagnosis not present

## 2020-10-03 DIAGNOSIS — E1140 Type 2 diabetes mellitus with diabetic neuropathy, unspecified: Secondary | ICD-10-CM | POA: Diagnosis not present

## 2020-10-03 DIAGNOSIS — M25561 Pain in right knee: Secondary | ICD-10-CM | POA: Diagnosis not present

## 2020-10-03 DIAGNOSIS — I110 Hypertensive heart disease with heart failure: Secondary | ICD-10-CM | POA: Diagnosis not present

## 2020-10-03 DIAGNOSIS — M25551 Pain in right hip: Secondary | ICD-10-CM | POA: Diagnosis not present

## 2020-10-03 DIAGNOSIS — M47816 Spondylosis without myelopathy or radiculopathy, lumbar region: Secondary | ICD-10-CM | POA: Diagnosis not present

## 2020-10-06 ENCOUNTER — Other Ambulatory Visit: Payer: Self-pay | Admitting: *Deleted

## 2020-10-06 NOTE — Patient Outreach (Signed)
[facility]) Care Management  10/06/2020  Jessica [name] Mar 19, 1946 [id number]   CSW made an attempt to try and contact patient today to follow-up regarding social work services and resources, as well as to ensure that she received the resources and applications that CSW e-mailed to her last week, per her request; however, patient was unavailable at the time of CSW's call.  CSW left a HIPAA compliant message on voicemail for patient and is currently awaiting a return call.  CSW will make a second outreach attempt within the next 3-4 business days, if a return call is not received from patient in the meantime.  Nat Christen, BSW, MSW, LCSW  Licensed Education officer, environmental Health System  Mailing [place] [address], [place], [place] [address] Physical Address-[address] [place], [place] [address] Toll Free Main # [phone] Fax # [phone] Cell # [phone]  [email or web address]

## 2020-10-08 ENCOUNTER — Encounter: Payer: Self-pay | Admitting: *Deleted

## 2020-10-08 ENCOUNTER — Other Ambulatory Visit: Payer: Self-pay | Admitting: *Deleted

## 2020-10-08 DIAGNOSIS — M25551 Pain in right hip: Secondary | ICD-10-CM | POA: Diagnosis not present

## 2020-10-08 DIAGNOSIS — M47816 Spondylosis without myelopathy or radiculopathy, lumbar region: Secondary | ICD-10-CM | POA: Diagnosis not present

## 2020-10-08 DIAGNOSIS — I5030 Unspecified diastolic (congestive) heart failure: Secondary | ICD-10-CM | POA: Diagnosis not present

## 2020-10-08 DIAGNOSIS — M25561 Pain in right knee: Secondary | ICD-10-CM | POA: Diagnosis not present

## 2020-10-08 DIAGNOSIS — E1140 Type 2 diabetes mellitus with diabetic neuropathy, unspecified: Secondary | ICD-10-CM | POA: Diagnosis not present

## 2020-10-08 DIAGNOSIS — I110 Hypertensive heart disease with heart failure: Secondary | ICD-10-CM | POA: Diagnosis not present

## 2020-10-08 NOTE — Patient Outreach (Signed)
[place] [facility]) Care Management  10/08/2020  Jessica [name] 1946-01-15 [id number]   CSW received an incoming call from patient today, in response to the HIPAA compliant message left on voicemail for patient by CSW earlier in the week.  Patient reported that she had just finished meeting with her home health nurse from [facility], now awaiting a visit from her home health physical therapist, also through [place].  Patient went on to explain that her home health nurse only performs home visits once a week, typically on Wednesday's, but that home health physical therapy and occupational therapy services are provided twice a week, one on Tuesday's and Thursday, the other on Monday's and Wednesday's.  Patient admitted that she believes she is getting much stronger, building more strength and stamina, and is able to stand for longer periods of time and walk further distances.  CSW commended patient for her progress.   Patient indicated that she received a call from a representative with [employer], through [employer] [facility] [place] her that they have received her application and are in the process of reviewing it.  Patient is aware that she should expect another call from a representative with [employer], as they will want to perform a telephone assessment with her to determine eligibility.  Patient will then be placed on the waiting list to receive nutritional services.  Patient denied having had an opportunity to contact any of her attending physicians to schedule appointments, having felt bad for the last week due to a sinus infection.  CSW voiced understanding, encouraging patient to go ahead and schedule follow-up appointments with her endocrinologist, primary care physician, radiologist and ophthalmologist, now that reports feeling much better.  CSW also reminded patient of her appointment with [facility] on Friday, October 10, 2020 at 10:00AM to receive  her second Pneumococcal vaccination.    CSW was able to arrange transportation for patient, to and from her appointment on the 17th, through [employer].  Patient confirmed having already received a call from a representative with [facility] to confirm her appointment time, date and location.  CSW explained to patient that she is now able to schedule all of her future transportation requests through [facility], now that [place] has gotten her established in their system.  CSW was able to confirm that patient has the correct contact information for [facility].  Patient admitted to receiving the list of transportation resources that CSW e-mailed to her, per her request, along with the [employer] Par[employer]ic) application.  Patient denied needing assistance with completing the application for [employer], but agreed to contact CSW directly if she changes her mind , or if additional social work needs arise in the near future.  Patient voiced a great deal of appreciation for all services provided to her by [place] [facility].  CSW will perform a case closure on patient, as all goals of treatment have been met from social work standpoint and no additional social work needs have been identified at this time.  CSW will notify patient's RNCM with [place] [facility], Tomasa Rand of CSW's plans to close patient's case.  CSW will fax an update to patient's Primary Care Physician, Dr. Cari Caraway to ensure that she is aware of CSW's involvement with patient's plan of care, in addition to routing a Physician Case Closure Letter.    Nat Christen, [place], MSW, [employer]  Licensed  Clinical Social Worker  [place]  Mailing [place] [address] [place], [place] [address] Physical Address-[address] [place], [place]  [address] Toll Free Main # [phone] Fax # [phone] Cell # [phone]  [email or web address]

## 2020-10-09 DIAGNOSIS — M25551 Pain in right hip: Secondary | ICD-10-CM | POA: Diagnosis not present

## 2020-10-09 DIAGNOSIS — I5030 Unspecified diastolic (congestive) heart failure: Secondary | ICD-10-CM | POA: Diagnosis not present

## 2020-10-09 DIAGNOSIS — M47816 Spondylosis without myelopathy or radiculopathy, lumbar region: Secondary | ICD-10-CM | POA: Diagnosis not present

## 2020-10-09 DIAGNOSIS — I110 Hypertensive heart disease with heart failure: Secondary | ICD-10-CM | POA: Diagnosis not present

## 2020-10-09 DIAGNOSIS — E1140 Type 2 diabetes mellitus with diabetic neuropathy, unspecified: Secondary | ICD-10-CM | POA: Diagnosis not present

## 2020-10-09 DIAGNOSIS — M25561 Pain in right knee: Secondary | ICD-10-CM | POA: Diagnosis not present

## 2020-10-10 ENCOUNTER — Ambulatory Visit: Payer: Self-pay | Admitting: *Deleted

## 2020-10-13 DIAGNOSIS — I110 Hypertensive heart disease with heart failure: Secondary | ICD-10-CM | POA: Diagnosis not present

## 2020-10-13 DIAGNOSIS — M25561 Pain in right knee: Secondary | ICD-10-CM | POA: Diagnosis not present

## 2020-10-13 DIAGNOSIS — M47816 Spondylosis without myelopathy or radiculopathy, lumbar region: Secondary | ICD-10-CM | POA: Diagnosis not present

## 2020-10-13 DIAGNOSIS — M25551 Pain in right hip: Secondary | ICD-10-CM | POA: Diagnosis not present

## 2020-10-13 DIAGNOSIS — E1140 Type 2 diabetes mellitus with diabetic neuropathy, unspecified: Secondary | ICD-10-CM | POA: Diagnosis not present

## 2020-10-13 DIAGNOSIS — I5030 Unspecified diastolic (congestive) heart failure: Secondary | ICD-10-CM | POA: Diagnosis not present

## 2020-10-14 DIAGNOSIS — M25551 Pain in right hip: Secondary | ICD-10-CM | POA: Diagnosis not present

## 2020-10-14 DIAGNOSIS — M25561 Pain in right knee: Secondary | ICD-10-CM | POA: Diagnosis not present

## 2020-10-14 DIAGNOSIS — I110 Hypertensive heart disease with heart failure: Secondary | ICD-10-CM | POA: Diagnosis not present

## 2020-10-14 DIAGNOSIS — M47816 Spondylosis without myelopathy or radiculopathy, lumbar region: Secondary | ICD-10-CM | POA: Diagnosis not present

## 2020-10-14 DIAGNOSIS — E1140 Type 2 diabetes mellitus with diabetic neuropathy, unspecified: Secondary | ICD-10-CM | POA: Diagnosis not present

## 2020-10-14 DIAGNOSIS — I5030 Unspecified diastolic (congestive) heart failure: Secondary | ICD-10-CM | POA: Diagnosis not present

## 2020-10-15 ENCOUNTER — Telehealth: Payer: Self-pay | Admitting: Family Medicine

## 2020-10-15 DIAGNOSIS — Z23 Encounter for immunization: Secondary | ICD-10-CM | POA: Diagnosis not present

## 2020-10-15 NOTE — Telephone Encounter (Signed)
   Jessica [name] DOB: [birth year] MRN: [id number]   RIDER WAIVER AND RELEASE OF LIABILITY  For purposes of improving physical access to our facilities, [facility] is pleased to partner with third parties to provide [facility] patients or other authorized individuals the option of convenient, on-demand ground transportation services (the [place]") through use of the technology service that enables users to request on-demand ground transportation from independent third-party providers.  By opting to use and accept these [employer], I, the undersigned, hereby agree on behalf of myself, and on behalf of any minor child using the [employer] for whom I am the parent or legal guardian, as follows:  1. Government social research officer provided to me are provided by independent third-party transportation providers who are not [employer] or employees and who are unaffiliated with [employer]. 2. [place] is neither a transportation carrier nor a common or public carrier. 3. [facility] has no control over the quality or safety of the transportation that occurs as a result of the [employer]. 4. [facility] cannot guarantee that any third-party transportation provider will complete any arranged transportation service. 5. [facility] makes no representation, warranty, or guarantee regarding the reliability, timeliness, quality, safety, suitability, or availability of any of the Transport Services or that they will be error free. 6. I fully understand that traveling by vehicle involves risks and dangers of serious bodily injury, including permanent disability, paralysis, and death. I agree, on behalf of myself and on behalf of any minor child using the Transport Services for whom I am the parent or legal guardian, that the entire risk arising out of my use of the [employer] remains solely with me, to the maximum extent permitted under applicable law. 7. The [employer] are provided "as is" and "as available." [facility] disclaims all representations and warranties, express, implied or statutory, not expressly set out in these terms, including the implied warranties of merchantability and fitness for a particular purpose. 8. I hereby waive and release [facility], its agents, employees, officers, directors, representatives, insurers, attorneys, assigns, successors, subsidiaries, and affiliates from any and all past, present, or future claims, demands, liabilities, actions, causes of action, or suits of any kind directly or indirectly arising from acceptance and use of the [employer]. 9. I further waive and release [facility] and its affiliates from all present and future liability and responsibility for any injury or death to persons or damages to property caused by or related to the use of the [employer]. 10. I have read this Waiver and Release of Liability, and I understand the terms used in it and their legal significance. This Waiver is freely and voluntarily given with the understanding that my right (as well as the right of any minor child for whom I am the parent or legal guardian using the [employer]) to legal recourse against [place] in connection with the [employer] is knowingly surrendered in return for use of these services.   I attest that I read the consent document to Jessica [name], gave Ms. Lewis the opportunity to ask questions and answered the questions asked (if any). I affirm that Jessica [name] then provided consent for she's participation in this program.     Jessica [name]

## 2020-10-21 DIAGNOSIS — M25561 Pain in right knee: Secondary | ICD-10-CM | POA: Diagnosis not present

## 2020-10-21 DIAGNOSIS — I5030 Unspecified diastolic (congestive) heart failure: Secondary | ICD-10-CM | POA: Diagnosis not present

## 2020-10-21 DIAGNOSIS — E1140 Type 2 diabetes mellitus with diabetic neuropathy, unspecified: Secondary | ICD-10-CM | POA: Diagnosis not present

## 2020-10-21 DIAGNOSIS — M25551 Pain in right hip: Secondary | ICD-10-CM | POA: Diagnosis not present

## 2020-10-21 DIAGNOSIS — M47816 Spondylosis without myelopathy or radiculopathy, lumbar region: Secondary | ICD-10-CM | POA: Diagnosis not present

## 2020-10-21 DIAGNOSIS — I110 Hypertensive heart disease with heart failure: Secondary | ICD-10-CM | POA: Diagnosis not present

## 2020-10-24 DIAGNOSIS — I5030 Unspecified diastolic (congestive) heart failure: Secondary | ICD-10-CM | POA: Diagnosis not present

## 2020-10-24 DIAGNOSIS — I110 Hypertensive heart disease with heart failure: Secondary | ICD-10-CM | POA: Diagnosis not present

## 2020-10-24 DIAGNOSIS — E1140 Type 2 diabetes mellitus with diabetic neuropathy, unspecified: Secondary | ICD-10-CM | POA: Diagnosis not present

## 2020-10-24 DIAGNOSIS — M25551 Pain in right hip: Secondary | ICD-10-CM | POA: Diagnosis not present

## 2020-10-24 DIAGNOSIS — M25561 Pain in right knee: Secondary | ICD-10-CM | POA: Diagnosis not present

## 2020-10-24 DIAGNOSIS — M47816 Spondylosis without myelopathy or radiculopathy, lumbar region: Secondary | ICD-10-CM | POA: Diagnosis not present

## 2020-10-29 DIAGNOSIS — M47816 Spondylosis without myelopathy or radiculopathy, lumbar region: Secondary | ICD-10-CM | POA: Diagnosis not present

## 2020-10-29 DIAGNOSIS — Z7984 Long term (current) use of oral hypoglycemic drugs: Secondary | ICD-10-CM | POA: Diagnosis not present

## 2020-10-29 DIAGNOSIS — E7800 Pure hypercholesterolemia, unspecified: Secondary | ICD-10-CM | POA: Diagnosis not present

## 2020-10-29 DIAGNOSIS — D509 Iron deficiency anemia, unspecified: Secondary | ICD-10-CM | POA: Diagnosis not present

## 2020-10-29 DIAGNOSIS — H409 Unspecified glaucoma: Secondary | ICD-10-CM | POA: Diagnosis not present

## 2020-10-29 DIAGNOSIS — E1140 Type 2 diabetes mellitus with diabetic neuropathy, unspecified: Secondary | ICD-10-CM | POA: Diagnosis not present

## 2020-10-29 DIAGNOSIS — N3946 Mixed incontinence: Secondary | ICD-10-CM | POA: Diagnosis not present

## 2020-10-29 DIAGNOSIS — I700 Atherosclerosis of aorta: Secondary | ICD-10-CM | POA: Diagnosis not present

## 2020-10-29 DIAGNOSIS — F32A Depression, unspecified: Secondary | ICD-10-CM | POA: Diagnosis not present

## 2020-10-29 DIAGNOSIS — Z9981 Dependence on supplemental oxygen: Secondary | ICD-10-CM | POA: Diagnosis not present

## 2020-10-29 DIAGNOSIS — H3530 Unspecified macular degeneration: Secondary | ICD-10-CM | POA: Diagnosis not present

## 2020-10-29 DIAGNOSIS — I110 Hypertensive heart disease with heart failure: Secondary | ICD-10-CM | POA: Diagnosis not present

## 2020-10-29 DIAGNOSIS — M25551 Pain in right hip: Secondary | ICD-10-CM | POA: Diagnosis not present

## 2020-10-29 DIAGNOSIS — R42 Dizziness and giddiness: Secondary | ICD-10-CM | POA: Diagnosis not present

## 2020-10-29 DIAGNOSIS — I5030 Unspecified diastolic (congestive) heart failure: Secondary | ICD-10-CM | POA: Diagnosis not present

## 2020-10-29 DIAGNOSIS — G4730 Sleep apnea, unspecified: Secondary | ICD-10-CM | POA: Diagnosis not present

## 2020-10-29 DIAGNOSIS — Z6841 Body Mass Index (BMI) 40.0 and over, adult: Secondary | ICD-10-CM | POA: Diagnosis not present

## 2020-10-29 DIAGNOSIS — F419 Anxiety disorder, unspecified: Secondary | ICD-10-CM | POA: Diagnosis not present

## 2020-10-29 DIAGNOSIS — Z96651 Presence of right artificial knee joint: Secondary | ICD-10-CM | POA: Diagnosis not present

## 2020-10-29 DIAGNOSIS — Z9641 Presence of insulin pump (external) (internal): Secondary | ICD-10-CM | POA: Diagnosis not present

## 2020-10-29 DIAGNOSIS — E782 Mixed hyperlipidemia: Secondary | ICD-10-CM | POA: Diagnosis not present

## 2020-10-29 DIAGNOSIS — Z9181 History of falling: Secondary | ICD-10-CM | POA: Diagnosis not present

## 2020-10-29 DIAGNOSIS — M25561 Pain in right knee: Secondary | ICD-10-CM | POA: Diagnosis not present

## 2020-11-03 ENCOUNTER — Other Ambulatory Visit: Payer: Self-pay

## 2020-11-03 DIAGNOSIS — I5032 Chronic diastolic (congestive) heart failure: Secondary | ICD-10-CM

## 2020-11-03 MED ORDER — FUROSEMIDE 20 MG PO TABS: 20.0000 mg | ORAL_TABLET | Freq: Every day | ORAL | 3 refills | Status: DC

## 2020-11-05 DIAGNOSIS — M47816 Spondylosis without myelopathy or radiculopathy, lumbar region: Secondary | ICD-10-CM | POA: Diagnosis not present

## 2020-11-05 DIAGNOSIS — I5030 Unspecified diastolic (congestive) heart failure: Secondary | ICD-10-CM | POA: Diagnosis not present

## 2020-11-05 DIAGNOSIS — I110 Hypertensive heart disease with heart failure: Secondary | ICD-10-CM | POA: Diagnosis not present

## 2020-11-05 DIAGNOSIS — E1140 Type 2 diabetes mellitus with diabetic neuropathy, unspecified: Secondary | ICD-10-CM | POA: Diagnosis not present

## 2020-11-05 DIAGNOSIS — M25561 Pain in right knee: Secondary | ICD-10-CM | POA: Diagnosis not present

## 2020-11-05 DIAGNOSIS — M25551 Pain in right hip: Secondary | ICD-10-CM | POA: Diagnosis not present

## 2020-11-12 DIAGNOSIS — I5030 Unspecified diastolic (congestive) heart failure: Secondary | ICD-10-CM | POA: Diagnosis not present

## 2020-11-12 DIAGNOSIS — E1140 Type 2 diabetes mellitus with diabetic neuropathy, unspecified: Secondary | ICD-10-CM | POA: Diagnosis not present

## 2020-11-12 DIAGNOSIS — I110 Hypertensive heart disease with heart failure: Secondary | ICD-10-CM | POA: Diagnosis not present

## 2020-11-12 DIAGNOSIS — M25551 Pain in right hip: Secondary | ICD-10-CM | POA: Diagnosis not present

## 2020-11-12 DIAGNOSIS — M25561 Pain in right knee: Secondary | ICD-10-CM | POA: Diagnosis not present

## 2020-11-12 DIAGNOSIS — M47816 Spondylosis without myelopathy or radiculopathy, lumbar region: Secondary | ICD-10-CM | POA: Diagnosis not present

## 2020-11-19 DIAGNOSIS — M47816 Spondylosis without myelopathy or radiculopathy, lumbar region: Secondary | ICD-10-CM | POA: Diagnosis not present

## 2020-11-19 DIAGNOSIS — M25561 Pain in right knee: Secondary | ICD-10-CM | POA: Diagnosis not present

## 2020-11-19 DIAGNOSIS — E1140 Type 2 diabetes mellitus with diabetic neuropathy, unspecified: Secondary | ICD-10-CM | POA: Diagnosis not present

## 2020-11-19 DIAGNOSIS — I5030 Unspecified diastolic (congestive) heart failure: Secondary | ICD-10-CM | POA: Diagnosis not present

## 2020-11-19 DIAGNOSIS — M25551 Pain in right hip: Secondary | ICD-10-CM | POA: Diagnosis not present

## 2020-11-19 DIAGNOSIS — I110 Hypertensive heart disease with heart failure: Secondary | ICD-10-CM | POA: Diagnosis not present

## 2020-11-20 DIAGNOSIS — Z9641 Presence of insulin pump (external) (internal): Secondary | ICD-10-CM | POA: Diagnosis not present

## 2020-11-20 DIAGNOSIS — I10 Essential (primary) hypertension: Secondary | ICD-10-CM | POA: Diagnosis not present

## 2020-11-20 DIAGNOSIS — G609 Hereditary and idiopathic neuropathy, unspecified: Secondary | ICD-10-CM | POA: Diagnosis not present

## 2020-11-20 DIAGNOSIS — E7800 Pure hypercholesterolemia, unspecified: Secondary | ICD-10-CM | POA: Diagnosis not present

## 2020-11-20 DIAGNOSIS — E1165 Type 2 diabetes mellitus with hyperglycemia: Secondary | ICD-10-CM | POA: Diagnosis not present

## 2020-11-20 DIAGNOSIS — E669 Obesity, unspecified: Secondary | ICD-10-CM | POA: Diagnosis not present

## 2020-11-22 ENCOUNTER — Other Ambulatory Visit: Payer: Self-pay | Admitting: Cardiology

## 2020-11-22 DIAGNOSIS — I5032 Chronic diastolic (congestive) heart failure: Secondary | ICD-10-CM

## 2020-11-24 DIAGNOSIS — I110 Hypertensive heart disease with heart failure: Secondary | ICD-10-CM | POA: Diagnosis not present

## 2020-11-24 DIAGNOSIS — M47816 Spondylosis without myelopathy or radiculopathy, lumbar region: Secondary | ICD-10-CM | POA: Diagnosis not present

## 2020-11-24 DIAGNOSIS — E1140 Type 2 diabetes mellitus with diabetic neuropathy, unspecified: Secondary | ICD-10-CM | POA: Diagnosis not present

## 2020-11-24 DIAGNOSIS — M25561 Pain in right knee: Secondary | ICD-10-CM | POA: Diagnosis not present

## 2020-11-24 DIAGNOSIS — M25551 Pain in right hip: Secondary | ICD-10-CM | POA: Diagnosis not present

## 2020-11-24 DIAGNOSIS — I5030 Unspecified diastolic (congestive) heart failure: Secondary | ICD-10-CM | POA: Diagnosis not present

## 2020-12-04 DIAGNOSIS — H401211 Low-tension glaucoma, right eye, mild stage: Secondary | ICD-10-CM | POA: Diagnosis not present

## 2020-12-04 DIAGNOSIS — H401222 Low-tension glaucoma, left eye, moderate stage: Secondary | ICD-10-CM | POA: Diagnosis not present

## 2020-12-04 DIAGNOSIS — E119 Type 2 diabetes mellitus without complications: Secondary | ICD-10-CM | POA: Diagnosis not present

## 2020-12-04 DIAGNOSIS — H2513 Age-related nuclear cataract, bilateral: Secondary | ICD-10-CM | POA: Diagnosis not present

## 2020-12-09 ENCOUNTER — Ambulatory Visit: Payer: Medicare Other | Attending: Family Medicine | Admitting: Physical Therapy

## 2020-12-09 ENCOUNTER — Encounter: Payer: Self-pay | Admitting: Physical Therapy

## 2020-12-09 ENCOUNTER — Other Ambulatory Visit: Payer: Self-pay

## 2020-12-09 DIAGNOSIS — G8929 Other chronic pain: Secondary | ICD-10-CM | POA: Diagnosis not present

## 2020-12-09 DIAGNOSIS — M545 Low back pain, unspecified: Secondary | ICD-10-CM | POA: Diagnosis not present

## 2020-12-09 DIAGNOSIS — R262 Difficulty in walking, not elsewhere classified: Secondary | ICD-10-CM | POA: Diagnosis not present

## 2020-12-09 DIAGNOSIS — M25561 Pain in right knee: Secondary | ICD-10-CM | POA: Insufficient documentation

## 2020-12-09 DIAGNOSIS — R42 Dizziness and giddiness: Secondary | ICD-10-CM | POA: Diagnosis not present

## 2020-12-09 DIAGNOSIS — M6281 Muscle weakness (generalized): Secondary | ICD-10-CM | POA: Diagnosis not present

## 2020-12-09 NOTE — Therapy (Signed)
[place] [place], [place], [address] Phone: [phone]   Fax:  [phone]  Physical Therapy Evaluation  Patient Details  Name: Jessica [name] MRN: [id number] Date of Birth: 10/14/1946 Referring Provider (PT): Addison Lank   Encounter Date: 12/09/2020   PT End of Session - 12/09/20 1608    Visit Number 1    Date for PT Re-Evaluation 02/06/21    PT Start Time 1525    PT Stop Time 1606    PT Time Calculation (min) 41 min    Activity Tolerance Patient tolerated treatment well    Behavior During Therapy [facility] for tasks assessed/performed           Past Medical History:  Diagnosis Date  . Anemia   . Anxiety   . Arthritis   . Cataract   . Depression   . Diabetes mellitus without complication ([place])   . Diastolic dysfunction   . Frozen shoulder   . Glaucoma   . Headache    History of migraines  . Heart murmur   . History of kidney stones   . Hyperlipidemia   . Hypertension   . Macular degeneration   . Neuropathy   . Obesity   . Sleep apnea   . Stroke [facility])    has been told by a Dr. Mort Sawyers that she may have had a mini stroke but no further testing was done to confirm  . Tachycardia     Past Surgical History:  Procedure Laterality Date  . CARDIAC CATHETERIZATION     -6/09-normal ejection fraction 65%, no angiographically significant CAD., M. Skains MD  . CESAREAN SECTION    . COLONOSCOPY    . foot spurs removed    . HYSTEROSCOPY WITH D & C N/A 06/28/2017   Procedure: DILATATION AND CURETTAGE /HYSTEROSCOPY/POLYPECTOMY WITH MYOSURE;  Surgeon: Christophe Louis, MD;  Location: [facility];  Service: Gynecology;  Laterality: N/A;  . JOINT REPLACEMENT     right knee replaced  . knee spurs removed    . ORIF FEMUR FRACTURE Right 04/19/2019   Procedure: OPEN REDUCTION INTERNAL FIXATION (ORIF) DISTAL FEMUR FRACTURE;  Surgeon: Rod Can, MD;  Location: [facility];  Service: Orthopedics;  Laterality: Right;  .  shoulder spurs removed    . TONSILLECTOMY AND ADENOIDECTOMY    . UPPER GI ENDOSCOPY      There were no vitals filed for this visit.    Subjective Assessment - 12/09/20 1523    Subjective Patient has an extensive history since 2020, she had a fracture of the right femur, and fracture of the left tibia and ankle.  She reports that she was in and out of nursing homes, reports that she was non weight bearing for a long time, she reports that in October 2021 she had dizziness and fell again, but no fractures.  She was in a nursing home until Thanksgiving 2021.  She has been home since that time with nursing, social work and PT and has used transportation services.  She reports that she has has right knee and hip paind and bakc pain but also balance issues    Pertinent History stroke anemia, anxiety, depression, DM among others    How long can you stand comfortably? 5-10 minutes    How long can you walk comfortably? 60 feet with rollator    Patient Stated Goals walk with cane, feel stronger and more steady    Currently in Pain? Yes    Pain  Score 3     Pain Location Knee   right hip and back   Pain Orientation Right    Pain Descriptors / Indicators Aching    Pain Type Chronic pain    Pain Onset More than a month ago    Pain Frequency Constant    Aggravating Factors  standing and walking, bending all will increase her pain up to 8-9/10    Pain Relieving Factors rub a cream on the leg and back helps at best pain a 3/10    Effect of Pain on Daily Activities reports limited with all ADL's              [facility] PT Assessment - 12/09/20 0001      Assessment   Medical Diagnosis right knee, back pain , weakness and difficulty walking    Referring Provider (PT) Addison Lank    Onset Date/Surgical Date 11/08/20    Hand Dominance Right    Prior Therapy >2-3 years ago      Precautions   Precautions None      Balance Screen   Has the patient fallen in the past 6 months Yes    How many times? 4     Has the patient had a decrease in activity level because of a fear of falling?  Yes    Is the patient reluctant to leave their home because of a fear of falling?  Yes      [place] residence    Additional Comments has a ramp, does some cooking and cleaning, has had help with cleaning      Prior Function   Level of Independence Independent with household mobility with device    Vocation Retired      ROM / Strength   AROM / PROM / Strength AROM;Strength      AROM   Overall AROM Comments left ankle, knee and hip WFL's      Strength   Strength Assessment Site Hip;Knee;Ankle    Right/Left Hip Right;Left    Right Hip Flexion 3+/5    Right Hip Extension 3+/5    Right Hip ABduction 3+/5    Left Hip Flexion 4/5    Left Hip Extension 3+/5    Left Hip ABduction 3+/5    Right/Left Knee Right;Left    Right Knee Flexion 3+/5    Right Knee Extension 3+/5    Left Knee Flexion 4-/5    Left Knee Extension 3+/5    Right/Left Ankle Right;Left    Right Ankle Dorsiflexion 3+/5    Right Ankle Plantar Flexion 3+/5    Left Ankle Dorsiflexion 3+/5    Left Ankle Plantar Flexion 3+/5      Palpation   Palpation comment she is tender in the right lateral thigh, right low back, spasms in the lumbar area, has pain in the right lateral knee, some tenderness here      Ambulation/Gait   Gait Comments uses a 4WW, slow gait, antalgic on the right      Standardized Balance Assessment   Standardized Balance Assessment Timed Up and Go Test;Berg Balance Test      Berg Balance Test   Sit to Stand Able to stand  independently using hands    Standing Unsupported Able to stand 2 minutes with supervision    Sitting with Back Unsupported but Feet Supported on Floor or Stool Able to sit safely and securely 2 minutes    Stand to Sit Controls descent by  using hands    Transfers Able to transfer safely, definite need of hands    Standing Unsupported with Eyes Closed Able to  stand 10 seconds with supervision    Standing Unsupported with Feet Together Able to place feet together independently but unable to hold for 30 seconds    From Standing, Reach Forward with Outstretched Arm Can reach forward >12 cm safely (5")    From Standing Position, Pick up Object from Floor Unable to try/needs assist to keep balance    From Standing Position, Turn to Look Behind Over each Shoulder Turn sideways only but maintains balance    Turn 360 Degrees Needs close supervision or verbal cueing    Standing Unsupported, Alternately Place Feet on Step/Stool Needs assistance to keep from falling or unable to try    Standing Unsupported, One Foot in [employer] balance while stepping or standing    Standing on One Leg Unable to try or needs assist to prevent fall    Total Score 27      Timed Up and Go Test   Normal TUG (seconds) 28    TUG Comments with rollator                      Objective measurements completed on examination: See above findings.                 PT Short Term Goals - 12/09/20 1712      PT SHORT TERM GOAL #1   Title independent with intial HEP    Time 2    Period Weeks             PT Long Term Goals - 12/09/20 1712      PT LONG TERM GOAL #1   Title increase LE strength to 4+/5    Time 12    Period Weeks    Status New      PT LONG TERM GOAL #2   Title decrease back and knee pain 25%    Time 12    Period Weeks    Status New      PT LONG TERM GOAL #3   Title decrease TUG time to 14 seconds    Time 12    Period Weeks    Status New      PT LONG TERM GOAL #4   Title increase BERG balance test to 45/56    Time 12    Period Weeks    Status New      PT LONG TERM GOAL #5   Title walk 300 feet with SPC    Time 12    Period Weeks    Status New                  Plan - 12/09/20 1703    Clinical Impression Statement Patient reports a history starting in 2020 that she had a fall and sustained a fracture of the  right femur and left tibia and ankle, she had ORIF.  She reports staying in a nursing center, she reports that in October of 2021 she had onset of dizzines and fell again.  She sustained no injuries with this fall, however she spent 4-6 weeks in a nursing center and then has been having care at home.  She reports that she feels unsteady with walking, she feels weak, has pain in the right knee and hip and the low back.  Reports difficulty with all ADL's.  Her TUG  was 28 seconds, her Berg balance test was 27/56, this puts her at a very high risk for falls.    Personal Factors and Comorbidities Comorbidity 3+    Comorbidities anxiety, depression, DM, anemia, obesity, CVA, RC repair, TKA, ORIF of the right femur and left tibia    Examination-Activity Limitations Bend;Squat;Carry;Stairs;Stand;Lift;Transfers;Locomotion Level    Examination-Participation Restrictions Meal Prep;Cleaning;Community Activity;Shop;Driving;Laundry    Stability/Clinical Decision Making Evolving/Moderate complexity    Clinical Decision Making Moderate    Rehab Potential Good    PT Frequency 2x / week    PT Duration 8 weeks    PT Treatment/Interventions ADLs/Self Care Home Management;Cryotherapy;Electrical Stimulation;Moist Heat;Gait training;Neuromuscular re-education;Balance training;Therapeutic exercise;Therapeutic activities;Functional mobility training;Stair training;Patient/family education;Manual techniques    PT Next Visit Plan slowly start strength and balance for increased safety    Consulted and Agree with Plan of Care Patient           Patient will benefit from skilled therapeutic intervention in order to improve the following deficits and impairments:  Abnormal gait,Decreased range of motion,Difficulty walking,Cardiopulmonary status limiting activity,Decreased endurance,Decreased activity tolerance,Pain,Improper body mechanics,Decreased balance,Decreased mobility,Decreased strength,Postural dysfunction  Visit  Diagnosis: Muscle weakness (generalized) - Plan: PT plan of care cert/re-cert  Chronic pain of right knee - Plan: PT plan of care cert/re-cert  Chronic bilateral low back pain without sciatica - Plan: PT plan of care cert/re-cert  Difficulty in walking, not elsewhere classified - Plan: PT plan of care cert/re-cert  Dizziness and giddiness - Plan: PT plan of care cert/re-cert     Problem List Patient Active Problem List   Diagnosis Date Noted  . Hyperlipidemia   . Diarrhea   . Scar 06/19/2019  . Vaginal yeast infection 05/26/2019  . At risk for adverse drug event 05/02/2019  . CHF (congestive heart failure) ([place]) 05/01/2019  . Pressure ulcer of sacral region, stage 2 ([place]) 05/01/2019  . Displaced supracondylar fracture of distal end of right femur without intracondylar extension ([place]) 04/19/2019  . Closed right femoral fracture ([place]) 04/17/2019  . Sleep apnea   . Glaucoma   . Essential hypertension, benign 09/03/2014  . Morbid obesity ([place]) 03/06/2014  . HTN (hypertension) 03/06/2014  . Vertigo 03/06/2014  . Type 2 diabetes mellitus ([place]) 03/06/2014  . Pure hypercholesterolemia 03/06/2014    Sumner Boast., PT 12/09/2020, 5:17 PM  [place]. [place], [place], [address] Phone: [phone]   Fax:  [phone]  Name: Jessica [name] MRN: [id number] Date of Birth: Nov 12, 1945

## 2020-12-12 ENCOUNTER — Ambulatory Visit: Payer: Medicare Other | Admitting: Physical Therapy

## 2020-12-15 DIAGNOSIS — N3001 Acute cystitis with hematuria: Secondary | ICD-10-CM | POA: Diagnosis not present

## 2020-12-15 DIAGNOSIS — Z20822 Contact with and (suspected) exposure to covid-19: Secondary | ICD-10-CM | POA: Diagnosis not present

## 2020-12-15 DIAGNOSIS — R197 Diarrhea, unspecified: Secondary | ICD-10-CM | POA: Diagnosis not present

## 2020-12-15 DIAGNOSIS — J029 Acute pharyngitis, unspecified: Secondary | ICD-10-CM | POA: Diagnosis not present

## 2020-12-15 DIAGNOSIS — R300 Dysuria: Secondary | ICD-10-CM | POA: Diagnosis not present

## 2020-12-16 ENCOUNTER — Ambulatory Visit: Payer: Medicare Other | Admitting: Physical Therapy

## 2020-12-18 ENCOUNTER — Ambulatory Visit: Payer: Medicare Other | Admitting: Physical Therapy

## 2020-12-23 ENCOUNTER — Ambulatory Visit: Payer: Medicare Other | Attending: Family Medicine | Admitting: Physical Therapy

## 2020-12-23 ENCOUNTER — Encounter: Payer: Self-pay | Admitting: Physical Therapy

## 2020-12-23 ENCOUNTER — Other Ambulatory Visit: Payer: Self-pay

## 2020-12-23 DIAGNOSIS — M545 Low back pain, unspecified: Secondary | ICD-10-CM | POA: Diagnosis not present

## 2020-12-23 DIAGNOSIS — R262 Difficulty in walking, not elsewhere classified: Secondary | ICD-10-CM | POA: Diagnosis not present

## 2020-12-23 DIAGNOSIS — G8929 Other chronic pain: Secondary | ICD-10-CM | POA: Insufficient documentation

## 2020-12-23 DIAGNOSIS — M25561 Pain in right knee: Secondary | ICD-10-CM | POA: Diagnosis not present

## 2020-12-23 DIAGNOSIS — R42 Dizziness and giddiness: Secondary | ICD-10-CM | POA: Diagnosis not present

## 2020-12-23 DIAGNOSIS — M6281 Muscle weakness (generalized): Secondary | ICD-10-CM | POA: Insufficient documentation

## 2020-12-23 NOTE — Therapy (Signed)
[place]. [place], [place], [address] Phone: [phone]   Fax:  [phone]  Physical Therapy Treatment  Patient Details  Name: Jessica [name] MRN: [id number] Date of Birth: 19-Dec-1945 Referring Provider (PT): Addison Lank   Encounter Date: 12/23/2020   PT End of Session - 12/23/20 1014    Visit Number 2    Date for PT Re-Evaluation 02/06/21    PT Start Time 0940    PT Stop Time 1015    PT Time Calculation (min) 35 min    Activity Tolerance Patient tolerated treatment well    Behavior During Therapy [facility] for tasks assessed/performed           Past Medical History:  Diagnosis Date  . Anemia   . Anxiety   . Arthritis   . Cataract   . Depression   . Diabetes mellitus without complication ([place])   . Diastolic dysfunction   . Frozen shoulder   . Glaucoma   . Headache    History of migraines  . Heart murmur   . History of kidney stones   . Hyperlipidemia   . Hypertension   . Macular degeneration   . Neuropathy   . Obesity   . Sleep apnea   . Stroke [facility])    has been told by a Dr. Mort Sawyers that she may have had a mini stroke but no further testing was done to confirm  . Tachycardia     Past Surgical History:  Procedure Laterality Date  . CARDIAC CATHETERIZATION     -6/09-normal ejection fraction 65%, no angiographically significant CAD., M. Skains MD  . CESAREAN SECTION    . COLONOSCOPY    . foot spurs removed    . HYSTEROSCOPY WITH D & C N/A 06/28/2017   Procedure: DILATATION AND CURETTAGE /HYSTEROSCOPY/POLYPECTOMY WITH MYOSURE;  Surgeon: Christophe Louis, MD;  Location: [facility];  Service: Gynecology;  Laterality: N/A;  . JOINT REPLACEMENT     right knee replaced  . knee spurs removed    . ORIF FEMUR FRACTURE Right 04/19/2019   Procedure: OPEN REDUCTION INTERNAL FIXATION (ORIF) DISTAL FEMUR FRACTURE;  Surgeon: Rod Can, MD;  Location: [facility];  Service: Orthopedics;  Laterality: Right;  .  shoulder spurs removed    . TONSILLECTOMY AND ADENOIDECTOMY    . UPPER GI ENDOSCOPY      There were no vitals filed for this visit.   Subjective Assessment - 12/23/20 0944    Subjective Pt reports that she is feeling a little better. Had a bladder infection last week. Pt states no pain today.    Currently in Pain? No/denies    Pain Score 0-No pain                             [facility] Adult PT Treatment/Exercise - 12/23/20 0001      Ambulation/Gait   Gait Comments gait with 4WW x200 ft around clinic with cues for upright standing and increased foot clearance      Exercises   Exercises Knee/Hip      Knee/Hip Exercises: Aerobic   Nustep L5 x 6 min      Knee/Hip Exercises: Seated   Long Arc Quad Both;2 sets;10 reps    Long Arc Quad Weight 2 lbs.    Marching Both;2 sets;10 reps    Marching Weights 2 lbs.    Hamstring Curl Both;2 sets;10 reps    Hamstring  Limitations red tb    Sit to Sand 3 sets;5 reps;without UE support   last set with 2# chest press                   PT Short Term Goals - 12/09/20 1712      PT SHORT TERM GOAL #1   Title independent with intial HEP    Time 2    Period Weeks             PT Long Term Goals - 12/09/20 1712      PT LONG TERM GOAL #1   Title increase LE strength to 4+/5    Time 12    Period Weeks    Status New      PT LONG TERM GOAL #2   Title decrease back and knee pain 25%    Time 12    Period Weeks    Status New      PT LONG TERM GOAL #3   Title decrease TUG time to 14 seconds    Time 12    Period Weeks    Status New      PT LONG TERM GOAL #4   Title increase BERG balance test to 45/56    Time 12    Period Weeks    Status New      PT LONG TERM GOAL #5   Title walk 300 feet with SPC    Time 12    Period Weeks    Status New                 Plan - 12/23/20 1015    Clinical Impression Statement Pt arrived ~10 min late this rx. Tolerated progression to TE well. Cues for increased  foot clearance with ambulation around clinic and for upright standing. STS with no UE assist from chair sitting on airex; cues to avoid utilizing LEs on back of chair. Pt reports would like to try gait with SPC next rx. Continue functional strengthening and initiate balance ex's along with gait training.    PT Treatment/Interventions ADLs/Self Care Home Management;Cryotherapy;Electrical Stimulation;Moist Heat;Gait training;Neuromuscular re-education;Balance training;Therapeutic exercise;Therapeutic activities;Functional mobility training;Stair training;Patient/family education;Manual techniques    PT Next Visit Plan slowly start strength and balance for increased safety    Consulted and Agree with Plan of Care Patient           Patient will benefit from skilled therapeutic intervention in order to improve the following deficits and impairments:  Abnormal gait,Decreased range of motion,Difficulty walking,Cardiopulmonary status limiting activity,Decreased endurance,Decreased activity tolerance,Pain,Improper body mechanics,Decreased balance,Decreased mobility,Decreased strength,Postural dysfunction  Visit Diagnosis: Muscle weakness (generalized)  Chronic pain of right knee  Chronic bilateral low back pain without sciatica  Difficulty in walking, not elsewhere classified     Problem List Patient Active Problem List   Diagnosis Date Noted  . Hyperlipidemia   . Diarrhea   . Scar 06/19/2019  . Vaginal yeast infection 05/26/2019  . At risk for adverse drug event 05/02/2019  . CHF (congestive heart failure) ([place]) 05/01/2019  . Pressure ulcer of sacral region, stage 2 ([place]) 05/01/2019  . Displaced supracondylar fracture of distal end of right femur without intracondylar extension ([place]) 04/19/2019  . Closed right femoral fracture ([place]) 04/17/2019  . Sleep apnea   . Glaucoma   . Essential hypertension, benign 09/03/2014  . Morbid obesity ([place]) 03/06/2014  . HTN (hypertension) 03/06/2014   . Vertigo 03/06/2014  . Type 2 diabetes mellitus ([place]) 03/06/2014  . Pure  hypercholesterolemia 03/06/2014   Amador Cunas, PT, DPT Donald Prose Kelvon Giannini 12/23/2020, 10:17 AM  [place]. [place], [place], [address] Phone: [phone]   Fax:  [phone]  Name: SHAWNEE GAMBONE MRN: [id number] Date of Birth: [birth year]

## 2020-12-25 ENCOUNTER — Encounter: Payer: Self-pay | Admitting: Physical Therapy

## 2020-12-25 ENCOUNTER — Other Ambulatory Visit: Payer: Self-pay

## 2020-12-25 ENCOUNTER — Ambulatory Visit: Payer: Medicare Other | Admitting: Physical Therapy

## 2020-12-25 DIAGNOSIS — M545 Low back pain, unspecified: Secondary | ICD-10-CM

## 2020-12-25 DIAGNOSIS — M25561 Pain in right knee: Secondary | ICD-10-CM | POA: Diagnosis not present

## 2020-12-25 DIAGNOSIS — R42 Dizziness and giddiness: Secondary | ICD-10-CM | POA: Diagnosis not present

## 2020-12-25 DIAGNOSIS — G8929 Other chronic pain: Secondary | ICD-10-CM

## 2020-12-25 DIAGNOSIS — R262 Difficulty in walking, not elsewhere classified: Secondary | ICD-10-CM

## 2020-12-25 DIAGNOSIS — M6281 Muscle weakness (generalized): Secondary | ICD-10-CM | POA: Diagnosis not present

## 2020-12-25 NOTE — Therapy (Signed)
[place]. [place], [place], [address] Phone: [phone]   Fax:  [phone]  Physical Therapy Treatment  Patient Details  Name: Jessica [name] MRN: [id number] Date of Birth: 11-22-1945 Referring Provider (PT): Addison Lank   Encounter Date: 12/25/2020   PT End of Session - 12/25/20 1006    Visit Number 3    Date for PT Re-Evaluation 02/06/21    PT Start Time 0930    PT Stop Time 1010    PT Time Calculation (min) 40 min    Activity Tolerance Patient tolerated treatment well    Behavior During Therapy [facility] for tasks assessed/performed           Past Medical History:  Diagnosis Date  . Anemia   . Anxiety   . Arthritis   . Cataract   . Depression   . Diabetes mellitus without complication ([place])   . Diastolic dysfunction   . Frozen shoulder   . Glaucoma   . Headache    History of migraines  . Heart murmur   . History of kidney stones   . Hyperlipidemia   . Hypertension   . Macular degeneration   . Neuropathy   . Obesity   . Sleep apnea   . Stroke [facility])    has been told by a Dr. Mort Sawyers that she may have had a mini stroke but no further testing was done to confirm  . Tachycardia     Past Surgical History:  Procedure Laterality Date  . CARDIAC CATHETERIZATION     -6/09-normal ejection fraction 65%, no angiographically significant CAD., M. Skains MD  . CESAREAN SECTION    . COLONOSCOPY    . foot spurs removed    . HYSTEROSCOPY WITH D & C N/A 06/28/2017   Procedure: DILATATION AND CURETTAGE /HYSTEROSCOPY/POLYPECTOMY WITH MYOSURE;  Surgeon: Christophe Louis, MD;  Location: [facility];  Service: Gynecology;  Laterality: N/A;  . JOINT REPLACEMENT     right knee replaced  . knee spurs removed    . ORIF FEMUR FRACTURE Right 04/19/2019   Procedure: OPEN REDUCTION INTERNAL FIXATION (ORIF) DISTAL FEMUR FRACTURE;  Surgeon: Rod Can, MD;  Location: [facility];  Service: Orthopedics;  Laterality: Right;  .  shoulder spurs removed    . TONSILLECTOMY AND ADENOIDECTOMY    . UPPER GI ENDOSCOPY      There were no vitals filed for this visit.   Subjective Assessment - 12/25/20 0933    Subjective "Feeling pretty good, just tired"    Currently in Pain? No/denies    Pain Location --   quad   Pain Orientation Right                             [facility] Adult PT Treatment/Exercise - 12/25/20 0001      Ambulation/Gait   Gait Comments gait with 4WW x200 ft around clinic with cues for upright standing and increased foot clearance      Knee/Hip Exercises: Aerobic   Nustep L5 x 6 min      Knee/Hip Exercises: Standing   Other Standing Knee Exercises standing marches with rollator 2x10      Knee/Hip Exercises: Seated   Long Arc Quad Both;2 sets;10 reps    Long Arc Quad Weight 2 lbs.    Ball Squeeze 2x10    Marching Both;2 sets;10 reps    [employer] 2 lbs.    Hamstring Curl  Both;2 sets;10 reps    Hamstring Limitations red tb    Abduction/Adduction  Both;2 sets;10 reps    Abd/Adduction Limitations green tband    Sit to Sand 3 sets;5 reps;without UE support                    PT Short Term Goals - 12/09/20 1712      PT SHORT TERM GOAL #1   Title independent with intial HEP    Time 2    Period Weeks             PT Long Term Goals - 12/09/20 1712      PT LONG TERM GOAL #1   Title increase LE strength to 4+/5    Time 12    Period Weeks    Status New      PT LONG TERM GOAL #2   Title decrease back and knee pain 25%    Time 12    Period Weeks    Status New      PT LONG TERM GOAL #3   Title decrease TUG time to 14 seconds    Time 12    Period Weeks    Status New      PT LONG TERM GOAL #4   Title increase BERG balance test to 45/56    Time 12    Period Weeks    Status New      PT LONG TERM GOAL #5   Title walk 300 feet with SPC    Time 12    Period Weeks    Status New                 Plan - 12/25/20 1007    Clinical  Impression Statement Pt cant be hyper verbal needing cues to be redirected. Some RLE weakness noted compared to left. Some momentum needed today's with sit to stands. Cues needed to complete the full ROM with seated curls and LAQ.    Personal Factors and Comorbidities Comorbidity 3+    Comorbidities anxiety, depression, DM, anemia, obesity, CVA, RC repair, TKA, ORIF of the right femur and left tibia    Examination-Activity Limitations Bend;Squat;Carry;Stairs;Stand;Lift;Transfers;Locomotion Level    Examination-Participation Restrictions Meal Prep;Cleaning;Community Activity;Shop;Driving;Laundry    Stability/Clinical Decision Making Evolving/Moderate complexity    Rehab Potential Good    PT Frequency 2x / week    PT Duration 8 weeks    PT Treatment/Interventions ADLs/Self Care Home Management;Cryotherapy;Electrical Stimulation;Moist Heat;Gait training;Neuromuscular re-education;Balance training;Therapeutic exercise;Therapeutic activities;Functional mobility training;Stair training;Patient/family education;Manual techniques    PT Next Visit Plan slowly start strength and balance for increased safety           Patient will benefit from skilled therapeutic intervention in order to improve the following deficits and impairments:  Abnormal gait,Decreased range of motion,Difficulty walking,Cardiopulmonary status limiting activity,Decreased endurance,Decreased activity tolerance,Pain,Improper body mechanics,Decreased balance,Decreased mobility,Decreased strength,Postural dysfunction  Visit Diagnosis: Chronic pain of right knee  Chronic bilateral low back pain without sciatica  Dizziness and giddiness  Difficulty in walking, not elsewhere classified     Problem List Patient Active Problem List   Diagnosis Date Noted  . Hyperlipidemia   . Diarrhea   . Scar 06/19/2019  . Vaginal yeast infection 05/26/2019  . At risk for adverse drug event 05/02/2019  . CHF (congestive heart failure) ([place])  05/01/2019  . Pressure ulcer of sacral region, stage 2 ([place]) 05/01/2019  . Displaced supracondylar fracture of distal end of right femur without intracondylar extension ([place]) 04/19/2019  .  Closed right femoral fracture ([place]) 04/17/2019  . Sleep apnea   . Glaucoma   . Essential hypertension, benign 09/03/2014  . Morbid obesity ([place]) 03/06/2014  . HTN (hypertension) 03/06/2014  . Vertigo 03/06/2014  . Type 2 diabetes mellitus ([place]) 03/06/2014  . Pure hypercholesterolemia 03/06/2014    Scot Jun, PTA 12/25/2020, 10:11 AM  [place]. [place], [place], [address] Phone: [phone]   Fax:  [phone]  Name: Jessica [name] MRN: [id number] Date of Birth: 1946/07/19

## 2020-12-30 ENCOUNTER — Other Ambulatory Visit: Payer: Self-pay

## 2020-12-30 ENCOUNTER — Ambulatory Visit: Payer: Medicare Other | Admitting: Physical Therapy

## 2020-12-30 ENCOUNTER — Encounter: Payer: Self-pay | Admitting: Physical Therapy

## 2020-12-30 DIAGNOSIS — M545 Low back pain, unspecified: Secondary | ICD-10-CM | POA: Diagnosis not present

## 2020-12-30 DIAGNOSIS — M6281 Muscle weakness (generalized): Secondary | ICD-10-CM | POA: Diagnosis not present

## 2020-12-30 DIAGNOSIS — M25561 Pain in right knee: Secondary | ICD-10-CM | POA: Diagnosis not present

## 2020-12-30 DIAGNOSIS — R262 Difficulty in walking, not elsewhere classified: Secondary | ICD-10-CM | POA: Diagnosis not present

## 2020-12-30 DIAGNOSIS — R42 Dizziness and giddiness: Secondary | ICD-10-CM | POA: Diagnosis not present

## 2020-12-30 DIAGNOSIS — G8929 Other chronic pain: Secondary | ICD-10-CM | POA: Diagnosis not present

## 2020-12-30 NOTE — Therapy (Signed)
[place]. [place], [place], [address] Phone: [phone]   Fax:  [phone]  Physical Therapy Treatment  Patient Details  Name: Jessica [name] MRN: [id number] Date of Birth: 08/26/1946 Referring Provider (PT): Addison Lank   Encounter Date: 12/30/2020   PT End of Session - 12/30/20 1051    Visit Number 4    Date for PT Re-Evaluation 02/06/21    PT Start Time 0539    PT Stop Time 1055    PT Time Calculation (min) 40 min    Activity Tolerance Patient tolerated treatment well    Behavior During Therapy [facility] for tasks assessed/performed           Past Medical History:  Diagnosis Date  . Anemia   . Anxiety   . Arthritis   . Cataract   . Depression   . Diabetes mellitus without complication ([place])   . Diastolic dysfunction   . Frozen shoulder   . Glaucoma   . Headache    History of migraines  . Heart murmur   . History of kidney stones   . Hyperlipidemia   . Hypertension   . Macular degeneration   . Neuropathy   . Obesity   . Sleep apnea   . Stroke [facility])    has been told by a Dr. Mort Sawyers that she may have had a mini stroke but no further testing was done to confirm  . Tachycardia     Past Surgical History:  Procedure Laterality Date  . CARDIAC CATHETERIZATION     -6/09-normal ejection fraction 65%, no angiographically significant CAD., M. Skains MD  . CESAREAN SECTION    . COLONOSCOPY    . foot spurs removed    . HYSTEROSCOPY WITH D & C N/A 06/28/2017   Procedure: DILATATION AND CURETTAGE /HYSTEROSCOPY/POLYPECTOMY WITH MYOSURE;  Surgeon: Christophe Louis, MD;  Location: [facility];  Service: Gynecology;  Laterality: N/A;  . JOINT REPLACEMENT     right knee replaced  . knee spurs removed    . ORIF FEMUR FRACTURE Right 04/19/2019   Procedure: OPEN REDUCTION INTERNAL FIXATION (ORIF) DISTAL FEMUR FRACTURE;  Surgeon: Rod Can, MD;  Location: [facility];  Service: Orthopedics;  Laterality: Right;  .  shoulder spurs removed    . TONSILLECTOMY AND ADENOIDECTOMY    . UPPER GI ENDOSCOPY      There were no vitals filed for this visit.   Subjective Assessment - 12/30/20 1015    Subjective "Feeling ok"    Pertinent History stroke anemia, anxiety, depression, DM among others    How long can you stand comfortably? 5-10 minutes    How long can you walk comfortably? 60 feet with rollator    Currently in Pain? Yes    Pain Score 2     Pain Location Knee    Pain Orientation Right                             [facility] Adult PT Treatment/Exercise - 12/30/20 0001      Ambulation/Gait   Gait Comments gait with 4WW x200 ft around clinic with cues for upright standing and increased foot clearance      Knee/Hip Exercises: Aerobic   Nustep L5 x 6 min      Knee/Hip Exercises: Standing   Other Standing Knee Exercises standing marches with rollator 2x10      Knee/Hip Exercises: Seated   Long Arc  Quad Both;2 sets;10 reps    Long [employer] Weight 3 lbs.    Ball Squeeze 2x10    Marching Both;2 sets;10 reps    [employer] 3 lbs.    Hamstring Curl Both;2 sets;10 reps    Hamstring Limitations red tb    Abduction/Adduction  Both;2 sets;10 reps    Abd/Adduction Limitations green tband    Sit to Sand 3 sets;5 reps;without UE support                    PT Short Term Goals - 12/30/20 1054      PT SHORT TERM GOAL #1   Title independent with intial HEP    Status Achieved             PT Long Term Goals - 12/09/20 1712      PT LONG TERM GOAL #1   Title increase LE strength to 4+/5    Time 12    Period Weeks    Status New      PT LONG TERM GOAL #2   Title decrease back and knee pain 25%    Time 12    Period Weeks    Status New      PT LONG TERM GOAL #3   Title decrease TUG time to 14 seconds    Time 12    Period Weeks    Status New      PT LONG TERM GOAL #4   Title increase BERG balance test to 45/56    Time 12    Period Weeks    Status New       PT LONG TERM GOAL #5   Title walk 300 feet with SPC    Time 12    Period Weeks    Status New                 Plan - 12/30/20 1051    Clinical Impression Statement RLE weakness remains compared to the L noted with interventions. Sit it stand and standing marches did cause increase fatigue. No reports of pain with activity. Cues needed to increase gait speed.    Personal Factors and Comorbidities Comorbidity 3+    Examination-Activity Limitations Bathing    Examination-Participation Restrictions Meal Prep;Cleaning;Community Activity;Shop;Driving;Laundry    Stability/Clinical Decision Making Evolving/Moderate complexity    Rehab Potential Good    PT Frequency 2x / week    PT Duration 8 weeks    PT Treatment/Interventions ADLs/Self Care Home Management;Cryotherapy;Electrical Stimulation;Moist Heat;Gait training;Neuromuscular re-education;Balance training;Therapeutic exercise;Therapeutic activities;Functional mobility training;Stair training;Patient/family education;Manual techniques    PT Next Visit Plan slowly start strength and balance for increased safety           Patient will benefit from skilled therapeutic intervention in order to improve the following deficits and impairments:  Abnormal gait,Decreased range of motion,Difficulty walking,Cardiopulmonary status limiting activity,Decreased endurance,Decreased activity tolerance,Pain,Improper body mechanics,Decreased balance,Decreased mobility,Decreased strength,Postural dysfunction  Visit Diagnosis: Chronic pain of right knee  Chronic bilateral low back pain without sciatica  Dizziness and giddiness  Difficulty in walking, not elsewhere classified  Muscle weakness (generalized)     Problem List Patient Active Problem List   Diagnosis Date Noted  . Hyperlipidemia   . Diarrhea   . Scar 06/19/2019  . Vaginal yeast infection 05/26/2019  . At risk for adverse drug event 05/02/2019  . CHF (congestive heart failure)  ([place]) 05/01/2019  . Pressure ulcer of sacral region, stage 2 ([place]) 05/01/2019  . Displaced supracondylar fracture of  distal end of right femur without intracondylar extension ([place]) 04/19/2019  . Closed right femoral fracture ([place]) 04/17/2019  . Sleep apnea   . Glaucoma   . Essential hypertension, benign 09/03/2014  . Morbid obesity ([place]) 03/06/2014  . HTN (hypertension) 03/06/2014  . Vertigo 03/06/2014  . Type 2 diabetes mellitus ([place]) 03/06/2014  . Pure hypercholesterolemia 03/06/2014    Scot Jun, PTA 12/30/2020, 10:54 AM  [place]. [place], [place], [address] Phone: [phone]   Fax:  [phone]  Name: Jessica [name] MRN: [id number] Date of Birth: 03-09-1946

## 2021-01-01 ENCOUNTER — Ambulatory Visit: Payer: Medicare Other | Admitting: Physical Therapy

## 2021-01-01 ENCOUNTER — Other Ambulatory Visit: Payer: Self-pay

## 2021-01-01 ENCOUNTER — Encounter: Payer: Self-pay | Admitting: Physical Therapy

## 2021-01-01 DIAGNOSIS — G8929 Other chronic pain: Secondary | ICD-10-CM

## 2021-01-01 DIAGNOSIS — M545 Low back pain, unspecified: Secondary | ICD-10-CM | POA: Diagnosis not present

## 2021-01-01 DIAGNOSIS — M6281 Muscle weakness (generalized): Secondary | ICD-10-CM | POA: Diagnosis not present

## 2021-01-01 DIAGNOSIS — R42 Dizziness and giddiness: Secondary | ICD-10-CM

## 2021-01-01 DIAGNOSIS — M25561 Pain in right knee: Secondary | ICD-10-CM | POA: Diagnosis not present

## 2021-01-01 DIAGNOSIS — R262 Difficulty in walking, not elsewhere classified: Secondary | ICD-10-CM | POA: Diagnosis not present

## 2021-01-01 NOTE — Therapy (Signed)
[place]. [place], [place], [address] Phone: [phone]   Fax:  [phone]  Physical Therapy Treatment  Patient Details  Name: Jessica [name] MRN: [id number] Date of Birth: 08-Feb-1946 Referring Provider (PT): Addison Lank   Encounter Date: 01/01/2021   PT End of Session - 01/01/21 1052    Visit Number 5    Date for PT Re-Evaluation 02/06/21    PT Start Time 0102    PT Stop Time 1055    PT Time Calculation (min) 40 min    Activity Tolerance Patient tolerated treatment well    Behavior During Therapy [facility] for tasks assessed/performed           Past Medical History:  Diagnosis Date  . Anemia   . Anxiety   . Arthritis   . Cataract   . Depression   . Diabetes mellitus without complication ([place])   . Diastolic dysfunction   . Frozen shoulder   . Glaucoma   . Headache    History of migraines  . Heart murmur   . History of kidney stones   . Hyperlipidemia   . Hypertension   . Macular degeneration   . Neuropathy   . Obesity   . Sleep apnea   . Stroke [facility])    has been told by a Dr. Mort Sawyers that she may have had a mini stroke but no further testing was done to confirm  . Tachycardia     Past Surgical History:  Procedure Laterality Date  . CARDIAC CATHETERIZATION     -6/09-normal ejection fraction 65%, no angiographically significant CAD., M. Skains MD  . CESAREAN SECTION    . COLONOSCOPY    . foot spurs removed    . HYSTEROSCOPY WITH D & C N/A 06/28/2017   Procedure: DILATATION AND CURETTAGE /HYSTEROSCOPY/POLYPECTOMY WITH MYOSURE;  Surgeon: Christophe Louis, MD;  Location: [facility];  Service: Gynecology;  Laterality: N/A;  . JOINT REPLACEMENT     right knee replaced  . knee spurs removed    . ORIF FEMUR FRACTURE Right 04/19/2019   Procedure: OPEN REDUCTION INTERNAL FIXATION (ORIF) DISTAL FEMUR FRACTURE;  Surgeon: Rod Can, MD;  Location: [facility];  Service: Orthopedics;  Laterality: Right;  .  shoulder spurs removed    . TONSILLECTOMY AND ADENOIDECTOMY    . UPPER GI ENDOSCOPY      There were no vitals filed for this visit.   Subjective Assessment - 01/01/21 1015    Subjective "Ok"    Currently in Pain? Yes    Pain Score 2     Pain Location Leg    Pain Orientation Right;Lateral                             [facility] Adult PT Treatment/Exercise - 01/01/21 0001      Ambulation/Gait   Gait Comments gait with 4WW x200 ft around clinic with cues for upright standing and increased foot clearance      Knee/Hip Exercises: Aerobic   Nustep L5 x 7 min      Knee/Hip Exercises: Standing   Other Standing Knee Exercises standing marches with rollator 2x10      Knee/Hip Exercises: Seated   Long Arc Quad Both;2 sets;15 reps    Long Arc Quad Weight 3 lbs.    Ball Squeeze 2x10    Other Seated Knee/Hip Exercises Seted Rows green 2x10    Marching Both;2 sets;10 reps  Marching Weights 3 lbs.    Hamstring Curl Both;2 sets;15 reps    Hamstring Limitations red tb    Abduction/Adduction  Both;2 sets;10 reps    Abd/Adduction Limitations green tband    Sit to Sand without UE support;2 sets;10 reps   holding yellow ball                   PT Short Term Goals - 12/30/20 1054      PT SHORT TERM GOAL #1   Title independent with intial HEP    Status Achieved             PT Long Term Goals - 12/09/20 1712      PT LONG TERM GOAL #1   Title increase LE strength to 4+/5    Time 12    Period Weeks    Status New      PT LONG TERM GOAL #2   Title decrease back and knee pain 25%    Time 12    Period Weeks    Status New      PT LONG TERM GOAL #3   Title decrease TUG time to 14 seconds    Time 12    Period Weeks    Status New      PT LONG TERM GOAL #4   Title increase BERG balance test to 45/56    Time 12    Period Weeks    Status New      PT LONG TERM GOAL #5   Title walk 300 feet with SPC    Time 12    Period Weeks    Status New                  Plan - 01/01/21 1053    Clinical Impression Statement Transitioned from aerobic warm up into gait trial to aid in increase endurance. Some fatigue noted towards the end of gait trial. Increase reps tolerated with LAQ and HS curls without issue. Some forward momentum use with sit to stands. Standing marching was taxing on pt    Personal Factors and Comorbidities Comorbidity 3+    Comorbidities anxiety, depression, DM, anemia, obesity, CVA, RC repair, TKA, ORIF of the right femur and left tibia    Examination-Activity Limitations Bathing    Examination-Participation Restrictions Meal Prep;Cleaning;Community Activity;Shop;Driving;Laundry    Stability/Clinical Decision Making Evolving/Moderate complexity    Rehab Potential Good    PT Frequency 2x / week    PT Duration 8 weeks    PT Treatment/Interventions ADLs/Self Care Home Management;Cryotherapy;Electrical Stimulation;Moist Heat;Gait training;Neuromuscular re-education;Balance training;Therapeutic exercise;Therapeutic activities;Functional mobility training;Stair training;Patient/family education;Manual techniques    PT Next Visit Plan slowly start strength and balance for increased safety           Patient will benefit from skilled therapeutic intervention in order to improve the following deficits and impairments:  Abnormal gait,Decreased range of motion,Difficulty walking,Cardiopulmonary status limiting activity,Decreased endurance,Decreased activity tolerance,Pain,Improper body mechanics,Decreased balance,Decreased mobility,Decreased strength,Postural dysfunction  Visit Diagnosis: Chronic pain of right knee  Chronic bilateral low back pain without sciatica  Dizziness and giddiness     Problem List Patient Active Problem List   Diagnosis Date Noted  . Hyperlipidemia   . Diarrhea   . Scar 06/19/2019  . Vaginal yeast infection 05/26/2019  . At risk for adverse drug event 05/02/2019  . CHF (congestive heart  failure) ([place]) 05/01/2019  . Pressure ulcer of sacral region, stage 2 ([place]) 05/01/2019  . Displaced supracondylar fracture of distal  end of right femur without intracondylar extension ([place]) 04/19/2019  . Closed right femoral fracture ([place]) 04/17/2019  . Sleep apnea   . Glaucoma   . Essential hypertension, benign 09/03/2014  . Morbid obesity ([place]) 03/06/2014  . HTN (hypertension) 03/06/2014  . Vertigo 03/06/2014  . Type 2 diabetes mellitus ([place]) 03/06/2014  . Pure hypercholesterolemia 03/06/2014    Scot Jun 01/01/2021, 10:55 AM  [place]. [place], [place], [address] Phone: [phone]   Fax:  [phone]  Name: Jessica [name] MRN: [id number] Date of Birth: 04/23/1946

## 2021-01-06 ENCOUNTER — Ambulatory Visit: Payer: Medicare Other | Admitting: Physical Therapy

## 2021-01-06 ENCOUNTER — Encounter: Payer: Self-pay | Admitting: Physical Therapy

## 2021-01-06 ENCOUNTER — Other Ambulatory Visit: Payer: Self-pay

## 2021-01-06 DIAGNOSIS — M6281 Muscle weakness (generalized): Secondary | ICD-10-CM

## 2021-01-06 DIAGNOSIS — R42 Dizziness and giddiness: Secondary | ICD-10-CM

## 2021-01-06 DIAGNOSIS — R262 Difficulty in walking, not elsewhere classified: Secondary | ICD-10-CM

## 2021-01-06 DIAGNOSIS — G8929 Other chronic pain: Secondary | ICD-10-CM

## 2021-01-06 DIAGNOSIS — M25561 Pain in right knee: Secondary | ICD-10-CM | POA: Diagnosis not present

## 2021-01-06 DIAGNOSIS — M545 Low back pain, unspecified: Secondary | ICD-10-CM | POA: Diagnosis not present

## 2021-01-06 NOTE — Therapy (Signed)
[place]. [place], [place], [address] Phone: [phone]   Fax:  [phone]  Physical Therapy Treatment  Patient Details  Name: Jessica [name] MRN: [id number] Date of Birth: 1946-05-25 Referring Provider (PT): Addison Lank   Encounter Date: 01/06/2021   PT End of Session - 01/06/21 1052    Visit Number 6    Date for PT Re-Evaluation 02/06/21    PT Start Time 1010    PT Stop Time 1052    PT Time Calculation (min) 42 min    Activity Tolerance Patient tolerated treatment well    Behavior During Therapy [facility] for tasks assessed/performed           Past Medical History:  Diagnosis Date  . Anemia   . Anxiety   . Arthritis   . Cataract   . Depression   . Diabetes mellitus without complication ([place])   . Diastolic dysfunction   . Frozen shoulder   . Glaucoma   . Headache    History of migraines  . Heart murmur   . History of kidney stones   . Hyperlipidemia   . Hypertension   . Macular degeneration   . Neuropathy   . Obesity   . Sleep apnea   . Stroke [facility])    has been told by a Dr. Mort Sawyers that she may have had a mini stroke but no further testing was done to confirm  . Tachycardia     Past Surgical History:  Procedure Laterality Date  . CARDIAC CATHETERIZATION     -6/09-normal ejection fraction 65%, no angiographically significant CAD., M. Skains MD  . CESAREAN SECTION    . COLONOSCOPY    . foot spurs removed    . HYSTEROSCOPY WITH D & C N/A 06/28/2017   Procedure: DILATATION AND CURETTAGE /HYSTEROSCOPY/POLYPECTOMY WITH MYOSURE;  Surgeon: Christophe Louis, MD;  Location: [facility];  Service: Gynecology;  Laterality: N/A;  . JOINT REPLACEMENT     right knee replaced  . knee spurs removed    . ORIF FEMUR FRACTURE Right 04/19/2019   Procedure: OPEN REDUCTION INTERNAL FIXATION (ORIF) DISTAL FEMUR FRACTURE;  Surgeon: Rod Can, MD;  Location: [facility];  Service: Orthopedics;  Laterality: Right;  .  shoulder spurs removed    . TONSILLECTOMY AND ADENOIDECTOMY    . UPPER GI ENDOSCOPY      There were no vitals filed for this visit.   Subjective Assessment - 01/06/21 1010    Subjective "Im ok" had some pain when she went home after last session    Currently in Pain? Yes    Pain Score 1     Pain Location Hip    Pain Orientation Right                             [facility] Adult PT Treatment/Exercise - 01/06/21 0001      Ambulation/Gait   Ambulation/Gait Yes    Ambulation/Gait Assistance 4: Min guard    Ambulation Distance (Feet) 48 Feet    Assistive device Straight cane    Gait Pattern Step-through pattern;Decreased arm swing - right   unsteady & stiff   Ambulation Surface Level;Indoor    Gait Comments x3      Knee/Hip Exercises: Aerobic   Nustep L5 x 7 min      Knee/Hip Exercises: Standing   Other Standing Knee Exercises Stanidng rows & ext green 2x15  Knee/Hip Exercises: Seated   Long Arc Quad Both;2 sets;15 reps    Long Arc Quad Weight 3 lbs.    Ball Squeeze 2x15    Hamstring Curl Both;2 sets;15 reps    Hamstring Limitations yellow    Sit to Sand without UE support;2 sets;10 reps   holding yellow ball                   PT Short Term Goals - 12/30/20 1054      PT SHORT TERM GOAL #1   Title independent with intial HEP    Status Achieved             PT Long Term Goals - 01/06/21 1054      PT LONG TERM GOAL #1   Title increase LE strength to 4+/5    Status On-going      PT LONG TERM GOAL #2   Title decrease back and knee pain 25%    Status On-going      PT LONG TERM GOAL #3   Title decrease TUG time to 14 seconds    Status On-going      PT LONG TERM GOAL #4   Title increase BERG balance test to 45/56    Status On-going                 Plan - 01/06/21 1052    Clinical Impression Statement Pt did well progressing with gait using SPC. She reports having neuropathy in both feet casing some instability ambulating  with SPC. Increase fatigue noted ambulating with LRAD. Some difficulty noted with STS today, could be due to fatigue. Cues for full ROM needed with HS curls.    Comorbidities anxiety, depression, DM, anemia, obesity, CVA, RC repair, TKA, ORIF of the right femur and left tibia    Examination-Activity Limitations Bathing    Examination-Participation Restrictions Meal Prep;Cleaning;Community Activity;Shop;Driving;Laundry    Stability/Clinical Decision Making Evolving/Moderate complexity    Rehab Potential Good    PT Duration 8 weeks    PT Treatment/Interventions ADLs/Self Care Home Management;Cryotherapy;Electrical Stimulation;Moist Heat;Gait training;Neuromuscular re-education;Balance training;Therapeutic exercise;Therapeutic activities;Functional mobility training;Stair training;Patient/family education;Manual techniques    PT Next Visit Plan slowly start strength and balance for increased safety           Patient will benefit from skilled therapeutic intervention in order to improve the following deficits and impairments:  Abnormal gait,Decreased range of motion,Difficulty walking,Cardiopulmonary status limiting activity,Decreased endurance,Decreased activity tolerance,Pain,Improper body mechanics,Decreased balance,Decreased mobility,Decreased strength,Postural dysfunction  Visit Diagnosis: Chronic pain of right knee  Chronic bilateral low back pain without sciatica  Dizziness and giddiness  Muscle weakness (generalized)  Difficulty in walking, not elsewhere classified     Problem List Patient Active Problem List   Diagnosis Date Noted  . Hyperlipidemia   . Diarrhea   . Scar 06/19/2019  . Vaginal yeast infection 05/26/2019  . At risk for adverse drug event 05/02/2019  . CHF (congestive heart failure) ([place]) 05/01/2019  . Pressure ulcer of sacral region, stage 2 ([place]) 05/01/2019  . Displaced supracondylar fracture of distal end of right femur without intracondylar extension  ([place]) 04/19/2019  . Closed right femoral fracture ([place]) 04/17/2019  . Sleep apnea   . Glaucoma   . Essential hypertension, benign 09/03/2014  . Morbid obesity ([place]) 03/06/2014  . HTN (hypertension) 03/06/2014  . Vertigo 03/06/2014  . Type 2 diabetes mellitus ([place]) 03/06/2014  . Pure hypercholesterolemia 03/06/2014    Scot Jun, PTA 01/06/2021, 10:55 AM  [place]  [address]. [place], [place], [address] Phone: [phone]   Fax:  [phone]  Name: Jessica [name] MRN: [id number] Date of Birth: 12/27/1945

## 2021-01-07 DIAGNOSIS — E782 Mixed hyperlipidemia: Secondary | ICD-10-CM | POA: Diagnosis not present

## 2021-01-07 DIAGNOSIS — B373 Candidiasis of vulva and vagina: Secondary | ICD-10-CM | POA: Diagnosis not present

## 2021-01-07 DIAGNOSIS — R300 Dysuria: Secondary | ICD-10-CM | POA: Diagnosis not present

## 2021-01-07 DIAGNOSIS — R350 Frequency of micturition: Secondary | ICD-10-CM | POA: Diagnosis not present

## 2021-01-08 ENCOUNTER — Ambulatory Visit: Payer: Medicare Other | Admitting: Physical Therapy

## 2021-01-08 ENCOUNTER — Other Ambulatory Visit: Payer: Self-pay

## 2021-01-08 ENCOUNTER — Encounter: Payer: Self-pay | Admitting: Physical Therapy

## 2021-01-08 DIAGNOSIS — R42 Dizziness and giddiness: Secondary | ICD-10-CM | POA: Diagnosis not present

## 2021-01-08 DIAGNOSIS — G8929 Other chronic pain: Secondary | ICD-10-CM

## 2021-01-08 DIAGNOSIS — M545 Low back pain, unspecified: Secondary | ICD-10-CM

## 2021-01-08 DIAGNOSIS — R262 Difficulty in walking, not elsewhere classified: Secondary | ICD-10-CM | POA: Diagnosis not present

## 2021-01-08 DIAGNOSIS — M6281 Muscle weakness (generalized): Secondary | ICD-10-CM | POA: Diagnosis not present

## 2021-01-08 DIAGNOSIS — M25561 Pain in right knee: Secondary | ICD-10-CM | POA: Diagnosis not present

## 2021-01-08 NOTE — Therapy (Signed)
[place]. [place], [place], [address] Phone: [phone]   Fax:  [phone]  Physical Therapy Treatment  Patient Details  Name: Jessica [name] MRN: [id number] Date of Birth: 20-Dec-1945 Referring Provider (PT): Addison Lank   Encounter Date: 01/08/2021   PT End of Session - 01/08/21 1227    Visit Number 7    Date for PT Re-Evaluation 02/06/21    PT Start Time 3267    PT Stop Time 1227    PT Time Calculation (min) 42 min    Activity Tolerance Patient tolerated treatment well    Behavior During Therapy [facility] for tasks assessed/performed           Past Medical History:  Diagnosis Date  . Anemia   . Anxiety   . Arthritis   . Cataract   . Depression   . Diabetes mellitus without complication ([place])   . Diastolic dysfunction   . Frozen shoulder   . Glaucoma   . Headache    History of migraines  . Heart murmur   . History of kidney stones   . Hyperlipidemia   . Hypertension   . Macular degeneration   . Neuropathy   . Obesity   . Sleep apnea   . Stroke [facility])    has been told by a Dr. Mort Sawyers that she may have had a mini stroke but no further testing was done to confirm  . Tachycardia     Past Surgical History:  Procedure Laterality Date  . CARDIAC CATHETERIZATION     -6/09-normal ejection fraction 65%, no angiographically significant CAD., M. Skains MD  . CESAREAN SECTION    . COLONOSCOPY    . foot spurs removed    . HYSTEROSCOPY WITH D & C N/A 06/28/2017   Procedure: DILATATION AND CURETTAGE /HYSTEROSCOPY/POLYPECTOMY WITH MYOSURE;  Surgeon: Christophe Louis, MD;  Location: [facility];  Service: Gynecology;  Laterality: N/A;  . JOINT REPLACEMENT     right knee replaced  . knee spurs removed    . ORIF FEMUR FRACTURE Right 04/19/2019   Procedure: OPEN REDUCTION INTERNAL FIXATION (ORIF) DISTAL FEMUR FRACTURE;  Surgeon: Rod Can, MD;  Location: [facility];  Service: Orthopedics;  Laterality: Right;  .  shoulder spurs removed    . TONSILLECTOMY AND ADENOIDECTOMY    . UPPER GI ENDOSCOPY      There were no vitals filed for this visit.   Subjective Assessment - 01/08/21 1143    Subjective "Feeling ok" "R leg always have pain"    Pertinent History stroke anemia, anxiety, depression, DM among others    Currently in Pain? Yes    Pain Score 1     Pain Location Leg    Pain Orientation Right                             [facility] Adult PT Treatment/Exercise - 01/08/21 0001      Ambulation/Gait   Ambulation/Gait Yes    Ambulation/Gait Assistance 4: Min guard    Ambulation Distance (Feet) 48 Feet    Assistive device Straight cane    Gait Pattern Step-through pattern;Decreased arm swing - right    Ambulation Surface Level;Indoor    Gait Comments x3      High Level Balance   High Level Balance Activities Side stepping      Knee/Hip Exercises: Aerobic   Nustep L5 x 7 min  Knee/Hip Exercises: Standing   Other Standing Knee Exercises Stanidng rows & ext green 2x15      Knee/Hip Exercises: Seated   Ball Squeeze 2x15    Hamstring Curl Both;2 sets;15 reps    Hamstring Limitations yellow                    PT Short Term Goals - 12/30/20 1054      PT SHORT TERM GOAL #1   Title independent with intial HEP    Status Achieved             PT Long Term Goals - 01/08/21 1219      PT LONG TERM GOAL #5   Title walk 300 feet with SPC    Status On-going                 Plan - 01/08/21 1228    Clinical Impression Statement Pt did well with good carryover for last session regarding gait. Increase time needed with turns. Cues needed not to use bands for balance with standing rows and extensions. Side steps performed for only the length of mat table so pt could sit at any point of time. RLE weakness noted with HS curls.    Comorbidities anxiety, depression, DM, anemia, obesity, CVA, RC repair, TKA, ORIF of the right femur and left tibia     Examination-Participation Restrictions Meal Prep;Cleaning;Community Activity;Shop;Driving;Laundry    Stability/Clinical Decision Making Evolving/Moderate complexity    Rehab Potential Good    PT Treatment/Interventions ADLs/Self Care Home Management;Cryotherapy;Electrical Stimulation;Moist Heat;Gait training;Neuromuscular re-education;Balance training;Therapeutic exercise;Therapeutic activities;Functional mobility training;Stair training;Patient/family education;Manual techniques    PT Next Visit Plan slowly start strength and balance for increased safety           Patient will benefit from skilled therapeutic intervention in order to improve the following deficits and impairments:  Abnormal gait,Decreased range of motion,Difficulty walking,Cardiopulmonary status limiting activity,Decreased endurance,Decreased activity tolerance,Pain,Improper body mechanics,Decreased balance,Decreased mobility,Decreased strength,Postural dysfunction  Visit Diagnosis: Chronic bilateral low back pain without sciatica  Dizziness and giddiness  Chronic pain of right knee  Muscle weakness (generalized)  Difficulty in walking, not elsewhere classified     Problem List Patient Active Problem List   Diagnosis Date Noted  . Hyperlipidemia   . Diarrhea   . Scar 06/19/2019  . Vaginal yeast infection 05/26/2019  . At risk for adverse drug event 05/02/2019  . CHF (congestive heart failure) ([place]) 05/01/2019  . Pressure ulcer of sacral region, stage 2 ([place]) 05/01/2019  . Displaced supracondylar fracture of distal end of right femur without intracondylar extension ([place]) 04/19/2019  . Closed right femoral fracture ([place]) 04/17/2019  . Sleep apnea   . Glaucoma   . Essential hypertension, benign 09/03/2014  . Morbid obesity ([place]) 03/06/2014  . HTN (hypertension) 03/06/2014  . Vertigo 03/06/2014  . Type 2 diabetes mellitus ([place]) 03/06/2014  . Pure hypercholesterolemia 03/06/2014    Scot Jun 01/08/2021, 12:32 PM  [name]. [place], [place], [address] Phone: [phone]   Fax:  [phone]  Name: [name] MRN: [id number] Date of Birth: 1946-08-18

## 2021-01-13 ENCOUNTER — Other Ambulatory Visit: Payer: Self-pay | Admitting: Cardiology

## 2021-01-13 DIAGNOSIS — I5032 Chronic diastolic (congestive) heart failure: Secondary | ICD-10-CM

## 2021-01-13 DIAGNOSIS — I10 Essential (primary) hypertension: Secondary | ICD-10-CM

## 2021-01-15 ENCOUNTER — Encounter: Payer: Self-pay | Admitting: Physical Therapy

## 2021-01-15 ENCOUNTER — Other Ambulatory Visit: Payer: Self-pay

## 2021-01-15 ENCOUNTER — Ambulatory Visit: Payer: Medicare Other | Admitting: Physical Therapy

## 2021-01-15 DIAGNOSIS — R42 Dizziness and giddiness: Secondary | ICD-10-CM

## 2021-01-15 DIAGNOSIS — M6281 Muscle weakness (generalized): Secondary | ICD-10-CM | POA: Diagnosis not present

## 2021-01-15 DIAGNOSIS — G8929 Other chronic pain: Secondary | ICD-10-CM

## 2021-01-15 DIAGNOSIS — M25561 Pain in right knee: Secondary | ICD-10-CM

## 2021-01-15 DIAGNOSIS — M545 Low back pain, unspecified: Secondary | ICD-10-CM | POA: Diagnosis not present

## 2021-01-15 DIAGNOSIS — R262 Difficulty in walking, not elsewhere classified: Secondary | ICD-10-CM | POA: Diagnosis not present

## 2021-01-15 NOTE — Therapy (Signed)
[facility]. [place], [place], [address] Phone: [phone]   Fax:  [phone]  Physical Therapy Treatment  Patient Details  Name: Jessica [name] MRN: [id number] Date of Birth: April 30, 1946 Referring Provider (PT): Addison Lank   Encounter Date: 01/15/2021   PT End of Session - 01/15/21 1342    Visit Number 8    Date for PT Re-Evaluation 02/06/21    PT Start Time 1300    PT Stop Time 1343    PT Time Calculation (min) 43 min    Activity Tolerance Patient tolerated treatment well    Behavior During Therapy [facility] for tasks assessed/performed           Past Medical History:  Diagnosis Date  . Anemia   . Anxiety   . Arthritis   . Cataract   . Depression   . Diabetes mellitus without complication ([place])   . Diastolic dysfunction   . Frozen shoulder   . Glaucoma   . Headache    History of migraines  . Heart murmur   . History of kidney stones   . Hyperlipidemia   . Hypertension   . Macular degeneration   . Neuropathy   . Obesity   . Sleep apnea   . Stroke [facility])    has been told by a Dr. Mort Sawyers that she may have had a mini stroke but no further testing was done to confirm  . Tachycardia     Past Surgical History:  Procedure Laterality Date  . CARDIAC CATHETERIZATION     -6/09-normal ejection fraction 65%, no angiographically significant CAD., M. Skains MD  . CESAREAN SECTION    . COLONOSCOPY    . foot spurs removed    . HYSTEROSCOPY WITH D & C N/A 06/28/2017   Procedure: DILATATION AND CURETTAGE /HYSTEROSCOPY/POLYPECTOMY WITH MYOSURE;  Surgeon: Christophe Louis, MD;  Location: [facility];  Service: Gynecology;  Laterality: N/A;  . JOINT REPLACEMENT     right knee replaced  . knee spurs removed    . ORIF FEMUR FRACTURE Right 04/19/2019   Procedure: OPEN REDUCTION INTERNAL FIXATION (ORIF) DISTAL FEMUR FRACTURE;  Surgeon: Rod Can, MD;  Location: [facility];  Service: Orthopedics;  Laterality: Right;  .  shoulder spurs removed    . TONSILLECTOMY AND ADENOIDECTOMY    . UPPER GI ENDOSCOPY      There were no vitals filed for this visit.   Subjective Assessment - 01/15/21 1258    Subjective Feeling ok    Pertinent History stroke anemia, anxiety, depression, DM among others    Currently in Pain? Yes    Pain Location Knee    Pain Orientation Right                             [facility] Adult PT Treatment/Exercise - 01/15/21 0001      Ambulation/Gait   Ambulation/Gait Yes    Ambulation/Gait Assistance 5: Supervision    Ambulation Distance (Feet) 200 Feet    Assistive device 4-wheeled walker    Gait Pattern Step-through pattern    Ambulation Surface Unlevel;Outdoor;Paved    Gait Comments around front parking lot      High Level Balance   High Level Balance Activities Side stepping      Knee/Hip Exercises: Standing   Other Standing Knee Exercises box taps 2x3 with SPC    Other Standing Knee Exercises Stanidng rows & ext green 2x15  Knee/Hip Exercises: Seated   Long Arc Quad Both;2 sets;15 reps    Long Arc Quad Weight 3 lbs.    Marching Both;2 sets;10 reps    Marching Weights 3 lbs.    Hamstring Curl Both;2 sets;15 reps    Hamstring Limitations red    Sit to Sand 2 sets;5 reps                    PT Short Term Goals - 12/30/20 1054      PT SHORT TERM GOAL #1   Title independent with intial HEP    Status Achieved             PT Long Term Goals - 01/15/21 1330      PT LONG TERM GOAL #2   Title decrease back and knee pain 25%    Status Partially Met      PT LONG TERM GOAL #5   Title walk 300 feet with SPC    Status On-going                 Plan - 01/15/21 1343    Clinical Impression Statement Pt able to progress to outdoor ambulation over uneven terrain. Again pt abe to complete side step for the length of mat table to she could sit down at any point of time. Pt did have some LOB with side steps today, but able to recover when  LE touch mat table. Instability noted with 2 inch box taps with SPC.    Personal Factors and Comorbidities Comorbidity 3+    Comorbidities anxiety, depression, DM, anemia, obesity, CVA, RC repair, TKA, ORIF of the right femur and left tibia    Examination-Participation Restrictions Meal Prep;Cleaning;Community Activity;Shop;Driving;Laundry    Stability/Clinical Decision Making Evolving/Moderate complexity    Rehab Potential Good    PT Frequency 2x / week    PT Duration 8 weeks    PT Treatment/Interventions ADLs/Self Care Home Management;Cryotherapy;Electrical Stimulation;Moist Heat;Gait training;Neuromuscular re-education;Balance training;Therapeutic exercise;Therapeutic activities;Functional mobility training;Stair training;Patient/family education;Manual techniques    PT Next Visit Plan slowly start strength and balance for increased safety           Patient will benefit from skilled therapeutic intervention in order to improve the following deficits and impairments:  Abnormal gait,Decreased range of motion,Difficulty walking,Cardiopulmonary status limiting activity,Decreased endurance,Decreased activity tolerance,Pain,Improper body mechanics,Decreased balance,Decreased mobility,Decreased strength,Postural dysfunction  Visit Diagnosis: Dizziness and giddiness  Chronic pain of right knee  Chronic bilateral low back pain without sciatica     Problem List Patient Active Problem List   Diagnosis Date Noted  . Hyperlipidemia   . Diarrhea   . Scar 06/19/2019  . Vaginal yeast infection 05/26/2019  . At risk for adverse drug event 05/02/2019  . CHF (congestive heart failure) ([place]) 05/01/2019  . Pressure ulcer of sacral region, stage 2 ([place]) 05/01/2019  . Displaced supracondylar fracture of distal end of right femur without intracondylar extension ([place]) 04/19/2019  . Closed right femoral fracture ([place]) 04/17/2019  . Sleep apnea   . Glaucoma   . Essential hypertension, benign  09/03/2014  . Morbid obesity ([place]) 03/06/2014  . HTN (hypertension) 03/06/2014  . Vertigo 03/06/2014  . Type 2 diabetes mellitus ([place]) 03/06/2014  . Pure hypercholesterolemia 03/06/2014    Scot Jun, PTA 01/15/2021, 1:48 PM  [place]. [place], [place], [address] Phone: [phone]   Fax:  [phone]  Name: Jessica [name] MRN: [id number] Date of Birth: [birth year]

## 2021-01-16 DIAGNOSIS — N950 Postmenopausal bleeding: Secondary | ICD-10-CM | POA: Diagnosis not present

## 2021-01-20 ENCOUNTER — Other Ambulatory Visit: Payer: Self-pay

## 2021-01-20 ENCOUNTER — Ambulatory Visit: Payer: Medicare Other | Admitting: Physical Therapy

## 2021-01-20 ENCOUNTER — Encounter: Payer: Self-pay | Admitting: Physical Therapy

## 2021-01-20 DIAGNOSIS — R42 Dizziness and giddiness: Secondary | ICD-10-CM | POA: Diagnosis not present

## 2021-01-20 DIAGNOSIS — R262 Difficulty in walking, not elsewhere classified: Secondary | ICD-10-CM | POA: Diagnosis not present

## 2021-01-20 DIAGNOSIS — G8929 Other chronic pain: Secondary | ICD-10-CM | POA: Diagnosis not present

## 2021-01-20 DIAGNOSIS — M545 Low back pain, unspecified: Secondary | ICD-10-CM | POA: Diagnosis not present

## 2021-01-20 DIAGNOSIS — M25561 Pain in right knee: Secondary | ICD-10-CM | POA: Diagnosis not present

## 2021-01-20 DIAGNOSIS — M6281 Muscle weakness (generalized): Secondary | ICD-10-CM | POA: Diagnosis not present

## 2021-01-20 NOTE — Therapy (Signed)
[place]. [place], [place], [address] Phone: [phone]   Fax:  [phone]  Physical Therapy Treatment  Patient Details  Name: Jessica [name] MRN: [id number] Date of Birth: [birth year] Referring Provider (PT): Addison Lank   Encounter Date: 01/20/2021   PT End of Session - 01/20/21 1141    Visit Number 9    Date for PT Re-Evaluation 02/06/21    PT Start Time 1100    PT Stop Time 1142    PT Time Calculation (min) 42 min    Activity Tolerance Patient tolerated treatment well    Behavior During Therapy [facility] for tasks assessed/performed           Past Medical History:  Diagnosis Date  . Anemia   . Anxiety   . Arthritis   . Cataract   . Depression   . Diabetes mellitus without complication ([place])   . Diastolic dysfunction   . Frozen shoulder   . Glaucoma   . Headache    History of migraines  . Heart murmur   . History of kidney stones   . Hyperlipidemia   . Hypertension   . Macular degeneration   . Neuropathy   . Obesity   . Sleep apnea   . Stroke [facility])    has been told by a Dr. Mort Sawyers that she may have had a mini stroke but no further testing was done to confirm  . Tachycardia     Past Surgical History:  Procedure Laterality Date  . CARDIAC CATHETERIZATION     -6/09-normal ejection fraction 65%, no angiographically significant CAD., M. Skains MD  . CESAREAN SECTION    . COLONOSCOPY    . foot spurs removed    . HYSTEROSCOPY WITH D & C N/A 06/28/2017   Procedure: DILATATION AND CURETTAGE /HYSTEROSCOPY/POLYPECTOMY WITH MYOSURE;  Surgeon: Christophe Louis, MD;  Location: [place] [facility];  Service: Gynecology;  Laterality: N/A;  . JOINT REPLACEMENT     right knee replaced  . knee spurs removed    . ORIF FEMUR FRACTURE Right 04/19/2019   Procedure: OPEN REDUCTION INTERNAL FIXATION (ORIF) DISTAL FEMUR FRACTURE;  Surgeon: Rod Can, MD;  Location: [facility];  Service: Orthopedics;  Laterality: Right;  .  shoulder spurs removed    . TONSILLECTOMY AND ADENOIDECTOMY    . UPPER GI ENDOSCOPY      There were no vitals filed for this visit.   Subjective Assessment - 01/20/21 1100    Subjective doing ok    Currently in Pain? Yes    Pain Score 1     Pain Location Knee    Pain Orientation Right              [facility] PT Assessment - 01/20/21 0001      Berg Balance Test   Sit to Stand Able to stand without using hands and stabilize independently    Standing Unsupported Able to stand safely 2 minutes    Sitting with Back Unsupported but Feet Supported on Floor or Stool Able to sit safely and securely 2 minutes    Stand to Sit Controls descent by using hands    Transfers Able to transfer safely, definite need of hands    Standing Unsupported with Eyes Closed Able to stand 10 seconds safely    Standing Unsupported with Feet Together Able to place feet together independently and stand for 1 minute with supervision    From Standing, Reach Forward with Outstretched Arm Can  reach confidently >25 cm (10")    From Standing Position, Pick up Object from Floor Unable to try/needs assist to keep balance    From Standing Position, Turn to Look Behind Over each Shoulder Turn sideways only but maintains balance    Turn 360 Degrees Able to turn 360 degrees safely but slowly    Standing Unsupported, Alternately Place Feet on Step/Stool Needs assistance to keep from falling or unable to try    Standing Unsupported, One Foot in [employer] balance while stepping or standing    Standing on One Leg Unable to try or needs assist to prevent fall    Total Score 33      Timed Up and Go Test   Normal TUG (seconds) 21                         [facility] Adult PT Treatment/Exercise - 01/20/21 0001      Knee/Hip Exercises: Aerobic   Nustep L5 x 7 min      Knee/Hip Exercises: Seated   Long Arc Quad Both;2 sets;15 reps    Long Arc Quad Weight 3 lbs.    Marching Both;2 sets;10 reps    Marching Weights 3  lbs.                    PT Short Term Goals - 12/30/20 1054      PT SHORT TERM GOAL #1   Title independent with intial HEP    Status Achieved             PT Long Term Goals - 01/20/21 1129      PT LONG TERM GOAL #3   Title decrease TUG time to 14 seconds    Status On-going   22 sec                Plan - 01/20/21 1142    Clinical Impression Statement Pt has progressed increasing her BERG balance score as well a decreasing her TUG. Despite the improvement she remains a hight fall risk. Pt unable to attempts pick up object from floor, alt box taps, tandem stance and single leg stance.    Personal Factors and Comorbidities Comorbidity 3+    Comorbidities anxiety, depression, DM, anemia, obesity, CVA, RC repair, TKA, ORIF of the right femur and left tibia    Examination-Activity Limitations Bathing    Examination-Participation Restrictions Meal Prep;Cleaning;Community Activity;Shop;Driving;Laundry    Stability/Clinical Decision Making Evolving/Moderate complexity    Rehab Potential Good    PT Frequency 2x / week    PT Duration 8 weeks    PT Treatment/Interventions ADLs/Self Care Home Management;Cryotherapy;Electrical Stimulation;Moist Heat;Gait training;Neuromuscular re-education;Balance training;Therapeutic exercise;Therapeutic activities;Functional mobility training;Stair training;Patient/family education;Manual techniques    PT Next Visit Plan slowly start strength and balance for increased safety           Patient will benefit from skilled therapeutic intervention in order to improve the following deficits and impairments:  Abnormal gait,Decreased range of motion,Difficulty walking,Cardiopulmonary status limiting activity,Decreased endurance,Decreased activity tolerance,Pain,Improper body mechanics,Decreased balance,Decreased mobility,Decreased strength,Postural dysfunction  Visit Diagnosis: Chronic pain of right knee  Chronic bilateral low back pain  without sciatica  Dizziness and giddiness     Problem List Patient Active Problem List   Diagnosis Date Noted  . Hyperlipidemia   . Diarrhea   . Scar 06/19/2019  . Vaginal yeast infection 05/26/2019  . At risk for adverse drug event 05/02/2019  . CHF (congestive heart failure) ([place])  05/01/2019  . Pressure ulcer of sacral region, stage 2 ([place]) 05/01/2019  . Displaced supracondylar fracture of distal end of right femur without intracondylar extension ([place]) 04/19/2019  . Closed right femoral fracture ([place]) 04/17/2019  . Sleep apnea   . Glaucoma   . Essential hypertension, benign 09/03/2014  . Morbid obesity ([place]) 03/06/2014  . HTN (hypertension) 03/06/2014  . Vertigo 03/06/2014  . Type 2 diabetes mellitus ([place]) 03/06/2014  . Pure hypercholesterolemia 03/06/2014    Scot Jun 01/20/2021, 11:46 AM  [place]. [place], [place], [address] Phone: [phone]   Fax:  [phone]  Name: UYEN EICHHOLZ MRN: [id number] Date of Birth: 1946-06-19

## 2021-01-21 DIAGNOSIS — Z1231 Encounter for screening mammogram for malignant neoplasm of breast: Secondary | ICD-10-CM | POA: Diagnosis not present

## 2021-01-22 ENCOUNTER — Other Ambulatory Visit: Payer: Self-pay

## 2021-01-22 ENCOUNTER — Ambulatory Visit: Payer: Medicare Other | Admitting: Physical Therapy

## 2021-01-22 ENCOUNTER — Encounter: Payer: Self-pay | Admitting: Physical Therapy

## 2021-01-22 DIAGNOSIS — M545 Low back pain, unspecified: Secondary | ICD-10-CM

## 2021-01-22 DIAGNOSIS — R262 Difficulty in walking, not elsewhere classified: Secondary | ICD-10-CM

## 2021-01-22 DIAGNOSIS — G8929 Other chronic pain: Secondary | ICD-10-CM | POA: Diagnosis not present

## 2021-01-22 DIAGNOSIS — M25561 Pain in right knee: Secondary | ICD-10-CM | POA: Diagnosis not present

## 2021-01-22 DIAGNOSIS — R42 Dizziness and giddiness: Secondary | ICD-10-CM

## 2021-01-22 DIAGNOSIS — M6281 Muscle weakness (generalized): Secondary | ICD-10-CM

## 2021-01-22 NOTE — Therapy (Signed)
[place]. [place], [place], [address] Phone: [phone]   Fax:  [phone] Progress Note Reporting Period 12/09/20 to 01/22/21 for the first 10 visits  See note below for Objective Data and Assessment of Progress/Goals.      Physical Therapy Treatment  Patient Details  Name: Jessica [name] MRN: [id number] Date of Birth: May 11, 1946 Referring Provider (PT): Addison Lank   Encounter Date: 01/22/2021   PT End of Session - 01/22/21 1140    Visit Number 10    Date for PT Re-Evaluation 02/06/21    PT Start Time 1058    PT Stop Time 1143    PT Time Calculation (min) 45 min    Activity Tolerance Patient tolerated treatment well    Behavior During Therapy [facility] for tasks assessed/performed           Past Medical History:  Diagnosis Date  . Anemia   . Anxiety   . Arthritis   . Cataract   . Depression   . Diabetes mellitus without complication ([place])   . Diastolic dysfunction   . Frozen shoulder   . Glaucoma   . Headache    History of migraines  . Heart murmur   . History of kidney stones   . Hyperlipidemia   . Hypertension   . Macular degeneration   . Neuropathy   . Obesity   . Sleep apnea   . Stroke [facility])    has been told by a Dr. Mort Sawyers that she may have had a mini stroke but no further testing was done to confirm  . Tachycardia     Past Surgical History:  Procedure Laterality Date  . CARDIAC CATHETERIZATION     -6/09-normal ejection fraction 65%, no angiographically significant CAD., M. Skains MD  . CESAREAN SECTION    . COLONOSCOPY    . foot spurs removed    . HYSTEROSCOPY WITH D & C N/A 06/28/2017   Procedure: DILATATION AND CURETTAGE /HYSTEROSCOPY/POLYPECTOMY WITH MYOSURE;  Surgeon: Christophe Louis, MD;  Location: [facility];  Service: Gynecology;  Laterality: N/A;  . JOINT REPLACEMENT     right knee replaced  . knee spurs removed    . ORIF FEMUR FRACTURE Right 04/19/2019   Procedure: OPEN  REDUCTION INTERNAL FIXATION (ORIF) DISTAL FEMUR FRACTURE;  Surgeon: Rod Can, MD;  Location: [facility];  Service: Orthopedics;  Laterality: Right;  . shoulder spurs removed    . TONSILLECTOMY AND ADENOIDECTOMY    . UPPER GI ENDOSCOPY      There were no vitals filed for this visit.   Subjective Assessment - 01/22/21 1104    Subjective "Good" reports better mobility yesterday at her docotrs apt yesterday    Currently in Pain? Yes    Pain Score 3     Pain Location Hip    Pain Orientation Left                             [facility] Adult PT Treatment/Exercise - 01/22/21 0001      Ambulation/Gait   Ambulation/Gait Yes    Ambulation/Gait Assistance 5: Supervision;4: Min guard    Ambulation Distance (Feet) 24 Feet    Assistive device Straight cane    Gait Pattern Step-through pattern    Ambulation Surface Level;Indoor    Gait Comments x3      Knee/Hip Exercises: Stretches   Active Hamstring Stretch Both;3 reps;10 seconds    Piriformis  Stretch Both;3 reps;10 seconds;20 seconds      Knee/Hip Exercises: Aerobic   Nustep L6 x 7 min      Knee/Hip Exercises: Standing   Other Standing Knee Exercises [place] rows & ext green 2x15      Knee/Hip Exercises: Seated   Long Arc Quad Both;2 sets;10 reps    Long Arc Quad Weight 3 lbs.    Sit to [employer] 2 sets;10 reps      Knee/Hip Exercises: Supine   Bridges Strengthening;2 sets;10 reps                    PT Short Term Goals - 01/22/21 1140      PT SHORT TERM GOAL #1   Title independent with intial HEP    Status Achieved             PT Long Term Goals - 01/20/21 1129      PT LONG TERM GOAL #3   Title decrease TUG time to 14 seconds    Status On-going   22 sec                Plan - 01/22/21 1141    Clinical Impression Statement Pt enters clinic reporting some sciatica pain. Added supine hip bridges and stretching without issue.Encouragement needed for gait trial with SPC. Cue needed to  stabilize and not use Tband for balance with rows and extensions.    Personal Factors and Comorbidities Comorbidity 3+    Comorbidities anxiety, depression, DM, anemia, obesity, CVA, RC repair, TKA, ORIF of the right femur and left tibia    Examination-Activity Limitations Bathing    Examination-Participation Restrictions Meal Prep;Cleaning;Community Activity;Shop;Driving;Laundry    Stability/Clinical Decision Making Evolving/Moderate complexity    Rehab Potential Good    PT Frequency 2x / week    PT Duration 8 weeks    PT Treatment/Interventions ADLs/Self Care Home Management;Cryotherapy;Electrical Stimulation;Moist Heat;Gait training;Neuromuscular re-education;Balance training;Therapeutic exercise;Therapeutic activities;Functional mobility training;Stair training;Patient/family education;Manual techniques    PT Next Visit Plan strength and balance for increased safety           Patient will benefit from skilled therapeutic intervention in order to improve the following deficits and impairments:  Abnormal gait,Decreased range of motion,Difficulty walking,Cardiopulmonary status limiting activity,Decreased endurance,Decreased activity tolerance,Pain,Improper body mechanics,Decreased balance,Decreased mobility,Decreased strength,Postural dysfunction  Visit Diagnosis: Chronic pain of right knee  Chronic bilateral low back pain without sciatica  Dizziness and giddiness  Muscle weakness (generalized)  Difficulty in walking, not elsewhere classified     Problem List Patient Active Problem List   Diagnosis Date Noted  . Hyperlipidemia   . Diarrhea   . Scar 06/19/2019  . Vaginal yeast infection 05/26/2019  . At risk for adverse drug event 05/02/2019  . CHF (congestive heart failure) ([place]) 05/01/2019  . Pressure ulcer of sacral region, stage 2 ([place]) 05/01/2019  . Displaced supracondylar fracture of distal end of right femur without intracondylar extension ([place]) 04/19/2019  . Closed  right femoral fracture ([place]) 04/17/2019  . Sleep apnea   . Glaucoma   . Essential hypertension, benign 09/03/2014  . Morbid obesity ([place]) 03/06/2014  . HTN (hypertension) 03/06/2014  . Vertigo 03/06/2014  . Type 2 diabetes mellitus ([place]) 03/06/2014  . Pure hypercholesterolemia 03/06/2014    Scot Jun, PTA 01/22/2021, 11:42 AM  [place]. [place], [place], [address] Phone: [phone]   Fax:  [phone]  Name: Jessica [name] MRN: [id number] Date of Birth: 05/05/1946

## 2021-01-27 ENCOUNTER — Ambulatory Visit: Payer: Medicare Other | Attending: Family Medicine | Admitting: Physical Therapy

## 2021-01-27 ENCOUNTER — Other Ambulatory Visit: Payer: Self-pay

## 2021-01-27 DIAGNOSIS — M6281 Muscle weakness (generalized): Secondary | ICD-10-CM | POA: Diagnosis not present

## 2021-01-27 DIAGNOSIS — M545 Low back pain, unspecified: Secondary | ICD-10-CM | POA: Insufficient documentation

## 2021-01-27 DIAGNOSIS — G8929 Other chronic pain: Secondary | ICD-10-CM | POA: Insufficient documentation

## 2021-01-27 DIAGNOSIS — M25561 Pain in right knee: Secondary | ICD-10-CM | POA: Diagnosis not present

## 2021-01-27 DIAGNOSIS — R262 Difficulty in walking, not elsewhere classified: Secondary | ICD-10-CM | POA: Insufficient documentation

## 2021-01-27 DIAGNOSIS — R42 Dizziness and giddiness: Secondary | ICD-10-CM | POA: Insufficient documentation

## 2021-01-27 NOTE — Therapy (Signed)
[place]. [place], [place], [address] Phone: [phone]   Fax:  [phone]  Physical Therapy Treatment  Patient Details  Name: Jessica [name] MRN: [id number] Date of Birth: 1945-11-28 Referring Provider (PT): Addison Lank   Encounter Date: 01/27/2021   PT End of Session - 01/27/21 1349    Visit Number 11    Date for PT Re-Evaluation 02/06/21    PT Start Time 1130    PT Stop Time 1220    PT Time Calculation (min) 50 min           Past Medical History:  Diagnosis Date  . Anemia   . Anxiety   . Arthritis   . Cataract   . Depression   . Diabetes mellitus without complication ([place])   . Diastolic dysfunction   . Frozen shoulder   . Glaucoma   . Headache    History of migraines  . Heart murmur   . History of kidney stones   . Hyperlipidemia   . Hypertension   . Macular degeneration   . Neuropathy   . Obesity   . Sleep apnea   . Stroke [facility])    has been told by a Dr. Mort Sawyers that she may have had a mini stroke but no further testing was done to confirm  . Tachycardia     Past Surgical History:  Procedure Laterality Date  . CARDIAC CATHETERIZATION     -6/09-normal ejection fraction 65%, no angiographically significant CAD., M. Skains MD  . CESAREAN SECTION    . COLONOSCOPY    . foot spurs removed    . HYSTEROSCOPY WITH D & C N/A 06/28/2017   Procedure: DILATATION AND CURETTAGE /HYSTEROSCOPY/POLYPECTOMY WITH MYOSURE;  Surgeon: Christophe Louis, MD;  Location: [facility];  Service: Gynecology;  Laterality: N/A;  . JOINT REPLACEMENT     right knee replaced  . knee spurs removed    . ORIF FEMUR FRACTURE Right 04/19/2019   Procedure: OPEN REDUCTION INTERNAL FIXATION (ORIF) DISTAL FEMUR FRACTURE;  Surgeon: Rod Can, MD;  Location: [facility];  Service: Orthopedics;  Laterality: Right;  . shoulder spurs removed    . TONSILLECTOMY AND ADENOIDECTOMY    . UPPER GI ENDOSCOPY      There were no vitals filed  for this visit.   Subjective Assessment - 01/27/21 1135    Subjective "Doing much better" stretching from last treatment helped," pain might be from lifting the walker in and out of my car"    Currently in Pain? Yes    Pain Score 2     Pain Location Back    Pain Orientation Left                             [facility] Adult PT Treatment/Exercise - 01/27/21 0001      Transfers   Comments practiced alternate ways to fold and load rolling walker into trunk to avoid loading and twisting spine.      Ambulation/Gait   Gait Comments ambulated 24 feet in clinic w/ staright cane, step through pattern, ambulated w/ straight cane from clinic to car in parking lot   min assist, pt had decreased confidene outside on pavement     Knee/Hip Exercises: Aerobic   Nustep L6 x 7 min      Knee/Hip Exercises: Standing   Other Standing Knee Exercises marching, hip EXT, hip ABD BL LE w/ walker - 2x10  SBA with cueing     Knee/Hip Exercises: Seated   Long Arc Quad Both;3 sets;2 sets;10 reps   3#   Sit to [place] 2 sets;10 reps;without UE support   w/ weighted ball 10x CP 10x OH                   PT Short Term Goals - 01/22/21 1140      PT SHORT TERM GOAL #1   Title independent with intial HEP    Status Achieved             PT Long Term Goals - 01/20/21 1129      PT LONG TERM GOAL #3   Title decrease TUG time to 14 seconds    Status On-going   22 sec                Plan - 01/27/21 1337    Clinical Impression Statement Pt entered clinic w/ c/o pain in her left lower back stating it was better tna last week after stretching, pt feels it could have been due to taking walker in and out of car. Helped pt with alternate techniques to move walker in and out of car in a pain free way. Pt tolerted treatment well, needed verbal cueing with keeping toes forward for hip abduction in walker. Pt needed verbal cueing with gait pattern on cane.    PT Treatment/Interventions  ADLs/Self Care Home Management;Cryotherapy;Electrical Stimulation;Moist Heat;Gait training;Neuromuscular re-education;Balance training;Therapeutic exercise;Therapeutic activities;Functional mobility training;Stair training;Patient/family education;Manual techniques    PT Next Visit Plan strength and balance for increased safety           Patient will benefit from skilled therapeutic intervention in order to improve the following deficits and impairments:  Abnormal gait,Decreased range of motion,Difficulty walking,Cardiopulmonary status limiting activity,Decreased endurance,Decreased activity tolerance,Pain,Improper body mechanics,Decreased balance,Decreased mobility,Decreased strength,Postural dysfunction  Visit Diagnosis: Chronic bilateral low back pain without sciatica  Muscle weakness (generalized)  Difficulty in walking, not elsewhere classified     Problem List Patient Active Problem List   Diagnosis Date Noted  . Hyperlipidemia   . Diarrhea   . Scar 06/19/2019  . Vaginal yeast infection 05/26/2019  . At risk for adverse drug event 05/02/2019  . CHF (congestive heart failure) ([place]) 05/01/2019  . Pressure ulcer of sacral region, stage 2 ([place]) 05/01/2019  . Displaced supracondylar fracture of distal end of right femur without intracondylar extension ([place]) 04/19/2019  . Closed right femoral fracture ([place]) 04/17/2019  . Sleep apnea   . Glaucoma   . Essential hypertension, benign 09/03/2014  . Morbid obesity ([place]) 03/06/2014  . HTN (hypertension) 03/06/2014  . Vertigo 03/06/2014  . Type 2 diabetes mellitus ([place]) 03/06/2014  . Pure hypercholesterolemia 03/06/2014    Emarion Toral,ANGIE PTA 01/27/2021, 1:50 PM  [place]. [place], [place], [address] Phone: [phone]   Fax:  [phone]  Name: Jessica [name] MRN: [id number] Date of Birth: 1946/03/28

## 2021-01-29 ENCOUNTER — Encounter: Payer: Self-pay | Admitting: Physical Therapy

## 2021-01-29 ENCOUNTER — Ambulatory Visit: Payer: Medicare Other | Admitting: Physical Therapy

## 2021-01-29 ENCOUNTER — Other Ambulatory Visit: Payer: Self-pay

## 2021-01-29 DIAGNOSIS — M545 Low back pain, unspecified: Secondary | ICD-10-CM | POA: Diagnosis not present

## 2021-01-29 DIAGNOSIS — M6281 Muscle weakness (generalized): Secondary | ICD-10-CM | POA: Diagnosis not present

## 2021-01-29 DIAGNOSIS — M25561 Pain in right knee: Secondary | ICD-10-CM

## 2021-01-29 DIAGNOSIS — R42 Dizziness and giddiness: Secondary | ICD-10-CM

## 2021-01-29 DIAGNOSIS — R262 Difficulty in walking, not elsewhere classified: Secondary | ICD-10-CM

## 2021-01-29 DIAGNOSIS — G8929 Other chronic pain: Secondary | ICD-10-CM

## 2021-01-29 NOTE — Therapy (Signed)
[place]. [place], [place], [address] Phone: [phone]   Fax:  [phone]  Physical Therapy Treatment  Patient Details  Name: Jessica [name] MRN: [id number] Date of Birth: 1946-03-22 Referring Provider (PT): Addison Lank   Encounter Date: 01/29/2021   PT End of Session - 01/29/21 1144    Visit Number 12    Date for PT Re-Evaluation 02/06/21    PT Start Time 1100    PT Stop Time 1145    PT Time Calculation (min) 45 min    Activity Tolerance Patient tolerated treatment well    Behavior During Therapy [facility] for tasks assessed/performed           Past Medical History:  Diagnosis Date  . Anemia   . Anxiety   . Arthritis   . Cataract   . Depression   . Diabetes mellitus without complication ([place])   . Diastolic dysfunction   . Frozen shoulder   . Glaucoma   . Headache    History of migraines  . Heart murmur   . History of kidney stones   . Hyperlipidemia   . Hypertension   . Macular degeneration   . Neuropathy   . Obesity   . Sleep apnea   . Stroke [facility])    has been told by a Dr. Mort Sawyers that she may have had a mini stroke but no further testing was done to confirm  . Tachycardia     Past Surgical History:  Procedure Laterality Date  . CARDIAC CATHETERIZATION     -6/09-normal ejection fraction 65%, no angiographically significant CAD., M. Skains MD  . CESAREAN SECTION    . COLONOSCOPY    . foot spurs removed    . HYSTEROSCOPY WITH D & C N/A 06/28/2017   Procedure: DILATATION AND CURETTAGE /HYSTEROSCOPY/POLYPECTOMY WITH MYOSURE;  Surgeon: Christophe Louis, MD;  Location: [facility];  Service: Gynecology;  Laterality: N/A;  . JOINT REPLACEMENT     right knee replaced  . knee spurs removed    . ORIF FEMUR FRACTURE Right 04/19/2019   Procedure: OPEN REDUCTION INTERNAL FIXATION (ORIF) DISTAL FEMUR FRACTURE;  Surgeon: Rod Can, MD;  Location: [facility];  Service: Orthopedics;  Laterality: Right;  .  shoulder spurs removed    . TONSILLECTOMY AND ADENOIDECTOMY    . UPPER GI ENDOSCOPY      There were no vitals filed for this visit.   Subjective Assessment - 01/29/21 1059    Subjective "I feel pretty good"  the stretching help, and putting Rolator away differently    Currently in Pain? Yes    Pain Score 1     Pain Location Leg    Pain Orientation Right                             [facility] Adult PT Treatment/Exercise - 01/29/21 0001      Ambulation/Gait   Gait Comments ambulated 33ft x2 55ft feet in clinic w/ staright cane, step through pattern      Knee/Hip Exercises: Aerobic   Nustep L6 x 7 min      Knee/Hip Exercises: Standing   Other Standing Knee Exercises marching, hip EXT, hip ABD BL LE  2lb w/ walker - 2x10    Other Standing Knee Exercises Stanidng rows & ext green 2x15      Knee/Hip Exercises: Seated   Long Arc Quad Both;2 sets;15 reps  Long Arc Quad Weight 3 lbs.    Sit to Sand 2 sets;10 reps;without UE support   w/ OHP yellow ball                   PT Short Term Goals - 01/22/21 1140      PT SHORT TERM GOAL #1   Title independent with intial HEP    Status Achieved             PT Long Term Goals - 01/20/21 1129      PT LONG TERM GOAL #3   Title decrease TUG time to 14 seconds    Status On-going   22 sec                Plan - 01/29/21 1145    Clinical Impression Statement Pt able to progress increasing her gait distance and overall tolerance with SPC. Some instability with standing rows, ces not to rely on tband to maintain balance. No report of pain, cues to prevent postural lean  needed with standing hip exercises.    Personal Factors and Comorbidities Comorbidity 3+    Comorbidities anxiety, depression, DM, anemia, obesity, CVA, RC repair, TKA, ORIF of the right femur and left tibia    Examination-Activity Limitations Bathing    Examination-Participation Restrictions Meal Prep;Cleaning;Community  Activity;Shop;Driving;Laundry    Stability/Clinical Decision Making Evolving/Moderate complexity    Rehab Potential Good    PT Frequency 2x / week    PT Duration 8 weeks    PT Treatment/Interventions ADLs/Self Care Home Management;Cryotherapy;Electrical Stimulation;Moist Heat;Gait training;Neuromuscular re-education;Balance training;Therapeutic exercise;Therapeutic activities;Functional mobility training;Stair training;Patient/family education;Manual techniques    PT Next Visit Plan strength and balance for increased safety           Patient will benefit from skilled therapeutic intervention in order to improve the following deficits and impairments:  Abnormal gait,Decreased range of motion,Difficulty walking,Cardiopulmonary status limiting activity,Decreased endurance,Decreased activity tolerance,Pain,Improper body mechanics,Decreased balance,Decreased mobility,Decreased strength,Postural dysfunction  Visit Diagnosis: Difficulty in walking, not elsewhere classified  Chronic pain of right knee  Muscle weakness (generalized)  Chronic bilateral low back pain without sciatica  Dizziness and giddiness     Problem List Patient Active Problem List   Diagnosis Date Noted  . Hyperlipidemia   . Diarrhea   . Scar 06/19/2019  . Vaginal yeast infection 05/26/2019  . At risk for adverse drug event 05/02/2019  . CHF (congestive heart failure) ([place]) 05/01/2019  . Pressure ulcer of sacral region, stage 2 ([place]) 05/01/2019  . Displaced supracondylar fracture of distal end of right femur without intracondylar extension ([place]) 04/19/2019  . Closed right femoral fracture ([place]) 04/17/2019  . Sleep apnea   . Glaucoma   . Essential hypertension, benign 09/03/2014  . Morbid obesity ([place]) 03/06/2014  . HTN (hypertension) 03/06/2014  . Vertigo 03/06/2014  . Type 2 diabetes mellitus ([place]) 03/06/2014  . Pure hypercholesterolemia 03/06/2014    Scot Jun, PTA 01/29/2021, 11:46 AM  [place]. [place], [place], [address] Phone: [phone]   Fax:  [phone]  Name: Jessica [name] MRN: [id number] Date of Birth: Aug 13, 1946

## 2021-02-02 DIAGNOSIS — R9389 Abnormal findings on diagnostic imaging of other specified body structures: Secondary | ICD-10-CM | POA: Diagnosis not present

## 2021-02-02 DIAGNOSIS — N950 Postmenopausal bleeding: Secondary | ICD-10-CM | POA: Diagnosis not present

## 2021-02-03 ENCOUNTER — Ambulatory Visit: Payer: Medicare Other | Admitting: Physical Therapy

## 2021-02-05 ENCOUNTER — Encounter: Payer: Self-pay | Admitting: Physical Therapy

## 2021-02-05 ENCOUNTER — Other Ambulatory Visit: Payer: Self-pay

## 2021-02-05 ENCOUNTER — Ambulatory Visit: Payer: Medicare Other | Admitting: Physical Therapy

## 2021-02-05 DIAGNOSIS — G8929 Other chronic pain: Secondary | ICD-10-CM

## 2021-02-05 DIAGNOSIS — M6281 Muscle weakness (generalized): Secondary | ICD-10-CM

## 2021-02-05 DIAGNOSIS — R262 Difficulty in walking, not elsewhere classified: Secondary | ICD-10-CM

## 2021-02-05 DIAGNOSIS — M25561 Pain in right knee: Secondary | ICD-10-CM | POA: Diagnosis not present

## 2021-02-05 DIAGNOSIS — R42 Dizziness and giddiness: Secondary | ICD-10-CM | POA: Diagnosis not present

## 2021-02-05 DIAGNOSIS — M545 Low back pain, unspecified: Secondary | ICD-10-CM | POA: Diagnosis not present

## 2021-02-05 NOTE — Therapy (Signed)
[place]. [place], [place], [address] Phone: [phone]   Fax:  [phone]  Physical Therapy Treatment  Patient Details  Name: Jessica [name] MRN: [id number] Date of Birth: [birth year] Referring Provider (PT): Addison Lank   Encounter Date: 02/05/2021   PT End of Session - 02/05/21 1139    Visit Number 13    Date for PT Re-Evaluation 02/06/21    PT Start Time 1100    PT Stop Time 1142    PT Time Calculation (min) 42 min    Behavior During Therapy [facility] for tasks assessed/performed           Past Medical History:  Diagnosis Date  . Anemia   . Anxiety   . Arthritis   . Cataract   . Depression   . Diabetes mellitus without complication ([place])   . Diastolic dysfunction   . Frozen shoulder   . Glaucoma   . Headache    History of migraines  . Heart murmur   . History of kidney stones   . Hyperlipidemia   . Hypertension   . Macular degeneration   . Neuropathy   . Obesity   . Sleep apnea   . Stroke [facility])    has been told by a Dr. Mort Sawyers that she may have had a mini stroke but no further testing was done to confirm  . Tachycardia     Past Surgical History:  Procedure Laterality Date  . CARDIAC CATHETERIZATION     -6/09-normal ejection fraction 65%, no angiographically significant CAD., M. Skains MD  . CESAREAN SECTION    . COLONOSCOPY    . foot spurs removed    . HYSTEROSCOPY WITH D & C N/A 06/28/2017   Procedure: DILATATION AND CURETTAGE /HYSTEROSCOPY/POLYPECTOMY WITH MYOSURE;  Surgeon: Christophe Louis, MD;  Location: [facility];  Service: Gynecology;  Laterality: N/A;  . JOINT REPLACEMENT     right knee replaced  . knee spurs removed    . ORIF FEMUR FRACTURE Right 04/19/2019   Procedure: OPEN REDUCTION INTERNAL FIXATION (ORIF) DISTAL FEMUR FRACTURE;  Surgeon: Rod Can, MD;  Location: [facility];  Service: Orthopedics;  Laterality: Right;  . shoulder spurs removed    . TONSILLECTOMY AND ADENOIDECTOMY     . UPPER GI ENDOSCOPY      There were no vitals filed for this visit.   Subjective Assessment - 02/05/21 1058    Subjective Pretty good    Currently in Pain? Yes    Pain Score 1     Pain Location Leg    Pain Orientation Right              [facility] PT Assessment - 02/05/21 0001      Strength   Strength Assessment Site Knee    Right Knee Flexion 4+/5    Right Knee Extension 4+/5    Left Knee Flexion 5/5    Left Knee Extension 5/5      Timed Up and Go Test   Normal TUG (seconds) 15.7                         [facility] Adult PT Treatment/Exercise - 02/05/21 0001      Knee/Hip Exercises: Aerobic   Nustep L6 x 7 min      Knee/Hip Exercises: Standing   Other Standing Knee Exercises marching, hip EXT, hip ABD BL LE  3lb w/ walker - 2x10    Other  Standing Knee Exercises Stanidng rows & ext blue 2x15      Knee/Hip Exercises: Seated   Long Arc Quad Both;2 sets;15 reps    Long Arc Quad Weight 3 lbs.    Sit to Sand 2 sets;10 reps;without UE support   OHP w/ yellow ball                   PT Short Term Goals - 01/22/21 1140      PT SHORT TERM GOAL #1   Title independent with intial HEP    Status Achieved             PT Long Term Goals - 02/05/21 1119      PT LONG TERM GOAL #3   Title decrease TUG time to 14 seconds    Status Partially Met   15.74                Plan - 02/05/21 1140    Clinical Impression Statement Pt has progressed increasing her LE strength. She has also decrease her TUG time but did not meet goal. No reports of pain throughout session. Cues not to rely on tband to help maintain balance with standing rows and extensions. Cue needed not to lean forward with hip extensions.    Personal Factors and Comorbidities Comorbidity 3+    Comorbidities anxiety, depression, DM, anemia, obesity, CVA, RC repair, TKA, ORIF of the right femur and left tibia    Examination-Activity Limitations Bathing    Examination-Participation  Restrictions Meal Prep;Cleaning;Community Activity;Shop;Driving;Laundry    Stability/Clinical Decision Making Evolving/Moderate complexity    Rehab Potential Good    PT Frequency 2x / week    PT Duration 8 weeks    PT Treatment/Interventions ADLs/Self Care Home Management;Cryotherapy;Electrical Stimulation;Moist Heat;Gait training;Neuromuscular re-education;Balance training;Therapeutic exercise;Therapeutic activities;Functional mobility training;Stair training;Patient/family education;Manual techniques    PT Next Visit Plan strength and balance for increased safety           Patient will benefit from skilled therapeutic intervention in order to improve the following deficits and impairments:     Visit Diagnosis: Muscle weakness (generalized)  Difficulty in walking, not elsewhere classified  Chronic bilateral low back pain without sciatica  Chronic pain of right knee     Problem List Patient Active Problem List   Diagnosis Date Noted  . Hyperlipidemia   . Diarrhea   . Scar 06/19/2019  . Vaginal yeast infection 05/26/2019  . At risk for adverse drug event 05/02/2019  . CHF (congestive heart failure) ([place]) 05/01/2019  . Pressure ulcer of sacral region, stage 2 ([place]) 05/01/2019  . Displaced supracondylar fracture of distal end of right femur without intracondylar extension ([place]) 04/19/2019  . Closed right femoral fracture ([place]) 04/17/2019  . Sleep apnea   . Glaucoma   . Essential hypertension, benign 09/03/2014  . Morbid obesity ([place]) 03/06/2014  . HTN (hypertension) 03/06/2014  . Vertigo 03/06/2014  . Type 2 diabetes mellitus ([place]) 03/06/2014  . Pure hypercholesterolemia 03/06/2014    Scot Jun, PTA 02/05/2021, 11:43 AM  [place]. [place], [place], [address] Phone: [phone]   Fax:  [phone]  Name: BRITTENY FIEBELKORN MRN: [id number] Date of Birth: 01-16-1946

## 2021-02-09 DIAGNOSIS — N950 Postmenopausal bleeding: Secondary | ICD-10-CM | POA: Diagnosis not present

## 2021-02-09 DIAGNOSIS — N3946 Mixed incontinence: Secondary | ICD-10-CM | POA: Diagnosis not present

## 2021-02-09 DIAGNOSIS — E782 Mixed hyperlipidemia: Secondary | ICD-10-CM | POA: Diagnosis not present

## 2021-02-09 DIAGNOSIS — G4733 Obstructive sleep apnea (adult) (pediatric): Secondary | ICD-10-CM | POA: Diagnosis not present

## 2021-02-09 DIAGNOSIS — I10 Essential (primary) hypertension: Secondary | ICD-10-CM | POA: Diagnosis not present

## 2021-02-13 ENCOUNTER — Ambulatory Visit: Payer: Medicare Other | Admitting: Physical Therapy

## 2021-02-13 ENCOUNTER — Other Ambulatory Visit: Payer: Self-pay

## 2021-02-13 DIAGNOSIS — R42 Dizziness and giddiness: Secondary | ICD-10-CM | POA: Diagnosis not present

## 2021-02-13 DIAGNOSIS — G8929 Other chronic pain: Secondary | ICD-10-CM

## 2021-02-13 DIAGNOSIS — R262 Difficulty in walking, not elsewhere classified: Secondary | ICD-10-CM

## 2021-02-13 DIAGNOSIS — M25561 Pain in right knee: Secondary | ICD-10-CM | POA: Diagnosis not present

## 2021-02-13 DIAGNOSIS — M6281 Muscle weakness (generalized): Secondary | ICD-10-CM | POA: Diagnosis not present

## 2021-02-13 DIAGNOSIS — M545 Low back pain, unspecified: Secondary | ICD-10-CM | POA: Diagnosis not present

## 2021-02-13 NOTE — Therapy (Signed)
[place]. [place], [place], [address] Phone: [phone]   Fax:  [phone]  Physical Therapy Treatment  Patient Details  Name: Jessica [name] MRN: [id number] Date of Birth: Mar 28, 1946 Referring Provider (PT): Addison Lank   Encounter Date: 02/13/2021   PT End of Session - 02/13/21 7510    Visit Number 14    Date for PT Re-Evaluation 03/11/21    PT Start Time 0930    PT Stop Time 2585    PT Time Calculation (min) 44 min    Activity Tolerance Patient tolerated treatment well    Behavior During Therapy [facility] for tasks assessed/performed           Past Medical History:  Diagnosis Date  . Anemia   . Anxiety   . Arthritis   . Cataract   . Depression   . Diabetes mellitus without complication ([place])   . Diastolic dysfunction   . Frozen shoulder   . Glaucoma   . Headache    History of migraines  . Heart murmur   . History of kidney stones   . Hyperlipidemia   . Hypertension   . Macular degeneration   . Neuropathy   . Obesity   . Sleep apnea   . Stroke [facility])    has been told by a Dr. Mort Sawyers that she may have had a mini stroke but no further testing was done to confirm  . Tachycardia     Past Surgical History:  Procedure Laterality Date  . CARDIAC CATHETERIZATION     -6/09-normal ejection fraction 65%, no angiographically significant CAD., M. Skains MD  . CESAREAN SECTION    . COLONOSCOPY    . foot spurs removed    . HYSTEROSCOPY WITH D & C N/A 06/28/2017   Procedure: DILATATION AND CURETTAGE /HYSTEROSCOPY/POLYPECTOMY WITH MYOSURE;  Surgeon: Christophe Louis, MD;  Location: [facility];  Service: Gynecology;  Laterality: N/A;  . JOINT REPLACEMENT     right knee replaced  . knee spurs removed    . ORIF FEMUR FRACTURE Right 04/19/2019   Procedure: OPEN REDUCTION INTERNAL FIXATION (ORIF) DISTAL FEMUR FRACTURE;  Surgeon: Rod Can, MD;  Location: [facility];  Service: Orthopedics;  Laterality: Right;  .  shoulder spurs removed    . TONSILLECTOMY AND ADENOIDECTOMY    . UPPER GI ENDOSCOPY      There were no vitals filed for this visit.   Subjective Assessment - 02/13/21 0932    Subjective Did a lot of walking yesterday, but feeling good today.    Currently in Pain? Yes    Pain Location Leg    Pain Orientation Right;Lateral              [facility] PT Assessment - 02/13/21 0001      Standardized Balance Assessment   Standardized Balance Assessment Timed Up and Go Test      Timed Up and Go Test   Normal TUG (seconds) 21    TUG Comments with rollator                         [facility] Adult PT Treatment/Exercise - 02/13/21 0001      Knee/Hip Exercises: Aerobic   Other Aerobic UBE L2 67fd/2bck      Knee/Hip Exercises: Standing   Other Standing Knee Exercises marching, hip EXT, hip ABD BL LE  3lb w/ rollator - 2x10    Other Standing Knee Exercises Stanidng rows &  ext blue 2x15      Knee/Hip Exercises: Seated   Long Arc Quad Both;2 sets;15 reps    Long Arc Quad Weight 3 lbs.    Sit to Sand 2 sets;10 reps;without UE support   w/ yellow ball CP+OHP                   PT Short Term Goals - 01/22/21 1140      PT SHORT TERM GOAL #1   Title independent with intial HEP    Status Achieved             PT Long Term Goals - 02/13/21 1018      PT LONG TERM GOAL #2   Title decrease back and knee pain 25%    Status Partially Met      PT LONG TERM GOAL #3   Title decrease TUG time to 14 seconds    Time 21    Status On-going      PT LONG TERM GOAL #5   Title walk 300 feet with SPC    Status On-going                 Plan - 02/13/21 1014    Clinical Impression Statement Pt tolerated treatment well with no reports of increased pain during session. Some fatigue noted with compensations during standing LE TE. She stated she walked a lot yesterday, and was unable to meet TUG goal. Verbal and tactile cues needed during standing LE TE for erect posture and  to avoid compensations.    Personal Factors and Comorbidities Comorbidity 3+    Comorbidities anxiety, depression, DM, anemia, obesity, CVA, RC repair, TKA, ORIF of the right femur and left tibia    Examination-Participation Restrictions Meal Prep;Cleaning;Community Activity;Shop;Driving;Laundry    Stability/Clinical Decision Making Evolving/Moderate complexity    Rehab Potential Good    PT Frequency 2x / week    PT Duration 8 weeks    PT Treatment/Interventions ADLs/Self Care Home Management;Cryotherapy;Electrical Stimulation;Moist Heat;Gait training;Neuromuscular re-education;Balance training;Therapeutic exercise;Therapeutic activities;Functional mobility training;Stair training;Patient/family education;Manual techniques    PT Next Visit Plan continue strength and balance for increased safety           Patient will benefit from skilled therapeutic intervention in order to improve the following deficits and impairments:  Abnormal gait,Decreased range of motion,Difficulty walking,Cardiopulmonary status limiting activity,Decreased endurance,Decreased activity tolerance,Pain,Improper body mechanics,Decreased balance,Decreased mobility,Decreased strength,Postural dysfunction  Visit Diagnosis: Muscle weakness (generalized)  Difficulty in walking, not elsewhere classified  Chronic pain of right knee     Problem List Patient Active Problem List   Diagnosis Date Noted  . Hyperlipidemia   . Diarrhea   . Scar 06/19/2019  . Vaginal yeast infection 05/26/2019  . At risk for adverse drug event 05/02/2019  . CHF (congestive heart failure) ([place]) 05/01/2019  . Pressure ulcer of sacral region, stage 2 ([place]) 05/01/2019  . Displaced supracondylar fracture of distal end of right femur without intracondylar extension ([place]) 04/19/2019  . Closed right femoral fracture ([place]) 04/17/2019  . Sleep apnea   . Glaucoma   . Essential hypertension, benign 09/03/2014  . Morbid obesity ([place]) 03/06/2014  .  HTN (hypertension) 03/06/2014  . Vertigo 03/06/2014  . Type 2 diabetes mellitus ([place]) 03/06/2014  . Pure hypercholesterolemia 03/06/2014    Ernst Spell, SPTA 02/13/2021, 10:18 AM  [place]. [place], [place], [address] Phone: [phone]   Fax:  [phone]  Name: BESSYE STITH MRN: [id number]  Date of Birth: Apr 01, 1946

## 2021-02-15 ENCOUNTER — Encounter (HOSPITAL_COMMUNITY): Payer: Self-pay

## 2021-02-15 ENCOUNTER — Other Ambulatory Visit: Payer: Self-pay

## 2021-02-15 ENCOUNTER — Emergency Department (HOSPITAL_COMMUNITY): Payer: Medicare Other

## 2021-02-15 ENCOUNTER — Emergency Department (HOSPITAL_COMMUNITY)
Admission: EM | Admit: 2021-02-15 | Discharge: 2021-02-15 | Disposition: A | Discharge status: Home / Self Care | Payer: Medicare Other | Attending: Emergency Medicine | Admitting: Emergency Medicine

## 2021-02-15 DIAGNOSIS — Z7984 Long term (current) use of oral hypoglycemic drugs: Secondary | ICD-10-CM | POA: Insufficient documentation

## 2021-02-15 DIAGNOSIS — E119 Type 2 diabetes mellitus without complications: Secondary | ICD-10-CM | POA: Diagnosis not present

## 2021-02-15 DIAGNOSIS — W010XXA Fall on same level from slipping, tripping and stumbling without subsequent striking against object, initial encounter: Secondary | ICD-10-CM | POA: Diagnosis not present

## 2021-02-15 DIAGNOSIS — Y92090 Kitchen in other non-institutional residence as the place of occurrence of the external cause: Secondary | ICD-10-CM | POA: Insufficient documentation

## 2021-02-15 DIAGNOSIS — M25561 Pain in right knee: Secondary | ICD-10-CM | POA: Diagnosis not present

## 2021-02-15 DIAGNOSIS — Z79899 Other long term (current) drug therapy: Secondary | ICD-10-CM | POA: Insufficient documentation

## 2021-02-15 DIAGNOSIS — I110 Hypertensive heart disease with heart failure: Secondary | ICD-10-CM | POA: Insufficient documentation

## 2021-02-15 DIAGNOSIS — M1712 Unilateral primary osteoarthritis, left knee: Secondary | ICD-10-CM | POA: Diagnosis not present

## 2021-02-15 DIAGNOSIS — I509 Heart failure, unspecified: Secondary | ICD-10-CM | POA: Diagnosis not present

## 2021-02-15 DIAGNOSIS — Z96651 Presence of right artificial knee joint: Secondary | ICD-10-CM | POA: Diagnosis not present

## 2021-02-15 DIAGNOSIS — S99921A Unspecified injury of right foot, initial encounter: Secondary | ICD-10-CM | POA: Diagnosis present

## 2021-02-15 DIAGNOSIS — R0902 Hypoxemia: Secondary | ICD-10-CM | POA: Diagnosis not present

## 2021-02-15 DIAGNOSIS — S90111A Contusion of right great toe without damage to nail, initial encounter: Secondary | ICD-10-CM | POA: Insufficient documentation

## 2021-02-15 DIAGNOSIS — R279 Unspecified lack of coordination: Secondary | ICD-10-CM | POA: Diagnosis not present

## 2021-02-15 DIAGNOSIS — Z743 Need for continuous supervision: Secondary | ICD-10-CM | POA: Diagnosis not present

## 2021-02-15 DIAGNOSIS — M7989 Other specified soft tissue disorders: Secondary | ICD-10-CM | POA: Diagnosis not present

## 2021-02-15 DIAGNOSIS — Z794 Long term (current) use of insulin: Secondary | ICD-10-CM | POA: Diagnosis not present

## 2021-02-15 DIAGNOSIS — M79674 Pain in right toe(s): Secondary | ICD-10-CM | POA: Diagnosis not present

## 2021-02-15 DIAGNOSIS — M25571 Pain in right ankle and joints of right foot: Secondary | ICD-10-CM | POA: Diagnosis not present

## 2021-02-15 DIAGNOSIS — I959 Hypotension, unspecified: Secondary | ICD-10-CM | POA: Diagnosis not present

## 2021-02-15 DIAGNOSIS — W19XXXA Unspecified fall, initial encounter: Secondary | ICD-10-CM

## 2021-02-15 LAB — CBG MONITORING, ED: Glucose-Capillary: 107 mg/dL — ABNORMAL HIGH (ref 70–99)

## 2021-02-15 MED ORDER — HYDROCODONE-ACETAMINOPHEN 5-325 MG PO TABS: 1.0000 | ORAL_TABLET | Freq: Once | ORAL | Status: DC

## 2021-02-15 NOTE — Discharge Instructions (Addendum)
Your xrays did not show any acute findings.  You may alternate Tylenol or ibuprofen to help with your pain.  Follow-up with your primary care physician as needed.

## 2021-02-15 NOTE — ED Notes (Signed)
[facility] called for transport.  

## 2021-02-15 NOTE — ED Triage Notes (Signed)
EMS reports from home, spilled cooking oil on floor had accidental slip and fall onto right knee. Hx of R knee replacement 2009. Pt states pain subsiding. No obvious injury, no blood thinner, no head strike or LOC. Pt states she can move knee without pain.  BP 134/60 HR 84 RR 16 Sp02 97 RA CBG 193

## 2021-02-15 NOTE — ED Provider Notes (Signed)
[facility] DEPT Provider Note   CSN: [id number] Arrival date & time: 02/15/21  1759     History Chief Complaint  Patient presents with  . Fall  . Knee Pain    Jessica [name] is a 75 y.o. female.  75 y.o female with a PMH of Anemia, Anxiety, DM, RIGHT knee replacement presents to the ED via EMS s/p mechanical fall 1 hour prior to arrival. Patient reports she was frying an egg, when she attempted to remove the now cold oil from the pan onto her dishwasher, reports she dropped some of the oil on the floor, causing her to slip and fall onto the right knee.  States she was using her walker when this occurred, walker ended up falling on top of her, she seek attention via EMS.  She was assisted onto the stretcher by EMS, has not weight-bear since the incident.  She reports most of the pain along the right knee, right foot exacerbated with palpation and knee flexion.  Does have a prior history of a right knee replacement by Dr. Lyla Glassing, also a right femoral ORIF.  She denies striking her head, no loss of consciousness.  Denies any chest pain, hurts of breath, abdominal pain.  Patient is currently not on any blood thinners.  The history is provided by the patient and medical records.       Past Medical History:  Diagnosis Date  . Anemia   . Anxiety   . Arthritis   . Cataract   . Depression   . Diabetes mellitus without complication ([place])   . Diastolic dysfunction   . Frozen shoulder   . Glaucoma   . Headache    History of migraines  . Heart murmur   . History of kidney stones   . Hyperlipidemia   . Hypertension   . Macular degeneration   . Neuropathy   . Obesity   . Sleep apnea   . Stroke [facility])    has been told by a Dr. Mort Sawyers that she may have had a mini stroke but no further testing was done to confirm  . Tachycardia     Patient Active Problem List   Diagnosis Date Noted  . Hyperlipidemia   . Diarrhea   . Scar 06/19/2019  .  Vaginal yeast infection 05/26/2019  . At risk for adverse drug event 05/02/2019  . CHF (congestive heart failure) ([place]) 05/01/2019  . Pressure ulcer of sacral region, stage 2 ([place]) 05/01/2019  . Displaced supracondylar fracture of distal end of right femur without intracondylar extension ([place]) 04/19/2019  . Closed right femoral fracture ([place]) 04/17/2019  . Sleep apnea   . Glaucoma   . Essential hypertension, benign 09/03/2014  . Morbid obesity ([place]) 03/06/2014  . HTN (hypertension) 03/06/2014  . Vertigo 03/06/2014  . Type 2 diabetes mellitus ([place]) 03/06/2014  . Pure hypercholesterolemia 03/06/2014    Past Surgical History:  Procedure Laterality Date  . CARDIAC CATHETERIZATION     -6/09-normal ejection fraction 65%, no angiographically significant CAD., M. Skains MD  . CESAREAN SECTION    . COLONOSCOPY    . foot spurs removed    . HYSTEROSCOPY WITH D & C N/A 06/28/2017   Procedure: DILATATION AND CURETTAGE /HYSTEROSCOPY/POLYPECTOMY WITH MYOSURE;  Surgeon: Christophe Louis, MD;  Location: [facility];  Service: Gynecology;  Laterality: N/A;  . JOINT REPLACEMENT     right knee replaced  . knee spurs removed    . ORIF FEMUR FRACTURE Right 04/19/2019  Procedure: OPEN REDUCTION INTERNAL FIXATION (ORIF) DISTAL FEMUR FRACTURE;  Surgeon: Rod Can, MD;  Location: [facility];  Service: Orthopedics;  Laterality: Right;  . shoulder spurs removed    . TONSILLECTOMY AND ADENOIDECTOMY    . UPPER GI ENDOSCOPY       OB History   No obstetric history on file.     Family History  Problem Relation Age of Onset  . Colon polyps Father   . Brain cancer Mother     Social History   Tobacco Use  . Smoking status: Never Smoker  . Smokeless tobacco: Never Used  Vaping Use  . Vaping Use: Never used  Substance Use Topics  . Alcohol use: No    Comment: maybe once a year  . Drug use: No    Home Medications Prior to Admission medications   Medication Sig Start Date End Date Taking? Authorizing  Provider  acetaminophen (TYLENOL) 325 MG tablet Take 2 tablets (650 mg total) by mouth every 6 (six) hours as needed for mild pain. Do not take more than 4000mg  of tylenol per day 07/26/19   Medina-Vargas, Monina C, NP  acetaminophen (TYLENOL) 650 MG CR tablet Take 650 mg by mouth every 8 (eight) hours as needed for pain.    [provider]  B-D INS SYRINGE 0.5CC/31GX5/16 31G X 5/16" 0.5 ML MISC Use as directed per provider. 06/11/13   [provider]  Cholecalciferol (VITAMIN D3) 50 MCG (2000 UT) capsule Take 2,000 Units by mouth daily.    [provider]  Coenzyme Q10 (CO Q-10) 100 MG CAPS Take 100 mg by mouth at bedtime.     [provider]  dorzolamidel-timolol (COSOPT) 22.3-6.8 MG/ML SOLN ophthalmic solution Place 1 drop into both eyes 2 (two) times daily.    [provider]  furosemide (LASIX) 20 MG tablet Take 1 tablet (20 mg total) by mouth daily. 11/03/20   Jerline Pain, MD  HYDROcodone-acetaminophen (NORCO/VICODIN) 5-325 MG tablet Take 1 tablet by mouth every 6 (six) hours as needed. 08/13/20   Dorie Rank, MD  insulin lispro (HUMALOG) 100 UNIT/ML injection Insulin pump Patient taking differently: Inject 3 Units into the skin continuous. Insulin pump. Maximum daily dose - 200 units 07/26/19   Medina-Vargas, Monina C, NP  INVOKANA 300 MG TABS tablet Take 1 tablet (300 mg total) by mouth daily. 07/26/19   Medina-Vargas, Monina C, NP  Iron-Vitamin C (VITRON-C) 65-125 MG TABS Take 1 tablet by mouth at bedtime.     [provider]  losartan (COZAAR) 100 MG tablet TAKE 1 TABLET DAILY 01/13/21   Jerline Pain, MD  melatonin 5 MG TABS Take 5 mg by mouth at bedtime.    [provider]  metFORMIN (GLUCOPHAGE-XR) 500 MG 24 hr tablet Take 1 tablet (500 mg total) by mouth at bedtime. 07/26/19   Medina-Vargas, Monina C, NP  metoprolol tartrate (LOPRESSOR) 100 MG tablet TAKE 1 TABLET TWICE A DAY 01/13/21   Jerline Pain, MD  Multiple  Vitamins-Minerals (PRESERVISION AREDS 2 PO) Take 1 tablet by mouth 2 (two) times daily.    [provider]  ONE TOUCH ULTRA TEST test strip 1 each by Other route as needed (Use as directed per provider).  08/05/14   [provider]  oxybutynin (DITROPAN-XL) 5 MG 24 hr tablet Take 5 mg by mouth at bedtime.    [provider]  simvastatin (ZOCOR) 40 MG tablet Take 1 tablet (40 mg total) by mouth every evening. 07/26/19  Medina-Vargas, Monina C, NP  spironolactone (ALDACTONE) 25 MG tablet TAKE 1 TABLET DAILY 11/24/20   Jerline Pain, MD  Travoprost, BAK Free, (TRAVATAN) 0.004 % SOLN ophthalmic solution Place 1 drop into both eyes at bedtime.     [provider]    Allergies    Meloxicam, Percocet [oxycodone-acetaminophen], Tramadol, Vicodin [hydrocodone-acetaminophen], Azithromycin, Iodine, and Shellfish allergy  Review of Systems   Review of Systems  Constitutional: Negative for fever.  Musculoskeletal: Positive for arthralgias and myalgias.    Physical Exam Updated Vital Signs BP 116/73   Pulse 85   Temp 98.7 F (37.1 C) (Oral)   Resp 18   LMP  (LMP Unknown)   SpO2 94%   Physical Exam Vitals and nursing note reviewed.  Constitutional:      Appearance: Normal appearance.  HENT:     Head: Normocephalic and atraumatic.     Mouth/Throat:     Mouth: Mucous membranes are moist.  Eyes:     Pupils: Pupils are equal, round, and reactive to light.  Cardiovascular:     Rate and Rhythm: Normal rate.  Pulmonary:     Effort: Pulmonary effort is normal.  Abdominal:     General: Abdomen is flat.  Musculoskeletal:        General: Swelling and tenderness present.     Cervical back: Normal range of motion and neck supple.     Right lower leg: Tenderness and bony tenderness present. No swelling, deformity or lacerations. No edema.     Left lower leg: No swelling. No edema.       Legs:     Comments: Limited ROM due to pain especially with right knee  flexion.  Pain with palpation throughout.  Mild swelling noted to bilateral shins.  Pulses are present, swelling along with bruising noted to the right great toe.  Skin:    General: Skin is warm and dry.  Neurological:     Mental Status: She is alert and oriented to person, place, and time.     ED Results / Procedures / Treatments   Labs (all labs ordered are listed, but only abnormal results are displayed) Labs Reviewed - No data to display  EKG None  Radiology DG Knee 2 Views Left  Result Date: 02/15/2021 CLINICAL DATA:  Pain after fall EXAM: LEFT KNEE - 1-2 VIEW COMPARISON:  None. FINDINGS: Degenerative changes, particularly in the medial and patellofemoral compartments. No fracture or effusion. IMPRESSION: Degenerative changes.  No fracture or effusion. Electronically Signed   By: Dorise Bullion III M.D   On: 02/15/2021 18:58   DG Knee 2 Views Right  Result Date: 02/15/2021 CLINICAL DATA:  Pain after fall. EXAM: RIGHT KNEE - 1-2 VIEW COMPARISON:  None. FINDINGS: The patient is status post right knee replacement. A plate is affixed to the distal femur with multiple screws as well. Cerclage wires are noted also. Hardware is in good position on provided images. No acute fracture or effusion. IMPRESSION: Surgical hardware is in good position on provided images. No acute fracture or effusion. Electronically Signed   By: Dorise Bullion III M.D   On: 02/15/2021 18:55   DG Foot Complete Right  Result Date: 02/15/2021 CLINICAL DATA:  Fall with knee pain and right great toe pain EXAM: RIGHT FOOT COMPLETE - 3+ VIEW COMPARISON:  None. FINDINGS: There is no evidence of fracture or dislocation. Degenerative change of the dorsal midfoot and distal digits. Calcaneal enthesophytes. Soft tissue swelling about the foot and ankle. IMPRESSION:  Soft tissue swelling without acute fracture or dislocation of the right foot visualized. Electronically Signed   By: Dahlia Bailiff MD   On: 02/15/2021 19:03     Procedures Procedures   Medications Ordered in ED Medications - No data to display  ED Course  I have reviewed the triage vital signs and the nursing notes.  Pertinent labs & imaging results that were available during my care of the patient were reviewed by me and considered in my medical decision making (see chart for details).    MDM Rules/Calculators/A&P   Patient presents to the ED via EMS status post mechanical fall while in the kitchen.  Reports she was assisted by EMS in order to get onto the stretcher.  She has not ambulated on her right knee yet.  She currently ambulates with a walker.  Reports she had a prior right knee replacement done, does report currently doing therapy for her knee pain.  During evaluation she is overall non-ill, nontoxic appearing.  Vitals are within normal limits, there is pain with ranging of the right knee.  Striking her head, no loss of consciousness, currently not on any blood thinners.  Endorses pain with palpation of the right great toe, some bruising noted. Pulses are present and she is neurovascularly intact.  Will obtain plain films to further evaluate injuries.   DG right knee pain showed: Surgical hardware is in good position on provided images. No acute  fracture or effusion.     DG Knee Left: Degenerative changes. No fracture or effusion.   DG Foot right: Soft tissue swelling without acute fracture or dislocation of the  right foot visualized.   These results were discussed at length with the patient.she was offered pain medication, denies needing any of these at this time patient was ambulated with rolling walker.  She is stable for discharge home.  I have discussed case with my attending Dr. Sherry Ruffing, who agrees with plan and management.    Portions of this note were generated with [employer]. Dictation errors may occur despite best attempts at proofreading.  Final Clinical Impression(s) / ED Diagnoses Final  diagnoses:  Fall, initial encounter  Acute pain of right knee    Rx / DC Orders ED Discharge Orders    None       Janeece Fitting, Hershal Coria 02/15/21 2104    Tegeler, Gwenyth Allegra, MD 02/16/21 [phone]

## 2021-02-15 NOTE — ED Notes (Signed)
Patient able to ambulate with rolling walker independently, complaints of some pain with ambulation.

## 2021-02-17 ENCOUNTER — Other Ambulatory Visit: Payer: Self-pay

## 2021-02-17 ENCOUNTER — Encounter: Payer: Self-pay | Admitting: Physical Therapy

## 2021-02-17 ENCOUNTER — Ambulatory Visit: Payer: Medicare Other | Admitting: Physical Therapy

## 2021-02-17 DIAGNOSIS — M6281 Muscle weakness (generalized): Secondary | ICD-10-CM

## 2021-02-17 DIAGNOSIS — G8929 Other chronic pain: Secondary | ICD-10-CM

## 2021-02-17 DIAGNOSIS — M25561 Pain in right knee: Secondary | ICD-10-CM | POA: Diagnosis not present

## 2021-02-17 DIAGNOSIS — M545 Low back pain, unspecified: Secondary | ICD-10-CM | POA: Diagnosis not present

## 2021-02-17 DIAGNOSIS — R262 Difficulty in walking, not elsewhere classified: Secondary | ICD-10-CM

## 2021-02-17 DIAGNOSIS — R42 Dizziness and giddiness: Secondary | ICD-10-CM | POA: Diagnosis not present

## 2021-02-17 NOTE — Therapy (Signed)
[facility] [place], [place], [address] Phone: [phone]   Fax:  [phone]  Physical Therapy Treatment  Patient Details  Name: Jessica [name] MRN: [id number] Date of Birth: June 22, 1946 Referring Provider (PT): Addison Lank   Encounter Date: 02/17/2021   PT End of Session - 02/17/21 1347    Visit Number 15    Date for PT Re-Evaluation 03/11/21    PT Start Time 1300    PT Stop Time 1344    PT Time Calculation (min) 44 min    Activity Tolerance Patient limited by pain    Behavior During Therapy [facility] for tasks assessed/performed           Past Medical History:  Diagnosis Date  . Anemia   . Anxiety   . Arthritis   . Cataract   . Depression   . Diabetes mellitus without complication ([place])   . Diastolic dysfunction   . Frozen shoulder   . Glaucoma   . Headache    History of migraines  . Heart murmur   . History of kidney stones   . Hyperlipidemia   . Hypertension   . Macular degeneration   . Neuropathy   . Obesity   . Sleep apnea   . Stroke [facility])    has been told by a Dr. Mort Sawyers that she may have had a mini stroke but no further testing was done to confirm  . Tachycardia     Past Surgical History:  Procedure Laterality Date  . CARDIAC CATHETERIZATION     -6/09-normal ejection fraction 65%, no angiographically significant CAD., M. Skains MD  . CESAREAN SECTION    . COLONOSCOPY    . foot spurs removed    . HYSTEROSCOPY WITH D & C N/A 06/28/2017   Procedure: DILATATION AND CURETTAGE /HYSTEROSCOPY/POLYPECTOMY WITH MYOSURE;  Surgeon: Christophe Louis, MD;  Location: [facility];  Service: Gynecology;  Laterality: N/A;  . JOINT REPLACEMENT     right knee replaced  . knee spurs removed    . ORIF FEMUR FRACTURE Right 04/19/2019   Procedure: OPEN REDUCTION INTERNAL FIXATION (ORIF) DISTAL FEMUR FRACTURE;  Surgeon: Rod Can, MD;  Location: [facility];  Service: Orthopedics;  Laterality: Right;  . shoulder  spurs removed    . TONSILLECTOMY AND ADENOIDECTOMY    . UPPER GI ENDOSCOPY      There were no vitals filed for this visit.   Subjective Assessment - 02/17/21 1311    Subjective Pt had a fall Sunday, pt reports slipping on cooking oil that she spilled on the floor. Went to ER, X-rays negative    Currently in Pain? Yes    Pain Score 4     Pain Location Knee    Pain Orientation Right                             [facility] Adult PT Treatment/Exercise - 02/17/21 0001      Knee/Hip Exercises: Aerobic   Nustep L5 x 6 min    Other Aerobic UBE L2 74fd      Knee/Hip Exercises: Seated   Long Arc Quad Both;2 sets;15 reps    Long Arc Quad Weight 3 lbs.    Other Seated Knee/Hip Exercises Rows & Ext green 2x15 each    Hamstring Curl Both;Strengthening;15 reps    Hamstring Limitations green tband  PT Short Term Goals - 01/22/21 1140      PT SHORT TERM GOAL #1   Title independent with intial HEP    Status Achieved             PT Long Term Goals - 02/13/21 1018      PT LONG TERM GOAL #2   Title decrease back and knee pain 25%    Status Partially Met      PT LONG TERM GOAL #3   Title decrease TUG time to 14 seconds    Time 21    Status On-going      PT LONG TERM GOAL #5   Title walk 300 feet with SPC    Status On-going                 Plan - 02/17/21 1348    Clinical Impression Statement Pt reports a fall this weekend, falling on her R knee. She has busied toes on her R foot. Standing interventions were limited today due to pt R knee and foot pain. Postural cue needed with seated rows and extensions. Cue sot fully extend LE with LAQ.    Personal Factors and Comorbidities Comorbidity 3+    Comorbidities anxiety, depression, DM, anemia, obesity, CVA, RC repair, TKA, ORIF of the right femur and left tibia    Examination-Activity Limitations Bathing    Examination-Participation Restrictions Meal Prep;Cleaning;Community  Activity;Shop;Driving;Laundry    Stability/Clinical Decision Making Evolving/Moderate complexity    Rehab Potential Good    PT Frequency 2x / week    PT Duration 8 weeks    PT Treatment/Interventions ADLs/Self Care Home Management;Cryotherapy;Electrical Stimulation;Moist Heat;Gait training;Neuromuscular re-education;Balance training;Therapeutic exercise;Therapeutic activities;Functional mobility training;Stair training;Patient/family education;Manual techniques    PT Next Visit Plan continue strength and balance for increased safety           Patient will benefit from skilled therapeutic intervention in order to improve the following deficits and impairments:  Abnormal gait,Decreased range of motion,Difficulty walking,Cardiopulmonary status limiting activity,Decreased endurance,Decreased activity tolerance,Pain,Improper body mechanics,Decreased balance,Decreased mobility,Decreased strength,Postural dysfunction  Visit Diagnosis: Difficulty in walking, not elsewhere classified  Muscle weakness (generalized)  Chronic pain of right knee  Chronic bilateral low back pain without sciatica     Problem List Patient Active Problem List   Diagnosis Date Noted  . Hyperlipidemia   . Diarrhea   . Scar 06/19/2019  . Vaginal yeast infection 05/26/2019  . At risk for adverse drug event 05/02/2019  . CHF (congestive heart failure) ([place]) 05/01/2019  . Pressure ulcer of sacral region, stage 2 ([place]) 05/01/2019  . Displaced supracondylar fracture of distal end of right femur without intracondylar extension ([place]) 04/19/2019  . Closed right femoral fracture ([place]) 04/17/2019  . Sleep apnea   . Glaucoma   . Essential hypertension, benign 09/03/2014  . Morbid obesity ([place]) 03/06/2014  . HTN (hypertension) 03/06/2014  . Vertigo 03/06/2014  . Type 2 diabetes mellitus ([place]) 03/06/2014  . Pure hypercholesterolemia 03/06/2014    Scot Jun, PTA 02/17/2021, 1:53 PM  [facility]. [place], [place], [address] Phone: [phone]   Fax:  [phone]  Name: Jessica [name] MRN: [id number] Date of Birth: 1946-01-28

## 2021-02-18 DIAGNOSIS — E1165 Type 2 diabetes mellitus with hyperglycemia: Secondary | ICD-10-CM | POA: Diagnosis not present

## 2021-02-19 ENCOUNTER — Other Ambulatory Visit: Payer: Self-pay

## 2021-02-19 ENCOUNTER — Encounter: Payer: Self-pay | Admitting: Physical Therapy

## 2021-02-19 ENCOUNTER — Ambulatory Visit: Payer: Medicare Other | Admitting: Physical Therapy

## 2021-02-19 DIAGNOSIS — M25561 Pain in right knee: Secondary | ICD-10-CM | POA: Diagnosis not present

## 2021-02-19 DIAGNOSIS — M6281 Muscle weakness (generalized): Secondary | ICD-10-CM

## 2021-02-19 DIAGNOSIS — R262 Difficulty in walking, not elsewhere classified: Secondary | ICD-10-CM | POA: Diagnosis not present

## 2021-02-19 DIAGNOSIS — G8929 Other chronic pain: Secondary | ICD-10-CM

## 2021-02-19 DIAGNOSIS — R42 Dizziness and giddiness: Secondary | ICD-10-CM | POA: Diagnosis not present

## 2021-02-19 DIAGNOSIS — M545 Low back pain, unspecified: Secondary | ICD-10-CM | POA: Diagnosis not present

## 2021-02-19 NOTE — Therapy (Signed)
[facility]. [place], [place], [address] Phone: [phone]   Fax:  [phone]  Physical Therapy Treatment  Patient Details  Name: Jessica [name] MRN: [id number] Date of Birth: [birth year] Referring Provider (PT): Addison Lank   Encounter Date: 02/19/2021   PT End of Session - 02/19/21 1340    Visit Number 16    Date for PT Re-Evaluation 03/11/21    PT Start Time 1300    PT Stop Time 1342    PT Time Calculation (min) 42 min    Activity Tolerance Patient limited by pain    Behavior During Therapy [facility] for tasks assessed/performed           Past Medical History:  Diagnosis Date  . Anemia   . Anxiety   . Arthritis   . Cataract   . Depression   . Diabetes mellitus without complication ([place])   . Diastolic dysfunction   . Frozen shoulder   . Glaucoma   . Headache    History of migraines  . Heart murmur   . History of kidney stones   . Hyperlipidemia   . Hypertension   . Macular degeneration   . Neuropathy   . Obesity   . Sleep apnea   . Stroke [facility])    has been told by a Dr. Mort Sawyers that she may have had a mini stroke but no further testing was done to confirm  . Tachycardia     Past Surgical History:  Procedure Laterality Date  . CARDIAC CATHETERIZATION     -6/09-normal ejection fraction 65%, no angiographically significant CAD., M. Skains MD  . CESAREAN SECTION    . COLONOSCOPY    . foot spurs removed    . HYSTEROSCOPY WITH D & C N/A 06/28/2017   Procedure: DILATATION AND CURETTAGE /HYSTEROSCOPY/POLYPECTOMY WITH MYOSURE;  Surgeon: Christophe Louis, MD;  Location: [facility];  Service: Gynecology;  Laterality: N/A;  . JOINT REPLACEMENT     right knee replaced  . knee spurs removed    . ORIF FEMUR FRACTURE Right 04/19/2019   Procedure: OPEN REDUCTION INTERNAL FIXATION (ORIF) DISTAL FEMUR FRACTURE;  Surgeon: Rod Can, MD;  Location[facility];  Service: Orthopedics;  Laterality: Right;  . shoulder  spurs removed    . TONSILLECTOMY AND ADENOIDECTOMY    . UPPER GI ENDOSCOPY      There were no vitals filed for this visit.   Subjective Assessment - 02/19/21 1304    Subjective "Pretty good" Pt reports her toes are healing    Currently in Pain? Yes    Pain Score 2     Pain Location Leg    Pain Orientation Right                             [facility] Adult PT Treatment/Exercise - 02/19/21 0001      Knee/Hip Exercises: Aerobic   Nustep L5 x 7 min      Knee/Hip Exercises: Standing   Other Standing Knee Exercises Seated ball toss for core    Other Standing Knee Exercises standing marches 2x10 with rollator, Rows & Ext green 2x15      Knee/Hip Exercises: Seated   Ball Squeeze x15    Hamstring Curl Both;Strengthening;15 reps    Hamstring Limitations green tband    Sit to Sand 2 sets;10 reps;without UE support   Cp w/ yellow ball  PT Short Term Goals - 01/22/21 1140      PT SHORT TERM GOAL #1   Title independent with intial HEP    Status Achieved             PT Long Term Goals - 02/19/21 1330      PT LONG TERM GOAL #1   Title increase LE strength to 4+/5    Status Achieved      PT LONG TERM GOAL #2   Title decrease back and knee pain 25%    Status Partially Met      PT LONG TERM GOAL #3   Title decrease TUG time to 14 seconds    Status On-going      PT LONG TERM GOAL #4   Title increase BERG balance test to 45/56    Status On-going      PT LONG TERM GOAL #5   Title walk 300 feet with SPC    Status On-going                 Plan - 02/19/21 1340    Clinical Impression Statement Pt reports that her toes are healing. Introduced more standing interventions today. She had some difficulty with sit to stands but able to complete. LLE was weaker compared to R with seated HS curls. Difficulty  catching ball to hr R with seated ball toss. LE fatigue with ball squeeze only able to complete one set.    Comorbidities  anxiety, depression, DM, anemia, obesity, CVA, RC repair, TKA, ORIF of the right femur and left tibia    Examination-Activity Limitations Bathing;Bed Mobility;Caring for Others;Hygiene/Grooming;Locomotion Level;Toileting;Stairs    Examination-Participation Restrictions Meal Prep;Cleaning;Community Activity;Shop;Driving;Laundry    Stability/Clinical Decision Making Evolving/Moderate complexity    Clinical Decision Making Moderate    Rehab Potential Good    PT Frequency 2x / week    PT Duration 8 weeks    PT Treatment/Interventions ADLs/Self Care Home Management;Cryotherapy;Electrical Stimulation;Moist Heat;Gait training;Neuromuscular re-education;Balance training;Therapeutic exercise;Therapeutic activities;Functional mobility training;Stair training;Patient/family education;Manual techniques    PT Next Visit Plan continue strength and balance for increased safety           Patient will benefit from skilled therapeutic intervention in order to improve the following deficits and impairments:  Abnormal gait,Decreased range of motion,Difficulty walking,Cardiopulmonary status limiting activity,Decreased endurance,Decreased activity tolerance,Pain,Improper body mechanics,Decreased balance,Decreased mobility,Decreased strength,Postural dysfunction  Visit Diagnosis: Muscle weakness (generalized)  Chronic pain of right knee  Difficulty in walking, not elsewhere classified  Chronic bilateral low back pain without sciatica  Dizziness and giddiness     Problem List Patient Active Problem List   Diagnosis Date Noted  . Hyperlipidemia   . Diarrhea   . Scar 06/19/2019  . Vaginal yeast infection 05/26/2019  . At risk for adverse drug event 05/02/2019  . CHF (congestive heart failure) ([place]) 05/01/2019  . Pressure ulcer of sacral region, stage 2 ([place]) 05/01/2019  . Displaced supracondylar fracture of distal end of right femur without intracondylar extension ([place]) 04/19/2019  . Closed right  femoral fracture ([place]) 04/17/2019  . Sleep apnea   . Glaucoma   . Essential hypertension, benign 09/03/2014  . Morbid obesity ([place]) 03/06/2014  . HTN (hypertension) 03/06/2014  . Vertigo 03/06/2014  . Type 2 diabetes mellitus ([place]) 03/06/2014  . Pure hypercholesterolemia 03/06/2014    Scot Jun, PTA 02/19/2021, 1:43 PM  [facility] [place], [place], [address] Phone: [phone]   Fax:  [phone]  Name:  DONNAJEAN CHESNUT MRN: [id number] Date of Birth: 11-Apr-1946

## 2021-02-24 ENCOUNTER — Encounter: Payer: Self-pay | Admitting: Physical Therapy

## 2021-02-24 ENCOUNTER — Ambulatory Visit: Payer: Medicare Other | Attending: Family Medicine | Admitting: Physical Therapy

## 2021-02-24 ENCOUNTER — Other Ambulatory Visit: Payer: Self-pay

## 2021-02-24 DIAGNOSIS — G8929 Other chronic pain: Secondary | ICD-10-CM | POA: Diagnosis not present

## 2021-02-24 DIAGNOSIS — M6281 Muscle weakness (generalized): Secondary | ICD-10-CM | POA: Diagnosis not present

## 2021-02-24 DIAGNOSIS — R42 Dizziness and giddiness: Secondary | ICD-10-CM | POA: Diagnosis not present

## 2021-02-24 DIAGNOSIS — R262 Difficulty in walking, not elsewhere classified: Secondary | ICD-10-CM | POA: Diagnosis not present

## 2021-02-24 DIAGNOSIS — M25561 Pain in right knee: Secondary | ICD-10-CM | POA: Diagnosis not present

## 2021-02-24 DIAGNOSIS — M545 Low back pain, unspecified: Secondary | ICD-10-CM | POA: Insufficient documentation

## 2021-02-24 NOTE — Therapy (Signed)
[place]. [place], [place], [address] Phone: [phone]   Fax:  [phone]  Physical Therapy Treatment  Patient Details  Name: Jessica [name] MRN: [id number] Date of Birth: 08/06/1946 Referring Provider (PT): Addison Lank   Encounter Date: 02/24/2021   PT End of Session - 02/24/21 1341    Visit Number 17    Date for PT Re-Evaluation 03/11/21    PT Start Time 1300    PT Stop Time 1344    PT Time Calculation (min) 44 min    Activity Tolerance Patient limited by pain    Behavior During Therapy [facility] for tasks assessed/performed           Past Medical History:  Diagnosis Date  . Anemia   . Anxiety   . Arthritis   . Cataract   . Depression   . Diabetes mellitus without complication ([place])   . Diastolic dysfunction   . Frozen shoulder   . Glaucoma   . Headache    History of migraines  . Heart murmur   . History of kidney stones   . Hyperlipidemia   . Hypertension   . Macular degeneration   . Neuropathy   . Obesity   . Sleep apnea   . Stroke [facility])    has been told by a Dr. Mort Sawyers that she may have had a mini stroke but no further testing was done to confirm  . Tachycardia     Past Surgical History:  Procedure Laterality Date  . CARDIAC CATHETERIZATION     -6/09-normal ejection fraction 65%, no angiographically significant CAD., M. Skains MD  . CESAREAN SECTION    . COLONOSCOPY    . foot spurs removed    . HYSTEROSCOPY WITH D & C N/A 06/28/2017   Procedure: DILATATION AND CURETTAGE /HYSTEROSCOPY/POLYPECTOMY WITH MYOSURE;  Surgeon: Christophe Louis, MD;  Location: [facility];  Service: Gynecology;  Laterality: N/A;  . JOINT REPLACEMENT     right knee replaced  . knee spurs removed    . ORIF FEMUR FRACTURE Right 04/19/2019   Procedure: OPEN REDUCTION INTERNAL FIXATION (ORIF) DISTAL FEMUR FRACTURE;  Surgeon: Rod Can, MD;  Location: [facility];  Service: Orthopedics;  Laterality: Right;  . shoulder spurs  removed    . TONSILLECTOMY AND ADENOIDECTOMY    . UPPER GI ENDOSCOPY      There were no vitals filed for this visit.   Subjective Assessment - 02/24/21 1305    Subjective Feeling ok, R foot is a little sore    Pertinent History stroke anemia, anxiety, depression, DM among others    Currently in Pain? No/denies                             [facility] Adult PT Treatment/Exercise - 02/24/21 0001      High Level Balance   High Level Balance Activities Side stepping      Knee/Hip Exercises: Aerobic   Nustep L5 x 7 min      Knee/Hip Exercises: Standing   Other Standing Knee Exercises standing marches 2x10 with rollator, Rows & Ext green 2x15      Knee/Hip Exercises: Seated   Long Arc Quad Both;2 sets;10 reps;Strengthening    Long Arc Quad Weight 5 lbs.    Sit to Sand 2 sets;10 reps;without UE support   OHP yellow ball  PT Short Term Goals - 01/22/21 1140      PT SHORT TERM GOAL #1   Title independent with intial HEP    Status Achieved             PT Long Term Goals - 02/19/21 1330      PT LONG TERM GOAL #1   Title increase LE strength to 4+/5    Status Achieved      PT LONG TERM GOAL #2   Title decrease back and knee pain 25%    Status Partially Met      PT LONG TERM GOAL #3   Title decrease TUG time to 14 seconds    Status On-going      PT LONG TERM GOAL #4   Title increase BERG balance test to 45/56    Status On-going      PT LONG TERM GOAL #5   Title walk 300 feet with SPC    Status On-going                 Plan - 02/24/21 1342    Clinical Impression Statement Pt was limited by R foot pain today. Pt was very interested in showing pictures in phone despite being redirected multiple times. Some instability side stepping to R more so than L. Cues not to use Tband for balance with rows and extensions.    Personal Factors and Comorbidities Comorbidity 3+    Comorbidities anxiety, depression, DM, anemia,  obesity, CVA, RC repair, TKA, ORIF of the right femur and left tibia    Examination-Activity Limitations Bathing;Bed Mobility;Caring for Others;Hygiene/Grooming;Locomotion Level;Toileting;Stairs    Examination-Participation Restrictions Meal Prep;Cleaning;Community Activity;Shop;Driving;Laundry    Stability/Clinical Decision Making Evolving/Moderate complexity    Rehab Potential Good    PT Frequency 2x / week    PT Duration 8 weeks    PT Treatment/Interventions ADLs/Self Care Home Management;Cryotherapy;Electrical Stimulation;Moist Heat;Gait training;Neuromuscular re-education;Balance training;Therapeutic exercise;Therapeutic activities;Functional mobility training;Stair training;Patient/family education;Manual techniques    PT Next Visit Plan continue strength and balance for increased safety           Patient will benefit from skilled therapeutic intervention in order to improve the following deficits and impairments:  Abnormal gait,Decreased range of motion,Difficulty walking,Cardiopulmonary status limiting activity,Decreased endurance,Decreased activity tolerance,Pain,Improper body mechanics,Decreased balance,Decreased mobility,Decreased strength,Postural dysfunction  Visit Diagnosis: Muscle weakness (generalized)  Difficulty in walking, not elsewhere classified  Chronic pain of right knee  Chronic bilateral low back pain without sciatica  Dizziness and giddiness     Problem List Patient Active Problem List   Diagnosis Date Noted  . Hyperlipidemia   . Diarrhea   . Scar 06/19/2019  . Vaginal yeast infection 05/26/2019  . At risk for adverse drug event 05/02/2019  . CHF (congestive heart failure) ([place]) 05/01/2019  . Pressure ulcer of sacral region, stage 2 ([place]) 05/01/2019  . Displaced supracondylar fracture of distal end of right femur without intracondylar extension ([place]) 04/19/2019  . Closed right femoral fracture ([place]) 04/17/2019  . Sleep apnea   . Glaucoma   .  Essential hypertension, benign 09/03/2014  . Morbid obesity ([place]) 03/06/2014  . HTN (hypertension) 03/06/2014  . Vertigo 03/06/2014  . Type 2 diabetes mellitus ([place]) 03/06/2014  . Pure hypercholesterolemia 03/06/2014    Scot Jun 02/24/2021, 1:46 PM  [place]. [place], [place], [address] Phone: [phone]   Fax:  [phone]  Name: Jessica [name] MRN: [id number] Date of Birth: 03/06/1946

## 2021-02-26 ENCOUNTER — Ambulatory Visit: Payer: Medicare Other | Admitting: Physical Therapy

## 2021-02-26 ENCOUNTER — Encounter: Payer: Self-pay | Admitting: Physical Therapy

## 2021-02-26 ENCOUNTER — Other Ambulatory Visit: Payer: Self-pay

## 2021-02-26 DIAGNOSIS — M6281 Muscle weakness (generalized): Secondary | ICD-10-CM

## 2021-02-26 DIAGNOSIS — G8929 Other chronic pain: Secondary | ICD-10-CM

## 2021-02-26 DIAGNOSIS — M545 Low back pain, unspecified: Secondary | ICD-10-CM | POA: Diagnosis not present

## 2021-02-26 DIAGNOSIS — R262 Difficulty in walking, not elsewhere classified: Secondary | ICD-10-CM | POA: Diagnosis not present

## 2021-02-26 DIAGNOSIS — R42 Dizziness and giddiness: Secondary | ICD-10-CM

## 2021-02-26 DIAGNOSIS — M25561 Pain in right knee: Secondary | ICD-10-CM

## 2021-02-26 NOTE — Therapy (Signed)
[place]. [place], [place], [address] Phone: [phone]   Fax:  [phone]  Physical Therapy Treatment  Patient Details  Name: Jessica [name] MRN: [id number] Date of Birth: Jan 10, 1946 Referring Provider (PT): Addison Lank   Encounter Date: 02/26/2021   PT End of Session - 02/26/21 1553    Visit Number 18    Date for PT Re-Evaluation 03/11/21    PT Start Time 1505    PT Stop Time 1554    PT Time Calculation (min) 49 min    Activity Tolerance Patient tolerated treatment well    Behavior During Therapy [facility] for tasks assessed/performed           Past Medical History:  Diagnosis Date  . Anemia   . Anxiety   . Arthritis   . Cataract   . Depression   . Diabetes mellitus without complication ([place])   . Diastolic dysfunction   . Frozen shoulder   . Glaucoma   . Headache    History of migraines  . Heart murmur   . History of kidney stones   . Hyperlipidemia   . Hypertension   . Macular degeneration   . Neuropathy   . Obesity   . Sleep apnea   . Stroke [facility])    has been told by a Dr. Mort Sawyers that she may have had a mini stroke but no further testing was done to confirm  . Tachycardia     Past Surgical History:  Procedure Laterality Date  . CARDIAC CATHETERIZATION     -6/09-normal ejection fraction 65%, no angiographically significant CAD., M. Skains MD  . CESAREAN SECTION    . COLONOSCOPY    . foot spurs removed    . HYSTEROSCOPY WITH D & C N/A 06/28/2017   Procedure: DILATATION AND CURETTAGE /HYSTEROSCOPY/POLYPECTOMY WITH MYOSURE;  Surgeon: Christophe Louis, MD;  Location: [facility];  Service: Gynecology;  Laterality: N/A;  . JOINT REPLACEMENT     right knee replaced  . knee spurs removed    . ORIF FEMUR FRACTURE Right 04/19/2019   Procedure: OPEN REDUCTION INTERNAL FIXATION (ORIF) DISTAL FEMUR FRACTURE;  Surgeon: Rod Can, MD;  Location: [facility];  Service: Orthopedics;  Laterality: Right;  .  shoulder spurs removed    . TONSILLECTOMY AND ADENOIDECTOMY    . UPPER GI ENDOSCOPY      There were no vitals filed for this visit.   Subjective Assessment - 02/26/21 1507    Subjective My knee is a 5 or 6    Pertinent History stroke anemia, anxiety, depression, DM among others    Currently in Pain? Yes    Pain Score 6     Pain Location Knee    Pain Orientation Right                             [facility] Adult PT Treatment/Exercise - 02/26/21 0001      Ambulation/Gait   Ambulation/Gait Yes    Ambulation/Gait Assistance 5: Supervision    Assistive device Straight cane    Gait Pattern Decreased step length - left;Decreased step length - right;Decreased hip/knee flexion - left;Decreased hip/knee flexion - right;Decreased arm swing - left    Gait Comments 31f, [id number]      Neuro Re-ed    Neuro Re-ed Details  Standing ball toss anf catch x 15      Knee/Hip Exercises: Aerobic   Nustep L5  x 7 min      Knee/Hip Exercises: Standing   Forward Step Up Both;2 sets;5 reps;Hand Hold: 2;Step Height: 4"   rollator   Other Standing Knee Exercises Alt 2'' box taps w/ SPX x10, x5                    PT Short Term Goals - 01/22/21 1140      PT SHORT TERM GOAL #1   Title independent with intial HEP    Status Achieved             PT Long Term Goals - 02/19/21 1330      PT LONG TERM GOAL #1   Title increase LE strength to 4+/5    Status Achieved      PT LONG TERM GOAL #2   Title decrease back and knee pain 25%    Status Partially Met      PT LONG TERM GOAL #3   Title decrease TUG time to 14 seconds    Status On-going      PT LONG TERM GOAL #4   Title increase BERG balance test to 45/56    Status On-going      PT LONG TERM GOAL #5   Title walk 300 feet with SPC    Status On-going                 Plan - 02/26/21 1555    Clinical Impression Statement All interventions completed well, Pt abe to perform gait trials with SPC, but with  increase fatigue. Some unsteadiness noted with alt box taps. Decrease righting reaction noted with standing ball toss. Cues not to use UE much with step ups.    Comorbidities anxiety, depression, DM, anemia, obesity, CVA, RC repair, TKA, ORIF of the right femur and left tibia    Examination-Activity Limitations Bathing;Bed Mobility;Caring for Others;Hygiene/Grooming;Locomotion Level;Toileting;Stairs    Examination-Participation Restrictions Meal Prep;Cleaning;Community Activity;Shop;Driving;Laundry    Stability/Clinical Decision Making Evolving/Moderate complexity    Rehab Potential Good    PT Frequency 2x / week    PT Duration 8 weeks    PT Treatment/Interventions ADLs/Self Care Home Management;Cryotherapy;Electrical Stimulation;Moist Heat;Gait training;Neuromuscular re-education;Balance training;Therapeutic exercise;Therapeutic activities;Functional mobility training;Stair training;Patient/family education;Manual techniques    PT Next Visit Plan continue strength and balance for increased safety           Patient will benefit from skilled therapeutic intervention in order to improve the following deficits and impairments:  Abnormal gait,Decreased range of motion,Difficulty walking,Cardiopulmonary status limiting activity,Decreased endurance,Decreased activity tolerance,Pain,Improper body mechanics,Decreased balance,Decreased mobility,Decreased strength,Postural dysfunction  Visit Diagnosis: Muscle weakness (generalized)  Difficulty in walking, not elsewhere classified  Chronic pain of right knee  Chronic bilateral low back pain without sciatica  Dizziness and giddiness     Problem List Patient Active Problem List   Diagnosis Date Noted  . Hyperlipidemia   . Diarrhea   . Scar 06/19/2019  . Vaginal yeast infection 05/26/2019  . At risk for adverse drug event 05/02/2019  . CHF (congestive heart failure) ([place]) 05/01/2019  . Pressure ulcer of sacral region, stage 2 ([place])  05/01/2019  . Displaced supracondylar fracture of distal end of right femur without intracondylar extension ([place]) 04/19/2019  . Closed right femoral fracture ([place]) 04/17/2019  . Sleep apnea   . Glaucoma   . Essential hypertension, benign 09/03/2014  . Morbid obesity ([place]) 03/06/2014  . HTN (hypertension) 03/06/2014  . Vertigo 03/06/2014  . Type 2 diabetes mellitus ([place]) 03/06/2014  . Pure hypercholesterolemia  03/06/2014    Scot Jun 02/26/2021, 3:58 PM  [place]. [place], [place], [address] Phone: [phone]   Fax:  [phone]  Name: ONEIKA SIMONIAN MRN: [id number] Date of Birth: 08/24/1946

## 2021-03-03 DIAGNOSIS — H401222 Low-tension glaucoma, left eye, moderate stage: Secondary | ICD-10-CM | POA: Diagnosis not present

## 2021-03-03 DIAGNOSIS — H401211 Low-tension glaucoma, right eye, mild stage: Secondary | ICD-10-CM | POA: Diagnosis not present

## 2021-03-04 ENCOUNTER — Ambulatory Visit: Payer: Medicare Other | Admitting: Physical Therapy

## 2021-03-04 ENCOUNTER — Other Ambulatory Visit: Payer: Self-pay

## 2021-03-06 ENCOUNTER — Ambulatory Visit: Payer: Medicare Other | Admitting: Physical Therapy

## 2021-03-06 ENCOUNTER — Encounter: Payer: Self-pay | Admitting: Physical Therapy

## 2021-03-06 ENCOUNTER — Other Ambulatory Visit: Payer: Self-pay

## 2021-03-06 DIAGNOSIS — M545 Low back pain, unspecified: Secondary | ICD-10-CM | POA: Diagnosis not present

## 2021-03-06 DIAGNOSIS — R262 Difficulty in walking, not elsewhere classified: Secondary | ICD-10-CM | POA: Diagnosis not present

## 2021-03-06 DIAGNOSIS — M6281 Muscle weakness (generalized): Secondary | ICD-10-CM | POA: Diagnosis not present

## 2021-03-06 DIAGNOSIS — R42 Dizziness and giddiness: Secondary | ICD-10-CM | POA: Diagnosis not present

## 2021-03-06 DIAGNOSIS — M25561 Pain in right knee: Secondary | ICD-10-CM

## 2021-03-06 DIAGNOSIS — G8929 Other chronic pain: Secondary | ICD-10-CM | POA: Diagnosis not present

## 2021-03-06 NOTE — Therapy (Signed)
[place]. [place], [place], [address] Phone: [phone]   Fax:  [phone]  Physical Therapy Treatment  Patient Details  Name: Jessica [name] MRN: [id number] Date of Birth: [birth year] Referring Provider (PT): Addison Lank   Encounter Date: 03/06/2021   PT End of Session - 03/06/21 1144    Visit Number 19    Date for PT Re-Evaluation 03/11/21    PT Start Time 1100    PT Stop Time 1145    PT Time Calculation (min) 45 min    Activity Tolerance Patient tolerated treatment well    Behavior During Therapy [facility] for tasks assessed/performed           Past Medical History:  Diagnosis Date  . Anemia   . Anxiety   . Arthritis   . Cataract   . Depression   . Diabetes mellitus without complication ([place])   . Diastolic dysfunction   . Frozen shoulder   . Glaucoma   . Headache    History of migraines  . Heart murmur   . History of kidney stones   . Hyperlipidemia   . Hypertension   . Macular degeneration   . Neuropathy   . Obesity   . Sleep apnea   . Stroke [facility])    has been told by a Dr. Mort Sawyers that she may have had a mini stroke but no further testing was done to confirm  . Tachycardia     Past Surgical History:  Procedure Laterality Date  . CARDIAC CATHETERIZATION     -6/09-normal ejection fraction 65%, no angiographically significant CAD., M. Skains MD  . CESAREAN SECTION    . COLONOSCOPY    . foot spurs removed    . HYSTEROSCOPY WITH D & C N/A 06/28/2017   Procedure: DILATATION AND CURETTAGE /HYSTEROSCOPY/POLYPECTOMY WITH MYOSURE;  Surgeon: Christophe Louis, MD;  Location: [facility];  Service: Gynecology;  Laterality: N/A;  . JOINT REPLACEMENT     right knee replaced  . knee spurs removed    . ORIF FEMUR FRACTURE Right 04/19/2019   Procedure: OPEN REDUCTION INTERNAL FIXATION (ORIF) DISTAL FEMUR FRACTURE;  Surgeon: Rod Can, MD;  Location: [facility];  Service: Orthopedics;  Laterality: Right;  .  shoulder spurs removed    . TONSILLECTOMY AND ADENOIDECTOMY    . UPPER GI ENDOSCOPY      There were no vitals filed for this visit.   Subjective Assessment - 03/06/21 1103    Subjective Feeling ok    Currently in Pain? Yes    Pain Score 2     Pain Location Leg    Pain Orientation Right                             [facility] Adult PT Treatment/Exercise - 03/06/21 0001      Ambulation/Gait   Ambulation/Gait Yes    Ambulation/Gait Assistance 4: Min guard;5: Supervision    Assistive device Straight cane    Gait Pattern Decreased step length - left;Decreased step length - right;Decreased hip/knee flexion - left;Decreased hip/knee flexion - right;Decreased arm swing - left    Ambulation Surface Level;Indoor    Gait Comments 43f x2      Neuro Re-ed    Neuro Re-ed Details  Standing ball toss x 15      Knee/Hip Exercises: Aerobic   Other Aerobic UBE  L2 x3 each way      Knee/Hip  Exercises: Standing   Other Standing Knee Exercises Alt 2'' box taps w/ SPC x10, x5    Other Standing Knee Exercises standing marches 2x10 SPC, Rows green x15      Knee/Hip Exercises: Seated   Long Arc Quad Both;2 sets;10 reps;Strengthening    Long Arc Quad Weight 5 lbs.    Sit to Sand 2 sets;10 reps;without UE support                    PT Short Term Goals - 01/22/21 1140      PT SHORT TERM GOAL #1   Title independent with intial HEP    Status Achieved             PT Long Term Goals - 02/19/21 1330      PT LONG TERM GOAL #1   Title increase LE strength to 4+/5    Status Achieved      PT LONG TERM GOAL #2   Title decrease back and knee pain 25%    Status Partially Met      PT LONG TERM GOAL #3   Title decrease TUG time to 14 seconds    Status On-going      PT LONG TERM GOAL #4   Title increase BERG balance test to 45/56    Status On-going      PT LONG TERM GOAL #5   Title walk 300 feet with SPC    Status On-going                 Plan -  03/06/21 1144    Clinical Impression Statement Pt able to progress and increase her single distance gait trial. She reported some dizziness during her second gait trial requiring a seated rest, glucose was checked and measured 153 mmol/L. Stiff trunk noted with ball toss, increased difficulty with the side to side tosses. CGA needed with alt box taps.    Personal Factors and Comorbidities Comorbidity 3+    Comorbidities anxiety, depression, DM, anemia, obesity, CVA, RC repair, TKA, ORIF of the right femur and left tibia    Examination-Activity Limitations Bathing;Bed Mobility;Caring for Others;Hygiene/Grooming;Locomotion Level;Toileting;Stairs    Examination-Participation Restrictions Meal Prep;Cleaning;Community Activity;Shop;Driving;Laundry    Stability/Clinical Decision Making Evolving/Moderate complexity    Rehab Potential Good    PT Frequency 2x / week    PT Duration 8 weeks    PT Treatment/Interventions ADLs/Self Care Home Management;Cryotherapy;Electrical Stimulation;Moist Heat;Gait training;Neuromuscular re-education;Balance training;Therapeutic exercise;Therapeutic activities;Functional mobility training;Stair training;Patient/family education;Manual techniques    PT Next Visit Plan continue strength and balance for increased safety           Patient will benefit from skilled therapeutic intervention in order to improve the following deficits and impairments:  Abnormal gait,Decreased range of motion,Difficulty walking,Cardiopulmonary status limiting activity,Decreased endurance,Decreased activity tolerance,Pain,Improper body mechanics,Decreased balance,Decreased mobility,Decreased strength,Postural dysfunction  Visit Diagnosis: Difficulty in walking, not elsewhere classified  Chronic pain of right knee  Chronic bilateral low back pain without sciatica  Muscle weakness (generalized)     Problem List Patient Active Problem List   Diagnosis Date Noted  . Hyperlipidemia   .  Diarrhea   . Scar 06/19/2019  . Vaginal yeast infection 05/26/2019  . At risk for adverse drug event 05/02/2019  . CHF (congestive heart failure) ([place]) 05/01/2019  . Pressure ulcer of sacral region, stage 2 ([place]) 05/01/2019  . Displaced supracondylar fracture of distal end of right femur without intracondylar extension ([place]) 04/19/2019  . Closed right femoral fracture ([place]) 04/17/2019  .  Sleep apnea   . Glaucoma   . Essential hypertension, benign 09/03/2014  . Morbid obesity ([place]) 03/06/2014  . HTN (hypertension) 03/06/2014  . Vertigo 03/06/2014  . Type 2 diabetes mellitus ([place]) 03/06/2014  . Pure hypercholesterolemia 03/06/2014    Scot Jun 03/06/2021, 11:50 AM  [place]. [place], [place], [address] Phone: [phone]   Fax:  [phone]  Name: Jessica [name] MRN: [id number] Date of Birth: [birth year]

## 2021-03-10 ENCOUNTER — Other Ambulatory Visit: Payer: Self-pay

## 2021-03-10 ENCOUNTER — Ambulatory Visit: Payer: Medicare Other | Admitting: Physical Therapy

## 2021-03-10 DIAGNOSIS — M25561 Pain in right knee: Secondary | ICD-10-CM | POA: Diagnosis not present

## 2021-03-10 DIAGNOSIS — M6281 Muscle weakness (generalized): Secondary | ICD-10-CM | POA: Diagnosis not present

## 2021-03-10 DIAGNOSIS — M545 Low back pain, unspecified: Secondary | ICD-10-CM

## 2021-03-10 DIAGNOSIS — R262 Difficulty in walking, not elsewhere classified: Secondary | ICD-10-CM

## 2021-03-10 DIAGNOSIS — G8929 Other chronic pain: Secondary | ICD-10-CM

## 2021-03-10 DIAGNOSIS — R42 Dizziness and giddiness: Secondary | ICD-10-CM | POA: Diagnosis not present

## 2021-03-10 NOTE — Therapy (Signed)
[place]. [place], [place], [address] Phone: [phone]   Fax:  [phone]  Physical Therapy Treatment  Patient Details  Name: Jessica [name] MRN: [id number] Date of Birth: Mar 27, 1946 Referring Provider (PT): Addison Lank   Encounter Date: 03/10/2021   PT End of Session - 03/10/21 1046    Visit Number 20    Date for PT Re-Evaluation 04/12/21    PT Start Time 1042    PT Stop Time 6546    PT Time Calculation (min) 53 min           Past Medical History:  Diagnosis Date  . Anemia   . Anxiety   . Arthritis   . Cataract   . Depression   . Diabetes mellitus without complication ([place])   . Diastolic dysfunction   . Frozen shoulder   . Glaucoma   . Headache    History of migraines  . Heart murmur   . History of kidney stones   . Hyperlipidemia   . Hypertension   . Macular degeneration   . Neuropathy   . Obesity   . Sleep apnea   . Stroke [facility])    has been told by a Dr. Mort Sawyers that she may have had a mini stroke but no further testing was done to confirm  . Tachycardia     Past Surgical History:  Procedure Laterality Date  . CARDIAC CATHETERIZATION     -6/09-normal ejection fraction 65%, no angiographically significant CAD., M. Skains MD  . CESAREAN SECTION    . COLONOSCOPY    . foot spurs removed    . HYSTEROSCOPY WITH D & C N/A 06/28/2017   Procedure: DILATATION AND CURETTAGE /HYSTEROSCOPY/POLYPECTOMY WITH MYOSURE;  Surgeon: Christophe Louis, MD;  Location: [facility];  Service: Gynecology;  Laterality: N/A;  . JOINT REPLACEMENT     right knee replaced  . knee spurs removed    . ORIF FEMUR FRACTURE Right 04/19/2019   Procedure: OPEN REDUCTION INTERNAL FIXATION (ORIF) DISTAL FEMUR FRACTURE;  Surgeon: Rod Can, MD;  Location: [facility];  Service: Orthopedics;  Laterality: Right;  . shoulder spurs removed    . TONSILLECTOMY AND ADENOIDECTOMY    . UPPER GI ENDOSCOPY      There were no vitals filed  for this visit.   Subjective Assessment - 03/10/21 1045    Subjective normal pain in Rt knee. tired. MD checking on BP because bottom # stays low    Currently in Pain? Yes    Pain Score 4     Pain Location Knee                             [facility] Adult PT Treatment/Exercise - 03/10/21 0001      Ambulation/Gait   Gait Pattern Decreased step length - left;Decreased step length - right;Decreased hip/knee flexion - left;Decreased hip/knee flexion - right;Decreased arm swing - left    Ambulation Surface Level;Indoor    Gait Comments 48f x2 with SPC CG-min A   wide BOS, foot flat strike with heavy landing and some decreased control on RT     Standardized Balance Assessment   Standardized Balance Assessment Berg Balance Test      Knee/Hip Exercises: Aerobic   Nustep L6 x 7 min      Knee/Hip Exercises: Standing   Lateral Step Up Both;2 sets;5 reps;Hand Hold: 2;Step Height: 4"    Forward Step Up  Both;2 sets;5 reps;Hand Hold: 2;Step Height: 4"   some hyperext and decreased control in RT knee with ext   Other Standing Knee Exercises alt 6 in tap with BIL rails    Other Standing Knee Exercises standing marches 2x10 SPC, Rows green x15      Knee/Hip Exercises: Seated   Long Arc Quad Both;2 sets;10 reps;Strengthening    Long Arc Quad Weight 5 lbs.    Marching Both;2 sets;10 reps    Marching Weights 5 lbs.    Hamstring Curl Both;Strengthening;15 reps    Hamstring Limitations green tband    Sit to Sand 15 reps;without UE support   wt ball (airex attempted but unable to stand even with asistance)                   PT Short Term Goals - 01/22/21 1140      PT SHORT TERM GOAL #1   Title independent with intial HEP    Status Achieved             PT Long Term Goals - 03/10/21 1046      PT LONG TERM GOAL #1   Title increase LE strength to 4+/5    Status Achieved      PT LONG TERM GOAL #2   Title decrease back and knee pain 25%    Status Partially Met       PT LONG TERM GOAL #3   Title decrease TUG time to 14 seconds    Baseline with RW  17.6 sec    , with SPC CGA 30.6    Status Partially Met      PT LONG TERM GOAL #4   Title increase BERG balance test to 45/56    Status On-going      PT LONG TERM GOAL #5   Title walk 300 feet with SPC    Status Partially Met                 Plan - 03/10/21 1049    Clinical Impression Statement progressing with LTGS. pt verb doing more at home but would like to walk with [facility] more and feel steady- assistance needed now with limited distance. tolerated ther ex well with assistance and cuing.    PT Treatment/Interventions ADLs/Self Care Home Management;Cryotherapy;Electrical Stimulation;Moist Heat;Gait training;Neuromuscular re-education;Balance training;Therapeutic exercise;Therapeutic activities;Functional mobility training;Stair training;Patient/family education;Manual techniques    PT Next Visit Plan RENEWAL DONE. Visit 20 today but no G-CODE listed. Progress strength,balance and gait!CHECK BERG           Patient will benefit from skilled therapeutic intervention in order to improve the following deficits and impairments:  Abnormal gait,Decreased range of motion,Difficulty walking,Cardiopulmonary status limiting activity,Decreased endurance,Decreased activity tolerance,Pain,Improper body mechanics,Decreased balance,Decreased mobility,Decreased strength,Postural dysfunction  Visit Diagnosis: Difficulty in walking, not elsewhere classified  Chronic pain of right knee  Chronic bilateral low back pain without sciatica  Muscle weakness (generalized)     Problem List Patient Active Problem List   Diagnosis Date Noted  . Hyperlipidemia   . Diarrhea   . Scar 06/19/2019  . Vaginal yeast infection 05/26/2019  . At risk for adverse drug event 05/02/2019  . CHF (congestive heart failure) ([place]) 05/01/2019  . Pressure ulcer of sacral region, stage 2 ([place]) 05/01/2019  . Displaced  supracondylar fracture of distal end of right femur without intracondylar extension ([place]) 04/19/2019  . Closed right femoral fracture ([place]) 04/17/2019  . Sleep apnea   . Glaucoma   . Essential hypertension,  benign 09/03/2014  . Morbid obesity ([place]) 03/06/2014  . HTN (hypertension) 03/06/2014  . Vertigo 03/06/2014  . Type 2 diabetes mellitus ([place]) 03/06/2014  . Pure hypercholesterolemia 03/06/2014    Sumner Boast PTA 03/10/2021, 11:59 AM  [address]. [place], [place], [address] Phone: [phone]   Fax:  [phone]  Name: Jessica [name] MRN: [id number] Date of Birth: 10/06/1946

## 2021-03-12 ENCOUNTER — Ambulatory Visit: Payer: Medicare Other | Admitting: Physical Therapy

## 2021-03-12 ENCOUNTER — Other Ambulatory Visit: Payer: Self-pay

## 2021-03-12 ENCOUNTER — Encounter: Payer: Self-pay | Admitting: Physical Therapy

## 2021-03-12 DIAGNOSIS — R262 Difficulty in walking, not elsewhere classified: Secondary | ICD-10-CM | POA: Diagnosis not present

## 2021-03-12 DIAGNOSIS — M545 Low back pain, unspecified: Secondary | ICD-10-CM

## 2021-03-12 DIAGNOSIS — M25561 Pain in right knee: Secondary | ICD-10-CM | POA: Diagnosis not present

## 2021-03-12 DIAGNOSIS — M6281 Muscle weakness (generalized): Secondary | ICD-10-CM | POA: Diagnosis not present

## 2021-03-12 DIAGNOSIS — G8929 Other chronic pain: Secondary | ICD-10-CM | POA: Diagnosis not present

## 2021-03-12 DIAGNOSIS — R42 Dizziness and giddiness: Secondary | ICD-10-CM | POA: Diagnosis not present

## 2021-03-12 NOTE — Therapy (Signed)
[place]. [place], [place], [address] Phone: [phone]   Fax:  [phone]  Physical Therapy Treatment  Patient Details  Name: Jessica [name] MRN: [id number] Date of Birth: 06-27-1946 Referring Provider (PT): Addison Lank   Encounter Date: 03/12/2021   PT End of Session - 03/12/21 1141    Visit Number 21    Date for PT Re-Evaluation 04/12/21    PT Start Time 1100    PT Stop Time 1143    PT Time Calculation (min) 43 min    Activity Tolerance Patient tolerated treatment well    Behavior During Therapy [facility] for tasks assessed/performed           Past Medical History:  Diagnosis Date  . Anemia   . Anxiety   . Arthritis   . Cataract   . Depression   . Diabetes mellitus without complication ([place])   . Diastolic dysfunction   . Frozen shoulder   . Glaucoma   . Headache    History of migraines  . Heart murmur   . History of kidney stones   . Hyperlipidemia   . Hypertension   . Macular degeneration   . Neuropathy   . Obesity   . Sleep apnea   . Stroke [facility])    has been told by a Dr. Mort Sawyers that she may have had a mini stroke but no further testing was done to confirm  . Tachycardia     Past Surgical History:  Procedure Laterality Date  . CARDIAC CATHETERIZATION     -6/09-normal ejection fraction 65%, no angiographically significant CAD., M. Skains MD  . CESAREAN SECTION    . COLONOSCOPY    . foot spurs removed    . HYSTEROSCOPY WITH D & C N/A 06/28/2017   Procedure: DILATATION AND CURETTAGE /HYSTEROSCOPY/POLYPECTOMY WITH MYOSURE;  Surgeon: Christophe Louis, MD;  Location: [facility];  Service: Gynecology;  Laterality: N/A;  . JOINT REPLACEMENT     right knee replaced  . knee spurs removed    . ORIF FEMUR FRACTURE Right 04/19/2019   Procedure: OPEN REDUCTION INTERNAL FIXATION (ORIF) DISTAL FEMUR FRACTURE;  Surgeon: Rod Can, MD;  Location: [facility];  Service: Orthopedics;  Laterality: Right;  .  shoulder spurs removed    . TONSILLECTOMY AND ADENOIDECTOMY    . UPPER GI ENDOSCOPY      There were no vitals filed for this visit.   Subjective Assessment - 03/12/21 1100    Subjective "Not too bad " Normal pain in R leg    Currently in Pain? Yes    Pain Location Leg    Pain Orientation Right                             [facility] Adult PT Treatment/Exercise - 03/12/21 0001      Ambulation/Gait   Gait Pattern Decreased step length - left;Decreased step length - right;Decreased hip/knee flexion - left;Decreased hip/knee flexion - right;Decreased arm swing - left    Ambulation Surface Level;Indoor    Gait Comments 131f x2 with SPC CG-min A      High Level Balance   High Level Balance Activities Side stepping    High Level Balance Comments length of mat table      Knee/Hip Exercises: Aerobic   Nustep L6 x 5 min    Other Aerobic UBE  L2 x3 each way      Knee/Hip Exercises:  Standing   Other Standing Knee Exercises Alt 2'' box taps w/ SPC 2x10    Other Standing Knee Exercises Rows green 2x15                    PT Short Term Goals - 01/22/21 1140      PT SHORT TERM GOAL #1   Title independent with intial HEP    Status Achieved             PT Long Term Goals - 03/10/21 1046      PT LONG TERM GOAL #1   Title increase LE strength to 4+/5    Status Achieved      PT LONG TERM GOAL #2   Title decrease back and knee pain 25%    Status Partially Met      PT LONG TERM GOAL #3   Title decrease TUG time to 14 seconds    Baseline with RW  17.6 sec    , with SPC CGA 30.6    Status Partially Met      PT LONG TERM GOAL #4   Title increase BERG balance test to 45/56    Status On-going      PT LONG TERM GOAL #5   Title walk 300 feet with SPC    Status Partially Met                 Plan - 03/12/21 1141    Clinical Impression Statement Pt did well progressing with gait distance using SPC. CGA needed with alt two inch box taps using SPC.  Hesitant to try side steps without assist, mote difficulty side stepping to her R side.    Personal Factors and Comorbidities Comorbidity 3+    Comorbidities anxiety, depression, DM, anemia, obesity, CVA, RC repair, TKA, ORIF of the right femur and left tibia    Examination-Activity Limitations Bathing;Bed Mobility;Caring for Others;Hygiene/Grooming;Locomotion Level;Toileting;Stairs    Examination-Participation Restrictions Meal Prep;Cleaning;Community Activity;Shop;Driving;Laundry    Stability/Clinical Decision Making Evolving/Moderate complexity    Rehab Potential Good    PT Frequency 2x / week    PT Duration 8 weeks    PT Treatment/Interventions ADLs/Self Care Home Management;Cryotherapy;Electrical Stimulation;Moist Heat;Gait training;Neuromuscular re-education;Balance training;Therapeutic exercise;Therapeutic activities;Functional mobility training;Stair training;Patient/family education;Manual techniques    PT Next Visit Plan Progress strength,balance and gait!CHECK BERG           Patient will benefit from skilled therapeutic intervention in order to improve the following deficits and impairments:  Abnormal gait,Decreased range of motion,Difficulty walking,Cardiopulmonary status limiting activity,Decreased endurance,Decreased activity tolerance,Pain,Improper body mechanics,Decreased balance,Decreased mobility,Decreased strength,Postural dysfunction  Visit Diagnosis: Chronic pain of right knee  Chronic bilateral low back pain without sciatica  Muscle weakness (generalized)  Difficulty in walking, not elsewhere classified     Problem List Patient Active Problem List   Diagnosis Date Noted  . Hyperlipidemia   . Diarrhea   . Scar 06/19/2019  . Vaginal yeast infection 05/26/2019  . At risk for adverse drug event 05/02/2019  . CHF (congestive heart failure) ([place]) 05/01/2019  . Pressure ulcer of sacral region, stage 2 ([place]) 05/01/2019  . Displaced supracondylar fracture of  distal end of right femur without intracondylar extension ([place]) 04/19/2019  . Closed right femoral fracture ([place]) 04/17/2019  . Sleep apnea   . Glaucoma   . Essential hypertension, benign 09/03/2014  . Morbid obesity ([place]) 03/06/2014  . HTN (hypertension) 03/06/2014  . Vertigo 03/06/2014  . Type 2 diabetes mellitus ([place]) 03/06/2014  . Pure hypercholesterolemia  03/06/2014    Scot Jun, PTA 03/12/2021, 11:43 AM  [place]. [place], [place], [address] Phone: [phone]   Fax:  [phone]  Name: HOUA NIE MRN: [id number] Date of Birth: 1946/06/06

## 2021-03-13 DIAGNOSIS — Z23 Encounter for immunization: Secondary | ICD-10-CM | POA: Diagnosis not present

## 2021-03-18 ENCOUNTER — Ambulatory Visit: Payer: Medicare Other | Admitting: Physical Therapy

## 2021-03-18 ENCOUNTER — Other Ambulatory Visit: Payer: Self-pay

## 2021-03-18 ENCOUNTER — Encounter: Payer: Self-pay | Admitting: Physical Therapy

## 2021-03-18 DIAGNOSIS — M25561 Pain in right knee: Secondary | ICD-10-CM | POA: Diagnosis not present

## 2021-03-18 DIAGNOSIS — R42 Dizziness and giddiness: Secondary | ICD-10-CM | POA: Diagnosis not present

## 2021-03-18 DIAGNOSIS — G8929 Other chronic pain: Secondary | ICD-10-CM

## 2021-03-18 DIAGNOSIS — M545 Low back pain, unspecified: Secondary | ICD-10-CM | POA: Diagnosis not present

## 2021-03-18 DIAGNOSIS — M6281 Muscle weakness (generalized): Secondary | ICD-10-CM

## 2021-03-18 DIAGNOSIS — R262 Difficulty in walking, not elsewhere classified: Secondary | ICD-10-CM | POA: Diagnosis not present

## 2021-03-18 NOTE — Therapy (Signed)
[place]. [place], [place], [address] Phone: [phone]   Fax:  [phone]  Physical Therapy Treatment  Patient Details  Name: Jessica [name] MRN: [id number] Date of Birth: [birth year] Referring Provider (PT): Addison Lank   Encounter Date: 03/18/2021   PT End of Session - 03/18/21 0959    Visit Number 22    Date for PT Re-Evaluation 04/12/21    PT Start Time 0915    PT Stop Time 1000    PT Time Calculation (min) 45 min    Activity Tolerance Patient tolerated treatment well    Behavior During Therapy [facility] for tasks assessed/performed           Past Medical History:  Diagnosis Date  . Anemia   . Anxiety   . Arthritis   . Cataract   . Depression   . Diabetes mellitus without complication ([place])   . Diastolic dysfunction   . Frozen shoulder   . Glaucoma   . Headache    History of migraines  . Heart murmur   . History of kidney stones   . Hyperlipidemia   . Hypertension   . Macular degeneration   . Neuropathy   . Obesity   . Sleep apnea   . Stroke [facility])    has been told by a Dr. Mort Sawyers that she may have had a mini stroke but no further testing was done to confirm  . Tachycardia     Past Surgical History:  Procedure Laterality Date  . CARDIAC CATHETERIZATION     -6/09-normal ejection fraction 65%, no angiographically significant CAD., M. Skains MD  . CESAREAN SECTION    . COLONOSCOPY    . foot spurs removed    . HYSTEROSCOPY WITH D & C N/A 06/28/2017   Procedure: DILATATION AND CURETTAGE /HYSTEROSCOPY/POLYPECTOMY WITH MYOSURE;  Surgeon: Christophe Louis, MD;  Location: [facility];  Service: Gynecology;  Laterality: N/A;  . JOINT REPLACEMENT     right knee replaced  . knee spurs removed    . ORIF FEMUR FRACTURE Right 04/19/2019   Procedure: OPEN REDUCTION INTERNAL FIXATION (ORIF) DISTAL FEMUR FRACTURE;  Surgeon: Rod Can, MD;  Location: [facility];  Service: Orthopedics;  Laterality: Right;  .  shoulder spurs removed    . TONSILLECTOMY AND ADENOIDECTOMY    . UPPER GI ENDOSCOPY      There were no vitals filed for this visit.   Subjective Assessment - 03/18/21 0915    Subjective doing pretty good    Currently in Pain? Yes    Pain Score 3     Pain Location Leg    Pain Orientation Right              [facility] PT Assessment - 03/18/21 0001      Berg Balance Test   Sit to Stand Able to stand without using hands and stabilize independently    Standing Unsupported Able to stand safely 2 minutes    Sitting with Back Unsupported but Feet Supported on Floor or Stool Able to sit safely and securely 2 minutes    Stand to Sit Sits safely with minimal use of hands    Transfers Able to transfer safely, definite need of hands    Standing Unsupported with Eyes Closed Able to stand 10 seconds safely    Standing Unsupported with Feet Together Able to place feet together independently and stand 1 minute safely    From Standing, Reach Forward with Outstretched Arm  Can reach confidently >25 cm (10")    From Standing Position, Pick up Object from Floor Unable to pick up and needs supervision    From Standing Position, Turn to Look Behind Over each Shoulder Turn sideways only but maintains balance    Turn 360 Degrees Needs close supervision or verbal cueing    Standing Unsupported, Alternately Place Feet on Step/Stool Needs assistance to keep from falling or unable to try    Standing Unsupported, One Foot in [place] balance while stepping or standing    Standing on One Leg Unable to try or needs assist to prevent fall    Total Score 35                         [facility] Adult PT Treatment/Exercise - 03/18/21 0001      Ambulation/Gait   Gait Pattern Decreased step length - left;Decreased step length - right;Decreased hip/knee flexion - left;Decreased hip/knee flexion - right;Decreased arm swing - left    Ambulation Surface Level;Indoor    Gait Comments 165ft x2 with SPC CG-min A       Knee/Hip Exercises: Aerobic   Nustep L5 x 10 min      Knee/Hip Exercises: Standing   Other Standing Knee Exercises Rows green 2x15                    PT Short Term Goals - 01/22/21 1140      PT SHORT TERM GOAL #1   Title independent with intial HEP    Status Achieved             PT Long Term Goals - 03/18/21 0941      PT LONG TERM GOAL #4   Title increase BERG balance test to 45/56    Status On-going   35/56                Plan - 03/18/21 0959    Clinical Impression Statement Pt with a slight progression increasing her BERG balance score.Pt was unable to complete alt box taps, standing on one leg,Tandem stance, or 360 degree turns. She was able to complete gait trials, but reports some dizziness towards the end of second trial.    Personal Factors and Comorbidities Comorbidity 3+    Comorbidities anxiety, depression, DM, anemia, obesity, CVA, RC repair, TKA, ORIF of the right femur and left tibia    Examination-Activity Limitations Bathing;Bed Mobility;Caring for Others;Hygiene/Grooming;Locomotion Level;Toileting;Stairs    Examination-Participation Restrictions Meal Prep;Cleaning;Community Activity;Shop;Driving;Laundry    Stability/Clinical Decision Making Evolving/Moderate complexity    Rehab Potential Good    PT Frequency 2x / week    PT Duration 8 weeks    PT Treatment/Interventions ADLs/Self Care Home Management;Cryotherapy;Electrical Stimulation;Moist Heat;Gait training;Neuromuscular re-education;Balance training;Therapeutic exercise;Therapeutic activities;Functional mobility training;Stair training;Patient/family education;Manual techniques    PT Next Visit Plan Progress strength,balance and gait!           Patient will benefit from skilled therapeutic intervention in order to improve the following deficits and impairments:  Abnormal gait,Decreased range of motion,Difficulty walking,Cardiopulmonary status limiting activity,Decreased  endurance,Decreased activity tolerance,Pain,Improper body mechanics,Decreased balance,Decreased mobility,Decreased strength,Postural dysfunction  Visit Diagnosis: Chronic pain of right knee  Difficulty in walking, not elsewhere classified  Dizziness and giddiness  Muscle weakness (generalized)  Chronic bilateral low back pain without sciatica     Problem List Patient Active Problem List   Diagnosis Date Noted  . Hyperlipidemia   . Diarrhea   . Scar 06/19/2019  .  Vaginal yeast infection 05/26/2019  . At risk for adverse drug event 05/02/2019  . CHF (congestive heart failure) ([place]) 05/01/2019  . Pressure ulcer of sacral region, stage 2 ([place]) 05/01/2019  . Displaced supracondylar fracture of distal end of right femur without intracondylar extension ([place]) 04/19/2019  . Closed right femoral fracture ([place]) 04/17/2019  . Sleep apnea   . Glaucoma   . Essential hypertension, benign 09/03/2014  . Morbid obesity ([place]) 03/06/2014  . HTN (hypertension) 03/06/2014  . Vertigo 03/06/2014  . Type 2 diabetes mellitus ([place]) 03/06/2014  . Pure hypercholesterolemia 03/06/2014    Scot Jun, PTA 03/18/2021, 10:02 AM  [place]. [place], [place], [address] Phone: [phone]   Fax:  [phone]  Name: Jessica [name] MRN: [id number] Date of Birth: 1946-09-02

## 2021-03-20 ENCOUNTER — Ambulatory Visit: Payer: Medicare Other | Admitting: Physical Therapy

## 2021-03-20 ENCOUNTER — Other Ambulatory Visit: Payer: Self-pay

## 2021-03-20 DIAGNOSIS — R42 Dizziness and giddiness: Secondary | ICD-10-CM | POA: Diagnosis not present

## 2021-03-20 DIAGNOSIS — R262 Difficulty in walking, not elsewhere classified: Secondary | ICD-10-CM | POA: Diagnosis not present

## 2021-03-20 DIAGNOSIS — M6281 Muscle weakness (generalized): Secondary | ICD-10-CM | POA: Diagnosis not present

## 2021-03-20 DIAGNOSIS — M545 Low back pain, unspecified: Secondary | ICD-10-CM | POA: Diagnosis not present

## 2021-03-20 DIAGNOSIS — G8929 Other chronic pain: Secondary | ICD-10-CM

## 2021-03-20 DIAGNOSIS — M25561 Pain in right knee: Secondary | ICD-10-CM | POA: Diagnosis not present

## 2021-03-20 NOTE — Therapy (Signed)
[place]. [place], [place], [address] Phone: [phone]   Fax:  [phone]  Physical Therapy Treatment  Patient Details  Name: Jessica [name] MRN: [id number] Date of Birth: 26-Dec-1945 Referring Provider (PT): Addison Lank   Encounter Date: 03/20/2021   PT End of Session - 03/20/21 1141    Visit Number 23    Date for PT Re-Evaluation 04/12/21    PT Start Time 3570    PT Stop Time 1135    PT Time Calculation (min) 55 min           Past Medical History:  Diagnosis Date  . Anemia   . Anxiety   . Arthritis   . Cataract   . Depression   . Diabetes mellitus without complication ([place])   . Diastolic dysfunction   . Frozen shoulder   . Glaucoma   . Headache    History of migraines  . Heart murmur   . History of kidney stones   . Hyperlipidemia   . Hypertension   . Macular degeneration   . Neuropathy   . Obesity   . Sleep apnea   . Stroke [facility])    has been told by a Dr. Mort Sawyers that she may have had a mini stroke but no further testing was done to confirm  . Tachycardia     Past Surgical History:  Procedure Laterality Date  . CARDIAC CATHETERIZATION     -6/09-normal ejection fraction 65%, no angiographically significant CAD., M. Skains MD  . CESAREAN SECTION    . COLONOSCOPY    . foot spurs removed    . HYSTEROSCOPY WITH D & C N/A 06/28/2017   Procedure: DILATATION AND CURETTAGE /HYSTEROSCOPY/POLYPECTOMY WITH MYOSURE;  Surgeon: Christophe Louis, MD;  Location: [facility];  Service: Gynecology;  Laterality: N/A;  . JOINT REPLACEMENT     right knee replaced  . knee spurs removed    . ORIF FEMUR FRACTURE Right 04/19/2019   Procedure: OPEN REDUCTION INTERNAL FIXATION (ORIF) DISTAL FEMUR FRACTURE;  Surgeon: Rod Can, MD;  Location: [facility];  Service: Orthopedics;  Laterality: Right;  . shoulder spurs removed    . TONSILLECTOMY AND ADENOIDECTOMY    . UPPER GI ENDOSCOPY      There were no vitals filed  for this visit.   Subjective Assessment - 03/20/21 1052    Subjective doing pretty well    Currently in Pain? Yes    Pain Score 3     Pain Location Leg    Pain Orientation Right                             [facility] Adult PT Treatment/Exercise - 03/20/21 0001      Ambulation/Gait   Gait Comments 125 ft x2 with SPC CG-min A   2 x Left knee buckled, 2 brief standing rest breaks ( 1 each with walk)     Knee/Hip Exercises: Aerobic   Nustep L 6 8 min      Knee/Hip Exercises: Machines for Strengthening   Cybex Knee Extension 10# 8x BIL, LLE 5# 6 x, RT LLE 5# 2 x ( too painful)    Cybex Knee Flexion 15# 2 sets 10      Knee/Hip Exercises: Standing   Forward Step Up Both;10 reps;Hand Hold: 2;Step Height: 4"    Other Standing Knee Exercises Alt 4'' box taps w/ SPC 2x20   min A  Other Standing Knee Exercises Rows green 2x15      Knee/Hip Exercises: Seated   Sit to Sand 10 reps;without UE support   on airex CG-min A                   PT Short Term Goals - 01/22/21 1140      PT SHORT TERM GOAL #1   Title independent with intial HEP    Status Achieved             PT Long Term Goals - 03/18/21 0941      PT LONG TERM GOAL #4   Title increase BERG balance test to 45/56    Status On-going   35/56                Plan - 03/20/21 1141    Clinical Impression Statement progressed gait distance but had 2 episodes of LLE buckling and 2 x required standing rest break with SPC CG-min A. increased standing ex with assistance needed for stability. tried machine interventions but knee ext was difficult and painful.    PT Treatment/Interventions ADLs/Self Care Home Management;Cryotherapy;Electrical Stimulation;Moist Heat;Gait training;Neuromuscular re-education;Balance training;Therapeutic exercise;Therapeutic activities;Functional mobility training;Stair training;Patient/family education;Manual techniques    PT Next Visit Plan Progress strength,balance and  gait!           Patient will benefit from skilled therapeutic intervention in order to improve the following deficits and impairments:  Abnormal gait,Decreased range of motion,Difficulty walking,Cardiopulmonary status limiting activity,Decreased endurance,Decreased activity tolerance,Pain,Improper body mechanics,Decreased balance,Decreased mobility,Decreased strength,Postural dysfunction  Visit Diagnosis: Chronic pain of right knee  Difficulty in walking, not elsewhere classified  Muscle weakness (generalized)     Problem List Patient Active Problem List   Diagnosis Date Noted  . Hyperlipidemia   . Diarrhea   . Scar 06/19/2019  . Vaginal yeast infection 05/26/2019  . At risk for adverse drug event 05/02/2019  . CHF (congestive heart failure) ([place]) 05/01/2019  . Pressure ulcer of sacral region, stage 2 ([place]) 05/01/2019  . Displaced supracondylar fracture of distal end of right femur without intracondylar extension ([place]) 04/19/2019  . Closed right femoral fracture ([place]) 04/17/2019  . Sleep apnea   . Glaucoma   . Essential hypertension, benign 09/03/2014  . Morbid obesity ([place]) 03/06/2014  . HTN (hypertension) 03/06/2014  . Vertigo 03/06/2014  . Type 2 diabetes mellitus ([place]) 03/06/2014  . Pure hypercholesterolemia 03/06/2014    Jessica [name],Jessica [name] PTA 03/20/2021, 11:44 AM  [address]. [place], [place], [address] Phone: [phone]   Fax:  [phone]  Name: Jessica [name] MRN: [id number] Date of Birth: [birth year]

## 2021-03-25 ENCOUNTER — Other Ambulatory Visit: Payer: Self-pay

## 2021-03-25 ENCOUNTER — Ambulatory Visit: Payer: Medicare Other | Attending: Family Medicine | Admitting: Physical Therapy

## 2021-03-25 ENCOUNTER — Encounter: Payer: Self-pay | Admitting: Physical Therapy

## 2021-03-25 DIAGNOSIS — G8929 Other chronic pain: Secondary | ICD-10-CM | POA: Diagnosis not present

## 2021-03-25 DIAGNOSIS — R262 Difficulty in walking, not elsewhere classified: Secondary | ICD-10-CM | POA: Diagnosis not present

## 2021-03-25 DIAGNOSIS — M6281 Muscle weakness (generalized): Secondary | ICD-10-CM | POA: Insufficient documentation

## 2021-03-25 DIAGNOSIS — M545 Low back pain, unspecified: Secondary | ICD-10-CM | POA: Diagnosis not present

## 2021-03-25 DIAGNOSIS — M25561 Pain in right knee: Secondary | ICD-10-CM | POA: Diagnosis not present

## 2021-03-25 NOTE — Therapy (Signed)
[place]. [place], [place], [address] Phone: [phone]   Fax:  [phone]  Physical Therapy Treatment  Patient Details  Name: Jessica [name] MRN: [id number] Date of Birth: 1946-03-31 Referring Provider (PT): Addison Lank   Encounter Date: 03/25/2021   PT End of Session - 03/25/21 1343    Visit Number 24    PT Start Time 5465    PT Stop Time 1345    PT Time Calculation (min) 41 min    Activity Tolerance Patient tolerated treatment well    Behavior During Therapy [facility] for tasks assessed/performed           Past Medical History:  Diagnosis Date  . Anemia   . Anxiety   . Arthritis   . Cataract   . Depression   . Diabetes mellitus without complication ([place])   . Diastolic dysfunction   . Frozen shoulder   . Glaucoma   . Headache    History of migraines  . Heart murmur   . History of kidney stones   . Hyperlipidemia   . Hypertension   . Macular degeneration   . Neuropathy   . Obesity   . Sleep apnea   . Stroke [facility])    has been told by a Dr. Mort Sawyers that she may have had a mini stroke but no further testing was done to confirm  . Tachycardia     Past Surgical History:  Procedure Laterality Date  . CARDIAC CATHETERIZATION     -6/09-normal ejection fraction 65%, no angiographically significant CAD., M. Skains MD  . CESAREAN SECTION    . COLONOSCOPY    . foot spurs removed    . HYSTEROSCOPY WITH D & C N/A 06/28/2017   Procedure: DILATATION AND CURETTAGE /HYSTEROSCOPY/POLYPECTOMY WITH MYOSURE;  Surgeon: Christophe Louis, MD;  Location: [facility];  Service: Gynecology;  Laterality: N/A;  . JOINT REPLACEMENT     right knee replaced  . knee spurs removed    . ORIF FEMUR FRACTURE Right 04/19/2019   Procedure: OPEN REDUCTION INTERNAL FIXATION (ORIF) DISTAL FEMUR FRACTURE;  Surgeon: Rod Can, MD;  Location: [facility];  Service: Orthopedics;  Laterality: Right;  . shoulder spurs removed    . TONSILLECTOMY  AND ADENOIDECTOMY    . UPPER GI ENDOSCOPY      There were no vitals filed for this visit.   Subjective Assessment - 03/25/21 1300    Subjective Im good other than my sugar going down    Currently in Pain? No/denies                             [facility] Adult PT Treatment/Exercise - 03/25/21 0001      Ambulation/Gait   Gait Pattern Decreased step length - left;Decreased step length - right;Decreased hip/knee flexion - left;Decreased hip/knee flexion - right;Decreased arm swing - left    Gait Comments 75 ft x3 with SPC CG-min A      Knee/Hip Exercises: Aerobic   Nustep L5 x7 min      Knee/Hip Exercises: Standing   Other Standing Knee Exercises Rows & Ext green 2x15      Knee/Hip Exercises: Seated   Sit to Sand 10 reps;without UE support;2 sets   2nd & 3rd  set on airex                   PT Short Term Goals - 01/22/21 1140  PT SHORT TERM GOAL #1   Title independent with intial HEP    Status Achieved             PT Long Term Goals - 03/18/21 0941      PT LONG TERM GOAL #4   Title increase BERG balance test to 45/56    Status On-going   35/56                Plan - 03/25/21 1343    Clinical Impression Statement Pt required time to keep checking her glucose level because re reported that was low earlier, she reports eating before therapy and dint want it to go too high. Increase fatigue noted today with sit to stands and gait. Some instability with performing sis to stand with airex. Pt had trouble with anterior weight shift with sit to stand on airex.    Comorbidities anxiety, depression, DM, anemia, obesity, CVA, RC repair, TKA, ORIF of the right femur and left tibia    Examination-Activity Limitations Bathing;Bed Mobility;Caring for Others;Hygiene/Grooming;Locomotion Level;Toileting;Stairs    Examination-Participation Restrictions Meal Prep;Cleaning;Community Activity;Shop;Driving;Laundry    Stability/Clinical Decision Making  Evolving/Moderate complexity    Rehab Potential Good    PT Frequency 2x / week    PT Duration 8 weeks    PT Treatment/Interventions ADLs/Self Care Home Management;Cryotherapy;Electrical Stimulation;Moist Heat;Gait training;Neuromuscular re-education;Balance training;Therapeutic exercise;Therapeutic activities;Functional mobility training;Stair training;Patient/family education;Manual techniques    PT Next Visit Plan Progress strength,balance and gait!           Patient will benefit from skilled therapeutic intervention in order to improve the following deficits and impairments:  Abnormal gait,Decreased range of motion,Difficulty walking,Cardiopulmonary status limiting activity,Decreased endurance,Decreased activity tolerance,Pain,Improper body mechanics,Decreased balance,Decreased mobility,Decreased strength,Postural dysfunction  Visit Diagnosis: Chronic pain of right knee  Difficulty in walking, not elsewhere classified  Muscle weakness (generalized)     Problem List Patient Active Problem List   Diagnosis Date Noted  . Hyperlipidemia   . Diarrhea   . Scar 06/19/2019  . Vaginal yeast infection 05/26/2019  . At risk for adverse drug event 05/02/2019  . CHF (congestive heart failure) ([place]) 05/01/2019  . Pressure ulcer of sacral region, stage 2 ([place]) 05/01/2019  . Displaced supracondylar fracture of distal end of right femur without intracondylar extension ([place]) 04/19/2019  . Closed right femoral fracture ([place]) 04/17/2019  . Sleep apnea   . Glaucoma   . Essential hypertension, benign 09/03/2014  . Morbid obesity ([place]) 03/06/2014  . HTN (hypertension) 03/06/2014  . Vertigo 03/06/2014  . Type 2 diabetes mellitus ([place]) 03/06/2014  . Pure hypercholesterolemia 03/06/2014    Scot Jun, PTA 03/25/2021, 1:51 PM  [facility]. [place], [place], [address] Phone: [phone]   Fax:  [phone]  Name:  Jessica [name] MRN: [id number] Date of Birth: 05-Feb-1946

## 2021-03-31 ENCOUNTER — Ambulatory Visit: Payer: Medicare Other | Admitting: Physical Therapy

## 2021-03-31 ENCOUNTER — Other Ambulatory Visit: Payer: Self-pay

## 2021-03-31 DIAGNOSIS — G8929 Other chronic pain: Secondary | ICD-10-CM | POA: Diagnosis not present

## 2021-03-31 DIAGNOSIS — M6281 Muscle weakness (generalized): Secondary | ICD-10-CM

## 2021-03-31 DIAGNOSIS — R262 Difficulty in walking, not elsewhere classified: Secondary | ICD-10-CM

## 2021-03-31 DIAGNOSIS — M25561 Pain in right knee: Secondary | ICD-10-CM | POA: Diagnosis not present

## 2021-03-31 DIAGNOSIS — M545 Low back pain, unspecified: Secondary | ICD-10-CM | POA: Diagnosis not present

## 2021-03-31 NOTE — Therapy (Signed)
[facility] [address]. [place], [place], [address] Phone: [phone]   Fax:  [phone]  Physical Therapy Treatment  Patient Details  Name: Jessica [name] MRN: [id number] Date of Birth: [birth year] Referring Provider (PT): Addison Lank   Encounter Date: 03/31/2021   PT End of Session - 03/31/21 1121    Visit Number 25    Date for PT Re-Evaluation 04/12/21    PT Start Time 0762    PT Stop Time 1130    PT Time Calculation (min) 50 min           Past Medical History:  Diagnosis Date  . Anemia   . Anxiety   . Arthritis   . Cataract   . Depression   . Diabetes mellitus without complication ([place])   . Diastolic dysfunction   . Frozen shoulder   . Glaucoma   . Headache    History of migraines  . Heart murmur   . History of kidney stones   . Hyperlipidemia   . Hypertension   . Macular degeneration   . Neuropathy   . Obesity   . Sleep apnea   . Stroke [facility])    has been told by a Dr. Mort Sawyers that she may have had a mini stroke but no further testing was done to confirm  . Tachycardia     Past Surgical History:  Procedure Laterality Date  . CARDIAC CATHETERIZATION     -6/09-normal ejection fraction 65%, no angiographically significant CAD., M. Skains MD  . CESAREAN SECTION    . COLONOSCOPY    . foot spurs removed    . HYSTEROSCOPY WITH D & C N/A 06/28/2017   Procedure: DILATATION AND CURETTAGE /HYSTEROSCOPY/POLYPECTOMY WITH MYOSURE;  Surgeon: Christophe Louis, MD;  Location: [facility];  Service: Gynecology;  Laterality: N/A;  . JOINT REPLACEMENT     right knee replaced  . knee spurs removed    . ORIF FEMUR FRACTURE Right 04/19/2019   Procedure: OPEN REDUCTION INTERNAL FIXATION (ORIF) DISTAL FEMUR FRACTURE;  Surgeon: Rod Can, MD;  [facility];  Service: Orthopedics;  Laterality: Right;  . shoulder spurs removed    . TONSILLECTOMY AND ADENOIDECTOMY    . UPPER GI ENDOSCOPY      There were no vitals filed  for this visit.   Subjective Assessment - 03/31/21 1042    Subjective sugar is good today. taking a few steps without AD but use walls or counter    Currently in Pain? No/denies                             [facility] Adult PT Treatment/Exercise - 03/31/21 0001      Ambulation/Gait   Gait Comments 110 feet 2 x with SPC SBA- improved stride and stability   heavy RT LE strike     Knee/Hip Exercises: Aerobic   Nustep L5 x8 min      Knee/Hip Exercises: Standing   Walking with Sports Cord 20# 6 x fwd with SPC CGA   20# 3 x each side with SPC CGA cuing needed to not grab bars   Other Standing Knee Exercises 3# marching, hip flex,ext and abd 10 x each with RW on airex CGA      Knee/Hip Exercises: Seated   Sit to Sand 2 sets;10 reps;without UE support   1 set with wt ball press, 1 set on airex  PT Short Term Goals - 01/22/21 1140      PT SHORT TERM GOAL #1   Title independent with intial HEP    Status Achieved             PT Long Term Goals - 03/31/21 1122      PT LONG TERM GOAL #1   Title increase LE strength to 4+/5    Status Achieved      PT LONG TERM GOAL #2   Title decrease back and knee pain 25%    Status Partially Met      PT LONG TERM GOAL #3   Title decrease TUG time to 14 seconds    Baseline 16.8 with RW    Status Partially Met      PT LONG TERM GOAL #5   Title walk 300 feet with SPC    Status Partially Met                 Plan - 03/31/21 1123    Clinical Impression Statement amb 2 x with SPC 110 feet each with rest and no knee buckling,noted increased stride and stability. progressed with resistance gait with CGA ad cuing, pt was fearful and wanting to grab for rails. progressed standing ex on airex for balance training CGA. progressing with goals    PT Treatment/Interventions ADLs/Self Care Home Management;Cryotherapy;Electrical Stimulation;Moist Heat;Gait training;Neuromuscular re-education;Balance  training;Therapeutic exercise;Therapeutic activities;Functional mobility training;Stair training;Patient/family education;Manual techniques    PT Next Visit Plan Progress strength,balance and gait! Work towards D/C with HEP in next 2 weeks           Patient will benefit from skilled therapeutic intervention in order to improve the following deficits and impairments:  Abnormal gait,Decreased range of motion,Difficulty walking,Cardiopulmonary status limiting activity,Decreased endurance,Decreased activity tolerance,Pain,Improper body mechanics,Decreased balance,Decreased mobility,Decreased strength,Postural dysfunction  Visit Diagnosis: Chronic pain of right knee  Difficulty in walking, not elsewhere classified  Muscle weakness (generalized)     Problem List Patient Active Problem List   Diagnosis Date Noted  . Hyperlipidemia   . Diarrhea   . Scar 06/19/2019  . Vaginal yeast infection 05/26/2019  . At risk for adverse drug event 05/02/2019  . CHF (congestive heart failure) ([place]) 05/01/2019  . Pressure ulcer of sacral region, stage 2 ([place]) 05/01/2019  . Displaced supracondylar fracture of distal end of right femur without intracondylar extension ([place]) 04/19/2019  . Closed right femoral fracture ([place]) 04/17/2019  . Sleep apnea   . Glaucoma   . Essential hypertension, benign 09/03/2014  . Morbid obesity ([place]) 03/06/2014  . HTN (hypertension) 03/06/2014  . Vertigo 03/06/2014  . Type 2 diabetes mellitus ([place]) 03/06/2014  . Pure hypercholesterolemia 03/06/2014    Edris Schneck,ANGIE PTA 03/31/2021, 11:28 AM  [place]. [place], [place], [address] Phone: [phone]   Fax:  [phone]  Name: Jessica [name] MRN: [id number] Date of Birth: [birth year]

## 2021-04-02 ENCOUNTER — Other Ambulatory Visit: Payer: Self-pay

## 2021-04-02 ENCOUNTER — Ambulatory Visit: Payer: Medicare Other | Admitting: Physical Therapy

## 2021-04-02 DIAGNOSIS — M25561 Pain in right knee: Secondary | ICD-10-CM | POA: Diagnosis not present

## 2021-04-02 DIAGNOSIS — M6281 Muscle weakness (generalized): Secondary | ICD-10-CM

## 2021-04-02 DIAGNOSIS — R262 Difficulty in walking, not elsewhere classified: Secondary | ICD-10-CM

## 2021-04-02 DIAGNOSIS — G8929 Other chronic pain: Secondary | ICD-10-CM | POA: Diagnosis not present

## 2021-04-02 DIAGNOSIS — M545 Low back pain, unspecified: Secondary | ICD-10-CM | POA: Diagnosis not present

## 2021-04-02 NOTE — Therapy (Signed)
[place]. [place], [place], [address] Phone: [phone]   Fax:  [phone]  Physical Therapy Treatment  Patient Details  Name: Jessica [name] MRN: [id number] Date of Birth: 03-09-1946 Referring Provider (PT): Addison Lank   Encounter Date: 04/02/2021   PT End of Session - 04/02/21 1115     Visit Number 26    Date for PT Re-Evaluation 04/12/21    PT Start Time 1045    PT Stop Time 1115    PT Time Calculation (min) 30 min             Past Medical History:  Diagnosis Date   Anemia    Anxiety    Arthritis    Cataract    Depression    Diabetes mellitus without complication (H[facility])    Diastolic dysfunction    Frozen shoulder    Glaucoma    Headache    History of migraines   Heart murmur    History of kidney stones    Hyperlipidemia    Hypertension    Macular degeneration    Neuropathy    Obesity    Sleep apnea    Stroke [facility])    has been told by a Dr. Mort Sawyers that she may have had a mini stroke but no further testing was done to confirm   Tachycardia     Past Surgical History:  Procedure Laterality Date   CARDIAC CATHETERIZATION     -6/09-normal ejection fraction 65%, no angiographically significant CAD., M. Skains MD   CESAREAN SECTION     COLONOSCOPY     foot spurs removed     HYSTEROSCOPY WITH D & C N/A 06/28/2017   Procedure: DILATATION AND CURETTAGE /HYSTEROSCOPY/POLYPECTOMY WITH MYOSURE;  Surgeon: Christophe Louis, MD;  Location: [facility];  Service: Gynecology;  Laterality: N/A;   JOINT REPLACEMENT     right knee replaced   knee spurs removed     ORIF FEMUR FRACTURE Right 04/19/2019   Procedure: OPEN REDUCTION INTERNAL FIXATION (ORIF) DISTAL FEMUR FRACTURE;  Surgeon: Rod Can, MD;  Location[facility];  Service: Orthopedics;  Laterality: Right;   shoulder spurs removed     TONSILLECTOMY AND ADENOIDECTOMY     UPPER GI ENDOSCOPY      There were no vitals filed for this visit.    Subjective Assessment - 04/02/21 1049     Subjective BS alittle low 117. cramping Left side ( LB and stomach)    Currently in Pain? No/denies                               [facility] Adult PT Treatment/Exercise - 04/02/21 0001       Ambulation/Gait   Gait Comments amb in from the car 125 feet with SPC CGA with 1 standing rest break and 1 slight LOB but regianed without assistance      Knee/Hip Exercises: Aerobic   Nustep L5 x8 min   inceased cramping on machine. BS after 102  BP 164/81                     PT Short Term Goals - 01/22/21 1140       PT SHORT TERM GOAL #1   Title independent with intial HEP    Status Achieved               PT Long Term Goals - 03/31/21 1122  PT LONG TERM GOAL #1   Title increase LE strength to 4+/5    Status Achieved      PT LONG TERM GOAL #2   Title decrease back and knee pain 25%    Status Partially Met      PT LONG TERM GOAL #3   Title decrease TUG time to 14 seconds    Baseline 16.8 with RW    Status Partially Met      PT LONG TERM GOAL #5   Title walk 300 feet with SPC    Status Partially Met                   Plan - 04/02/21 1116     Clinical Impression Statement pt arived c/o BS low 117 after walking in, conitnued to drop even with choc and OJ. Decided not to resume session once dropped to 93.Had pt sit in clinic until BS came up and she felt safe to leave.    PT Treatment/Interventions ADLs/Self Care Home Management;Cryotherapy;Electrical Stimulation;Moist Heat;Gait training;Neuromuscular re-education;Balance training;Therapeutic exercise;Therapeutic activities;Functional mobility training;Stair training;Patient/family education;Manual techniques    PT Next Visit Plan Progress strength,balance and gait! Work towards D/C with HEP in next 1- 2 weeks             Patient will benefit from skilled therapeutic intervention in order to improve the following deficits and  impairments:  Abnormal gait, Decreased range of motion, Difficulty walking, Cardiopulmonary status limiting activity, Decreased endurance, Decreased activity tolerance, Pain, Improper body mechanics, Decreased balance, Decreased mobility, Decreased strength, Postural dysfunction  Visit Diagnosis: Difficulty in walking, not elsewhere classified  Muscle weakness (generalized)     Problem List Patient Active Problem List   Diagnosis Date Noted   Hyperlipidemia    Diarrhea    Scar 06/19/2019   Vaginal yeast infection 05/26/2019   At risk for adverse drug event 05/02/2019   CHF (congestive heart failure) ([place]) 05/01/2019   Pressure ulcer of sacral region, stage 2 ([place]) 05/01/2019   Displaced supracondylar fracture of distal end of right femur without intracondylar extension ([place]) 04/19/2019   Closed right femoral fracture ([place]) 04/17/2019   Sleep apnea    Glaucoma    Essential hypertension, benign 09/03/2014   Morbid obesity ([place]) 03/06/2014   HTN (hypertension) 03/06/2014   Vertigo 03/06/2014   Type 2 diabetes mellitus ([place]) 03/06/2014   Pure hypercholesterolemia 03/06/2014    Tamyrah Burbage,ANGIE PTA 04/02/2021, 11:19 AM  [place]. [place], [place], [address] Phone: [phone]   Fax:  [phone]  Name: Jessica [name] MRN: [id number] Date of Birth: [birth year]

## 2021-04-07 ENCOUNTER — Other Ambulatory Visit: Payer: Self-pay

## 2021-04-07 ENCOUNTER — Encounter: Payer: Self-pay | Admitting: Physical Therapy

## 2021-04-07 ENCOUNTER — Ambulatory Visit: Payer: Medicare Other | Admitting: Physical Therapy

## 2021-04-07 DIAGNOSIS — M25561 Pain in right knee: Secondary | ICD-10-CM | POA: Diagnosis not present

## 2021-04-07 DIAGNOSIS — M6281 Muscle weakness (generalized): Secondary | ICD-10-CM | POA: Diagnosis not present

## 2021-04-07 DIAGNOSIS — R262 Difficulty in walking, not elsewhere classified: Secondary | ICD-10-CM | POA: Diagnosis not present

## 2021-04-07 DIAGNOSIS — M545 Low back pain, unspecified: Secondary | ICD-10-CM | POA: Diagnosis not present

## 2021-04-07 DIAGNOSIS — G8929 Other chronic pain: Secondary | ICD-10-CM | POA: Diagnosis not present

## 2021-04-07 NOTE — Therapy (Signed)
[place]. [place], [place], [address] Phone: [phone]   Fax:  [phone]  Physical Therapy Treatment  Patient Details  Name: Jessica [name] MRN: [id number] Date of Birth: January 13, 1946 Referring Provider (PT): Addison Lank   Encounter Date: 04/07/2021   PT End of Session - 04/07/21 1142     Visit Number 27    Date for PT Re-Evaluation 04/12/21    PT Start Time 1100    PT Stop Time 1143    PT Time Calculation (min) 43 min    Activity Tolerance Patient tolerated treatment well    Behavior During Therapy [facility] for tasks assessed/performed             Past Medical History:  Diagnosis Date   Anemia    Anxiety    Arthritis    Cataract    Depression    Diabetes mellitus without complication (HCC)    Diastolic dysfunction    Frozen shoulder    Glaucoma    Headache    History of migraines   Heart murmur    History of kidney stones    Hyperlipidemia    Hypertension    Macular degeneration    Neuropathy    Obesity    Sleep apnea    Stroke [facility])    has been told by a Dr. Mort Sawyers that she may have had a mini stroke but no further testing was done to confirm   Tachycardia     Past Surgical History:  Procedure Laterality Date   CARDIAC CATHETERIZATION     -6/09-normal ejection fraction 65%, no angiographically significant CAD., M. Skains MD   CESAREAN SECTION     COLONOSCOPY     foot spurs removed     HYSTEROSCOPY WITH D & C N/A 06/28/2017   Procedure: DILATATION AND CURETTAGE /HYSTEROSCOPY/POLYPECTOMY WITH MYOSURE;  Surgeon: Christophe Louis, MD;  Location: [facility];  Service: Gynecology;  Laterality: N/A;   JOINT REPLACEMENT     right knee replaced   knee spurs removed     ORIF FEMUR FRACTURE Right 04/19/2019   Procedure: OPEN REDUCTION INTERNAL FIXATION (ORIF) DISTAL FEMUR FRACTURE;  Surgeon: Rod Can, MD;  Location: [facility];  Service: Orthopedics;  Laterality: Right;   shoulder spurs removed      TONSILLECTOMY AND ADENOIDECTOMY     UPPER GI ENDOSCOPY      There were no vitals filed for this visit.   Subjective Assessment - 04/07/21 1100     Subjective "Not bad"    Currently in Pain? No/denies                               [facility] Adult PT Treatment/Exercise - 04/07/21 0001       Ambulation/Gait   Gait Comments Gait in clinic [place] 12f one standing rest reports some dizziness on R side of head. second trial without rest      High Level Balance   High Level Balance Activities Backward walking    High Level Balance Comments Forwaed ans backwards wlking near mat table no AD      Knee/Hip Exercises: Aerobic   Nustep L5 x8 min      Knee/Hip Exercises: Standing   Other Standing Knee Exercises Alt 4'' box taps w/ SPC 2x20   min A   Other Standing Knee Exercises Rows & Ext green 2x15  PT Short Term Goals - 01/22/21 1140       PT SHORT TERM GOAL #1   Title independent with intial HEP    Status Achieved               PT Long Term Goals - 03/31/21 1122       PT LONG TERM GOAL #1   Title increase LE strength to 4+/5    Status Achieved      PT LONG TERM GOAL #2   Title decrease back and knee pain 25%    Status Partially Met      PT LONG TERM GOAL #3   Title decrease TUG time to 14 seconds    Baseline 16.8 with RW    Status Partially Met      PT LONG TERM GOAL #5   Title walk 300 feet with SPC    Status Partially Met                   Plan - 04/07/21 1143     Clinical Impression Statement Pt has one dizzy spell during gait requiring standing rest. CGA needed with alternating box taps. Small short step noted with backwards walking without AD. No reports of pain, some fatigue with activity.    Personal Factors and Comorbidities Comorbidity 3+    Comorbidities anxiety, depression, DM, anemia, obesity, CVA, RC repair, TKA, ORIF of the right femur and left tibia    Examination-Activity Limitations  Bathing;Bed Mobility;Caring for Others;Hygiene/Grooming;Locomotion Level;Toileting;Stairs    Stability/Clinical Decision Making Evolving/Moderate complexity    Rehab Potential Good    PT Frequency 2x / week    PT Duration 8 weeks    PT Treatment/Interventions ADLs/Self Care Home Management;Cryotherapy;Electrical Stimulation;Moist Heat;Gait training;Neuromuscular re-education;Balance training;Therapeutic exercise;Therapeutic activities;Functional mobility training;Stair training;Patient/family education;Manual techniques    PT Next Visit Plan Progress strength,balance and gait! Work towards D/C with HEP in next 1- 2 weeks             Patient will benefit from skilled therapeutic intervention in order to improve the following deficits and impairments:  Abnormal gait, Decreased range of motion, Difficulty walking, Cardiopulmonary status limiting activity, Decreased endurance, Decreased activity tolerance, Pain, Improper body mechanics, Decreased balance, Decreased mobility, Decreased strength, Postural dysfunction  Visit Diagnosis: Chronic pain of right knee  Chronic bilateral low back pain without sciatica  Muscle weakness (generalized)     Problem List Patient Active Problem List   Diagnosis Date Noted   Hyperlipidemia    Diarrhea    Scar 06/19/2019   Vaginal yeast infection 05/26/2019   At risk for adverse drug event 05/02/2019   CHF (congestive heart failure) ([place]) 05/01/2019   Pressure ulcer of sacral region, stage 2 ([place]) 05/01/2019   Displaced supracondylar fracture of distal end of right femur without intracondylar extension ([place]) 04/19/2019   Closed right femoral fracture ([place]) 04/17/2019   Sleep apnea    Glaucoma    Essential hypertension, benign 09/03/2014   Morbid obesity ([place]) 03/06/2014   HTN (hypertension) 03/06/2014   Vertigo 03/06/2014   Type 2 diabetes mellitus ([place]) 03/06/2014   Pure hypercholesterolemia 03/06/2014    Scot Jun 04/07/2021,  11:46 AM  [place]. [place], [place], [address] Phone: [phone]   Fax:  [phone]  Name: Jessica [name] MRN: [id number] Date of Birth: 08/17/1946

## 2021-04-09 ENCOUNTER — Ambulatory Visit: Payer: Medicare Other | Admitting: Physical Therapy

## 2021-04-09 ENCOUNTER — Other Ambulatory Visit: Payer: Self-pay

## 2021-04-09 ENCOUNTER — Encounter: Payer: Self-pay | Admitting: Physical Therapy

## 2021-04-09 DIAGNOSIS — M6281 Muscle weakness (generalized): Secondary | ICD-10-CM | POA: Diagnosis not present

## 2021-04-09 DIAGNOSIS — M545 Low back pain, unspecified: Secondary | ICD-10-CM | POA: Diagnosis not present

## 2021-04-09 DIAGNOSIS — R262 Difficulty in walking, not elsewhere classified: Secondary | ICD-10-CM | POA: Diagnosis not present

## 2021-04-09 DIAGNOSIS — G8929 Other chronic pain: Secondary | ICD-10-CM

## 2021-04-09 DIAGNOSIS — M25561 Pain in right knee: Secondary | ICD-10-CM | POA: Diagnosis not present

## 2021-04-09 NOTE — Therapy (Signed)
[facility] [address] [place], [place], [address] Phone: [phone]   Fax:  [phone]  Physical Therapy Treatment  Patient Details  Name: Jessica [name] MRN: [id number] Date of Birth: 12/02/1945 Referring Provider (PT): McNeill   Encounter Date: 04/09/2021   PT End of Session - 04/09/21 1139     Visit Number 28    Date for PT Re-Evaluation 04/12/21    PT Start Time 1058    PT Stop Time 1141    PT Time Calculation (min) 43 min    Activity Tolerance Patient tolerated treatment well    Behavior During Therapy WFL for tasks assessed/performed             Past Medical History:  Diagnosis Date   Anemia    Anxiety    Arthritis    Cataract    Depression    Diabetes mellitus without complication (HCC)    Diastolic dysfunction    Frozen shoulder    Glaucoma    Headache    History of migraines   Heart murmur    History of kidney stones    Hyperlipidemia    Hypertension    Macular degeneration    Neuropathy    Obesity    Sleep apnea    Stroke (HCC)    has been told by a Dr. Asif Qadri that she may have had a mini stroke but no further testing was done to confirm   Tachycardia     Past Surgical History:  Procedure Laterality Date   CARDIAC CATHETERIZATION     -6/09-normal ejection fraction 65%, no angiographically significant CAD., M. Skains MD   CESAREAN SECTION     COLONOSCOPY     foot spurs removed     HYSTEROSCOPY WITH D & C N/A 06/28/2017   Procedure: DILATATION AND CURETTAGE /HYSTEROSCOPY/POLYPECTOMY WITH MYOSURE;  Surgeon: Cole, Tara, MD;  Location: [facility];  Service: Gynecology;  Laterality: N/A;   JOINT REPLACEMENT     right knee replaced   knee spurs removed     ORIF FEMUR FRACTURE Right 04/19/2019   Procedure: OPEN REDUCTION INTERNAL FIXATION (ORIF) DISTAL FEMUR FRACTURE;  Surgeon: Swinteck, Brian, MD;  Location: [facility];  Service: Orthopedics;  Laterality: Right;   shoulder spurs removed      TONSILLECTOMY AND ADENOIDECTOMY     UPPER GI ENDOSCOPY      There were no vitals filed for this visit.   Subjective Assessment - 04/09/21 1104     Subjective Doing ok    Pertinent History stroke anemia, anxiety, depression, DM among others    Currently in Pain? Yes    Pain Score 1     Pain Location Leg    Pain Orientation Right                [facility] PT Assessment - 04/09/21 0001       Timed Up and Go Test   Normal TUG (seconds) 16.49                           [facility] Adult PT Treatment/Exercise - 04/09/21 0001       Ambulation/Gait   Gait Pattern Decreased step length - left;Decreased step length - right;Decreased hip/knee flexion - left;Decreased hip/knee flexion - right;Decreased arm swing - left    Gait Comments Gait in clinic SPC 150ft,      High Level Balance   High Level Balance Activities   Side stepping      Knee/Hip Exercises: Aerobic   Nustep L5 x8 min      Knee/Hip Exercises: Standing   Other Standing Knee Exercises Standing marches x20    Other Standing Knee Exercises Rows & Ext blue 2x15      Knee/Hip Exercises: Seated   Hamstring Curl Both;Strengthening;15 reps;2 sets    Hamstring Limitations blue tband    Sit to Sand 2 sets;10 reps                      PT Short Term Goals - 01/22/21 1140       PT SHORT TERM GOAL #1   Title independent with intial HEP    Status Achieved               PT Long Term Goals - 04/09/21 1140       PT LONG TERM GOAL #2   Title decrease back and knee pain 25%    Status Achieved                   Plan - 04/09/21 1140     Clinical Impression Statement Pt did well overall today, She has decreased her TUG time, time was the average of three trials. Increase her single gait distance with SPC. Cue for full ROM needed with HS curls. Some weakness present with sit to stands. SBA needed with standing marches. Pt reports that she will continues her car [facility] the [facility]    Personal  Factors and Comorbidities Comorbidity 3+    Comorbidities anxiety, depression, DM, anemia, obesity, CVA, RC repair, TKA, ORIF of the right femur and left tibia    Examination-Activity Limitations Bathing;Bed Mobility;Caring for Others;Hygiene/Grooming;Locomotion Level;Toileting;Stairs    Examination-Participation Restrictions Meal Prep;Cleaning;Community Activity;Shop;Driving;Laundry    Stability/Clinical Decision Making Evolving/Moderate complexity    Rehab Potential Good    PT Frequency 2x / week    PT Duration 8 weeks    PT Treatment/Interventions ADLs/Self Care Home Management;Cryotherapy;Electrical Stimulation;Moist Heat;Gait training;Neuromuscular re-education;Balance training;Therapeutic exercise;Therapeutic activities;Functional mobility training;Stair training;Patient/family education;Manual techniques    PT Next Visit Plan D/C             Patient will benefit from skilled therapeutic intervention in order to improve the following deficits and impairments:  Abnormal gait, Decreased range of motion, Difficulty walking, Cardiopulmonary status limiting activity, Decreased endurance, Decreased activity tolerance, Pain, Improper body mechanics, Decreased balance, Decreased mobility, Decreased strength, Postural dysfunction  Visit Diagnosis: Chronic bilateral low back pain without sciatica  Muscle weakness (generalized)  Chronic pain of right knee  Difficulty in walking, not elsewhere classified     Problem List Patient Active Problem List   Diagnosis Date Noted   Hyperlipidemia    Diarrhea    Scar 06/19/2019   Vaginal yeast infection 05/26/2019   At risk for adverse drug event 05/02/2019   CHF (congestive heart failure) ([place]) 05/01/2019   Pressure ulcer of sacral region, stage 2 ([place]) 05/01/2019   Displaced supracondylar fracture of distal end of right femur without intracondylar extension ([place]) 04/19/2019   Closed right femoral fracture ([place]) 04/17/2019   Sleep apnea     Glaucoma    Essential hypertension, benign 09/03/2014   Morbid obesity ([place]) 03/06/2014   HTN (hypertension) 03/06/2014   Vertigo 03/06/2014   Type 2 diabetes mellitus ([place]) 03/06/2014   Pure hypercholesterolemia 03/06/2014   PHYSICAL THERAPY DISCHARGE SUMMARY  Visits from Start of Care: 28     Patient agrees to discharge.  Patient goals were partially met. Patient is being discharged due to maximized rehab potential.    Scot Jun, PTA 04/09/2021, 11:42 AM  [place]. [place], [place], [address] Phone: [phone]   Fax:  [phone]  Name: Jessica [name] MRN: [id number] Date of Birth: 11-Feb-1946

## 2021-04-17 NOTE — Progress Notes (Signed)
Cardiology Office Note   Date:  04/20/2021   ID:  Jessica, [name] 12/21/1945, MRN [id number]  PCP:  Cari Caraway, MD  Cardiologist:  Dr. Marlou Porch, MD   Chief Complaint  Patient presents with   Follow-up   Pre-op Exam    History of Present Illness: Jessica [name] is a 75 y.o. female who presents for cardiology follow-up/cardiac clearance, seen for Dr. Marlou Porch.  Jessica [name] has a history of HTN, HLD, DM2, aortic atherosclerosis, morbid obesity and OSA on CPAP.  She follows Dr. Marlou Porch for the management of these.  She underwent a cardiac catheterization in 2010 which showed no evidence of flow-limiting CAD.  Echocardiogram showed normal LVEF.  She had previously been treated for palpitations and was placed on metoprolol twice daily which controlled her symptoms better.  Today she is here for follow up and preoperative clearance prior to a hysterectomy per Dr. Landry Mellow. She is currently having issues holding her urine and stool in the office, she says this is normal for her while she is under more stress. She wears depends chronically and is also on ditropan. She ambulates with a rolling walker at baseline mainly due to recent hx of orthopedic surgery and lots of foot/knee problems. She does not appear to be SOB however ambulates very slowing. She states she will occasionally get SOB however feel this is due to sedentary lifestyle and obesity which I agree. She denies chest pain or other anginal symptoms. She had a cath in 2010 that showed no CAD. Given her baseline inactivity, she is unable to perform is not active at baseline and cannot perform greater then 4 METS for activity. Her Duke Activity Index is at a 3.97 METS for functional capacity. Her Revised Risk of major cardiac events in the intra/post operative period is 6.6%. She does have a known hx of OSA on CPAP. Given these instances along with a high risk surgery, I would say she is at at least a moderate risk to proceed with  surgery. EKG is stable with no acute changes. I will plan to obtain an echocardiogram prior to her surgery. I anticipate this will be normal. She does not appear to be fluid volume overloaded on exam today.   Past Medical History:  Diagnosis Date   Anemia    Anxiety    Arthritis    Cataract    Depression    Diabetes mellitus without complication (HCC)    Diastolic dysfunction    Frozen shoulder    Glaucoma    Headache    History of migraines   Heart murmur    History of kidney stones    Hyperlipidemia    Hypertension    Macular degeneration    Neuropathy    Obesity    Sleep apnea    Stroke [facility])    has been told by a Dr. Mort Sawyers that she may have had a mini stroke but no further testing was done to confirm   Tachycardia     Past Surgical History:  Procedure Laterality Date   CARDIAC CATHETERIZATION     -6/09-normal ejection fraction 65%, no angiographically significant CAD., M. Skains MD   CESAREAN SECTION     COLONOSCOPY     foot spurs removed     HYSTEROSCOPY WITH D & C N/A 06/28/2017   Procedure: DILATATION AND CURETTAGE /HYSTEROSCOPY/POLYPECTOMY WITH MYOSURE;  Surgeon: Christophe Louis, MD;  Location: [facility];  Service: Gynecology;  Laterality: N/A;   JOINT  REPLACEMENT     right knee replaced   knee spurs removed     ORIF FEMUR FRACTURE Right 04/19/2019   Procedure: OPEN REDUCTION INTERNAL FIXATION (ORIF) DISTAL FEMUR FRACTURE;  Surgeon: Rod Can, MD;  Location: [facility];  Service: Orthopedics;  Laterality: Right;   shoulder spurs removed     TONSILLECTOMY AND ADENOIDECTOMY     UPPER GI ENDOSCOPY       Current Outpatient Medications  Medication Sig Dispense Refill   B-D INS SYRINGE 0.5CC/31GX5/16 31G X 5/16" 0.5 ML MISC Use as directed per provider.     Cholecalciferol (VITAMIN D3) 50 MCG (2000 UT) capsule Take 2,000 Units by mouth daily.     Coenzyme Q10 (CO Q-10) 100 MG CAPS Take 100 mg by mouth at bedtime.      dorzolamidel-timolol (COSOPT) 22.3-6.8 MG/ML  SOLN ophthalmic solution Place 1 drop into both eyes 2 (two) times daily.     furosemide (LASIX) 20 MG tablet Take 1 tablet (20 mg total) by mouth daily. 90 tablet 3   insulin lispro (HUMALOG) 100 UNIT/ML injection Insulin pump (Patient taking differently: Inject 3 Units into the skin continuous. Insulin pump. Maximum daily dose - 200 units) 10 mL 0   INVOKANA 300 MG TABS tablet Take 1 tablet (300 mg total) by mouth daily. 30 tablet 0   Iron-Vitamin C (VITRON-C) 65-125 MG TABS Take 1 tablet by mouth at bedtime.      melatonin 5 MG TABS Take 5 mg by mouth at bedtime.     metFORMIN (GLUCOPHAGE-XR) 500 MG 24 hr tablet Take 1 tablet (500 mg total) by mouth at bedtime. 30 tablet 0   metoprolol tartrate (LOPRESSOR) 100 MG tablet TAKE 1 TABLET TWICE A DAY 180 tablet 3   Multiple Vitamins-Minerals (PRESERVISION AREDS 2 PO) Take 1 tablet by mouth 2 (two) times daily.     ONE TOUCH ULTRA TEST test strip 1 each by Other route as needed (Use as directed per provider).   11   oxybutynin (DITROPAN-XL) 5 MG 24 hr tablet Take 5 mg by mouth at bedtime.     simvastatin (ZOCOR) 40 MG tablet Take 1 tablet (40 mg total) by mouth every evening. 30 tablet 0   spironolactone (ALDACTONE) 25 MG tablet TAKE 1 TABLET DAILY 90 tablet 3   Travoprost, BAK Free, (TRAVATAN) 0.004 % SOLN ophthalmic solution Place 1 drop into both eyes at bedtime.      No current facility-administered medications for this visit.    Allergies:   Meloxicam, Percocet [oxycodone-acetaminophen], Tramadol, Vicodin [hydrocodone-acetaminophen], Azithromycin, Iodine, and Shellfish allergy    Social History:  The patient  reports that she has never smoked. She has never used smokeless tobacco. She reports that she does not drink alcohol and does not use drugs.   Family History:  The patient's family history includes Brain cancer in her mother; Colon polyps in her father.    ROS:  Please see the history of present illness. Otherwise, review of systems  are positive for none.   All other systems are reviewed and negative.    PHYSICAL EXAM: VS:  Pulse 84   Ht 4\' 11"  (1.499 m)   Wt 233 lb (105.7 kg)   LMP  (LMP Unknown)   SpO2 95%   BMI 47.06 kg/m  , BMI Body mass index is 47.06 kg/m.  General: Obese, NAD Skin: Warm, dry, intact  Lungs:Clear to ausculation bilaterally. No wheezes, rales, or rhonchi. Breathing is unlabored. Cardiovascular: RRR with S1 S2. No  murmurs Extremities: No edema Neuro: Alert and oriented. No focal deficits. No facial asymmetry. MAE spontaneously. Psych: Responds to questions appropriately with normal affect.    EKG:  EKG is ordered today. The ekg ordered today demonstrates NSR with HR 77bpm, no acute ST/T wave changes.    Recent Labs: 08/15/2020: ALT 16; BUN 26; Creatinine, Ser 0.77; Hemoglobin 13.8; Platelets 204; Potassium 4.0; Sodium 141    Lipid Panel    Component Value Date/Time   CHOL 165 09/03/2014 1651   TRIG (H) 09/03/2014 1651    574.0 Triglyceride is over 400; calculations on Lipids are invalid.   HDL 32.70 (L) 09/03/2014 1651   CHOLHDL 5 09/03/2014 1651   VLDL 114.8 (H) 09/03/2014 1651   LDLDIRECT 67.3 09/03/2014 1651     Wt Readings from Last 3 Encounters:  04/20/21 233 lb (105.7 kg)  09/26/20 227 lb 6.4 oz (103.1 kg)  08/13/20 230 lb (104.3 kg)     ASSESSMENT AND PLAN:  1.  HTN: -Stable >>continue losartan, metoprolol, spironolactone  2.  Palpitations: -Denies recurrence -Continue metoprolol 100mg  BID   3.  DM2: -Last hemoglobin A1c, 6.9 from 03/2019 -Continue with insulin and   4.  HLD: -Last LDL, 81 -Continue statin therapy  5.  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  Morbid obesity: -BMI, 47  7.  Preoperative cardiac clearance: -Patient is scheduled for total hysterectomy with Dr. Landry Mellow scheduled for 05/13/2021. -She ambulates with a  rolling walker at baseline mainly due to recent hx of orthopedic surgery and lots of foot/knee problems. She does not appear to be SOB however ambulates very slowing. She states she will occasionally get SOB however feel this is due to sedentary lifestyle and obesity which I agree. She denies chest pain or other anginal symptoms. She had a cath in 2010 that showed no CAD. Given her baseline inactivity, she is unable to perform is not active at baseline and cannot perform greater then 4 METS for activity.  -Her Duke Activity Index is at a 3.97 METS for functional capacity.  -Revised Risk of major cardiac events in the intra/post operative period is 6.6%. She does have a known hx of OSA on CPAP. Given these instances along with a high risk surgery, I would say she is at at least a moderate risk to proceed with surgery.  -EKG is stable with no acute changes. I -I will plan to obtain an echocardiogram prior to her surgery. I anticipate this will be normal.    Current medicines are reviewed at length with the patient today.  The patient does not have concerns regarding medicines.  The following changes have been made:  no change  Labs/ tests ordered today include: echocardiogram  No orders of the defined types were placed in this encounter.  Disposition:   FU with Dr. Marlou Porch in 9 months  Signed, Kathyrn Drown, NP  04/20/2021 10:47 AM    [place], [place], [place]  [address] Phone: [phone]; Fax: [phone]

## 2021-04-20 ENCOUNTER — Ambulatory Visit (INDEPENDENT_AMBULATORY_CARE_PROVIDER_SITE_OTHER): Payer: Medicare Other | Admitting: Cardiology

## 2021-04-20 ENCOUNTER — Other Ambulatory Visit: Payer: Self-pay

## 2021-04-20 ENCOUNTER — Encounter: Payer: Self-pay | Admitting: Cardiology

## 2021-04-20 VITALS — BP 147/63 | HR 84 | Ht 59.0 in | Wt 233.0 lb

## 2021-04-20 DIAGNOSIS — I10 Essential (primary) hypertension: Secondary | ICD-10-CM

## 2021-04-20 DIAGNOSIS — E785 Hyperlipidemia, unspecified: Secondary | ICD-10-CM

## 2021-04-20 DIAGNOSIS — R011 Cardiac murmur, unspecified: Secondary | ICD-10-CM | POA: Diagnosis not present

## 2021-04-20 DIAGNOSIS — R002 Palpitations: Secondary | ICD-10-CM | POA: Diagnosis not present

## 2021-04-20 DIAGNOSIS — Z01810 Encounter for preprocedural cardiovascular examination: Secondary | ICD-10-CM

## 2021-04-20 NOTE — Patient Instructions (Signed)
Medication Instructions:  Your physician recommends that you continue on your current medications as directed. Please refer to the Current Medication list given to you today.  *If you need a refill on your cardiac medications before your next appointment, please call your pharmacy*   Lab Work: NONE If you have labs (blood work) drawn today and your tests are completely normal, you will receive your results only by: [place] (if you have MyChart) OR A paper copy in the mail If you have any lab test that is abnormal or we need to change your treatment, we will call you to review the results.   Testing/Procedures: Your physician has requested that you have an echocardiogram. Echocardiography is a painless test that uses sound waves to create images of your heart. It provides your doctor with information about the size and shape of your heart and how well your heart's chambers and valves are working. This procedure takes approximately one hour. There are no restrictions for this procedure.  Follow-Up: At [facility], you and your health needs are our priority.  As part of our continuing mission to provide you with exceptional heart care, we have created designated Provider Care Teams.  These Care Teams include your primary Cardiologist (physician) and Advanced Practice Providers (APPs -  Physician Assistants and Nurse Practitioners) who all work together to provide you with the care you need, when you need it.  We recommend signing up for the patient portal called "MyChart".  Sign up information is provided on this After Visit Summary.  MyChart is used to connect with patients for Virtual Visits (Telemedicine).  Patients are able to view lab/test results, encounter notes, upcoming appointments, etc.  Non-urgent messages can be sent to your provider as well.   To learn more about what you can do with MyChart, go to [email or web address].    Your next appointment:   1 year(s)  The  format for your next appointment:   In Person  Provider:   You may see Candee Furbish, MD or one of the following Advanced Practice Providers on your designated Care Team:   Kathyrn Drown, NP   Other Instructions Echocardiogram An echocardiogram is a test that uses sound waves (ultrasound) to produce images of the heart. Images from an echocardiogram can provide important information about: Heart size and shape. The size and thickness and movement of your heart's walls. Heart muscle function and strength. Heart valve function or if you have stenosis. Stenosis is when the heart valves are too narrow. If blood is flowing backward through the heart valves (regurgitation). A tumor or infectious growth around the heart valves. Areas of heart muscle that are not working well because of poor blood flow or injury from a heart attack. Aneurysm detection. An aneurysm is a weak or damaged part of an artery wall. The wall bulges out from the normal force of blood pumping through the body. Tell a health care provider about: Any allergies you have. All medicines you are taking, including vitamins, herbs, eye drops, creams, and over-the-counter medicines. Any blood disorders you have. Any surgeries you have had. Any medical conditions you have. Whether you are pregnant or may be pregnant. What are the risks? Generally, this is a safe test. However, problems may occur, including an allergic reaction to dye (contrast) that may be used during the test. What happens before the test? No specific preparation is needed. You may eat and drink normally. What happens during the test?  You will take off your  clothes from the waist up and put on a hospital gown. Electrodes or electrocardiogram (ECG)patches may be placed on your chest. The electrodes or patches are then connected to a device that monitors your heart rate and rhythm. You will lie down on a table for an ultrasound exam. A gel will be applied to  your chest to help sound waves pass through your skin. A handheld device, called a transducer, will be pressed against your chest and moved over your heart. The transducer produces sound waves that travel to your heart and bounce back (or "echo" back) to the transducer. These sound waves will be captured in real-time and changed into images of your heart that can be viewed on a video monitor. The images will be recorded on a computer and reviewed by your health care provider. You may be asked to change positions or hold your breath for a short time. This makes it easier to get different views or better views of your heart. In some cases, you may receive contrast through an IV in one of your veins. This can improve the quality of the pictures from your heart. The procedure may vary among health care providers and hospitals. What can I expect after the test? You may return to your normal, everyday life, including diet, activities, andmedicines, unless your health care provider tells you not to do that. Follow these instructions at home: It is up to you to get the results of your test. Ask your health care provider, or the department that is doing the test, when your results will be ready. Keep all follow-up visits. This is important. Summary An echocardiogram is a test that uses sound waves (ultrasound) to produce images of the heart. Images from an echocardiogram can provide important information about the size and shape of your heart, heart muscle function, heart valve function, and other possible heart problems. You do not need to do anything to prepare before this test. You may eat and drink normally. After the echocardiogram is completed, you may return to your normal, everyday life, unless your health care provider tells you not to do that. This information is not intended to replace advice given to you by your health care provider. Make sure you discuss any questions you have with your healthcare  provider. Document Revised: 06/03/2020 Document Reviewed: 06/03/2020 Elsevier Patient Education  2022 [employer].

## 2021-04-23 DIAGNOSIS — R296 Repeated falls: Secondary | ICD-10-CM | POA: Diagnosis not present

## 2021-04-23 DIAGNOSIS — Z7984 Long term (current) use of oral hypoglycemic drugs: Secondary | ICD-10-CM | POA: Diagnosis not present

## 2021-04-23 DIAGNOSIS — I10 Essential (primary) hypertension: Secondary | ICD-10-CM | POA: Diagnosis not present

## 2021-04-23 DIAGNOSIS — I519 Heart disease, unspecified: Secondary | ICD-10-CM | POA: Diagnosis not present

## 2021-04-23 DIAGNOSIS — H4089 Other specified glaucoma: Secondary | ICD-10-CM | POA: Diagnosis not present

## 2021-04-23 DIAGNOSIS — E782 Mixed hyperlipidemia: Secondary | ICD-10-CM | POA: Diagnosis not present

## 2021-04-23 DIAGNOSIS — E1140 Type 2 diabetes mellitus with diabetic neuropathy, unspecified: Secondary | ICD-10-CM | POA: Diagnosis not present

## 2021-04-23 DIAGNOSIS — S9031XD Contusion of right foot, subsequent encounter: Secondary | ICD-10-CM | POA: Diagnosis not present

## 2021-04-23 DIAGNOSIS — B372 Candidiasis of skin and nail: Secondary | ICD-10-CM | POA: Diagnosis not present

## 2021-04-23 DIAGNOSIS — G4733 Obstructive sleep apnea (adult) (pediatric): Secondary | ICD-10-CM | POA: Diagnosis not present

## 2021-04-23 DIAGNOSIS — N950 Postmenopausal bleeding: Secondary | ICD-10-CM | POA: Diagnosis not present

## 2021-04-23 DIAGNOSIS — R350 Frequency of micturition: Secondary | ICD-10-CM | POA: Diagnosis not present

## 2021-04-29 DIAGNOSIS — N950 Postmenopausal bleeding: Secondary | ICD-10-CM | POA: Diagnosis not present

## 2021-05-05 ENCOUNTER — Ambulatory Visit (HOSPITAL_COMMUNITY): Payer: Medicare Other | Attending: Cardiology

## 2021-05-05 ENCOUNTER — Other Ambulatory Visit: Payer: Self-pay

## 2021-05-05 DIAGNOSIS — R011 Cardiac murmur, unspecified: Secondary | ICD-10-CM | POA: Insufficient documentation

## 2021-05-06 ENCOUNTER — Other Ambulatory Visit: Payer: Self-pay

## 2021-05-06 ENCOUNTER — Encounter (HOSPITAL_BASED_OUTPATIENT_CLINIC_OR_DEPARTMENT_OTHER): Payer: Self-pay | Admitting: Obstetrics and Gynecology

## 2021-05-06 LAB — ECHOCARDIOGRAM COMPLETE
Area-P 1/2: 2.48 cm2
S' Lateral: 3.3 cm

## 2021-05-06 NOTE — Progress Notes (Signed)
Patient's chart and cardiac clearance notes reviewed with Dr Sabra Heck, [place] for [facility].

## 2021-05-07 DIAGNOSIS — E669 Obesity, unspecified: Secondary | ICD-10-CM | POA: Diagnosis not present

## 2021-05-07 DIAGNOSIS — I10 Essential (primary) hypertension: Secondary | ICD-10-CM | POA: Diagnosis not present

## 2021-05-07 DIAGNOSIS — Z9641 Presence of insulin pump (external) (internal): Secondary | ICD-10-CM | POA: Diagnosis not present

## 2021-05-07 DIAGNOSIS — E1165 Type 2 diabetes mellitus with hyperglycemia: Secondary | ICD-10-CM | POA: Diagnosis not present

## 2021-05-07 DIAGNOSIS — G609 Hereditary and idiopathic neuropathy, unspecified: Secondary | ICD-10-CM | POA: Diagnosis not present

## 2021-05-07 DIAGNOSIS — E7800 Pure hypercholesterolemia, unspecified: Secondary | ICD-10-CM | POA: Diagnosis not present

## 2021-05-11 ENCOUNTER — Other Ambulatory Visit: Payer: Self-pay | Admitting: Obstetrics and Gynecology

## 2021-05-11 ENCOUNTER — Encounter (HOSPITAL_BASED_OUTPATIENT_CLINIC_OR_DEPARTMENT_OTHER)
Admission: RE | Admit: 2021-05-11 | Discharge: 2021-05-11 | Disposition: A | Discharge status: Home / Self Care | Payer: Medicare Other | Source: Ambulatory Visit | Attending: Obstetrics and Gynecology | Admitting: Obstetrics and Gynecology

## 2021-05-11 DIAGNOSIS — Z01812 Encounter for preprocedural laboratory examination: Secondary | ICD-10-CM | POA: Insufficient documentation

## 2021-05-11 DIAGNOSIS — Z79899 Other long term (current) drug therapy: Secondary | ICD-10-CM | POA: Diagnosis not present

## 2021-05-11 DIAGNOSIS — Z6841 Body Mass Index (BMI) 40.0 and over, adult: Secondary | ICD-10-CM | POA: Diagnosis not present

## 2021-05-11 DIAGNOSIS — Z91041 Radiographic dye allergy status: Secondary | ICD-10-CM | POA: Diagnosis not present

## 2021-05-11 DIAGNOSIS — Z885 Allergy status to narcotic agent status: Secondary | ICD-10-CM | POA: Diagnosis not present

## 2021-05-11 DIAGNOSIS — N840 Polyp of corpus uteri: Secondary | ICD-10-CM | POA: Diagnosis not present

## 2021-05-11 DIAGNOSIS — Z888 Allergy status to other drugs, medicaments and biological substances status: Secondary | ICD-10-CM | POA: Diagnosis not present

## 2021-05-11 DIAGNOSIS — E1139 Type 2 diabetes mellitus with other diabetic ophthalmic complication: Secondary | ICD-10-CM | POA: Diagnosis not present

## 2021-05-11 DIAGNOSIS — I509 Heart failure, unspecified: Secondary | ICD-10-CM | POA: Diagnosis not present

## 2021-05-11 DIAGNOSIS — H42 Glaucoma in diseases classified elsewhere: Secondary | ICD-10-CM | POA: Diagnosis not present

## 2021-05-11 DIAGNOSIS — Z8249 Family history of ischemic heart disease and other diseases of the circulatory system: Secondary | ICD-10-CM | POA: Diagnosis not present

## 2021-05-11 DIAGNOSIS — I110 Hypertensive heart disease with heart failure: Secondary | ICD-10-CM | POA: Diagnosis not present

## 2021-05-11 DIAGNOSIS — Z881 Allergy status to other antibiotic agents status: Secondary | ICD-10-CM | POA: Diagnosis not present

## 2021-05-11 DIAGNOSIS — Z794 Long term (current) use of insulin: Secondary | ICD-10-CM | POA: Diagnosis not present

## 2021-05-11 DIAGNOSIS — N950 Postmenopausal bleeding: Secondary | ICD-10-CM | POA: Diagnosis not present

## 2021-05-11 LAB — BASIC METABOLIC PANEL
Anion gap: 11 (ref 5–15)
BUN: 33 mg/dL — ABNORMAL HIGH (ref 8–23)
CO2: 25 mmol/L (ref 22–32)
Calcium: 9.3 mg/dL (ref 8.9–10.3)
Chloride: 96 mmol/L — ABNORMAL LOW (ref 98–111)
Creatinine, Ser: 0.99 mg/dL (ref 0.44–1.00)
GFR, Estimated: >60 mL/min (ref >60)
Glucose, Bld: 174 mg/dL — ABNORMAL HIGH (ref 70–99)
Potassium: 5.1 mmol/L (ref 3.5–5.1)
Sodium: 132 mmol/L — ABNORMAL LOW (ref 135–145)

## 2021-05-11 NOTE — H&P (Deleted)
  The note originally documented on this encounter has been moved the the encounter in which it belongs.  

## 2021-05-11 NOTE — Anesthesia Preprocedure Evaluation (Addendum)
Anesthesia Evaluation  Patient identified by MRN, date of birth, ID band Patient awake    Reviewed: Allergy & Precautions, NPO status , Patient's Chart, lab work & pertinent test results  Airway Mallampati: III  TM Distance: >3 FB Neck ROM: Full    Dental no notable dental hx. (+) Teeth Intact, Dental Advisory Given   Pulmonary sleep apnea ,    Pulmonary exam normal breath sounds clear to auscultation       Cardiovascular hypertension, Pt. on medications +CHF  Normal cardiovascular exam Rhythm:Regular Rate:Normal     Neuro/Psych CVA    GI/Hepatic negative GI ROS, Neg liver ROS,   Endo/Other  diabetes, Type 2, Insulin DependentMorbid obesity  Renal/GU negative Renal ROS     Musculoskeletal  (+) Arthritis ,   Abdominal   Peds  Hematology  (+) anemia ,   Anesthesia Other Findings Came in pre-operatively for airway evaluation.  Thick neck.   Previous anesthetic:  Ventilation: Mask ventilation without difficulty Laryngoscope Size: Mac and 3 Grade View: Grade III Tube type: Oral Tube size: 7.5 mm Number of attempts: 1 Airway Equipment and Method: Stylet and Oral airway Placement Confirmation: ETT inserted through vocal cords under direct vision,  positive ETCO2 and breath sounds checked- equal and bilateral  Reproductive/Obstetrics                           Anesthesia Physical Anesthesia Plan  ASA: 3  Anesthesia Plan: General   Post-op Pain Management:    Induction: Intravenous  PONV Risk Score and Plan: 4 or greater and Dexamethasone, Ondansetron, Midazolam, Treatment may vary due to age or medical condition and Propofol infusion  Airway Management Planned: LMA  Additional Equipment:   Intra-op Plan:   Post-operative Plan: Extubation in OR  Informed Consent: I have reviewed the patients History and Physical, chart, labs and discussed the procedure including the risks,  benefits and alternatives for the proposed anesthesia with the patient or authorized representative who has indicated his/her understanding and acceptance.     Dental advisory given  Plan Discussed with: CRNA  Anesthesia Plan Comments:         Anesthesia Quick Evaluation

## 2021-05-11 NOTE — H&P (Signed)
Subjective:    Chief Complaint(s):      Preop Visit/ postmenopausal bleeding.       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  Does patient report new onset of COVID symptoms?  No.  Has patient or close contact tested positive for COVID-19?  No , not in the past 2 weeks.         General          75 yo presents for pre-op visit for [facility] hysteroscopy with possible polypectomy and myosure on 05/13/2021.             LAST VISIT 02/02/2021:             75 yo pt presented for postmenopausal bleeding. During visit on 01/16/2021, pt reported experiencing light cramping and some blood about 1x a month. Onset of spotting was since hysteroscopy in 2018. Spotting started occurring every 3 months, but pt reports it is now every month. Pelvic exam during visit was normal and revealed no blood in the vaginal vault.             Pelvic US 02/02/2021 showed uterus that measures 7.2 x 3.8 x 4.4cm ; endometrium that is 7.1 mm; two fibroids noted and the largest was 1.6 cm, stable in size from previous US in 2019. Lt ov nL, Rt OV not visualized. Plan on Endometrial Biopsy. Risks and benefits of EMB were explained. Pt agrees to proceed with procedure. Pt denies allergy to betadine, iodine, or shellfish. Due to Body Habitus, I was not able to visualize vaginal canal fully. EMB not able to be performed on this date. Plan on hysteroscopy w/ D&C and possible polypectomy.      Current Medication:      Taking   Nystatin 100000 UNIT/GM Cream 1 application Externally Twice a day.      Garlique(Garlic) 852 MG Tablet Delayed Release 1 tablet Orally once a day, Notes: OTC.      AZO Cranberry 250-30 MG Tablet 1 tablet Orally once a day, Notes: OTC.      Centrum Silver 50+Women - Tablet 1 tablet Orally Once a day, Notes: OTC.      CoQ-10 100 MG Capsule Orally once a day, Notes: OTC.      CPAP each night, Notes: ROSEN.      Dorzolamide HCl-Timolol Mal 22.3-6.8 MG/ML  Solution 1 drop into affected eye Ophthalmic Twice a day, Notes: HECKER.      Furosemide 20 MG Tablet 1 tablet Orally Once a day, Notes: SKAINS.      HumaLOG(Insulin Lispro) 100 UNIT/ML Solution 135units Subcutaneous via insulin pump daily, Notes: BALAN/dd.      Invokana(Canagliflozin) 300 MG Tablet 1 tablet Orally Once a day, Notes: BALAN.      Melatonin 5 MG Capsule 1 capsule 1-2 hours before bedtime Orally once a day, Notes: OTC.      metFORMIN HCl 500 MG Tablet 1 tablet with meals Orally Once a day, Notes: BALAN.      Metoprolol Tartrate 100 MG Tablet 1 tablet Orally Twice a day, Notes: SKAINS.      Oxybutynin Chloride 5 MG Tablet TAKE 1 TABLET BY MOUTH TWICE DAILY.      PreserVision AREDS 2 - Capsule 1 capsule Orally twice a day.      Simvastatin 40 MG Tablet 1 tablet in the evening Orally Once a day, Notes:  MCNEILL.      Spironolactone 25 MG Tablet 1 tablet Orally Once a day, Notes: SKAINS.      Travatan(Travoprost) 0.004 % Solution Dr Herbert Deaner Ophthalmic Once a day, Notes: HECKER.      Tylenol 8 Hour Arthritis Pain(Acetaminophen ER) 650 MG Tablet Extended Release 1 tablet by mouth every 8 hrs as needed, Notes: OTC/dd.      Vitron-C(Iron/C) 65-125 MG Tablet 1 tablet Orally Once a day, Notes: OTC.      Olmesartan Medoxomil 40 MG Tablet TAKE 1 TABLET BY MOUTH EVERY DAY.      Vitamin D 2000 units Tablet 1 tablet Orally Once a day, Notes: OTC.      Medication List reviewed and reconciled with the patient.      Medical History:   Obesity      Type II Diabetes - Dr. Cyd Silence, Dr. Johney Frame      Hyperlipidemia      Hypertension      03/2020 - displace (L) femur fracture s/p ORIF with prolonged supported recovery in hospital and SNIF      Osteoporosis      Osteoarthritis      Anemia, iron deficient      cardiac catheterization-6/09-normal ejection fraction 65%, no angiographically significant CAD., M. Skains MD      Medicare Disability      Personal history of colon  polyps      sleep apnea      Macular Degeneration, Glaucoma - Dr. Monna Fam      Right foot: numbness/tingling since knee surgery      PT for right knee      Ortho - [facility]      Gyn - Dr. Christophe Louis      GI - Dr. Therisa Doyne      Dentist - Dr. Deanna Artis      HEPTATITIS C SCREEN Negative 06/30/18      Cardiology - Dr. Marlou Porch       Allergies/Intolerance:      Percocet - headaches       Tramadol HCl - cant move       Iodine - hives      Codeine (for allergy) - hives      Azithromycin - hives      Meloxicam - SOB    Gyn History:   Sexual activity currently sexually active.   Periods : postmenopausal.   LMP random spotting.   Denies Birth control.   Last pap smear date 02/09/2001.   Last mammogram date 01/21/2021 - normal.   Denies Abnormal pap smear no.   Denies STD.   Menarche 12/13.        OB History:   Number of pregnancies  2.   Pregnancy # 1  delivery at 5 months - (baby took one breath & passed away) procedure afterward.   Pregnancy # 2  live birth, C-section, , girl.        Surgical History:   Right shoulder arthroscopic surgery - Dr. 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 Rozann Lesches (dermatologist) 06/2010      tonsillectomy      Colonoscopy 09/01/2005 03/2013      (L) great toenail removed 01/2016      abcess lower back removed from using cane      Colonoscopy 07/12/18      (R) femur fracture repair  04/16/2020       Hospitalization:   Right Shoulder Surgery 11/06/2008      Fibrids 1997      (R) femur fracture 04/16/2020      fall 07/2020       Family History:   Father: deceased 57 yrs, colon polyps, diagnosed with COPD (chronic obstructive pulmonary disease)    Mother: deceased, Died age 63 secondary to brain cancer    Paternal Grand Mother: deceased, Pancereatic cancer, diagnosed with Colon cancer    Brother 1: alive, Depression    Sister 1: alive, Bipolar,  diagnosed with Hypertension    1 daughter(s) - healthy.          No early CAD. No known family history of liver disease, or kidney disease.     Social History:       General         Tobacco use cigarettes:  Never smoked, Tobacco history last updated  04/29/2021, Vaping  No.           no EXPOSURE TO PASSIVE SMOKE.           Alcohol: yes, occasionally, wine.           no Caffeine, green tea, decaf coffee.           no Recreational drug use.           DIET: no particular dietary program.           no Exercise, NONE.           Marital Status: single, Divorced.           Children: one.           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      ROS:       CONSTITUTIONAL         Chills  No.  Fatigue  No.  Fever  No.  Night sweats  No.  Recent travel outside [place]  No.  Sweats  No.  Weight change  No.         OPHTHALMOLOGY         Blurring of vision  no.  Change in vision  no.  Double vision  no.         ENT         Dizziness  no.  Nose bleeds  no.  Sore throat  no.  Teeth pain  no.         ALLERGY         Hives  no.         CARDIOLOGY         Chest pain  no.  High blood pressure  no.  Irregular heart beat  no.  Leg edema  no.  Palpitations  no.         RESPIRATORY         Shortness of breath  no.  Cough  no.  Wheezing  no.         UROLOGY         Pain with urination  no.  Urinary urgency  no.  Urinary frequency  no.  Urinary incontinence  no.  Difficulty urinating  No.  Blood in urine  No.         GASTROENTEROLOGY         Abdominal pain  no.  Appetite change  no.  Bloating/belching  no.  Blood in stool or on toilet paper  no.  Change in bowel movements  no.  Constipation  no.  Diarrhea  no.  Difficulty swallowing  no.  Nausea  no.         FEMALE REPRODUCTIVE         Vulvar pain  no.  Vulvar rash  no.  Abnormal vaginal bleeding  yes.  Breast pain   no.  Nipple discharge  no.  Pain with intercourse  no.  Pelvic pain  no.  Unusual vaginal discharge  no.  Vaginal itching  no.         MUSCULOSKELETAL         Muscle aches  no.         NEUROLOGY         Headache  no.  Tingling/numbness  no.  Weakness  no.         PSYCHOLOGY         Depression  no.  Anxiety  no.  Nervousness  no.  Sleep disturbances  no.  Suicidal ideation  no .         ENDOCRINOLOGY         Excessive thirst  no.  Excessive urination  no.  Hair loss  no.  Heat or cold intolerance  no.         HEMATOLOGY/LYMPH         Abnormal bleeding  no.  Easy bruising  no.  Swollen glands  no.         DERMATOLOGY         New/changing skin lesion  no.  Rash  no.  Sores  no.            Negative except as stated in HPI.   Objective:    Vitals:        Wt 236.2, Wt change 3.5 lb, Ht 58.75, BMI 48.11, Pulse sitting 88, BP sitting 130/64.     Past Results:    Examination:          General Examination         CONSTITUTIONAL: alert, oriented, NAD .          SKIN:  moist, warm.          EYES:  Conjunctiva clear.          LUNGS:  good I:E efffort noted, clear to auscultation bilaterally.          HEART:  regular rate and rhythm.          ABDOMEN: soft, non-tender/non-distended, bowel sounds present .          FEMALE GENITOURINARY:  pelvic exam deferred until time of surgery as pt has difficulty getting on the exam table .          PSYCH:  affect normal, good eye contact.        Genitourinary - Female         GENERAL APPEARANCE: Pt has problems getting on exam table. Marland Kitchen      Physical Examination:         Pt aware of scribe services today.    Assessment:     Assessment:    Post-menopausal bleeding - N95.0 (Primary)        Plan:    Treatment:      Post-menopausal bleeding          Notes:  Pt is scheduled for D&C  hysteroscopy on 05/13/2021 for management of/secondary to PMB. Pt is advised she will be able to return home the same day. Discussed risks of hysteroscopy  including but not limited to infection, bleeding, possible perforation of the uterus, with the need for further surgery. . Pt is aware of risks and desires blood transfusion if needed. Pt advised to avoid NSAIDs (Aspirin, Aleve, Advil, Ibuprofen, Motrin) from now until surgery given risk of bleeding during surgery. She may take Tylenol for pain management. She is advised to avoid eating or drinking starting midnight prior to surgery. Discussed post-surgery avoidance of driving for 24 hours and avoidance of intercourse for 2 weeks after procedure.

## 2021-05-13 ENCOUNTER — Encounter (HOSPITAL_BASED_OUTPATIENT_CLINIC_OR_DEPARTMENT_OTHER): Payer: Self-pay | Admitting: Obstetrics and Gynecology

## 2021-05-13 ENCOUNTER — Ambulatory Visit (HOSPITAL_BASED_OUTPATIENT_CLINIC_OR_DEPARTMENT_OTHER): Payer: Medicare Other | Admitting: Anesthesiology

## 2021-05-13 ENCOUNTER — Ambulatory Visit (HOSPITAL_BASED_OUTPATIENT_CLINIC_OR_DEPARTMENT_OTHER)
Admission: RE | Admit: 2021-05-13 | Discharge: 2021-05-13 | Disposition: A | Discharge status: Home / Self Care | Payer: Medicare Other | Attending: Obstetrics and Gynecology | Admitting: Obstetrics and Gynecology

## 2021-05-13 ENCOUNTER — Other Ambulatory Visit: Payer: Self-pay

## 2021-05-13 ENCOUNTER — Encounter (HOSPITAL_BASED_OUTPATIENT_CLINIC_OR_DEPARTMENT_OTHER)
Admission: RE | Disposition: A | Discharge status: Home / Self Care | Payer: Self-pay | Source: Home / Self Care | Attending: Obstetrics and Gynecology

## 2021-05-13 DIAGNOSIS — Z888 Allergy status to other drugs, medicaments and biological substances status: Secondary | ICD-10-CM | POA: Diagnosis not present

## 2021-05-13 DIAGNOSIS — I509 Heart failure, unspecified: Secondary | ICD-10-CM | POA: Insufficient documentation

## 2021-05-13 DIAGNOSIS — Z6841 Body Mass Index (BMI) 40.0 and over, adult: Secondary | ICD-10-CM | POA: Diagnosis not present

## 2021-05-13 DIAGNOSIS — G4730 Sleep apnea, unspecified: Secondary | ICD-10-CM | POA: Diagnosis not present

## 2021-05-13 DIAGNOSIS — I110 Hypertensive heart disease with heart failure: Secondary | ICD-10-CM | POA: Diagnosis not present

## 2021-05-13 DIAGNOSIS — Z79899 Other long term (current) drug therapy: Secondary | ICD-10-CM | POA: Insufficient documentation

## 2021-05-13 DIAGNOSIS — H42 Glaucoma in diseases classified elsewhere: Secondary | ICD-10-CM | POA: Insufficient documentation

## 2021-05-13 DIAGNOSIS — Z91041 Radiographic dye allergy status: Secondary | ICD-10-CM | POA: Insufficient documentation

## 2021-05-13 DIAGNOSIS — N858 Other specified noninflammatory disorders of uterus: Secondary | ICD-10-CM | POA: Diagnosis not present

## 2021-05-13 DIAGNOSIS — Z881 Allergy status to other antibiotic agents status: Secondary | ICD-10-CM | POA: Insufficient documentation

## 2021-05-13 DIAGNOSIS — N950 Postmenopausal bleeding: Secondary | ICD-10-CM | POA: Diagnosis not present

## 2021-05-13 DIAGNOSIS — E7800 Pure hypercholesterolemia, unspecified: Secondary | ICD-10-CM | POA: Diagnosis not present

## 2021-05-13 DIAGNOSIS — N840 Polyp of corpus uteri: Secondary | ICD-10-CM | POA: Insufficient documentation

## 2021-05-13 DIAGNOSIS — Z8249 Family history of ischemic heart disease and other diseases of the circulatory system: Secondary | ICD-10-CM | POA: Insufficient documentation

## 2021-05-13 DIAGNOSIS — E1139 Type 2 diabetes mellitus with other diabetic ophthalmic complication: Secondary | ICD-10-CM | POA: Diagnosis not present

## 2021-05-13 DIAGNOSIS — Z885 Allergy status to narcotic agent status: Secondary | ICD-10-CM | POA: Insufficient documentation

## 2021-05-13 DIAGNOSIS — Z794 Long term (current) use of insulin: Secondary | ICD-10-CM | POA: Diagnosis not present

## 2021-05-13 HISTORY — PX: HYSTEROSCOPY WITH D & C: SHX1775

## 2021-05-13 LAB — GLUCOSE, CAPILLARY
Glucose-Capillary: 103 mg/dL — ABNORMAL HIGH (ref 70–99)
Glucose-Capillary: 94 mg/dL (ref 70–99)
Glucose-Capillary: 150 mg/dL — ABNORMAL HIGH (ref 70–99)

## 2021-05-13 LAB — CBC
HCT: 38.2 % (ref 36.0–46.0)
Hemoglobin: 12.8 g/dL (ref 12.0–15.0)
MCH: 28.8 pg (ref 26.0–34.0)
MCHC: 33.5 g/dL (ref 30.0–36.0)
MCV: 86 fL (ref 80.0–100.0)
Platelets: 215 10*3/uL (ref 150–400)
RBC: 4.44 MIL/uL (ref 3.87–5.11)
RDW: 13.9 % (ref 11.5–15.5)
WBC: 9.2 10*3/uL (ref 4.0–10.5)
nRBC: 0 % (ref 0.0–0.2)

## 2021-05-13 SURGERY — DILATATION AND CURETTAGE /HYSTEROSCOPY
Anesthesia: General | Site: Uterus

## 2021-05-13 MED ORDER — DEXAMETHASONE SODIUM PHOSPHATE 4 MG/ML IJ SOLN
INTRAMUSCULAR | Status: DC | PRN
  Administered 2021-05-13: 5 mg via INTRAVENOUS

## 2021-05-13 MED ORDER — LACTATED RINGERS IV SOLN: INTRAVENOUS | Status: DC

## 2021-05-13 MED ORDER — PROPOFOL 10 MG/ML IV BOLUS
INTRAVENOUS | Status: AC
  Filled 2021-05-13: qty 20

## 2021-05-13 MED ORDER — ONDANSETRON HCL 4 MG/2ML IJ SOLN
INTRAMUSCULAR | Status: DC | PRN
  Administered 2021-05-13: 4 mg via INTRAVENOUS

## 2021-05-13 MED ORDER — ACETAMINOPHEN 500 MG PO TABS
ORAL_TABLET | ORAL | Status: AC
  Filled 2021-05-13: qty 2

## 2021-05-13 MED ORDER — MIDAZOLAM HCL 2 MG/2ML IJ SOLN
INTRAMUSCULAR | Status: AC
  Filled 2021-05-13: qty 2

## 2021-05-13 MED ORDER — SILVER NITRATE-POT NITRATE 75-25 % EX MISC
CUTANEOUS | Status: AC
  Filled 2021-05-13: qty 10

## 2021-05-13 MED ORDER — ACETAMINOPHEN 500 MG PO TABS
1000.0000 mg | ORAL_TABLET | Freq: Once | ORAL | Status: AC
  Administered 2021-05-13: 1000 mg via ORAL

## 2021-05-13 MED ORDER — PROPOFOL 10 MG/ML IV BOLUS
INTRAVENOUS | Status: DC | PRN
  Administered 2021-05-13: 300 mg via INTRAVENOUS
  Administered 2021-05-13: 150 mg via INTRAVENOUS

## 2021-05-13 MED ORDER — IBUPROFEN 600 MG PO TABS: 600.0000 mg | ORAL_TABLET | Freq: Four times a day (QID) | ORAL | 0 refills | Status: AC | PRN

## 2021-05-13 MED ORDER — BUPIVACAINE HCL (PF) 0.25 % IJ SOLN
INTRAMUSCULAR | Status: AC
  Filled 2021-05-13: qty 30

## 2021-05-13 MED ORDER — SODIUM CHLORIDE 0.9 % IR SOLN
Status: DC | PRN
  Administered 2021-05-13: 1

## 2021-05-13 MED ORDER — BUPIVACAINE HCL (PF) 0.25 % IJ SOLN
INTRAMUSCULAR | Status: DC | PRN
  Administered 2021-05-13: 20 mL

## 2021-05-13 MED ORDER — FENTANYL CITRATE (PF) 100 MCG/2ML IJ SOLN
INTRAMUSCULAR | Status: AC
  Filled 2021-05-13: qty 2

## 2021-05-13 MED ORDER — AMISULPRIDE (ANTIEMETIC) 5 MG/2ML IV SOLN: 10.0000 mg | Freq: Once | INTRAVENOUS | Status: DC | PRN

## 2021-05-13 MED ORDER — DOXYCYCLINE HYCLATE 50 MG PO CAPS: 50.0000 mg | ORAL_CAPSULE | Freq: Two times a day (BID) | ORAL | 0 refills | Status: AC

## 2021-05-13 MED ORDER — FENTANYL CITRATE (PF) 100 MCG/2ML IJ SOLN
INTRAMUSCULAR | Status: DC | PRN
  Administered 2021-05-13 (×2): 50 ug via INTRAVENOUS

## 2021-05-13 MED ORDER — SUCCINYLCHOLINE CHLORIDE 20 MG/ML IJ SOLN
INTRAMUSCULAR | Status: DC | PRN
  Administered 2021-05-13: 100 mg via INTRAVENOUS

## 2021-05-13 MED ORDER — FENTANYL CITRATE (PF) 100 MCG/2ML IJ SOLN: 25.0000 ug | INTRAMUSCULAR | Status: DC | PRN

## 2021-05-13 MED ORDER — LIDOCAINE HCL (CARDIAC) PF 100 MG/5ML IV SOSY
PREFILLED_SYRINGE | INTRAVENOUS | Status: DC | PRN
Start: 2021-05-13 — End: 2021-05-13
  Administered 2021-05-13: 60 mg via INTRAVENOUS

## 2021-05-13 SURGICAL SUPPLY — 16 items
CATH ROBINSON RED A/P 16FR (CATHETERS) ×2 IMPLANT
DEVICE MYOSURE REACH (MISCELLANEOUS) ×1 IMPLANT
DILATOR CANAL MILEX (MISCELLANEOUS) IMPLANT
ELECT REM PT RETURN 9FT ADLT (ELECTROSURGICAL)
ELECTRODE REM PT RTRN 9FT ADLT (ELECTROSURGICAL) ×1 IMPLANT
GAUZE 4X4 16PLY ~~LOC~~+RFID DBL (SPONGE) ×2 IMPLANT
GLOVE SURG ENC TEXT LTX SZ6.5 (GLOVE) ×2 IMPLANT
GLOVE SURG UNDER POLY LF SZ6.5 (GLOVE) ×2 IMPLANT
GLOVE SURG UNDER POLY LF SZ7 (GLOVE) ×2 IMPLANT
GOWN STRL REUS W/TWL LRG LVL3 (GOWN DISPOSABLE) ×4 IMPLANT
KIT PROCEDURE FLUENT (KITS) ×2 IMPLANT
PACK VAGINAL MINOR WOMEN LF (CUSTOM PROCEDURE TRAY) ×2 IMPLANT
PAD OB MATERNITY 4.3X12.25 (PERSONAL CARE ITEMS) ×2 IMPLANT
PAD PREP 24X48 CUFFED NSTRL (MISCELLANEOUS) ×2 IMPLANT
SLEEVE SCD COMPRESS KNEE MED (STOCKING) ×2 IMPLANT
TOWEL GREEN STERILE FF (TOWEL DISPOSABLE) ×4 IMPLANT

## 2021-05-13 NOTE — Discharge Instructions (Signed)

## 2021-05-13 NOTE — H&P (Signed)
Date of Initial H&P: 05/11/2021  History reviewed, patient examined, no change in status, stable for surgery.

## 2021-05-13 NOTE — Transfer of Care (Signed)
Immediate Anesthesia Transfer of Care Note  Patient: Jessica [name]  Procedure(s) Performed: DILATATION AND CURETTAGE /HYSTEROSCOPY AND POSSIBLE POLYPECTOMY  Patient Location: PACU  Anesthesia Type:General  Level of Consciousness: awake, alert  and oriented  Airway & Oxygen Therapy: Patient Spontanous Breathing and Patient connected to face mask oxygen  Post-op Assessment: Report given to RN and Post -op Vital signs reviewed and stable  Post vital signs: Reviewed and stable  Last Vitals:  Vitals Value Taken Time  BP 139/77 05/13/21 1115  Temp    Pulse 71 05/13/21 1119  Resp 20 05/13/21 1119  SpO2 100 % 05/13/21 1119  Vitals shown include unvalidated device data.  Last Pain:  Vitals:   05/13/21 0849  TempSrc: Oral  PainSc: 2       Patients Stated Pain Goal: 4 (03/00/92 3300)  Complications: No notable events documented.

## 2021-05-13 NOTE — Anesthesia Procedure Notes (Signed)
Procedure Name: LMA Insertion Date/Time: 05/13/2021 10:12 AM Performed by: Willa Frater, CRNA Pre-anesthesia Checklist: Patient identified, Emergency Drugs available, Suction available and Patient being monitored Patient Re-evaluated:Patient Re-evaluated prior to induction Oxygen Delivery Method: Circle system utilized Preoxygenation: Pre-oxygenation with 100% oxygen Induction Type: IV induction Ventilation: Mask ventilation without difficulty LMA: LMA inserted LMA Size: 5.0 Number of attempts: 1 Airway Equipment and Method: Bite block Placement Confirmation: positive ETCO2 Tube secured with: Tape Dental Injury: Teeth and Oropharynx as per pre-operative assessment

## 2021-05-13 NOTE — Anesthesia Postprocedure Evaluation (Signed)
Anesthesia Post Note  Patient: KEYRI SALBERG  Procedure(s) Performed: DILATATION AND CURETTAGE /HYSTEROSCOPY AND POSSIBLE POLYPECTOMY (Uterus)     Patient location during evaluation: PACU Anesthesia Type: General Level of consciousness: awake and alert Pain management: pain level controlled Vital Signs Assessment: post-procedure vital signs reviewed and stable Respiratory status: spontaneous breathing, nonlabored ventilation and respiratory function stable Cardiovascular status: blood pressure returned to baseline and stable Postop Assessment: no apparent nausea or vomiting Anesthetic complications: no   No notable events documented.  Last Vitals:  Vitals:   05/13/21 1130 05/13/21 1145  BP: (!) 149/133 (!) 127/54  Pulse: 67 67  Resp: (!) 22 20  Temp:    SpO2: 100% 95%    Last Pain:  Vitals:   05/13/21 1145  TempSrc:   PainSc: 0-No pain                 Lidia Collum

## 2021-05-13 NOTE — Op Note (Signed)
05/13/2021  11:08 AM  PATIENT:  Jessica [name]  75 y.o. female  PRE-OPERATIVE DIAGNOSIS:  Post Menopausal Bleeding (N95.0)  POST-OPERATIVE DIAGNOSIS:  * No post-op diagnosis entered *  PROCEDURE:  Procedure(s): DILATATION AND CURETTAGE /HYSTEROSCOPY AND POSSIBLE POLYPECTOMY (N/A)  SURGEON:  Surgeon(s) and Role:    Christophe Louis, MD - Primary  PHYSICIAN ASSISTANT:   ASSISTANTS: none   ANESTHESIA:   general  EBL:  5 cc   BLOOD ADMINISTERED:none  DRAINS: none   LOCAL MEDICATIONS USED:  MARCAINE     SPECIMEN:  Source of Specimen:  endometrial polyp and curetting   DISPOSITION OF SPECIMEN:  PATHOLOGY  COUNTS:  YES  TOURNIQUET:  * No tourniquets in log *  DICTATION: .Note written in EPIC  PLAN OF CARE: Discharge to home after PACU  PATIENT DISPOSITION:  PACU - hemodynamically stable.   Delay start of Pharmacological VTE agent (>24hrs) due to surgical blood loss or risk of bleeding: not applicable  Findings: normal appearing cervix , atrophic appearing endometrium.. endometrial polyp located fundally   Procedure: Patient was taken to the operating room where she was placed under general anesthesia. She was placed in the dorsal lithotomy position. She was prepped and draped in the usual sterile fashion. A speculum was placed into the vaginal vault. The anterior lip of the cervix was grasped with a single-tooth tenaculum. Quarter percent Marcaine was injected at the 4 and 8:00 positions of the cervix. The uterus  was then sounded to 8 cm. The cervix was dilated to approximately 6 mm. Mysosure operative  hysteroscope was inserted. The findings noted above. Myosure reach  blade was introduced throught the hysteroscope. The endometrial mass was removed in 2 minute.  There was no evidence of perforation. .The hysteroscope was removed.  The single-tooth tenaculum was removed from the anterior lip of the cervix.  excellent hemostasis was noted. The speculum was removed from the  patient's vagina. She was awakened from anesthesia taken care  To the recovery  room awake and in stable condition. Sponge lap and needle counts were correct x2.

## 2021-05-13 NOTE — Anesthesia Procedure Notes (Signed)
Procedure Name: Intubation Date/Time: 05/13/2021 10:40 AM Performed by: Willa Frater, CRNA Pre-anesthesia Checklist: Patient identified, Emergency Drugs available, Suction available and Patient being monitored Patient Re-evaluated:Patient Re-evaluated prior to induction Oxygen Delivery Method: Circle system utilized Preoxygenation: Pre-oxygenation with 100% oxygen Induction Type: IV induction Ventilation: Mask ventilation without difficulty Laryngoscope Size: Glidescope and Mac Grade View: Grade III Tube type: Oral Number of attempts: 1 Airway Equipment and Method: Stylet and Oral airway Placement Confirmation: ETT inserted through vocal cords under direct vision, positive ETCO2 and breath sounds checked- equal and bilateral Secured at: 22 cm Tube secured with: Tape Dental Injury: Teeth and Oropharynx as per pre-operative assessment

## 2021-05-14 ENCOUNTER — Encounter (HOSPITAL_BASED_OUTPATIENT_CLINIC_OR_DEPARTMENT_OTHER): Payer: Self-pay | Admitting: Obstetrics and Gynecology

## 2021-05-14 LAB — SURGICAL PATHOLOGY

## 2021-05-22 DIAGNOSIS — Z20822 Contact with and (suspected) exposure to covid-19: Secondary | ICD-10-CM | POA: Diagnosis not present

## 2021-05-30 ENCOUNTER — Other Ambulatory Visit: Payer: Self-pay

## 2021-05-30 ENCOUNTER — Emergency Department (HOSPITAL_COMMUNITY)
Admission: EM | Admit: 2021-05-30 | Discharge: 2021-05-30 | Disposition: A | Discharge status: Home / Self Care | Payer: Medicare Other | Attending: Emergency Medicine | Admitting: Emergency Medicine

## 2021-05-30 ENCOUNTER — Encounter (HOSPITAL_COMMUNITY): Payer: Self-pay | Admitting: Emergency Medicine

## 2021-05-30 DIAGNOSIS — U071 COVID-19: Secondary | ICD-10-CM | POA: Insufficient documentation

## 2021-05-30 DIAGNOSIS — R58 Hemorrhage, not elsewhere classified: Secondary | ICD-10-CM | POA: Diagnosis not present

## 2021-05-30 DIAGNOSIS — Z96651 Presence of right artificial knee joint: Secondary | ICD-10-CM | POA: Insufficient documentation

## 2021-05-30 DIAGNOSIS — Z7984 Long term (current) use of oral hypoglycemic drugs: Secondary | ICD-10-CM | POA: Diagnosis not present

## 2021-05-30 DIAGNOSIS — N3000 Acute cystitis without hematuria: Secondary | ICD-10-CM | POA: Diagnosis not present

## 2021-05-30 DIAGNOSIS — E1169 Type 2 diabetes mellitus with other specified complication: Secondary | ICD-10-CM | POA: Insufficient documentation

## 2021-05-30 DIAGNOSIS — I110 Hypertensive heart disease with heart failure: Secondary | ICD-10-CM | POA: Insufficient documentation

## 2021-05-30 DIAGNOSIS — I509 Heart failure, unspecified: Secondary | ICD-10-CM | POA: Insufficient documentation

## 2021-05-30 DIAGNOSIS — Z794 Long term (current) use of insulin: Secondary | ICD-10-CM | POA: Diagnosis not present

## 2021-05-30 DIAGNOSIS — Z79899 Other long term (current) drug therapy: Secondary | ICD-10-CM | POA: Diagnosis not present

## 2021-05-30 DIAGNOSIS — E1140 Type 2 diabetes mellitus with diabetic neuropathy, unspecified: Secondary | ICD-10-CM | POA: Diagnosis not present

## 2021-05-30 DIAGNOSIS — E785 Hyperlipidemia, unspecified: Secondary | ICD-10-CM | POA: Diagnosis not present

## 2021-05-30 DIAGNOSIS — R040 Epistaxis: Secondary | ICD-10-CM

## 2021-05-30 DIAGNOSIS — E1136 Type 2 diabetes mellitus with diabetic cataract: Secondary | ICD-10-CM | POA: Insufficient documentation

## 2021-05-30 DIAGNOSIS — I10 Essential (primary) hypertension: Secondary | ICD-10-CM | POA: Diagnosis not present

## 2021-05-30 DIAGNOSIS — R059 Cough, unspecified: Secondary | ICD-10-CM | POA: Diagnosis present

## 2021-05-30 DIAGNOSIS — B9689 Other specified bacterial agents as the cause of diseases classified elsewhere: Secondary | ICD-10-CM | POA: Diagnosis not present

## 2021-05-30 LAB — URINALYSIS, ROUTINE W REFLEX MICROSCOPIC
Bilirubin Urine: NEGATIVE
Glucose, UA: >=500 mg/dL — AB
Ketones, ur: 20 mg/dL — AB
Nitrite: POSITIVE — AB
Protein, ur: 30 mg/dL — AB
RBC / HPF: >50 RBC/hpf — ABNORMAL HIGH (ref 0–5)
Specific Gravity, Urine: 1.022 (ref 1.005–1.030)
WBC, UA: >50 WBC/hpf — ABNORMAL HIGH (ref 0–5)
pH: 5 (ref 5.0–8.0)

## 2021-05-30 MED ORDER — SALINE SPRAY 0.65 % NA SOLN
1.0000 | Freq: Once | NASAL | Status: AC
  Administered 2021-05-30: 1 via NASAL
  Filled 2021-05-30: qty 44

## 2021-05-30 MED ORDER — CEPHALEXIN 500 MG PO CAPS: 500.0000 mg | ORAL_CAPSULE | Freq: Two times a day (BID) | ORAL | 0 refills | Status: AC

## 2021-05-30 MED ORDER — CEPHALEXIN 500 MG PO CAPS
500.0000 mg | ORAL_CAPSULE | Freq: Once | ORAL | Status: AC
  Administered 2021-05-30: 500 mg via ORAL
  Filled 2021-05-30: qty 1

## 2021-05-30 NOTE — ED Triage Notes (Signed)
Per EMS, patient from home, intermittent nosebleed since last night. Covid+ on 8/4. Afrin given with EMS. Nosebleed has resolved at this time.

## 2021-05-30 NOTE — ED Notes (Signed)
Pt in bed, respirations even and unlabored, family at bedside

## 2021-05-30 NOTE — ED Provider Notes (Signed)
[facility] DEPT Provider Note   CSN: [id number] Arrival date & time: 05/30/21  1134     History Chief Complaint  Patient presents with   Epistaxis   Covid Positive    Jessica [name] is a 75 y.o. female.  HPI Patient presents with concern for COVID and epistaxis.  She is here with her sister who assists with history. Patient notes that she was diagnosed with COVID 4 days ago after her sister was ill last week.  She notes that after having sinus congestion, headache, cough, she is gradually improved and today was feeling better.  However, over the past day she has developed intermittent nosebleed.  She arrives via EMS, and with pressure application by those individuals bleeding stopped prior to my evaluation.  Currently she denies complaints.    Past Medical History:  Diagnosis Date   Anemia    Arthritis    Cataract    Diabetes mellitus without complication (HCC)    Diastolic dysfunction    Frozen shoulder    Glaucoma    Heart murmur    History of kidney stones    Hyperlipidemia    Hypertension    Macular degeneration    Neuropathy    Obesity    Sleep apnea    Stroke [facility])    has been told by a Dr. Mort Sawyers that she may have had a mini stroke but no further testing was done to confirm   Tachycardia     Patient Active Problem List   Diagnosis Date Noted   Hyperlipidemia    Diarrhea    Scar 06/19/2019   Vaginal yeast infection 05/26/2019   At risk for adverse drug event 05/02/2019   CHF (congestive heart failure) ([place]) 05/01/2019   Pressure ulcer of sacral region, stage 2 ([place]) 05/01/2019   Displaced supracondylar fracture of distal end of right femur without intracondylar extension ([place]) 04/19/2019   Closed right femoral fracture ([place]) 04/17/2019   Sleep apnea    Glaucoma    Essential hypertension, benign 09/03/2014   Morbid obesity ([place]) 03/06/2014   HTN (hypertension) 03/06/2014   Vertigo 03/06/2014   Type 2 diabetes  mellitus ([place]) 03/06/2014   Pure hypercholesterolemia 03/06/2014    Past Surgical History:  Procedure Laterality Date   CARDIAC CATHETERIZATION     -6/09-normal ejection fraction 65%, no angiographically significant CAD., M. Skains MD   CESAREAN SECTION     COLONOSCOPY     foot spurs removed     HYSTEROSCOPY WITH D & C N/A 06/28/2017   Procedure: DILATATION AND CURETTAGE /HYSTEROSCOPY/POLYPECTOMY WITH MYOSURE;  Surgeon: Christophe Louis, MD;  Location: [facility];  Service: Gynecology;  Laterality: N/A;   HYSTEROSCOPY WITH D & C N/A 05/13/2021   Procedure: DILATATION AND CURETTAGE /HYSTEROSCOPY AND POSSIBLE POLYPECTOMY;  Surgeon: Christophe Louis, MD;  Location: [facility];  Service: Gynecology;  Laterality: N/A;   JOINT REPLACEMENT     right knee replaced   knee spurs removed     ORIF FEMUR FRACTURE Right 04/19/2019   Procedure: OPEN REDUCTION INTERNAL FIXATION (ORIF) DISTAL FEMUR FRACTURE;  Surgeon: Rod Can, MD;  Location: [facility];  Service: Orthopedics;  Laterality: Right;   shoulder spurs removed     TONSILLECTOMY AND ADENOIDECTOMY     UPPER GI ENDOSCOPY       OB History   No obstetric history on file.     Family History  Problem Relation Age of Onset   Colon polyps Father  Brain cancer Mother     Social History   Tobacco Use   Smoking status: Never   Smokeless tobacco: Never  Vaping Use   Vaping Use: Never used  Substance Use Topics   Alcohol use: Yes    Comment: rarely   Drug use: No    Home Medications Prior to Admission medications   Medication Sig Start Date End Date Taking? Authorizing Provider  cephALEXin (KEFLEX) 500 MG capsule Take 1 capsule (500 mg total) by mouth 2 (two) times daily for 5 days. 05/30/21 06/04/21 Yes Carmin Muskrat, MD  Cholecalciferol (VITAMIN D3) 50 MCG (2000 UT) capsule Take 2,000 Units by mouth daily.   Yes [provider]  Coenzyme Q10 (CO Q-10) 100 MG CAPS Take 100 mg by mouth at bedtime.    Yes [provider]  dorzolamidel-timolol (COSOPT) 22.3-6.8 MG/ML SOLN ophthalmic solution Place 1 drop into both eyes 2 (two) times daily.   Yes [provider]  furosemide (LASIX) 20 MG tablet Take 1 tablet (20 mg total) by mouth daily. 11/03/20  Yes Jerline Pain, MD  ibuprofen (ADVIL) 600 MG tablet Take 1 tablet (600 mg total) by mouth every 6 (six) hours as needed. Patient taking differently: Take 600 mg by mouth every 6 (six) hours as needed for mild pain. 05/13/21  Yes Christophe Louis, MD  insulin lispro (HUMALOG) 100 UNIT/ML injection Insulin pump Patient taking differently: Inject 3 Units into the skin continuous. Insulin pump. Maximum daily dose - 200 units 07/26/19  Yes Medina-Vargas, Monina C, NP  INVOKANA 300 MG TABS tablet Take 1 tablet (300 mg total) by mouth daily. 07/26/19  Yes Medina-Vargas, Monina C, NP  Iron-Vitamin C 65-125 MG TABS Take 1 tablet by mouth at bedtime.    Yes [provider]  melatonin 5 MG TABS Take 5 mg by mouth at bedtime.   Yes [provider]  metFORMIN (GLUCOPHAGE-XR) 500 MG 24 hr tablet Take 1 tablet (500 mg total) by mouth at bedtime. 07/26/19  Yes Medina-Vargas, Monina C, NP  metoprolol tartrate (LOPRESSOR) 100 MG tablet TAKE 1 TABLET TWICE A DAY Patient taking differently: Take 100 mg by mouth 2 (two) times daily. 01/13/21  Yes Jerline Pain, MD  Multiple Vitamin (MULTIVITAMIN WITH MINERALS) TABS tablet Take 1 tablet by mouth daily.   Yes [provider]  Multiple Vitamins-Minerals (PRESERVISION AREDS 2 PO) Take 1 tablet by mouth 2 (two) times daily.   Yes [provider]  olmesartan (BENICAR) 40 MG tablet Take 40 mg by mouth daily. 02/09/21  Yes [provider]  oxybutynin (DITROPAN-XL) 5 MG 24 hr tablet Take 5 mg by mouth at bedtime.   Yes [provider]  phenazopyridine (PYRIDIUM) 95 MG tablet Take 95 mg by mouth in the morning and at bedtime.   Yes [provider]  simvastatin (ZOCOR) 40 MG  tablet Take 1 tablet (40 mg total) by mouth every evening. 07/26/19  Yes Medina-Vargas, Monina C, NP  spironolactone (ALDACTONE) 25 MG tablet TAKE 1 TABLET DAILY Patient taking differently: Take 25 mg by mouth daily. 11/24/20  Yes Jerline Pain, MD  Travoprost, BAK Free, (TRAVATAN) 0.004 % SOLN ophthalmic solution Place 1 drop into both eyes at bedtime.    Yes [provider]    Allergies    Meloxicam, Percocet [oxycodone-acetaminophen], Tramadol, Vicodin [hydrocodone-acetaminophen], Azithromycin, Iodine, and Shellfish allergy  Review of Systems   Review of Systems  Constitutional:        Per HPI, otherwise  negative  HENT:         Per HPI, otherwise negative  Respiratory:         Per HPI, otherwise negative  Cardiovascular:        Per HPI, otherwise negative  Gastrointestinal:  Negative for vomiting.  Endocrine:       Negative aside from HPI  Genitourinary:        Neg aside from HPI   Musculoskeletal:        Per HPI, otherwise negative  Skin: Negative.   Neurological:  Negative for syncope.   Physical Exam Updated Vital Signs BP (!) 144/65 (BP Location: Left Wrist)   Pulse (!) 110   Temp 98.7 F (37.1 C) (Oral)   Resp 16   LMP  (LMP Unknown)   SpO2 97%   Physical Exam Vitals and nursing note reviewed.  Constitutional:      General: She is not in acute distress.    Appearance: She is well-developed. She is obese.  HENT:     Head: Normocephalic and atraumatic.     Nose:   Eyes:     Conjunctiva/sclera: Conjunctivae normal.  Cardiovascular:     Rate and Rhythm: Normal rate and regular rhythm.  Pulmonary:     Effort: Pulmonary effort is normal. No respiratory distress.     Breath sounds: Normal breath sounds. No stridor.  Abdominal:     General: There is no distension.  Skin:    General: Skin is warm and dry.  Neurological:     Mental Status: She is alert and oriented to person, place, and time.     Cranial Nerves: No cranial nerve deficit.    ED  Results / Procedures / Treatments   Labs (all labs ordered are listed, but only abnormal results are displayed) Labs Reviewed  URINALYSIS, ROUTINE W REFLEX MICROSCOPIC - Abnormal; Notable for the following components:      Result Value   APPearance CLOUDY (*)    Glucose, UA >=500 (*)    Hgb urine dipstick LARGE (*)    Ketones, ur 20 (*)    Protein, ur 30 (*)    Nitrite POSITIVE (*)    Leukocytes,Ua LARGE (*)    RBC / HPF >50 (*)    WBC, UA >50 (*)    Bacteria, UA MANY (*)    All other components within normal limits    EKG None  Radiology No results found.  Procedures Procedures   Medications Ordered in ED Medications  sodium chloride (OCEAN) 0.65 % nasal spray 1 spray (1 spray Each Nare Given 05/30/21 1530)  cephALEXin (KEFLEX) capsule 500 mg (500 mg Oral Given 05/30/21 1530)    ED Course  I have reviewed the triage vital signs and the nursing notes.  Pertinent labs & imaging results that were available during my care of the patient were reviewed by me and considered in my medical decision making (see chart for details).  On repeat exam after period of monitoring, no recurrence of her nosebleed the patient now states that she has a dysuria has concern from prior urinary tract infection  3:44 PM Urinalysis consistent with infection, culture reviewed, prior susceptibility to Keflex noted.  On repeat exam she remains in no distress, no active bleeding.    MDM Rules/Calculators/A&P Adult female with recent COVID diagnosis no evidence for respiratory compromise, no fever, no hypotension suggesting bacteremia, sepsis, decompensated state presents with new nosebleed, hemostatic, as well as eventual dysuria.  Patient received appropriate meds, monitoring,  was discharged in stable condition. Final Clinical Impression(s) / ED Diagnoses Final diagnoses:  Epistaxis  Acute cystitis without hematuria    Rx / DC Orders ED Discharge Orders          Ordered    cephALEXin (KEFLEX)  500 MG capsule  2 times daily        05/30/21 1528             Carmin Muskrat, MD 05/30/21 1545

## 2021-05-30 NOTE — ED Notes (Signed)
Pt was able to ambulate with assistance of walker to restroom and back to room.

## 2021-05-30 NOTE — Discharge Instructions (Addendum)
As discussed, your evaluation today has been largely reassuring.  But, it is important that you monitor your condition carefully, and do not hesitate to return to the ED if you develop new, or concerning changes in your condition.  Please obtain and use the scribed antibiotic.  In addition, please use the [place] nasal spray 3 times daily for the next 5 days.  Otherwise, please follow-up with your physician for appropriate ongoing care.

## 2021-08-17 DIAGNOSIS — Z20822 Contact with and (suspected) exposure to covid-19: Secondary | ICD-10-CM | POA: Diagnosis not present

## 2021-08-17 DIAGNOSIS — E782 Mixed hyperlipidemia: Secondary | ICD-10-CM | POA: Diagnosis not present

## 2021-08-19 DIAGNOSIS — H4089 Other specified glaucoma: Secondary | ICD-10-CM | POA: Diagnosis not present

## 2021-08-19 DIAGNOSIS — E782 Mixed hyperlipidemia: Secondary | ICD-10-CM | POA: Diagnosis not present

## 2021-08-19 DIAGNOSIS — I10 Essential (primary) hypertension: Secondary | ICD-10-CM | POA: Diagnosis not present

## 2021-08-19 DIAGNOSIS — G4733 Obstructive sleep apnea (adult) (pediatric): Secondary | ICD-10-CM | POA: Diagnosis not present

## 2021-08-19 DIAGNOSIS — B372 Candidiasis of skin and nail: Secondary | ICD-10-CM | POA: Diagnosis not present

## 2021-08-19 DIAGNOSIS — Z23 Encounter for immunization: Secondary | ICD-10-CM | POA: Diagnosis not present

## 2021-08-19 DIAGNOSIS — E1140 Type 2 diabetes mellitus with diabetic neuropathy, unspecified: Secondary | ICD-10-CM | POA: Diagnosis not present

## 2021-08-19 DIAGNOSIS — N3946 Mixed incontinence: Secondary | ICD-10-CM | POA: Diagnosis not present

## 2021-08-19 DIAGNOSIS — G479 Sleep disorder, unspecified: Secondary | ICD-10-CM | POA: Diagnosis not present

## 2021-08-19 DIAGNOSIS — Z7984 Long term (current) use of oral hypoglycemic drugs: Secondary | ICD-10-CM | POA: Diagnosis not present

## 2021-08-19 DIAGNOSIS — I700 Atherosclerosis of aorta: Secondary | ICD-10-CM | POA: Diagnosis not present

## 2021-08-27 DIAGNOSIS — Z0000 Encounter for general adult medical examination without abnormal findings: Secondary | ICD-10-CM | POA: Diagnosis not present

## 2021-08-27 DIAGNOSIS — Z1389 Encounter for screening for other disorder: Secondary | ICD-10-CM | POA: Diagnosis not present

## 2021-08-31 DIAGNOSIS — E1165 Type 2 diabetes mellitus with hyperglycemia: Secondary | ICD-10-CM | POA: Diagnosis not present

## 2021-08-31 DIAGNOSIS — E669 Obesity, unspecified: Secondary | ICD-10-CM | POA: Diagnosis not present

## 2021-08-31 DIAGNOSIS — E7800 Pure hypercholesterolemia, unspecified: Secondary | ICD-10-CM | POA: Diagnosis not present

## 2021-08-31 DIAGNOSIS — G609 Hereditary and idiopathic neuropathy, unspecified: Secondary | ICD-10-CM | POA: Diagnosis not present

## 2021-08-31 DIAGNOSIS — Z9641 Presence of insulin pump (external) (internal): Secondary | ICD-10-CM | POA: Diagnosis not present

## 2021-08-31 DIAGNOSIS — I10 Essential (primary) hypertension: Secondary | ICD-10-CM | POA: Diagnosis not present

## 2021-09-01 DIAGNOSIS — H401111 Primary open-angle glaucoma, right eye, mild stage: Secondary | ICD-10-CM | POA: Diagnosis not present

## 2021-09-01 DIAGNOSIS — E113291 Type 2 diabetes mellitus with mild nonproliferative diabetic retinopathy without macular edema, right eye: Secondary | ICD-10-CM | POA: Diagnosis not present

## 2021-09-01 DIAGNOSIS — H401122 Primary open-angle glaucoma, left eye, moderate stage: Secondary | ICD-10-CM | POA: Diagnosis not present

## 2021-09-01 DIAGNOSIS — H2513 Age-related nuclear cataract, bilateral: Secondary | ICD-10-CM | POA: Diagnosis not present

## 2021-09-04 ENCOUNTER — Other Ambulatory Visit (HOSPITAL_COMMUNITY): Payer: Self-pay

## 2021-09-04 MED ORDER — OZEMPIC (0.25 OR 0.5 MG/DOSE) 2 MG/1.5ML ~~LOC~~ SOPN
PEN_INJECTOR | SUBCUTANEOUS | 3 refills | Status: DC
  Filled 2021-09-04: qty 1.5, 42d supply, fill #0

## 2021-09-11 DIAGNOSIS — D23121 Other benign neoplasm of skin of left upper eyelid, including canthus: Secondary | ICD-10-CM | POA: Diagnosis not present

## 2021-10-01 DIAGNOSIS — Z20822 Contact with and (suspected) exposure to covid-19: Secondary | ICD-10-CM | POA: Diagnosis not present

## 2021-10-16 ENCOUNTER — Other Ambulatory Visit: Payer: Self-pay | Admitting: Cardiology

## 2021-10-16 DIAGNOSIS — I5032 Chronic diastolic (congestive) heart failure: Secondary | ICD-10-CM

## 2021-10-31 ENCOUNTER — Other Ambulatory Visit: Payer: Self-pay | Admitting: Cardiology

## 2021-10-31 DIAGNOSIS — I5032 Chronic diastolic (congestive) heart failure: Secondary | ICD-10-CM

## 2021-11-19 DIAGNOSIS — E669 Obesity, unspecified: Secondary | ICD-10-CM | POA: Diagnosis not present

## 2021-11-19 DIAGNOSIS — E7800 Pure hypercholesterolemia, unspecified: Secondary | ICD-10-CM | POA: Diagnosis not present

## 2021-11-19 DIAGNOSIS — G609 Hereditary and idiopathic neuropathy, unspecified: Secondary | ICD-10-CM | POA: Diagnosis not present

## 2021-11-19 DIAGNOSIS — Z9641 Presence of insulin pump (external) (internal): Secondary | ICD-10-CM | POA: Diagnosis not present

## 2021-11-19 DIAGNOSIS — I10 Essential (primary) hypertension: Secondary | ICD-10-CM | POA: Diagnosis not present

## 2021-11-19 DIAGNOSIS — E1165 Type 2 diabetes mellitus with hyperglycemia: Secondary | ICD-10-CM | POA: Diagnosis not present

## 2021-11-20 DIAGNOSIS — Z20822 Contact with and (suspected) exposure to covid-19: Secondary | ICD-10-CM | POA: Diagnosis not present

## 2021-12-18 DIAGNOSIS — M7542 Impingement syndrome of left shoulder: Secondary | ICD-10-CM | POA: Diagnosis not present

## 2021-12-18 DIAGNOSIS — R269 Unspecified abnormalities of gait and mobility: Secondary | ICD-10-CM | POA: Diagnosis not present

## 2021-12-18 DIAGNOSIS — R5381 Other malaise: Secondary | ICD-10-CM | POA: Diagnosis not present

## 2021-12-18 DIAGNOSIS — R296 Repeated falls: Secondary | ICD-10-CM | POA: Diagnosis not present

## 2021-12-20 DIAGNOSIS — Z9181 History of falling: Secondary | ICD-10-CM | POA: Diagnosis not present

## 2021-12-20 DIAGNOSIS — I2510 Atherosclerotic heart disease of native coronary artery without angina pectoris: Secondary | ICD-10-CM | POA: Diagnosis not present

## 2021-12-20 DIAGNOSIS — E669 Obesity, unspecified: Secondary | ICD-10-CM | POA: Diagnosis not present

## 2021-12-20 DIAGNOSIS — H3530 Unspecified macular degeneration: Secondary | ICD-10-CM | POA: Diagnosis not present

## 2021-12-20 DIAGNOSIS — Z8781 Personal history of (healed) traumatic fracture: Secondary | ICD-10-CM | POA: Diagnosis not present

## 2021-12-20 DIAGNOSIS — M7542 Impingement syndrome of left shoulder: Secondary | ICD-10-CM | POA: Diagnosis not present

## 2021-12-20 DIAGNOSIS — Z794 Long term (current) use of insulin: Secondary | ICD-10-CM | POA: Diagnosis not present

## 2021-12-20 DIAGNOSIS — M1990 Unspecified osteoarthritis, unspecified site: Secondary | ICD-10-CM | POA: Diagnosis not present

## 2021-12-20 DIAGNOSIS — M75102 Unspecified rotator cuff tear or rupture of left shoulder, not specified as traumatic: Secondary | ICD-10-CM | POA: Diagnosis not present

## 2021-12-20 DIAGNOSIS — Z7984 Long term (current) use of oral hypoglycemic drugs: Secondary | ICD-10-CM | POA: Diagnosis not present

## 2021-12-20 DIAGNOSIS — D509 Iron deficiency anemia, unspecified: Secondary | ICD-10-CM | POA: Diagnosis not present

## 2021-12-20 DIAGNOSIS — E785 Hyperlipidemia, unspecified: Secondary | ICD-10-CM | POA: Diagnosis not present

## 2021-12-20 DIAGNOSIS — R296 Repeated falls: Secondary | ICD-10-CM | POA: Diagnosis not present

## 2021-12-20 DIAGNOSIS — W19XXXD Unspecified fall, subsequent encounter: Secondary | ICD-10-CM | POA: Diagnosis not present

## 2021-12-20 DIAGNOSIS — G4730 Sleep apnea, unspecified: Secondary | ICD-10-CM | POA: Diagnosis not present

## 2021-12-20 DIAGNOSIS — E119 Type 2 diabetes mellitus without complications: Secondary | ICD-10-CM | POA: Diagnosis not present

## 2021-12-20 DIAGNOSIS — I10 Essential (primary) hypertension: Secondary | ICD-10-CM | POA: Diagnosis not present

## 2021-12-20 DIAGNOSIS — Z6841 Body Mass Index (BMI) 40.0 and over, adult: Secondary | ICD-10-CM | POA: Diagnosis not present

## 2021-12-21 DIAGNOSIS — Z20822 Contact with and (suspected) exposure to covid-19: Secondary | ICD-10-CM | POA: Diagnosis not present

## 2021-12-22 DIAGNOSIS — M1990 Unspecified osteoarthritis, unspecified site: Secondary | ICD-10-CM | POA: Diagnosis not present

## 2021-12-22 DIAGNOSIS — W19XXXD Unspecified fall, subsequent encounter: Secondary | ICD-10-CM | POA: Diagnosis not present

## 2021-12-22 DIAGNOSIS — R296 Repeated falls: Secondary | ICD-10-CM | POA: Diagnosis not present

## 2021-12-22 DIAGNOSIS — M7542 Impingement syndrome of left shoulder: Secondary | ICD-10-CM | POA: Diagnosis not present

## 2021-12-22 DIAGNOSIS — E119 Type 2 diabetes mellitus without complications: Secondary | ICD-10-CM | POA: Diagnosis not present

## 2021-12-22 DIAGNOSIS — M75102 Unspecified rotator cuff tear or rupture of left shoulder, not specified as traumatic: Secondary | ICD-10-CM | POA: Diagnosis not present

## 2021-12-25 DIAGNOSIS — E119 Type 2 diabetes mellitus without complications: Secondary | ICD-10-CM | POA: Diagnosis not present

## 2021-12-25 DIAGNOSIS — R296 Repeated falls: Secondary | ICD-10-CM | POA: Diagnosis not present

## 2021-12-25 DIAGNOSIS — M1990 Unspecified osteoarthritis, unspecified site: Secondary | ICD-10-CM | POA: Diagnosis not present

## 2021-12-25 DIAGNOSIS — M7542 Impingement syndrome of left shoulder: Secondary | ICD-10-CM | POA: Diagnosis not present

## 2021-12-25 DIAGNOSIS — M75102 Unspecified rotator cuff tear or rupture of left shoulder, not specified as traumatic: Secondary | ICD-10-CM | POA: Diagnosis not present

## 2021-12-25 DIAGNOSIS — W19XXXD Unspecified fall, subsequent encounter: Secondary | ICD-10-CM | POA: Diagnosis not present

## 2021-12-29 DIAGNOSIS — M7542 Impingement syndrome of left shoulder: Secondary | ICD-10-CM | POA: Diagnosis not present

## 2021-12-29 DIAGNOSIS — E119 Type 2 diabetes mellitus without complications: Secondary | ICD-10-CM | POA: Diagnosis not present

## 2021-12-29 DIAGNOSIS — M1990 Unspecified osteoarthritis, unspecified site: Secondary | ICD-10-CM | POA: Diagnosis not present

## 2021-12-29 DIAGNOSIS — M75102 Unspecified rotator cuff tear or rupture of left shoulder, not specified as traumatic: Secondary | ICD-10-CM | POA: Diagnosis not present

## 2021-12-29 DIAGNOSIS — R296 Repeated falls: Secondary | ICD-10-CM | POA: Diagnosis not present

## 2021-12-29 DIAGNOSIS — W19XXXD Unspecified fall, subsequent encounter: Secondary | ICD-10-CM | POA: Diagnosis not present

## 2022-01-05 DIAGNOSIS — E119 Type 2 diabetes mellitus without complications: Secondary | ICD-10-CM | POA: Diagnosis not present

## 2022-01-05 DIAGNOSIS — M1990 Unspecified osteoarthritis, unspecified site: Secondary | ICD-10-CM | POA: Diagnosis not present

## 2022-01-05 DIAGNOSIS — M7542 Impingement syndrome of left shoulder: Secondary | ICD-10-CM | POA: Diagnosis not present

## 2022-01-05 DIAGNOSIS — R296 Repeated falls: Secondary | ICD-10-CM | POA: Diagnosis not present

## 2022-01-05 DIAGNOSIS — M75102 Unspecified rotator cuff tear or rupture of left shoulder, not specified as traumatic: Secondary | ICD-10-CM | POA: Diagnosis not present

## 2022-01-05 DIAGNOSIS — W19XXXD Unspecified fall, subsequent encounter: Secondary | ICD-10-CM | POA: Diagnosis not present

## 2022-01-06 DIAGNOSIS — Z20822 Contact with and (suspected) exposure to covid-19: Secondary | ICD-10-CM | POA: Diagnosis not present

## 2022-01-12 DIAGNOSIS — Z20822 Contact with and (suspected) exposure to covid-19: Secondary | ICD-10-CM | POA: Diagnosis not present

## 2022-01-14 DIAGNOSIS — M7542 Impingement syndrome of left shoulder: Secondary | ICD-10-CM | POA: Diagnosis not present

## 2022-01-14 DIAGNOSIS — M75102 Unspecified rotator cuff tear or rupture of left shoulder, not specified as traumatic: Secondary | ICD-10-CM | POA: Diagnosis not present

## 2022-01-14 DIAGNOSIS — R296 Repeated falls: Secondary | ICD-10-CM | POA: Diagnosis not present

## 2022-01-14 DIAGNOSIS — M1990 Unspecified osteoarthritis, unspecified site: Secondary | ICD-10-CM | POA: Diagnosis not present

## 2022-01-14 DIAGNOSIS — E119 Type 2 diabetes mellitus without complications: Secondary | ICD-10-CM | POA: Diagnosis not present

## 2022-01-14 DIAGNOSIS — W19XXXD Unspecified fall, subsequent encounter: Secondary | ICD-10-CM | POA: Diagnosis not present

## 2022-01-18 DIAGNOSIS — M7542 Impingement syndrome of left shoulder: Secondary | ICD-10-CM | POA: Diagnosis not present

## 2022-01-18 DIAGNOSIS — E119 Type 2 diabetes mellitus without complications: Secondary | ICD-10-CM | POA: Diagnosis not present

## 2022-01-18 DIAGNOSIS — M1990 Unspecified osteoarthritis, unspecified site: Secondary | ICD-10-CM | POA: Diagnosis not present

## 2022-01-18 DIAGNOSIS — M75102 Unspecified rotator cuff tear or rupture of left shoulder, not specified as traumatic: Secondary | ICD-10-CM | POA: Diagnosis not present

## 2022-01-18 DIAGNOSIS — W19XXXD Unspecified fall, subsequent encounter: Secondary | ICD-10-CM | POA: Diagnosis not present

## 2022-01-18 DIAGNOSIS — R296 Repeated falls: Secondary | ICD-10-CM | POA: Diagnosis not present

## 2022-01-19 DIAGNOSIS — E785 Hyperlipidemia, unspecified: Secondary | ICD-10-CM | POA: Diagnosis not present

## 2022-01-19 DIAGNOSIS — H3530 Unspecified macular degeneration: Secondary | ICD-10-CM | POA: Diagnosis not present

## 2022-01-19 DIAGNOSIS — Z8781 Personal history of (healed) traumatic fracture: Secondary | ICD-10-CM | POA: Diagnosis not present

## 2022-01-19 DIAGNOSIS — R296 Repeated falls: Secondary | ICD-10-CM | POA: Diagnosis not present

## 2022-01-19 DIAGNOSIS — Z794 Long term (current) use of insulin: Secondary | ICD-10-CM | POA: Diagnosis not present

## 2022-01-19 DIAGNOSIS — D509 Iron deficiency anemia, unspecified: Secondary | ICD-10-CM | POA: Diagnosis not present

## 2022-01-19 DIAGNOSIS — M7542 Impingement syndrome of left shoulder: Secondary | ICD-10-CM | POA: Diagnosis not present

## 2022-01-19 DIAGNOSIS — Z9181 History of falling: Secondary | ICD-10-CM | POA: Diagnosis not present

## 2022-01-19 DIAGNOSIS — M75102 Unspecified rotator cuff tear or rupture of left shoulder, not specified as traumatic: Secondary | ICD-10-CM | POA: Diagnosis not present

## 2022-01-19 DIAGNOSIS — I2510 Atherosclerotic heart disease of native coronary artery without angina pectoris: Secondary | ICD-10-CM | POA: Diagnosis not present

## 2022-01-19 DIAGNOSIS — G4730 Sleep apnea, unspecified: Secondary | ICD-10-CM | POA: Diagnosis not present

## 2022-01-19 DIAGNOSIS — Z6841 Body Mass Index (BMI) 40.0 and over, adult: Secondary | ICD-10-CM | POA: Diagnosis not present

## 2022-01-19 DIAGNOSIS — M1990 Unspecified osteoarthritis, unspecified site: Secondary | ICD-10-CM | POA: Diagnosis not present

## 2022-01-19 DIAGNOSIS — E119 Type 2 diabetes mellitus without complications: Secondary | ICD-10-CM | POA: Diagnosis not present

## 2022-01-19 DIAGNOSIS — E669 Obesity, unspecified: Secondary | ICD-10-CM | POA: Diagnosis not present

## 2022-01-19 DIAGNOSIS — Z20822 Contact with and (suspected) exposure to covid-19: Secondary | ICD-10-CM | POA: Diagnosis not present

## 2022-01-19 DIAGNOSIS — W19XXXD Unspecified fall, subsequent encounter: Secondary | ICD-10-CM | POA: Diagnosis not present

## 2022-01-19 DIAGNOSIS — I10 Essential (primary) hypertension: Secondary | ICD-10-CM | POA: Diagnosis not present

## 2022-01-19 DIAGNOSIS — Z7984 Long term (current) use of oral hypoglycemic drugs: Secondary | ICD-10-CM | POA: Diagnosis not present

## 2022-01-21 ENCOUNTER — Other Ambulatory Visit: Payer: Self-pay | Admitting: Cardiology

## 2022-01-21 DIAGNOSIS — I5032 Chronic diastolic (congestive) heart failure: Secondary | ICD-10-CM

## 2022-01-26 DIAGNOSIS — E119 Type 2 diabetes mellitus without complications: Secondary | ICD-10-CM | POA: Diagnosis not present

## 2022-01-26 DIAGNOSIS — M75102 Unspecified rotator cuff tear or rupture of left shoulder, not specified as traumatic: Secondary | ICD-10-CM | POA: Diagnosis not present

## 2022-01-26 DIAGNOSIS — Z20822 Contact with and (suspected) exposure to covid-19: Secondary | ICD-10-CM | POA: Diagnosis not present

## 2022-01-26 DIAGNOSIS — W19XXXD Unspecified fall, subsequent encounter: Secondary | ICD-10-CM | POA: Diagnosis not present

## 2022-01-26 DIAGNOSIS — M7542 Impingement syndrome of left shoulder: Secondary | ICD-10-CM | POA: Diagnosis not present

## 2022-01-26 DIAGNOSIS — R296 Repeated falls: Secondary | ICD-10-CM | POA: Diagnosis not present

## 2022-01-26 DIAGNOSIS — M1990 Unspecified osteoarthritis, unspecified site: Secondary | ICD-10-CM | POA: Diagnosis not present

## 2022-01-28 DIAGNOSIS — Z20822 Contact with and (suspected) exposure to covid-19: Secondary | ICD-10-CM | POA: Diagnosis not present

## 2022-02-01 ENCOUNTER — Telehealth: Payer: Self-pay | Admitting: Cardiology

## 2022-02-01 DIAGNOSIS — I10 Essential (primary) hypertension: Secondary | ICD-10-CM

## 2022-02-01 DIAGNOSIS — I5032 Chronic diastolic (congestive) heart failure: Secondary | ICD-10-CM

## 2022-02-01 MED ORDER — METOPROLOL TARTRATE 100 MG PO TABS: 100.0000 mg | ORAL_TABLET | Freq: Two times a day (BID) | ORAL | 0 refills | Status: DC

## 2022-02-01 NOTE — Telephone Encounter (Signed)
?*  STAT* If patient is at the pharmacy, call can be transferred to refill team. ? ? ?1. Which medications need to be refilled? (please list name of each medication and dose if known)  ?metoprolol tartrate (LOPRESSOR) 100 MG tablet ? ?2. Which pharmacy/location (including street and city if local pharmacy) is medication to be sent to? ?[id number] - [place], [address] AT [facility] ? ?3. Do they need a 30 day or 90 day supply?  ? ?90 day supply ? ?

## 2022-02-01 NOTE — Telephone Encounter (Signed)
Pt's medication was sent to pt's pharmacy as requested. Confirmation received.  °

## 2022-02-02 DIAGNOSIS — W19XXXD Unspecified fall, subsequent encounter: Secondary | ICD-10-CM | POA: Diagnosis not present

## 2022-02-02 DIAGNOSIS — M1990 Unspecified osteoarthritis, unspecified site: Secondary | ICD-10-CM | POA: Diagnosis not present

## 2022-02-02 DIAGNOSIS — Z20828 Contact with and (suspected) exposure to other viral communicable diseases: Secondary | ICD-10-CM | POA: Diagnosis not present

## 2022-02-02 DIAGNOSIS — E119 Type 2 diabetes mellitus without complications: Secondary | ICD-10-CM | POA: Diagnosis not present

## 2022-02-02 DIAGNOSIS — M7542 Impingement syndrome of left shoulder: Secondary | ICD-10-CM | POA: Diagnosis not present

## 2022-02-02 DIAGNOSIS — R296 Repeated falls: Secondary | ICD-10-CM | POA: Diagnosis not present

## 2022-02-02 DIAGNOSIS — Z1152 Encounter for screening for COVID-19: Secondary | ICD-10-CM | POA: Diagnosis not present

## 2022-02-02 DIAGNOSIS — M75102 Unspecified rotator cuff tear or rupture of left shoulder, not specified as traumatic: Secondary | ICD-10-CM | POA: Diagnosis not present

## 2022-02-03 DIAGNOSIS — Z20822 Contact with and (suspected) exposure to covid-19: Secondary | ICD-10-CM | POA: Diagnosis not present

## 2022-02-10 DIAGNOSIS — M7542 Impingement syndrome of left shoulder: Secondary | ICD-10-CM | POA: Diagnosis not present

## 2022-02-10 DIAGNOSIS — R296 Repeated falls: Secondary | ICD-10-CM | POA: Diagnosis not present

## 2022-02-10 DIAGNOSIS — M75102 Unspecified rotator cuff tear or rupture of left shoulder, not specified as traumatic: Secondary | ICD-10-CM | POA: Diagnosis not present

## 2022-02-10 DIAGNOSIS — W19XXXD Unspecified fall, subsequent encounter: Secondary | ICD-10-CM | POA: Diagnosis not present

## 2022-02-10 DIAGNOSIS — E119 Type 2 diabetes mellitus without complications: Secondary | ICD-10-CM | POA: Diagnosis not present

## 2022-02-10 DIAGNOSIS — M1990 Unspecified osteoarthritis, unspecified site: Secondary | ICD-10-CM | POA: Diagnosis not present

## 2022-02-15 DIAGNOSIS — W19XXXD Unspecified fall, subsequent encounter: Secondary | ICD-10-CM | POA: Diagnosis not present

## 2022-02-15 DIAGNOSIS — R296 Repeated falls: Secondary | ICD-10-CM | POA: Diagnosis not present

## 2022-02-15 DIAGNOSIS — M1990 Unspecified osteoarthritis, unspecified site: Secondary | ICD-10-CM | POA: Diagnosis not present

## 2022-02-15 DIAGNOSIS — M7542 Impingement syndrome of left shoulder: Secondary | ICD-10-CM | POA: Diagnosis not present

## 2022-02-15 DIAGNOSIS — E119 Type 2 diabetes mellitus without complications: Secondary | ICD-10-CM | POA: Diagnosis not present

## 2022-02-15 DIAGNOSIS — M75102 Unspecified rotator cuff tear or rupture of left shoulder, not specified as traumatic: Secondary | ICD-10-CM | POA: Diagnosis not present

## 2022-02-16 DIAGNOSIS — E782 Mixed hyperlipidemia: Secondary | ICD-10-CM | POA: Diagnosis not present

## 2022-02-18 DIAGNOSIS — G4730 Sleep apnea, unspecified: Secondary | ICD-10-CM | POA: Diagnosis not present

## 2022-02-18 DIAGNOSIS — Z8781 Personal history of (healed) traumatic fracture: Secondary | ICD-10-CM | POA: Diagnosis not present

## 2022-02-18 DIAGNOSIS — Z794 Long term (current) use of insulin: Secondary | ICD-10-CM | POA: Diagnosis not present

## 2022-02-18 DIAGNOSIS — M75102 Unspecified rotator cuff tear or rupture of left shoulder, not specified as traumatic: Secondary | ICD-10-CM | POA: Diagnosis not present

## 2022-02-18 DIAGNOSIS — R296 Repeated falls: Secondary | ICD-10-CM | POA: Diagnosis not present

## 2022-02-18 DIAGNOSIS — Z9181 History of falling: Secondary | ICD-10-CM | POA: Diagnosis not present

## 2022-02-18 DIAGNOSIS — E785 Hyperlipidemia, unspecified: Secondary | ICD-10-CM | POA: Diagnosis not present

## 2022-02-18 DIAGNOSIS — E1140 Type 2 diabetes mellitus with diabetic neuropathy, unspecified: Secondary | ICD-10-CM | POA: Diagnosis not present

## 2022-02-18 DIAGNOSIS — I10 Essential (primary) hypertension: Secondary | ICD-10-CM | POA: Diagnosis not present

## 2022-02-18 DIAGNOSIS — B372 Candidiasis of skin and nail: Secondary | ICD-10-CM | POA: Diagnosis not present

## 2022-02-18 DIAGNOSIS — E782 Mixed hyperlipidemia: Secondary | ICD-10-CM | POA: Diagnosis not present

## 2022-02-18 DIAGNOSIS — G4733 Obstructive sleep apnea (adult) (pediatric): Secondary | ICD-10-CM | POA: Diagnosis not present

## 2022-02-18 DIAGNOSIS — G479 Sleep disorder, unspecified: Secondary | ICD-10-CM | POA: Diagnosis not present

## 2022-02-18 DIAGNOSIS — H4089 Other specified glaucoma: Secondary | ICD-10-CM | POA: Diagnosis not present

## 2022-02-18 DIAGNOSIS — Z6841 Body Mass Index (BMI) 40.0 and over, adult: Secondary | ICD-10-CM | POA: Diagnosis not present

## 2022-02-18 DIAGNOSIS — H3530 Unspecified macular degeneration: Secondary | ICD-10-CM | POA: Diagnosis not present

## 2022-02-18 DIAGNOSIS — D509 Iron deficiency anemia, unspecified: Secondary | ICD-10-CM | POA: Diagnosis not present

## 2022-02-18 DIAGNOSIS — E669 Obesity, unspecified: Secondary | ICD-10-CM | POA: Diagnosis not present

## 2022-02-18 DIAGNOSIS — Z7984 Long term (current) use of oral hypoglycemic drugs: Secondary | ICD-10-CM | POA: Diagnosis not present

## 2022-02-18 DIAGNOSIS — W19XXXD Unspecified fall, subsequent encounter: Secondary | ICD-10-CM | POA: Diagnosis not present

## 2022-02-18 DIAGNOSIS — E119 Type 2 diabetes mellitus without complications: Secondary | ICD-10-CM | POA: Diagnosis not present

## 2022-02-18 DIAGNOSIS — M1990 Unspecified osteoarthritis, unspecified site: Secondary | ICD-10-CM | POA: Diagnosis not present

## 2022-02-18 DIAGNOSIS — M7542 Impingement syndrome of left shoulder: Secondary | ICD-10-CM | POA: Diagnosis not present

## 2022-02-18 DIAGNOSIS — I2510 Atherosclerotic heart disease of native coronary artery without angina pectoris: Secondary | ICD-10-CM | POA: Diagnosis not present

## 2022-02-18 DIAGNOSIS — N3946 Mixed incontinence: Secondary | ICD-10-CM | POA: Diagnosis not present

## 2022-02-19 DIAGNOSIS — E1165 Type 2 diabetes mellitus with hyperglycemia: Secondary | ICD-10-CM | POA: Diagnosis not present

## 2022-02-20 ENCOUNTER — Other Ambulatory Visit: Payer: Self-pay | Admitting: Cardiology

## 2022-02-20 DIAGNOSIS — I5032 Chronic diastolic (congestive) heart failure: Secondary | ICD-10-CM

## 2022-02-22 DIAGNOSIS — Z20822 Contact with and (suspected) exposure to covid-19: Secondary | ICD-10-CM | POA: Diagnosis not present

## 2022-02-23 DIAGNOSIS — M7542 Impingement syndrome of left shoulder: Secondary | ICD-10-CM | POA: Diagnosis not present

## 2022-02-23 DIAGNOSIS — M1990 Unspecified osteoarthritis, unspecified site: Secondary | ICD-10-CM | POA: Diagnosis not present

## 2022-02-23 DIAGNOSIS — E119 Type 2 diabetes mellitus without complications: Secondary | ICD-10-CM | POA: Diagnosis not present

## 2022-02-23 DIAGNOSIS — M75102 Unspecified rotator cuff tear or rupture of left shoulder, not specified as traumatic: Secondary | ICD-10-CM | POA: Diagnosis not present

## 2022-02-23 DIAGNOSIS — R296 Repeated falls: Secondary | ICD-10-CM | POA: Diagnosis not present

## 2022-02-23 DIAGNOSIS — W19XXXD Unspecified fall, subsequent encounter: Secondary | ICD-10-CM | POA: Diagnosis not present

## 2022-03-01 DIAGNOSIS — Z20822 Contact with and (suspected) exposure to covid-19: Secondary | ICD-10-CM | POA: Diagnosis not present

## 2022-03-02 DIAGNOSIS — E119 Type 2 diabetes mellitus without complications: Secondary | ICD-10-CM | POA: Diagnosis not present

## 2022-03-02 DIAGNOSIS — M75102 Unspecified rotator cuff tear or rupture of left shoulder, not specified as traumatic: Secondary | ICD-10-CM | POA: Diagnosis not present

## 2022-03-02 DIAGNOSIS — M7542 Impingement syndrome of left shoulder: Secondary | ICD-10-CM | POA: Diagnosis not present

## 2022-03-02 DIAGNOSIS — M1990 Unspecified osteoarthritis, unspecified site: Secondary | ICD-10-CM | POA: Diagnosis not present

## 2022-03-02 DIAGNOSIS — W19XXXD Unspecified fall, subsequent encounter: Secondary | ICD-10-CM | POA: Diagnosis not present

## 2022-03-02 DIAGNOSIS — R296 Repeated falls: Secondary | ICD-10-CM | POA: Diagnosis not present

## 2022-03-09 DIAGNOSIS — E119 Type 2 diabetes mellitus without complications: Secondary | ICD-10-CM | POA: Diagnosis not present

## 2022-03-09 DIAGNOSIS — W19XXXD Unspecified fall, subsequent encounter: Secondary | ICD-10-CM | POA: Diagnosis not present

## 2022-03-09 DIAGNOSIS — M75102 Unspecified rotator cuff tear or rupture of left shoulder, not specified as traumatic: Secondary | ICD-10-CM | POA: Diagnosis not present

## 2022-03-09 DIAGNOSIS — M7542 Impingement syndrome of left shoulder: Secondary | ICD-10-CM | POA: Diagnosis not present

## 2022-03-09 DIAGNOSIS — R296 Repeated falls: Secondary | ICD-10-CM | POA: Diagnosis not present

## 2022-03-09 DIAGNOSIS — M1990 Unspecified osteoarthritis, unspecified site: Secondary | ICD-10-CM | POA: Diagnosis not present

## 2022-03-11 DIAGNOSIS — H401122 Primary open-angle glaucoma, left eye, moderate stage: Secondary | ICD-10-CM | POA: Diagnosis not present

## 2022-03-11 DIAGNOSIS — H401111 Primary open-angle glaucoma, right eye, mild stage: Secondary | ICD-10-CM | POA: Diagnosis not present

## 2022-03-17 DIAGNOSIS — E119 Type 2 diabetes mellitus without complications: Secondary | ICD-10-CM | POA: Diagnosis not present

## 2022-03-17 DIAGNOSIS — M7542 Impingement syndrome of left shoulder: Secondary | ICD-10-CM | POA: Diagnosis not present

## 2022-03-17 DIAGNOSIS — M75102 Unspecified rotator cuff tear or rupture of left shoulder, not specified as traumatic: Secondary | ICD-10-CM | POA: Diagnosis not present

## 2022-03-17 DIAGNOSIS — R296 Repeated falls: Secondary | ICD-10-CM | POA: Diagnosis not present

## 2022-03-17 DIAGNOSIS — W19XXXD Unspecified fall, subsequent encounter: Secondary | ICD-10-CM | POA: Diagnosis not present

## 2022-03-17 DIAGNOSIS — M1990 Unspecified osteoarthritis, unspecified site: Secondary | ICD-10-CM | POA: Diagnosis not present

## 2022-05-03 ENCOUNTER — Other Ambulatory Visit: Payer: Self-pay

## 2022-05-03 DIAGNOSIS — I5032 Chronic diastolic (congestive) heart failure: Secondary | ICD-10-CM

## 2022-05-03 DIAGNOSIS — I10 Essential (primary) hypertension: Secondary | ICD-10-CM

## 2022-05-03 MED ORDER — METOPROLOL TARTRATE 100 MG PO TABS: 100.0000 mg | ORAL_TABLET | Freq: Two times a day (BID) | ORAL | 0 refills | Status: DC

## 2022-05-05 NOTE — Progress Notes (Incomplete)
Cardiology Office Note:    Date:  05/05/2022   ID:  Jessica [name], DOB 03-Jan-1946, MRN [id number]  PCP:  Jessica Caraway, MD  [name] Cardiologist:  Jessica Furbish, MD  [name] Electrophysiologist:  None   Referring MD: Jessica Caraway, MD    History of Present Illness:    Jessica [name] is a 76 y.o. female here for the follow-up of hypertension and hyperlipidemia.  She presented to the ED on 05/30/2021 after developing intermittent epistaxis over the previous day. This resolved prior to her examination by pressure application per EMS. She had also been diagnosed with a COVID infection 4 days prior. She was treated for a UTI as well.  On 04/20/21 she was seen by Jessica Drown, NP for follow up and preoperative clearance prior to a hysterectomy per Dr. Landry [name] which was performed on 05/13/21. She reported occasional shortness of breath which she attributed to a sedentary lifestyle and obesity. She denied recurring palpitations, managed on metoprolol 100 mg BID. Her blood pressure was stable on losartan, metoprolol, and spironolactone. Her last LDL was noted to be 81; her simvastatin therapy was continued.  Cardiac catheterization in 2010: No evidence of flow-limiting coronary artery disease.  Has had repeated falls.  Has had outpatient rehabilitation for this.  Echocardiogram 05/05/21 demonstrated normal ejection fraction.  Increased left atrial pressures.  Uses CPAP for obstructive sleep apnea.  Today: ***  She denies any palpitations, chest [name], shortness of breath, or peripheral edema. No lightheadedness, headaches, syncope, orthopnea, or PND.  (+)  Past Medical History:  Diagnosis Date   Anemia    Arthritis    Cataract    Diabetes mellitus without complication (HCC)    Diastolic dysfunction    Frozen shoulder    Glaucoma    Heart murmur    History of kidney stones    Hyperlipidemia    Hypertension    Macular degeneration    Neuropathy    Obesity     Sleep apnea    Stroke [name])    has been told by a Jessica [name] that she may have had a mini stroke but no further testing was done to confirm   Tachycardia     Past Surgical History:  Procedure Laterality Date   CARDIAC CATHETERIZATION     -6/09-normal ejection fraction 65%, no angiographically significant CAD., M. Skains MD   CESAREAN SECTION     COLONOSCOPY     foot spurs removed     HYSTEROSCOPY WITH D & C N/A 06/28/2017   Procedure: DILATATION AND CURETTAGE /HYSTEROSCOPY/POLYPECTOMY WITH MYOSURE;  Surgeon: Jessica Louis, MD;  Location: [facility];  Service: Gynecology;  Laterality: N/A;   HYSTEROSCOPY WITH D & C N/A 05/13/2021   Procedure: DILATATION AND CURETTAGE /HYSTEROSCOPY AND POSSIBLE POLYPECTOMY;  Surgeon: Jessica Louis, MD;  Location: [facility];  Service: Gynecology;  Laterality: N/A;   JOINT REPLACEMENT     right knee replaced   knee spurs removed     ORIF FEMUR FRACTURE Right 04/19/2019   Procedure: OPEN REDUCTION INTERNAL FIXATION (ORIF) DISTAL FEMUR FRACTURE;  Surgeon: Jessica Can, MD;  Location: [facility];  Service: Orthopedics;  Laterality: Right;   shoulder spurs removed     TONSILLECTOMY AND ADENOIDECTOMY     UPPER GI ENDOSCOPY      Current Medications: No outpatient medications have been marked as taking for the 05/06/22 encounter (Appointment) with Jessica Pain, MD.     Allergies:   Meloxicam, Percocet [oxycodone-acetaminophen],  Tramadol, Vicodin [hydrocodone-acetaminophen], Azithromycin, Iodine, and Shellfish allergy   Social History   Socioeconomic History   Marital status: Single    Spouse name: Not on file   Number of children: 1   Years of education: 12   Highest education level: 12th grade  Occupational History   Occupation: Retired  Tobacco Use   Smoking status: Never   Smokeless tobacco: Never  Vaping Use   Vaping Use: Never used  Substance and Sexual Activity   Alcohol use: Yes    Comment: rarely   Drug use: No   Sexual  activity: Not Currently    Birth control/protection: Post-menopausal  Other Topics Concern   Not on file  Social History Narrative   Never smoker, no alcohol, vegetarian.   Social Determinants of Health   Financial Resource Strain: Low Risk  (09/26/2020)   Overall Financial Resource Strain (CARDIA)    Difficulty of Paying Living Expenses: Not hard at all  Food Insecurity: No Food Insecurity (09/26/2020)   Hunger Vital Sign    Worried About Running Out of Food in the Last Year: Never true    Ran Out of Food in the Last Year: Never true  Transportation Needs: No Transportation Needs (09/26/2020)   PRAPARE - Hydrologist (Medical): No    Lack of Transportation (Non-Medical): No  Physical Activity: Sufficiently Active (09/26/2020)   Exercise Vital Sign    Days of Exercise per Week: 3 days    Minutes of Exercise per Session: 50 min  Stress: No Stress Concern Present (09/26/2020)   [place]    Feeling of Stress : Not at all  Social Connections: Unknown (09/26/2020)   Social Connection and Isolation Panel [NHANES]    Frequency of Communication with Friends and Family: More than three times a week    Frequency of Social Gatherings with Friends and Family: More than three times a week    Attends Religious Services: More than 4 times per year    Active Member of [employer] or Organizations: Yes    Attends Archivist Meetings: Never    Marital Status: Not on file     Family History: The patient's family history includes Brain cancer in her mother; Colon polyps in her father.  ROS:   Please see the history of present illness.     All other systems reviewed and are negative.  EKGs/Labs/Other Studies Reviewed:    Echocardiogram  05/05/2021:  1. Left ventricular ejection fraction, by estimation, is 60 to 65%. The  left ventricle has normal function. The left ventricle has no regional  wall  motion abnormalities. Left ventricular diastolic parameters are  consistent with Grade I diastolic  dysfunction (impaired relaxation). Elevated left atrial pressure. The E/e'  is 74. The average left ventricular global longitudinal strain is -24.9 %.  The global longitudinal strain is normal.   2. Right ventricular systolic function is normal. The right ventricular  size is normal.   3. The mitral valve is normal in structure. No evidence of mitral valve  regurgitation. No evidence of mitral stenosis.   4. The aortic valve is normal in structure. Aortic valve regurgitation is  not visualized. No aortic stenosis is present.   5. The inferior vena cava is normal in size with greater than 50%  respiratory variability, suggesting right atrial pressure of 3 mmHg.    EKG:   EKG is personally reviewed. 05/06/2022:  Sinus ***. Rate ***  bpm. 08/13/2020:  sinus rhythm 98 with no other abnormalities.  Recent Labs: 05/11/2021: BUN 33; Creatinine, Ser 0.99; Potassium 5.1; Sodium 132 05/13/2021: Hemoglobin 12.8; Platelets 215   Recent Lipid Panel    Component Value Date/Time   CHOL 165 09/03/2014 1651   TRIG (H) 09/03/2014 1651    574.0 Triglyceride is over 400; calculations on Lipids are invalid.   HDL 32.70 (L) 09/03/2014 1651   CHOLHDL 5 09/03/2014 1651   VLDL 114.8 (H) 09/03/2014 1651   LDLDIRECT 67.3 09/03/2014 1651     Physical Exam:    VS:  LMP  (LMP Unknown)     Wt Readings from Last 3 Encounters:  05/13/21 230 lb 9.6 oz (104.6 kg)  04/20/21 233 lb (105.7 kg)  09/26/20 227 lb 6.4 oz (103.1 kg)     GEN: Obese, well nourished, well developed in no acute distress HEENT: Normal NECK: No JVD; No carotid bruits LYMPHATICS: No lymphadenopathy CARDIAC: RRR, no murmurs, rubs, gallops RESPIRATORY:  Clear to auscultation without rales, wheezing or rhonchi  ABDOMEN: Soft, non-tender, non-distended MUSCULOSKELETAL:  No edema; No deformity  SKIN: Warm and dry NEUROLOGIC:  Alert and  oriented x 3 PSYCHIATRIC:  Normal affect   ASSESSMENT:    No diagnosis found.  PLAN:    In order of problems listed above:  No problem-specific Assessment & Plan notes found for this encounter.   Essential hypertension -Fairly well controlled.  Continue with losartan, metoprolol, spironolactone.  No changes made.  Palpitations -She is taking metoprolol twice a day.  Now that she is taking this on a more scheduled basis 7 AM, 7 PM she feels better.  While she was in the nursing home she stated that she was given the metoprolol sometimes later or earlier than usual and she may feel some extra skips at that time.  Diabetes with hypertension -Hemoglobin A1c 7.5.  Close to goal.  Hyperlipidemia -LDL 81, simvastatin.  Excellent.  Frequent falls -Continuing with rehabilitation efforts.  Aortic atherosclerosis -Seen on CT scan 08/13/2020.  Continue with simvastatin.  Morbid obesity -Continue to encourage weight loss.  Weight is down.  Follow-up:  ***  Medication Adjustments/Labs and Tests Ordered: Current medicines are reviewed at length with the patient today.  Concerns regarding medicines are outlined above.   No orders of the defined types were placed in this encounter.  No orders of the defined types were placed in this encounter.  There are no Patient Instructions on file for this visit.   I,Mathew Stumpf,acting as a Education administrator for [employer], MD.,have documented all relevant documentation on the behalf of Jessica Furbish, MD,as directed by  Jessica Furbish, MD while in the presence of Jessica Furbish, MD.  ***  Signed, Madelin Rear  05/05/2022 4:04 PM    [facility]

## 2022-05-06 ENCOUNTER — Ambulatory Visit: Payer: Medicare Other | Admitting: Cardiology

## 2022-06-01 DIAGNOSIS — Z9641 Presence of insulin pump (external) (internal): Secondary | ICD-10-CM | POA: Diagnosis not present

## 2022-06-01 DIAGNOSIS — E669 Obesity, unspecified: Secondary | ICD-10-CM | POA: Diagnosis not present

## 2022-06-01 DIAGNOSIS — I10 Essential (primary) hypertension: Secondary | ICD-10-CM | POA: Diagnosis not present

## 2022-06-01 DIAGNOSIS — E7800 Pure hypercholesterolemia, unspecified: Secondary | ICD-10-CM | POA: Diagnosis not present

## 2022-06-01 DIAGNOSIS — G609 Hereditary and idiopathic neuropathy, unspecified: Secondary | ICD-10-CM | POA: Diagnosis not present

## 2022-06-01 DIAGNOSIS — E1165 Type 2 diabetes mellitus with hyperglycemia: Secondary | ICD-10-CM | POA: Diagnosis not present

## 2022-06-08 ENCOUNTER — Ambulatory Visit (INDEPENDENT_AMBULATORY_CARE_PROVIDER_SITE_OTHER): Payer: Medicare Other | Admitting: Cardiology

## 2022-06-08 ENCOUNTER — Encounter: Payer: Self-pay | Admitting: Cardiology

## 2022-06-08 VITALS — BP 120/60 | HR 98 | Ht 59.0 in | Wt 241.0 lb

## 2022-06-08 DIAGNOSIS — I5032 Chronic diastolic (congestive) heart failure: Secondary | ICD-10-CM

## 2022-06-08 DIAGNOSIS — I10 Essential (primary) hypertension: Secondary | ICD-10-CM | POA: Diagnosis not present

## 2022-06-08 MED ORDER — SPIRONOLACTONE 25 MG PO TABS: 12.5000 mg | ORAL_TABLET | Freq: Every day | ORAL | 3 refills | Status: DC

## 2022-06-08 NOTE — Progress Notes (Signed)
Cardiology Office Note:    Date:  06/08/2022   ID:  Jessica [name], Jessica [name] [birth year], MRN [id number]  PCP:  Cari Caraway, MD  [facility] Cardiologist:  Candee Furbish, MD  [facility] Electrophysiologist:  None   Referring MD: Cari Caraway, MD     History of Present Illness:    Jessica [name] is a 76 y.o. female here for the follow-up of hypertension hyperlipidemia.  Cardiac catheterization in 2010: No evidence of flow-limiting coronary artery disease.  Has had repeated falls.  Has had outpatient rehabilitation for this.  Has a metal rod in her right femur, right knee replacement as well.  Had to go to [place] home for several weeks, 4 months during the pandemic.  Echocardiogram demonstrated normal ejection fraction.  Increased left atrial pressures.  Uses CPAP for obstructive sleep apnea.  Palpitations - metoprol.  Doing well.  Feels them frequently but this is no change.    Past Medical History:  Diagnosis Date   Anemia    Arthritis    Cataract    Diabetes mellitus without complication (HCC)    Diastolic dysfunction    Frozen shoulder    Glaucoma    Heart murmur    History of kidney stones    Hyperlipidemia    Hypertension    Macular degeneration    Neuropathy    Obesity    Sleep apnea    Stroke [facility])    has been told by a Dr. Mort Sawyers that she may have had a mini stroke but no further testing was done to confirm   Tachycardia     Past Surgical History:  Procedure Laterality Date   CARDIAC CATHETERIZATION     -6/09-normal ejection fraction 65%, no angiographically significant CAD., M. Alejandro Gamel MD   CESAREAN SECTION     COLONOSCOPY     foot spurs removed     HYSTEROSCOPY WITH D & C N/A 06/28/2017   Procedure: DILATATION AND CURETTAGE /HYSTEROSCOPY/POLYPECTOMY WITH MYOSURE;  Surgeon: Christophe Louis, MD;  Location: [facility];  Service: Gynecology;  Laterality: N/A;   HYSTEROSCOPY WITH D & C N/A 05/13/2021   Procedure: DILATATION AND CURETTAGE  /HYSTEROSCOPY AND POSSIBLE POLYPECTOMY;  Surgeon: Christophe Louis, MD;  Location: [facility];  Service: Gynecology;  Laterality: N/A;   JOINT REPLACEMENT     right knee replaced   knee spurs removed     ORIF FEMUR FRACTURE Right 04/19/2019   Procedure: OPEN REDUCTION INTERNAL FIXATION (ORIF) DISTAL FEMUR FRACTURE;  Surgeon: Rod Can, MD;  Location: [facility];  Service: Orthopedics;  Laterality: Right;   shoulder spurs removed     TONSILLECTOMY AND ADENOIDECTOMY     UPPER GI ENDOSCOPY      Current Medications: Current Meds  Medication Sig   Cholecalciferol (VITAMIN D3) 50 MCG (2000 UT) capsule Take 2,000 Units by mouth daily.   Coenzyme Q10 (CO Q-10) 100 MG CAPS Take 100 mg by mouth at bedtime.    dorzolamidel-timolol (COSOPT) 22.3-6.8 MG/ML SOLN ophthalmic solution Place 1 drop into both eyes 2 (two) times daily.   furosemide (LASIX) 20 MG tablet TAKE 1 TABLET DAILY   ibuprofen (ADVIL) 600 MG tablet Take 1 tablet (600 mg total) by mouth every 6 (six) hours as needed.   insulin lispro (HUMALOG) 100 UNIT/ML injection Insulin pump (Patient taking differently: Inject 3 Units into the skin continuous. Insulin pump. Maximum daily dose - 200 units)   Iron-Vitamin C 65-125 MG TABS Take 1 tablet by mouth  at bedtime.    melatonin 5 MG TABS Take 5 mg by mouth at bedtime.   metFORMIN (GLUCOPHAGE-XR) 500 MG 24 hr tablet Take 1 tablet (500 mg total) by mouth at bedtime.   metoprolol tartrate (LOPRESSOR) 100 MG tablet Take 1 tablet (100 mg total) by mouth 2 (two) times daily. Please keep upcoming appt with Dr. Marlou Porch for July 2023 before anymore refills. Thank you Final attempt   Multiple Vitamin (MULTIVITAMIN WITH MINERALS) TABS tablet Take 1 tablet by mouth daily.   Multiple Vitamins-Minerals (PRESERVISION AREDS 2 PO) Take 1 tablet by mouth 2 (two) times daily.   olmesartan (BENICAR) 40 MG tablet Take 40 mg by mouth daily.   oxybutynin (DITROPAN-XL) 5 MG 24 hr tablet Take 5 mg by mouth at  bedtime.   simvastatin (ZOCOR) 40 MG tablet Take 1 tablet (40 mg total) by mouth every evening.   spironolactone (ALDACTONE) 25 MG tablet Take 0.5 tablets (12.5 mg total) by mouth daily.   Travoprost, BAK Free, (TRAVATAN) 0.004 % SOLN ophthalmic solution Place 1 drop into both eyes at bedtime.    [DISCONTINUED] spironolactone (ALDACTONE) 25 MG tablet TAKE 1 TABLET DAILY     Allergies:   Meloxicam, Percocet [oxycodone-acetaminophen], Tramadol, Vicodin [hydrocodone-acetaminophen], Azithromycin, Iodine, and Shellfish allergy   Social History   Socioeconomic History   Marital status: Single    Spouse name: Not on file   Number of children: 1   Years of education: 12   Highest education level: 12th grade  Occupational History   Occupation: Retired  Tobacco Use   Smoking status: Never   Smokeless tobacco: Never  Vaping Use   Vaping Use: Never used  Substance and Sexual Activity   Alcohol use: Yes    Comment: rarely   Drug use: No   Sexual activity: Not Currently    Birth control/protection: Post-menopausal  Other Topics Concern   Not on file  Social History Narrative   Never smoker, no alcohol, vegetarian.   Social Determinants of Health   Financial Resource Strain: Low Risk  (09/26/2020)   Overall Financial Resource Strain (CARDIA)    Difficulty of Paying Living Expenses: Not hard at all  Food Insecurity: No Food Insecurity (09/26/2020)   Hunger Vital Sign    Worried About Running Out of Food in the Last Year: Never true    Ran Out of Food in the Last Year: Never true  Transportation Needs: No Transportation Needs (09/26/2020)   PRAPARE - Hydrologist (Medical): No    Lack of Transportation (Non-Medical): No  Physical Activity: Sufficiently Active (09/26/2020)   Exercise Vital Sign    Days of Exercise per Week: 3 days    Minutes of Exercise per Session: 50 min  Stress: No Stress Concern Present (09/26/2020)   [place]    Feeling of Stress : Not at all  Social Connections: Unknown (09/26/2020)   Social Connection and Isolation Panel [NHANES]    Frequency of Communication with Friends and Family: More than three times a week    Frequency of Social Gatherings with Friends and Family: More than three times a week    Attends Religious Services: More than 4 times per year    Active Member of [employer] or Organizations: Yes    Attends Archivist Meetings: Never    Marital Status: Not on file     Family History: The patient's family history includes Brain cancer in her  mother; Colon polyps in her father.  ROS:   Please see the history of present illness.     All other systems reviewed and are negative.  EKGs/Labs/Other Studies Reviewed:     EKG: EKG on 06/08/2022-sinus rhythm 98 no other abnormalities-08/13/2020 shows sinus rhythm 98 with no other abnormalities.  Recent Labs: No results found for requested labs within last 365 days.  Recent Lipid Panel    Component Value Date/Time   CHOL 165 09/03/2014 1651   TRIG (H) 09/03/2014 1651    574.0 Triglyceride is over 400; calculations on Lipids are invalid.   HDL 32.70 (L) 09/03/2014 1651   CHOLHDL 5 09/03/2014 1651   VLDL 114.8 (H) 09/03/2014 1651   LDLDIRECT 67.3 09/03/2014 1651     Physical Exam:    VS:  BP 120/60 (BP Location: Left Arm, Patient Position: Sitting, Cuff Size: Large)   Pulse 98   Ht '4\' 11"'$  (1.499 m)   Wt 241 lb (109.3 kg)   LMP  (LMP Unknown)   BMI 48.68 kg/m     Wt Readings from Last 3 Encounters:  06/08/22 241 lb (109.3 kg)  05/13/21 230 lb 9.6 oz (104.6 kg)  04/20/21 233 lb (105.7 kg)     GEN: Well nourished, well developed, in no acute distress HEENT: normal Neck: no JVD, carotid bruits, or masses Cardiac: RRR; no murmurs, rubs, or gallops,no edema  Respiratory:  clear to auscultation bilaterally, normal work of breathing GI: soft, nontender, nondistended, +  BS MS: no deformity or atrophy Skin: warm and dry, no rash Neuro:  Alert and Oriented x 3, Strength and sensation are intact Psych: euthymic mood, full affect   ASSESSMENT:    1. Essential hypertension   2. Chronic diastolic congestive heart failure (HCC)     PLAN:    In order of problems listed above:  Essential hypertension - Very well controlled.  Has been low at times.  We will pull back on her spironolactone to 12.5 mg once a day from 25.  Continue with olmesartan Toprol.  Palpitations -She is taking metoprolol twice a day.  Takes it regularly.  She uses her Alexa at home to help with this.  Diabetes with hypertension -Hemoglobin A1c excellent at 6.9.    Hyperlipidemia -LDL 34, simvastatin 40.  Excellent.  No changes made.  Frequent falls -Continuing with rehabilitation efforts.  Aortic atherosclerosis -Seen on CT scan 08/13/2020.  Continue with simvastatin.  Morbid obesity -Continue to encourage weight loss.  Weight is down.  Medication Adjustments/Labs and Tests Ordered: Current medicines are reviewed at length with the patient today.  Concerns regarding medicines are outlined above.  Orders Placed This Encounter  Procedures   EKG 12-Lead   Meds ordered this encounter  Medications   spironolactone (ALDACTONE) 25 MG tablet    Sig: Take 0.5 tablets (12.5 mg total) by mouth daily.    Dispense:  45 tablet    Refill:  3    Patient Instructions  Medication Instructions:  Please decrease Spironolactone to 25 mg - 1/2 tablet daily Continue all other medications as listed.  *If you need a refill on your cardiac medications before your next appointment, please call your pharmacy*  Follow-Up: At [facility], you and your health needs are our priority.  As part of our continuing mission to provide you with exceptional heart care, we have created designated Provider Care Teams.  These Care Teams include your primary Cardiologist (physician) and Advanced  Practice Providers (APPs -  Physician Assistants  and Nurse Practitioners) who all work together to provide you with the care you need, when you need it.  We recommend signing up for the patient portal called "MyChart".  Sign up information is provided on this After Visit Summary.  MyChart is used to connect with patients for Virtual Visits (Telemedicine).  Patients are able to view lab/test results, encounter notes, upcoming appointments, etc.  Non-urgent messages can be sent to your provider as well.   To learn more about what you can do with MyChart, go to [email or web address].    Your next appointment:   1 year(s)  The format for your next appointment:   In Person  Provider:   Candee Furbish, MD {   Important Information About Sugar         Signed, Candee Furbish, MD  06/08/2022 5:01 PM    [place]

## 2022-06-08 NOTE — Patient Instructions (Signed)
Medication Instructions:  Please decrease Spironolactone to 25 mg - 1/2 tablet daily Continue all other medications as listed.  *If you need a refill on your cardiac medications before your next appointment, please call your pharmacy*  Follow-Up: At [facility], you and your health needs are our priority.  As part of our continuing mission to provide you with exceptional heart care, we have created designated Provider Care Teams.  These Care Teams include your primary Cardiologist (physician) and Advanced Practice Providers (APPs -  Physician Assistants and Nurse Practitioners) who all work together to provide you with the care you need, when you need it.  We recommend signing up for the patient portal called "MyChart".  Sign up information is provided on this After Visit Summary.  MyChart is used to connect with patients for Virtual Visits (Telemedicine).  Patients are able to view lab/test results, encounter notes, upcoming appointments, etc.  Non-urgent messages can be sent to your provider as well.   To learn more about what you can do with MyChart, go to [email or web address].    Your next appointment:   1 year(s)  The format for your next appointment:   In Person  Provider:   Candee Furbish, MD {   Important Information About Sugar

## 2022-06-15 DIAGNOSIS — Z1231 Encounter for screening mammogram for malignant neoplasm of breast: Secondary | ICD-10-CM | POA: Diagnosis not present

## 2022-07-01 DIAGNOSIS — R928 Other abnormal and inconclusive findings on diagnostic imaging of breast: Secondary | ICD-10-CM | POA: Diagnosis not present

## 2022-07-01 DIAGNOSIS — R922 Inconclusive mammogram: Secondary | ICD-10-CM | POA: Diagnosis not present

## 2022-07-27 ENCOUNTER — Other Ambulatory Visit: Payer: Self-pay | Admitting: *Deleted

## 2022-07-27 DIAGNOSIS — I5032 Chronic diastolic (congestive) heart failure: Secondary | ICD-10-CM

## 2022-07-27 DIAGNOSIS — I10 Essential (primary) hypertension: Secondary | ICD-10-CM

## 2022-07-27 MED ORDER — METOPROLOL TARTRATE 100 MG PO TABS: 100.0000 mg | ORAL_TABLET | Freq: Two times a day (BID) | ORAL | 3 refills | Status: DC

## 2022-08-10 ENCOUNTER — Other Ambulatory Visit: Payer: Self-pay | Admitting: Cardiology

## 2022-08-10 DIAGNOSIS — I5032 Chronic diastolic (congestive) heart failure: Secondary | ICD-10-CM

## 2022-08-18 DIAGNOSIS — N3946 Mixed incontinence: Secondary | ICD-10-CM | POA: Diagnosis not present

## 2022-08-18 DIAGNOSIS — E782 Mixed hyperlipidemia: Secondary | ICD-10-CM | POA: Diagnosis not present

## 2022-08-18 DIAGNOSIS — B372 Candidiasis of skin and nail: Secondary | ICD-10-CM | POA: Diagnosis not present

## 2022-08-18 DIAGNOSIS — G4733 Obstructive sleep apnea (adult) (pediatric): Secondary | ICD-10-CM | POA: Diagnosis not present

## 2022-08-18 DIAGNOSIS — R296 Repeated falls: Secondary | ICD-10-CM | POA: Diagnosis not present

## 2022-08-18 DIAGNOSIS — I700 Atherosclerosis of aorta: Secondary | ICD-10-CM | POA: Diagnosis not present

## 2022-08-18 DIAGNOSIS — E2839 Other primary ovarian failure: Secondary | ICD-10-CM | POA: Diagnosis not present

## 2022-08-18 DIAGNOSIS — E1140 Type 2 diabetes mellitus with diabetic neuropathy, unspecified: Secondary | ICD-10-CM | POA: Diagnosis not present

## 2022-08-18 DIAGNOSIS — G479 Sleep disorder, unspecified: Secondary | ICD-10-CM | POA: Diagnosis not present

## 2022-08-18 DIAGNOSIS — H4089 Other specified glaucoma: Secondary | ICD-10-CM | POA: Diagnosis not present

## 2022-08-18 DIAGNOSIS — Z23 Encounter for immunization: Secondary | ICD-10-CM | POA: Diagnosis not present

## 2022-08-18 DIAGNOSIS — I10 Essential (primary) hypertension: Secondary | ICD-10-CM | POA: Diagnosis not present

## 2022-08-30 DIAGNOSIS — Z1389 Encounter for screening for other disorder: Secondary | ICD-10-CM | POA: Diagnosis not present

## 2022-08-30 DIAGNOSIS — Z0000 Encounter for general adult medical examination without abnormal findings: Secondary | ICD-10-CM | POA: Diagnosis not present

## 2022-11-19 DIAGNOSIS — G609 Hereditary and idiopathic neuropathy, unspecified: Secondary | ICD-10-CM | POA: Diagnosis not present

## 2022-11-19 DIAGNOSIS — E7800 Pure hypercholesterolemia, unspecified: Secondary | ICD-10-CM | POA: Diagnosis not present

## 2022-11-19 DIAGNOSIS — Z23 Encounter for immunization: Secondary | ICD-10-CM | POA: Diagnosis not present

## 2022-11-19 DIAGNOSIS — Z9641 Presence of insulin pump (external) (internal): Secondary | ICD-10-CM | POA: Diagnosis not present

## 2022-11-19 DIAGNOSIS — I10 Essential (primary) hypertension: Secondary | ICD-10-CM | POA: Diagnosis not present

## 2022-11-19 DIAGNOSIS — E1165 Type 2 diabetes mellitus with hyperglycemia: Secondary | ICD-10-CM | POA: Diagnosis not present

## 2022-11-19 DIAGNOSIS — E669 Obesity, unspecified: Secondary | ICD-10-CM | POA: Diagnosis not present

## 2023-01-14 DIAGNOSIS — I10 Essential (primary) hypertension: Secondary | ICD-10-CM | POA: Diagnosis not present

## 2023-01-14 DIAGNOSIS — E1140 Type 2 diabetes mellitus with diabetic neuropathy, unspecified: Secondary | ICD-10-CM | POA: Diagnosis not present

## 2023-01-14 DIAGNOSIS — E113291 Type 2 diabetes mellitus with mild nonproliferative diabetic retinopathy without macular edema, right eye: Secondary | ICD-10-CM | POA: Diagnosis not present

## 2023-01-14 DIAGNOSIS — E782 Mixed hyperlipidemia: Secondary | ICD-10-CM | POA: Diagnosis not present

## 2023-02-10 DIAGNOSIS — E782 Mixed hyperlipidemia: Secondary | ICD-10-CM | POA: Diagnosis not present

## 2023-02-10 DIAGNOSIS — I10 Essential (primary) hypertension: Secondary | ICD-10-CM | POA: Diagnosis not present

## 2023-02-17 DIAGNOSIS — N3946 Mixed incontinence: Secondary | ICD-10-CM | POA: Diagnosis not present

## 2023-02-17 DIAGNOSIS — E1165 Type 2 diabetes mellitus with hyperglycemia: Secondary | ICD-10-CM | POA: Diagnosis not present

## 2023-02-17 DIAGNOSIS — Z794 Long term (current) use of insulin: Secondary | ICD-10-CM | POA: Diagnosis not present

## 2023-02-17 DIAGNOSIS — E782 Mixed hyperlipidemia: Secondary | ICD-10-CM | POA: Diagnosis not present

## 2023-02-17 DIAGNOSIS — G629 Polyneuropathy, unspecified: Secondary | ICD-10-CM | POA: Diagnosis not present

## 2023-02-17 DIAGNOSIS — I700 Atherosclerosis of aorta: Secondary | ICD-10-CM | POA: Diagnosis not present

## 2023-02-17 DIAGNOSIS — E113291 Type 2 diabetes mellitus with mild nonproliferative diabetic retinopathy without macular edema, right eye: Secondary | ICD-10-CM | POA: Diagnosis not present

## 2023-02-17 DIAGNOSIS — I10 Essential (primary) hypertension: Secondary | ICD-10-CM | POA: Diagnosis not present

## 2023-02-17 DIAGNOSIS — R296 Repeated falls: Secondary | ICD-10-CM | POA: Diagnosis not present

## 2023-02-17 DIAGNOSIS — G4733 Obstructive sleep apnea (adult) (pediatric): Secondary | ICD-10-CM | POA: Diagnosis not present

## 2023-02-17 DIAGNOSIS — E1140 Type 2 diabetes mellitus with diabetic neuropathy, unspecified: Secondary | ICD-10-CM | POA: Diagnosis not present

## 2023-02-17 DIAGNOSIS — H4089 Other specified glaucoma: Secondary | ICD-10-CM | POA: Diagnosis not present

## 2023-02-18 ENCOUNTER — Other Ambulatory Visit: Payer: Self-pay

## 2023-02-18 DIAGNOSIS — G609 Hereditary and idiopathic neuropathy, unspecified: Secondary | ICD-10-CM | POA: Diagnosis not present

## 2023-02-18 DIAGNOSIS — E1165 Type 2 diabetes mellitus with hyperglycemia: Secondary | ICD-10-CM | POA: Diagnosis not present

## 2023-02-18 DIAGNOSIS — I5032 Chronic diastolic (congestive) heart failure: Secondary | ICD-10-CM

## 2023-02-18 DIAGNOSIS — I10 Essential (primary) hypertension: Secondary | ICD-10-CM | POA: Diagnosis not present

## 2023-02-18 DIAGNOSIS — E669 Obesity, unspecified: Secondary | ICD-10-CM | POA: Diagnosis not present

## 2023-02-18 DIAGNOSIS — E7800 Pure hypercholesterolemia, unspecified: Secondary | ICD-10-CM | POA: Diagnosis not present

## 2023-02-18 DIAGNOSIS — Z9641 Presence of insulin pump (external) (internal): Secondary | ICD-10-CM | POA: Diagnosis not present

## 2023-02-18 MED ORDER — FUROSEMIDE 20 MG PO TABS: 20.0000 mg | ORAL_TABLET | Freq: Every day | ORAL | 1 refills | Status: DC

## 2023-04-07 DIAGNOSIS — H353131 Nonexudative age-related macular degeneration, bilateral, early dry stage: Secondary | ICD-10-CM | POA: Diagnosis not present

## 2023-04-07 DIAGNOSIS — H401111 Primary open-angle glaucoma, right eye, mild stage: Secondary | ICD-10-CM | POA: Diagnosis not present

## 2023-04-07 DIAGNOSIS — E113293 Type 2 diabetes mellitus with mild nonproliferative diabetic retinopathy without macular edema, bilateral: Secondary | ICD-10-CM | POA: Diagnosis not present

## 2023-04-07 DIAGNOSIS — H401122 Primary open-angle glaucoma, left eye, moderate stage: Secondary | ICD-10-CM | POA: Diagnosis not present

## 2023-04-11 DIAGNOSIS — E1140 Type 2 diabetes mellitus with diabetic neuropathy, unspecified: Secondary | ICD-10-CM | POA: Diagnosis not present

## 2023-04-11 DIAGNOSIS — E782 Mixed hyperlipidemia: Secondary | ICD-10-CM | POA: Diagnosis not present

## 2023-04-11 DIAGNOSIS — Z6841 Body Mass Index (BMI) 40.0 and over, adult: Secondary | ICD-10-CM | POA: Diagnosis not present

## 2023-05-04 DIAGNOSIS — H401111 Primary open-angle glaucoma, right eye, mild stage: Secondary | ICD-10-CM | POA: Diagnosis not present

## 2023-05-12 DIAGNOSIS — E1165 Type 2 diabetes mellitus with hyperglycemia: Secondary | ICD-10-CM | POA: Diagnosis not present

## 2023-05-20 DIAGNOSIS — G609 Hereditary and idiopathic neuropathy, unspecified: Secondary | ICD-10-CM | POA: Diagnosis not present

## 2023-05-20 DIAGNOSIS — E1165 Type 2 diabetes mellitus with hyperglycemia: Secondary | ICD-10-CM | POA: Diagnosis not present

## 2023-05-20 DIAGNOSIS — Z9641 Presence of insulin pump (external) (internal): Secondary | ICD-10-CM | POA: Diagnosis not present

## 2023-05-20 DIAGNOSIS — E7800 Pure hypercholesterolemia, unspecified: Secondary | ICD-10-CM | POA: Diagnosis not present

## 2023-05-20 DIAGNOSIS — I10 Essential (primary) hypertension: Secondary | ICD-10-CM | POA: Diagnosis not present

## 2023-05-20 DIAGNOSIS — E669 Obesity, unspecified: Secondary | ICD-10-CM | POA: Diagnosis not present

## 2023-05-31 DIAGNOSIS — H401122 Primary open-angle glaucoma, left eye, moderate stage: Secondary | ICD-10-CM | POA: Diagnosis not present

## 2023-06-20 ENCOUNTER — Other Ambulatory Visit: Payer: Self-pay | Admitting: Cardiology

## 2023-06-20 DIAGNOSIS — I5032 Chronic diastolic (congestive) heart failure: Secondary | ICD-10-CM

## 2023-06-20 DIAGNOSIS — I10 Essential (primary) hypertension: Secondary | ICD-10-CM

## 2023-06-21 DIAGNOSIS — Z1231 Encounter for screening mammogram for malignant neoplasm of breast: Secondary | ICD-10-CM | POA: Diagnosis not present

## 2023-06-21 DIAGNOSIS — N958 Other specified menopausal and perimenopausal disorders: Secondary | ICD-10-CM | POA: Diagnosis not present

## 2023-06-21 DIAGNOSIS — E119 Type 2 diabetes mellitus without complications: Secondary | ICD-10-CM | POA: Diagnosis not present

## 2023-06-21 DIAGNOSIS — M8588 Other specified disorders of bone density and structure, other site: Secondary | ICD-10-CM | POA: Diagnosis not present

## 2023-06-22 DIAGNOSIS — E782 Mixed hyperlipidemia: Secondary | ICD-10-CM | POA: Diagnosis not present

## 2023-06-22 DIAGNOSIS — E1140 Type 2 diabetes mellitus with diabetic neuropathy, unspecified: Secondary | ICD-10-CM | POA: Diagnosis not present

## 2023-06-22 DIAGNOSIS — Z6841 Body Mass Index (BMI) 40.0 and over, adult: Secondary | ICD-10-CM | POA: Diagnosis not present

## 2023-06-22 DIAGNOSIS — I10 Essential (primary) hypertension: Secondary | ICD-10-CM | POA: Diagnosis not present

## 2023-08-17 DIAGNOSIS — G4733 Obstructive sleep apnea (adult) (pediatric): Secondary | ICD-10-CM | POA: Diagnosis not present

## 2023-08-17 DIAGNOSIS — E1140 Type 2 diabetes mellitus with diabetic neuropathy, unspecified: Secondary | ICD-10-CM | POA: Diagnosis not present

## 2023-08-17 DIAGNOSIS — E782 Mixed hyperlipidemia: Secondary | ICD-10-CM | POA: Diagnosis not present

## 2023-08-17 DIAGNOSIS — Z6841 Body Mass Index (BMI) 40.0 and over, adult: Secondary | ICD-10-CM | POA: Diagnosis not present

## 2023-08-17 DIAGNOSIS — I10 Essential (primary) hypertension: Secondary | ICD-10-CM | POA: Diagnosis not present

## 2023-08-22 DIAGNOSIS — G609 Hereditary and idiopathic neuropathy, unspecified: Secondary | ICD-10-CM | POA: Diagnosis not present

## 2023-08-22 DIAGNOSIS — Z9641 Presence of insulin pump (external) (internal): Secondary | ICD-10-CM | POA: Diagnosis not present

## 2023-08-22 DIAGNOSIS — E669 Obesity, unspecified: Secondary | ICD-10-CM | POA: Diagnosis not present

## 2023-08-22 DIAGNOSIS — E7800 Pure hypercholesterolemia, unspecified: Secondary | ICD-10-CM | POA: Diagnosis not present

## 2023-08-22 DIAGNOSIS — Z23 Encounter for immunization: Secondary | ICD-10-CM | POA: Diagnosis not present

## 2023-08-22 DIAGNOSIS — I10 Essential (primary) hypertension: Secondary | ICD-10-CM | POA: Diagnosis not present

## 2023-08-22 DIAGNOSIS — E1165 Type 2 diabetes mellitus with hyperglycemia: Secondary | ICD-10-CM | POA: Diagnosis not present

## 2023-08-29 ENCOUNTER — Telehealth: Payer: Self-pay | Admitting: Cardiology

## 2023-08-29 DIAGNOSIS — I10 Essential (primary) hypertension: Secondary | ICD-10-CM

## 2023-08-29 DIAGNOSIS — I5032 Chronic diastolic (congestive) heart failure: Secondary | ICD-10-CM

## 2023-08-29 MED ORDER — FUROSEMIDE 20 MG PO TABS
20.0000 mg | ORAL_TABLET | Freq: Every day | ORAL | 0 refills | Status: DC
Start: 2023-08-29 — End: 2023-11-28

## 2023-08-29 MED ORDER — METOPROLOL TARTRATE 100 MG PO TABS: 100.0000 mg | ORAL_TABLET | Freq: Two times a day (BID) | ORAL | 0 refills | Status: DC

## 2023-08-29 NOTE — Telephone Encounter (Signed)
*  STAT* If patient is at the pharmacy, call can be transferred to refill team.   1. Which medications need to be refilled? (please list name of each medication and dose if known)    metoprolol tartrate (LOPRESSOR) 100 MG tablet   furosemide (LASIX) 20 MG tablet       4. Which pharmacy/location (including street and city if local pharmacy) is medication to be sent to? [facility] [place] - [address] AT [address] TO [address]     5. Do they need a 30 day or 90 day supply? 90

## 2023-09-12 DIAGNOSIS — Z1331 Encounter for screening for depression: Secondary | ICD-10-CM | POA: Diagnosis not present

## 2023-09-12 DIAGNOSIS — Z0000 Encounter for general adult medical examination without abnormal findings: Secondary | ICD-10-CM | POA: Diagnosis not present

## 2023-09-12 DIAGNOSIS — Z6841 Body Mass Index (BMI) 40.0 and over, adult: Secondary | ICD-10-CM | POA: Diagnosis not present

## 2023-10-16 DIAGNOSIS — R55 Syncope and collapse: Secondary | ICD-10-CM | POA: Diagnosis not present

## 2023-10-16 DIAGNOSIS — W19XXXA Unspecified fall, initial encounter: Secondary | ICD-10-CM | POA: Diagnosis not present

## 2023-10-16 DIAGNOSIS — I10 Essential (primary) hypertension: Secondary | ICD-10-CM | POA: Diagnosis not present

## 2023-10-24 DIAGNOSIS — E1140 Type 2 diabetes mellitus with diabetic neuropathy, unspecified: Secondary | ICD-10-CM | POA: Diagnosis not present

## 2023-10-24 DIAGNOSIS — R55 Syncope and collapse: Secondary | ICD-10-CM | POA: Diagnosis not present

## 2023-10-24 DIAGNOSIS — B372 Candidiasis of skin and nail: Secondary | ICD-10-CM | POA: Diagnosis not present

## 2023-10-24 DIAGNOSIS — I10 Essential (primary) hypertension: Secondary | ICD-10-CM | POA: Diagnosis not present

## 2023-10-24 DIAGNOSIS — H4089 Other specified glaucoma: Secondary | ICD-10-CM | POA: Diagnosis not present

## 2023-10-24 DIAGNOSIS — M19022 Primary osteoarthritis, left elbow: Secondary | ICD-10-CM | POA: Diagnosis not present

## 2023-10-24 DIAGNOSIS — E782 Mixed hyperlipidemia: Secondary | ICD-10-CM | POA: Diagnosis not present

## 2023-10-24 DIAGNOSIS — G4733 Obstructive sleep apnea (adult) (pediatric): Secondary | ICD-10-CM | POA: Diagnosis not present

## 2023-10-24 DIAGNOSIS — D649 Anemia, unspecified: Secondary | ICD-10-CM | POA: Diagnosis not present

## 2023-10-24 DIAGNOSIS — N3946 Mixed incontinence: Secondary | ICD-10-CM | POA: Diagnosis not present

## 2023-10-24 DIAGNOSIS — N3000 Acute cystitis without hematuria: Secondary | ICD-10-CM | POA: Diagnosis not present

## 2023-11-22 DIAGNOSIS — I10 Essential (primary) hypertension: Secondary | ICD-10-CM | POA: Diagnosis not present

## 2023-11-22 DIAGNOSIS — G609 Hereditary and idiopathic neuropathy, unspecified: Secondary | ICD-10-CM | POA: Diagnosis not present

## 2023-11-22 DIAGNOSIS — E669 Obesity, unspecified: Secondary | ICD-10-CM | POA: Diagnosis not present

## 2023-11-22 DIAGNOSIS — Z9641 Presence of insulin pump (external) (internal): Secondary | ICD-10-CM | POA: Diagnosis not present

## 2023-11-22 DIAGNOSIS — E7800 Pure hypercholesterolemia, unspecified: Secondary | ICD-10-CM | POA: Diagnosis not present

## 2023-11-22 DIAGNOSIS — E1165 Type 2 diabetes mellitus with hyperglycemia: Secondary | ICD-10-CM | POA: Diagnosis not present

## 2023-11-24 ENCOUNTER — Encounter: Payer: Self-pay | Admitting: Cardiology

## 2023-11-24 ENCOUNTER — Ambulatory Visit: Payer: Medicare Other | Attending: Cardiology | Admitting: Cardiology

## 2023-11-24 ENCOUNTER — Ambulatory Visit (INDEPENDENT_AMBULATORY_CARE_PROVIDER_SITE_OTHER): Payer: Medicare Other

## 2023-11-24 VITALS — HR 76 | Ht 59.0 in | Wt 249.1 lb

## 2023-11-24 DIAGNOSIS — R55 Syncope and collapse: Secondary | ICD-10-CM

## 2023-11-24 DIAGNOSIS — I5032 Chronic diastolic (congestive) heart failure: Secondary | ICD-10-CM | POA: Diagnosis not present

## 2023-11-24 DIAGNOSIS — I10 Essential (primary) hypertension: Secondary | ICD-10-CM | POA: Diagnosis not present

## 2023-11-24 NOTE — Progress Notes (Signed)
Cardiology Office Note:  .   Date:  11/24/2023  ID:  Jessica, [name] 1946/04/21, MRN [id number] PCP: Gweneth Dimitri, MD  [facility] Providers Cardiologist:  Donato Schultz, MD     History of Present Illness: .   Jessica [name] is a 78 y.o. female Discussed with the use of AI scribe   History of Present Illness   The patient is a 78 year old female with hypertension and hyperlipidemia who presents with syncope and dizziness.  She experiences episodes of syncope and dizziness, describing them as feeling like being 'anesthetized'. She has passed out multiple times, requiring emergency services twice. During these episodes, she loses consciousness and wakes up on the floor, unsure if she hits her head.  She has had frequent falls in the past.  Is been challenging to parse whether or not these are mechanical falls or related to loss of consciousness prior to fall.  Her medical history includes hypertension and hyperlipidemia, managed with olmesartan and Toprol. Spironolactone was reduced to 12.5 mg daily due to low blood pressures. She takes metoprolol for frequent palpitations. She also has obstructive sleep apnea and uses a CPAP machine. Her diabetes is well-managed, with a recent hemoglobin A1c of 7.7%.  A CT scan noted aortic atherosclerosis, and a prior echocardiogram showed normal ejection fraction with increased left atrial pressures. A cardiac catheterization in 2010 showed no flow-limiting coronary artery disease. An EKG from August 2023 showed normal sinus rhythm at 98 beats per minute with no abnormalities.  She experiences muscle spasms near her heart, described as severe cramps, and worries they might be heart attacks. She reports leg swelling and uses compression hose, although she finds the recommended 30 mmHg difficult to manage.  Her significant orthopedic history includes a right femur with a metallic rod and a knee replacement. She was in a nursing home for four  months during the pandemic.  She expresses concern about a family history of brain tumors, as her mother died from one. She was on Diprivan for about five years to manage uterine bleeding, which she associates with a potential risk for brain tumors, but she stopped this treatment around age 47.          ROS: No bleeding  Studies Reviewed: Marland Kitchen   EKG Interpretation Date/Time:  Thursday November 24 2023 10:51:45 EST Ventricular Rate:  72 PR Interval:  136 QRS Duration:  86 QT Interval:  374 QTC Calculation: 409 R Axis:   -13  Text Interpretation: Normal sinus rhythm Normal ECG When compared with ECG of 13-Aug-2020 12:24, No significant change since last tracing Confirmed by Donato Schultz ([id number]) on 11/24/2023 10:57:31 AM    Results   LABS HbA1c: 7.7% LDL 33 triglycerides 340 ALT 20 creatinine 0.7  RADIOLOGY CT scan: Aortic atherosclerosis  DIAGNOSTIC Cardiac catheterization: No evidence of flow-limiting coronary artery disease (2010) Echocardiogram: Normal ejection fraction with increased left atrial pressures EKG: Normal sinus rhythm, 98 beats per minute, no other abnormalities (06/08/2022)     Risk Assessment/Calculations:         Physical Exam:   VS:  Pulse 76   Ht 4\' 11"  (1.499 m)   Wt 249 lb 1.6 oz (113 kg)   LMP  (LMP Unknown)   SpO2 97%   BMI 50.31 kg/m    Wt Readings from Last 3 Encounters:  11/24/23 249 lb 1.6 oz (113 kg)  06/08/22 241 lb (109.3 kg)  05/13/21 230 lb 9.6 oz (104.6 kg)  GEN: Well nourished, well developed in no acute distress NECK: No JVD; No carotid bruits CARDIAC: RRR, no murmurs, no rubs, no gallops RESPIRATORY:  Clear to auscultation without rales, wheezing or rhonchi  ABDOMEN: Soft, non-tender, non-distended EXTREMITIES:  No edema; No deformity   ASSESSMENT AND PLAN: .    Assessment and Plan    Syncope-possible Frequent syncope with dizziness and falls, sometimes resulting in loss of consciousness. Differential includes cardiac  arrhythmias and neurological causes. Prior EKGs show normal sinus rhythm. Considering palpitations and metoprolol use, further cardiac monitoring is warranted. Discussed Zio patch for two weeks to detect arrhythmias and updating echocardiogram to assess cardiac function due to increased left atrial pressures and normal ejection fraction. - Place Zio patch for two weeks - Order echocardiogram -Baseline tachycardia noted.  Hypertension Chronic hypertension managed with olmesartan and metoprolol.  No changes made today.  Unable to obtain accurate blood pressure via arm cuff.  Hyperlipidemia Chronic hyperlipidemia managed with lifestyle modifications and medications. Emphasized continuing current management to reduce atherosclerotic cardiovascular disease risk. - Continue current hyperlipidemia management  Aortic Atherosclerosis Aortic atherosclerosis noted on prior CT scan. Emphasized managing cardiovascular risk factors to prevent progression. - Continue current management  Obstructive Sleep Apnea Obstructive sleep apnea managed with CPAP therapy. No recent issues reported. Reinforced continued CPAP use to manage symptoms and improve cardiovascular health. - Continue CPAP therapy  Diabetes Mellitus Type 2 diabetes mellitus, well-managed with recent hemoglobin A1c of 7.7%. Follows up with endocrinologist Dr. Talmage Nap. Emphasized regular blood work and follow-up for effective management. - Continue current diabetes management - Follow up with endocrinologist as scheduled  General Health Maintenance Uses compression hose for leg swelling, though not the recommended strength. Encouraged daily use and discussed potential benefits of increasing compression strength. - Encourage daily use of compression hose - Consider increasing compression strength  Follow-up - Follow up with cardiologist after Zio patch monitoring and echocardiogram.               Signed, Donato Schultz, MD

## 2023-11-24 NOTE — Patient Instructions (Signed)
Medication Instructions:  The current medical regimen is effective;  continue present plan and medications.  *If you need a refill on your cardiac medications before your next appointment, please call your pharmacy*  Testing/Procedures: Your physician has requested that you have an echocardiogram. Echocardiography is a painless test that uses sound waves to create images of your heart. It provides your doctor with information about the size and shape of your heart and how well your heart's chambers and valves are working. This procedure takes approximately one hour. There are no restrictions for this procedure. Please do NOT wear cologne, perfume, aftershave, or lotions (deodorant is allowed). Please arrive 15 minutes prior to your appointment time.  Please note: We ask at that you not bring children with you during ultrasound (echo/ vascular) testing. Due to room size and safety concerns, children are not allowed in the ultrasound rooms during exams. Our front office staff cannot provide observation of children in our lobby area while testing is being conducted. An adult accompanying a patient to their appointment will only be allowed in the ultrasound room at the discretion of the ultrasound technician under special circumstances. We apologize for any inconvenience.  ZIO XT- Long Term Monitor Instructions  Your physician has requested you wear a ZIO patch monitor for 14 days.  This is a single patch monitor. [employer] supplies one patch monitor per enrollment. Additional stickers are not available. Please do not apply patch if you will be having a Nuclear Stress Test,  Echocardiogram, Cardiac CT, MRI, or Chest Xray during the period you would be wearing the  monitor. The patch cannot be worn during these tests. You cannot remove and re-apply the  ZIO XT patch monitor.  Your ZIO patch monitor will be mailed 3 day USPS to your address on file. It may take 3-5 days  to receive your monitor after  you have been enrolled.  Once you have received your monitor, please review the enclosed instructions. Your monitor  has already been registered assigning a specific monitor serial # to you.  Billing and Patient Assistance Program Information  We have supplied [employer] with any of your insurance information on file for billing purposes. [employer] offers a sliding scale Patient Assistance Program for patients that do not have  insurance, or whose insurance does not completely cover the cost of the ZIO monitor.  You must apply for the Patient Assistance Program to qualify for this discounted rate.  To apply, please call [employer] at [phone], select option 4, select option 2, ask to apply for  Patient Assistance Program. Meredeth Ide will ask your household income, and how many people  are in your household. They will quote your out-of-pocket cost based on that information.  [employer] will also be able to set up a 84-month, interest-free payment plan if needed.  Applying the monitor   Shave hair from upper left chest.  Hold abrader disc by orange tab. Rub abrader in 40 strokes over the upper left chest as  indicated in your monitor instructions.  Clean area with 4 enclosed alcohol pads. Let dry.  Apply patch as indicated in monitor instructions. Patch will be placed under collarbone on left  side of chest with arrow pointing upward.  Rub patch adhesive wings for 2 minutes. Remove white label marked "1". Remove the white  label marked "2". Rub patch adhesive wings for 2 additional minutes.  While looking in a mirror, press and release button in center of patch. A small green light will  flash  3-4 times. This will be your only indicator that the monitor has been turned on.  Do not shower for the first 24 hours. You may shower after the first 24 hours.  Press the button if you feel a symptom. You will hear a small click. Record Date, Time and  Symptom in the Patient Logbook.  When you are ready to  remove the patch, follow instructions on the last 2 pages of Patient  Logbook. Stick patch monitor onto the last page of Patient Logbook.  Place Patient Logbook in the blue and white box. Use locking tab on box and tape box closed  securely. The blue and white box has prepaid postage on it. Please place it in the mailbox as  soon as possible. Your physician should have your test results approximately 7 days after the  monitor has been mailed back to [facility]  Call [facility] Customer Care at [phone] if you have questions regarding  your ZIO XT patch monitor. Call them immediately if you see an orange light blinking on your  monitor.  If your monitor falls off in less than 4 days, contact our Monitor department at [phone].  If your monitor becomes loose or falls off after 4 days call [employer] at [phone] for  suggestions on securing your monitor    Follow-Up: At [facility], you and your health needs are our priority.  As part of our continuing mission to provide you with exceptional heart care, we have created designated Provider Care Teams.  These Care Teams include your primary Cardiologist (physician) and Advanced Practice Providers (APPs -  Physician Assistants and Nurse Practitioners) who all work together to provide you with the care you need, when you need it.  We recommend signing up for the patient portal called "MyChart".  Sign up information is provided on this After Visit Summary.  MyChart is used to connect with patients for Virtual Visits (Telemedicine).  Patients are able to view lab/test results, encounter notes, upcoming appointments, etc.  Non-urgent messages can be sent to your provider as well.   To learn more about what you can do with MyChart, go to [email or web address].    Your next appointment:   1 year(s)  Provider:   Donato Schultz, MD

## 2023-11-28 ENCOUNTER — Other Ambulatory Visit: Payer: Self-pay

## 2023-11-28 ENCOUNTER — Telehealth: Payer: Self-pay | Admitting: Cardiology

## 2023-11-28 DIAGNOSIS — I10 Essential (primary) hypertension: Secondary | ICD-10-CM

## 2023-11-28 DIAGNOSIS — I5032 Chronic diastolic (congestive) heart failure: Secondary | ICD-10-CM

## 2023-11-28 MED ORDER — FUROSEMIDE 20 MG PO TABS: 20.0000 mg | ORAL_TABLET | Freq: Every day | ORAL | 3 refills | Status: DC

## 2023-11-28 MED ORDER — METOPROLOL TARTRATE 100 MG PO TABS: 100.0000 mg | ORAL_TABLET | Freq: Two times a day (BID) | ORAL | 0 refills | Status: DC

## 2023-11-28 NOTE — Telephone Encounter (Signed)
*  STAT* If patient is at the pharmacy, call can be transferred to refill team.   1. Which medications need to be refilled? (please list name of each medication and dose if known) metoprolol tartrate (LOPRESSOR) 100 MG tablet    furosemide (LASIX) 20 MG tablet   2. Would you like to learn more about the convenience, safety, & potential cost savings by using the [facility]? No   3. Are you open to using the [facility] No   4. Which pharmacy/location (including street and city if local pharmacy) is [facility]   Pt would like a 3-5 tablets of the Metoprolol sent to the [id number] - [place] - [address] AT [facility] to help last until she received the rest of the medication through [facility].  5. Do they need a 30 day or 90 day supply? 90 Day Supply for both medications.   Pt is currently out of medications.

## 2023-11-29 ENCOUNTER — Other Ambulatory Visit: Payer: Self-pay | Admitting: Cardiology

## 2023-11-29 DIAGNOSIS — I10 Essential (primary) hypertension: Secondary | ICD-10-CM

## 2023-11-29 DIAGNOSIS — I5032 Chronic diastolic (congestive) heart failure: Secondary | ICD-10-CM

## 2023-11-29 MED ORDER — METOPROLOL TARTRATE 100 MG PO TABS: 100.0000 mg | ORAL_TABLET | Freq: Two times a day (BID) | ORAL | 3 refills | Status: DC

## 2023-12-01 DIAGNOSIS — G609 Hereditary and idiopathic neuropathy, unspecified: Secondary | ICD-10-CM | POA: Diagnosis not present

## 2023-12-01 DIAGNOSIS — E1165 Type 2 diabetes mellitus with hyperglycemia: Secondary | ICD-10-CM | POA: Diagnosis not present

## 2023-12-14 DIAGNOSIS — R55 Syncope and collapse: Secondary | ICD-10-CM | POA: Diagnosis not present

## 2023-12-16 ENCOUNTER — Encounter: Payer: Self-pay | Admitting: Cardiology

## 2023-12-16 ENCOUNTER — Ambulatory Visit (HOSPITAL_COMMUNITY): Payer: Medicare Other

## 2024-01-02 ENCOUNTER — Ambulatory Visit (HOSPITAL_COMMUNITY): Payer: Medicare Other | Attending: Cardiology

## 2024-01-02 DIAGNOSIS — R55 Syncope and collapse: Secondary | ICD-10-CM | POA: Diagnosis not present

## 2024-01-02 DIAGNOSIS — I5032 Chronic diastolic (congestive) heart failure: Secondary | ICD-10-CM | POA: Insufficient documentation

## 2024-01-02 LAB — ECHOCARDIOGRAM COMPLETE
Area-P 1/2: 3.91 cm2
Calc EF: 62.5 %
S' Lateral: 3 cm
Single Plane A2C EF: 58 %
Single Plane A4C EF: 65.7 %

## 2024-01-03 ENCOUNTER — Encounter: Payer: Self-pay | Admitting: Cardiology

## 2024-01-13 DIAGNOSIS — E1165 Type 2 diabetes mellitus with hyperglycemia: Secondary | ICD-10-CM | POA: Diagnosis not present

## 2024-01-13 DIAGNOSIS — I700 Atherosclerosis of aorta: Secondary | ICD-10-CM | POA: Diagnosis not present

## 2024-01-13 DIAGNOSIS — E1140 Type 2 diabetes mellitus with diabetic neuropathy, unspecified: Secondary | ICD-10-CM | POA: Diagnosis not present

## 2024-01-13 DIAGNOSIS — I10 Essential (primary) hypertension: Secondary | ICD-10-CM | POA: Diagnosis not present

## 2024-01-23 DIAGNOSIS — E1165 Type 2 diabetes mellitus with hyperglycemia: Secondary | ICD-10-CM | POA: Diagnosis not present

## 2024-01-23 DIAGNOSIS — E1140 Type 2 diabetes mellitus with diabetic neuropathy, unspecified: Secondary | ICD-10-CM | POA: Diagnosis not present

## 2024-01-23 DIAGNOSIS — I700 Atherosclerosis of aorta: Secondary | ICD-10-CM | POA: Diagnosis not present

## 2024-01-23 DIAGNOSIS — I10 Essential (primary) hypertension: Secondary | ICD-10-CM | POA: Diagnosis not present

## 2024-01-23 DIAGNOSIS — H4089 Other specified glaucoma: Secondary | ICD-10-CM | POA: Diagnosis not present

## 2024-01-23 DIAGNOSIS — E782 Mixed hyperlipidemia: Secondary | ICD-10-CM | POA: Diagnosis not present

## 2024-02-06 ENCOUNTER — Telehealth: Payer: Self-pay

## 2024-02-06 DIAGNOSIS — I10 Essential (primary) hypertension: Secondary | ICD-10-CM

## 2024-02-06 NOTE — Patient Outreach (Signed)
 Submitted faxed referral for assistance.  [facility]  Population Health Care Management Assistant  Direct Dial: [phone]  Fax: [phone] Website: [email or web address]

## 2024-02-07 ENCOUNTER — Telehealth: Payer: Self-pay | Admitting: *Deleted

## 2024-02-07 NOTE — Progress Notes (Signed)
 Complex Care Management Note Care Guide Note  02/07/2024 Name: Jessica [name] MRN: [id number] DOB: [birth year]   Complex Care Management Outreach Attempts: An unsuccessful telephone outreach was attempted today to offer the patient information about available complex care management services.  Follow Up Plan:  Additional outreach attempts will be made to offer the patient complex care management information and services.   Encounter Outcome:  No Answer  Barnie Bora  [facility]  [facility], Outpatient Care[facility] Guide  Direct Dial: [phone]  Fax [phone]

## 2024-02-11 DIAGNOSIS — I10 Essential (primary) hypertension: Secondary | ICD-10-CM | POA: Diagnosis not present

## 2024-02-11 DIAGNOSIS — E1165 Type 2 diabetes mellitus with hyperglycemia: Secondary | ICD-10-CM | POA: Diagnosis not present

## 2024-02-11 DIAGNOSIS — E1140 Type 2 diabetes mellitus with diabetic neuropathy, unspecified: Secondary | ICD-10-CM | POA: Diagnosis not present

## 2024-02-11 DIAGNOSIS — I700 Atherosclerosis of aorta: Secondary | ICD-10-CM | POA: Diagnosis not present

## 2024-02-14 NOTE — Progress Notes (Signed)
 Complex Care Management Note Care Guide Note  02/14/2024 Name: ASUNA PETH MRN: [id number] DOB: February 17, 1946   Complex Care Management Outreach Attempts: A second unsuccessful outreach was attempted today to offer the patient with information about available complex care management services.  Follow Up Plan:  Additional outreach attempts will be made to offer the patient complex care management information and services.   Encounter Outcome:  No Answer  Barnie Bora  [facility]  [facility], [facility] Guide  Direct Dial: [phone]  Fax [phone]

## 2024-02-15 NOTE — Progress Notes (Signed)
 Complex Care Management Note Care Guide Note  02/15/2024 Name: KEI LANGHORST MRN: [id number] DOB: 14-Jan-1946   Complex Care Management Outreach Attempts: A third unsuccessful outreach was attempted today to offer the patient with information about available complex care management services.  Follow Up Plan:  No further outreach attempts will be made at this time. We have been unable to contact the patient to offer or enroll patient in complex care management services.  Encounter Outcome:  No Answer Barnie Bora  [facility]  [facility], [facility] Guide  Direct Dial: [phone]  Fax [phone]

## 2024-02-21 DIAGNOSIS — E7800 Pure hypercholesterolemia, unspecified: Secondary | ICD-10-CM | POA: Diagnosis not present

## 2024-02-21 DIAGNOSIS — I10 Essential (primary) hypertension: Secondary | ICD-10-CM | POA: Diagnosis not present

## 2024-02-21 DIAGNOSIS — Z9641 Presence of insulin pump (external) (internal): Secondary | ICD-10-CM | POA: Diagnosis not present

## 2024-02-21 DIAGNOSIS — G609 Hereditary and idiopathic neuropathy, unspecified: Secondary | ICD-10-CM | POA: Diagnosis not present

## 2024-02-21 DIAGNOSIS — E1165 Type 2 diabetes mellitus with hyperglycemia: Secondary | ICD-10-CM | POA: Diagnosis not present

## 2024-02-21 DIAGNOSIS — E669 Obesity, unspecified: Secondary | ICD-10-CM | POA: Diagnosis not present

## 2024-02-22 DIAGNOSIS — E1165 Type 2 diabetes mellitus with hyperglycemia: Secondary | ICD-10-CM | POA: Diagnosis not present

## 2024-02-22 DIAGNOSIS — H4089 Other specified glaucoma: Secondary | ICD-10-CM | POA: Diagnosis not present

## 2024-02-22 DIAGNOSIS — I700 Atherosclerosis of aorta: Secondary | ICD-10-CM | POA: Diagnosis not present

## 2024-02-22 DIAGNOSIS — I10 Essential (primary) hypertension: Secondary | ICD-10-CM | POA: Diagnosis not present

## 2024-02-22 DIAGNOSIS — E782 Mixed hyperlipidemia: Secondary | ICD-10-CM | POA: Diagnosis not present

## 2024-02-22 DIAGNOSIS — E1140 Type 2 diabetes mellitus with diabetic neuropathy, unspecified: Secondary | ICD-10-CM | POA: Diagnosis not present

## 2024-03-12 DIAGNOSIS — I10 Essential (primary) hypertension: Secondary | ICD-10-CM | POA: Diagnosis not present

## 2024-03-12 DIAGNOSIS — E1165 Type 2 diabetes mellitus with hyperglycemia: Secondary | ICD-10-CM | POA: Diagnosis not present

## 2024-03-12 DIAGNOSIS — E1140 Type 2 diabetes mellitus with diabetic neuropathy, unspecified: Secondary | ICD-10-CM | POA: Diagnosis not present

## 2024-03-12 DIAGNOSIS — I700 Atherosclerosis of aorta: Secondary | ICD-10-CM | POA: Diagnosis not present

## 2024-03-13 DIAGNOSIS — I10 Essential (primary) hypertension: Secondary | ICD-10-CM | POA: Diagnosis not present

## 2024-03-13 DIAGNOSIS — E1140 Type 2 diabetes mellitus with diabetic neuropathy, unspecified: Secondary | ICD-10-CM | POA: Diagnosis not present

## 2024-03-13 DIAGNOSIS — E782 Mixed hyperlipidemia: Secondary | ICD-10-CM | POA: Diagnosis not present

## 2024-03-13 DIAGNOSIS — D649 Anemia, unspecified: Secondary | ICD-10-CM | POA: Diagnosis not present

## 2024-03-24 DIAGNOSIS — I700 Atherosclerosis of aorta: Secondary | ICD-10-CM | POA: Diagnosis not present

## 2024-03-24 DIAGNOSIS — E782 Mixed hyperlipidemia: Secondary | ICD-10-CM | POA: Diagnosis not present

## 2024-03-24 DIAGNOSIS — H4089 Other specified glaucoma: Secondary | ICD-10-CM | POA: Diagnosis not present

## 2024-03-24 DIAGNOSIS — I10 Essential (primary) hypertension: Secondary | ICD-10-CM | POA: Diagnosis not present

## 2024-03-24 DIAGNOSIS — E1140 Type 2 diabetes mellitus with diabetic neuropathy, unspecified: Secondary | ICD-10-CM | POA: Diagnosis not present

## 2024-03-24 DIAGNOSIS — E1165 Type 2 diabetes mellitus with hyperglycemia: Secondary | ICD-10-CM | POA: Diagnosis not present

## 2024-03-27 DIAGNOSIS — H4089 Other specified glaucoma: Secondary | ICD-10-CM | POA: Diagnosis not present

## 2024-03-27 DIAGNOSIS — E113293 Type 2 diabetes mellitus with mild nonproliferative diabetic retinopathy without macular edema, bilateral: Secondary | ICD-10-CM | POA: Diagnosis not present

## 2024-03-27 DIAGNOSIS — N3946 Mixed incontinence: Secondary | ICD-10-CM | POA: Diagnosis not present

## 2024-03-27 DIAGNOSIS — E1165 Type 2 diabetes mellitus with hyperglycemia: Secondary | ICD-10-CM | POA: Diagnosis not present

## 2024-03-27 DIAGNOSIS — Z6841 Body Mass Index (BMI) 40.0 and over, adult: Secondary | ICD-10-CM | POA: Diagnosis not present

## 2024-03-27 DIAGNOSIS — G4733 Obstructive sleep apnea (adult) (pediatric): Secondary | ICD-10-CM | POA: Diagnosis not present

## 2024-03-27 DIAGNOSIS — E1140 Type 2 diabetes mellitus with diabetic neuropathy, unspecified: Secondary | ICD-10-CM | POA: Diagnosis not present

## 2024-03-27 DIAGNOSIS — E782 Mixed hyperlipidemia: Secondary | ICD-10-CM | POA: Diagnosis not present

## 2024-03-27 DIAGNOSIS — Z1231 Encounter for screening mammogram for malignant neoplasm of breast: Secondary | ICD-10-CM | POA: Diagnosis not present

## 2024-03-27 DIAGNOSIS — I10 Essential (primary) hypertension: Secondary | ICD-10-CM | POA: Diagnosis not present

## 2024-03-27 DIAGNOSIS — Z9641 Presence of insulin pump (external) (internal): Secondary | ICD-10-CM | POA: Diagnosis not present

## 2024-03-27 DIAGNOSIS — B372 Candidiasis of skin and nail: Secondary | ICD-10-CM | POA: Diagnosis not present

## 2024-03-27 DIAGNOSIS — Z7409 Other reduced mobility: Secondary | ICD-10-CM | POA: Diagnosis not present

## 2024-03-28 ENCOUNTER — Telehealth: Payer: Self-pay | Admitting: *Deleted

## 2024-03-28 ENCOUNTER — Telehealth: Payer: Self-pay

## 2024-03-28 DIAGNOSIS — E1169 Type 2 diabetes mellitus with other specified complication: Secondary | ICD-10-CM

## 2024-03-28 NOTE — Progress Notes (Signed)
 Complex Care Management Note  Care Guide Note 03/28/2024 Name: Jessica [name] MRN: [id number] DOB: 11-14-1945  Jessica [name] is a 78 y.o. year old female who sees Helyn Lobstein, MD for primary care. I reached out to Geofm Killer by phone today to offer complex care management services.  Ms. Posas was given information about Complex Care Management services today including:   The Complex Care Management services include support from the care team which includes your Nurse Care Manager, Clinical Social Worker, or Pharmacist.  The Complex Care Management team is here to help remove barriers to the health concerns and goals most important to you. Complex Care Management services are voluntary, and the patient may decline or stop services at any time by request to their care team member.   Complex Care Management Consent Status: Patient agreed to services and verbal consent obtained.   Follow up plan:  Telephone appointment with complex care management team member scheduled for:  03/30/24  Encounter Outcome:  Patient Scheduled  Barnie Bora  [facility]  [facility], [facility] Guide  Direct Dial: [phone]  Fax [phone]

## 2024-03-30 ENCOUNTER — Other Ambulatory Visit: Payer: Self-pay

## 2024-03-30 VITALS — BP 127/63 | Wt 246.0 lb

## 2024-03-30 DIAGNOSIS — Z139 Encounter for screening, unspecified: Secondary | ICD-10-CM

## 2024-03-30 NOTE — Patient Outreach (Signed)
 Complex Care Management   Visit Note  03/30/2024  Name:  Jessica [name] MRN: [id number] DOB: [birth year]  Situation: Referral received for Complex Care Management related to Dementia and SDOH Barriers:  Transportation I obtained verbal consent from Patient.  Visit completed with Jessica [name]  on the phone  Background:   Past Medical History:  Diagnosis Date   Anemia    Arthritis    Cataract    Diabetes mellitus without complication (HCC)    Diastolic dysfunction    Frozen shoulder    Glaucoma    Heart murmur    History of kidney stones    Hyperlipidemia    Hypertension    Macular degeneration    Neuropathy    Obesity    Sleep apnea    Stroke [facility])    has been told by a Dr. Jaycee Metro that she may have had a mini stroke but no further testing was done to confirm   Tachycardia     Assessment: Patient Reported Symptoms:  Cognitive Cognitive Status: Able to follow simple commands, Alert and oriented to person, place, and time, Normal speech and language skills Cognitive/Intellectual Conditions Management [RPT]: None reported or documented in medical history or problem list      Neurological Neurological Review of Symptoms: Dizziness Neurological Conditions:  (Vertigo) Neurological Management Strategies: Adequate rest, Exercise, Medical device Neurological Comment: Dizziness/lightheadedness this morning after standing up from the toilet. She steadied herself by holding onto grab bar until episode passed.  HEENT HEENT Symptoms Reported: Runny nose (due to seasonal allergies) HEENT Conditions: Vision problem(s) Vision Problems: glaucoma HEENT Management Strategies: Medication therapy, Medical device Vision problem(s)  Cardiovascular Cardiovascular Symptoms Reported: Swelling in legs or feet, Dizziness, Lightheadness (Swelling in bilateral feet and ankles, L>R. At baseline per patient. See Neuro assessment for information regarding dizziness/lightheadedness.) Does  patient have uncontrolled Hypertension?: Yes (Patient notes her diastolic pressure often drops below 50) Is patient checking Blood Pressure at home?: Yes (Patient is checking every day. Her blood pressure cuff sends results to a nurse through her PCP office.) Patient's Recent BP reading at home: 127/63 (Patient reported) Cardiovascular Conditions: Heart failure, High blood cholesterol, Hypertension Cardiovascular Management Strategies: Medical device, Medication therapy, Routine screening, Exercise, Weight management Do You Have a Working Readable Scale?: Yes Weight: 246 lb (111.6 kg) (Patient reported)  Respiratory Respiratory Symptoms Reported: No symptoms reported Respiratory Conditions: Sleep disordered breathing (Wears CPAP)  Endocrine Patient reports the following symptoms related to hypoglycemia or hyperglycemia : No symptoms reported Is patient diabetic?: Yes Is patient checking blood sugars at home?: Yes Endocrine Conditions: Diabetes Endocrine Management Strategies: Medication therapy, Coping strategies, Routine screening, Diet modification, Exercise, Weight management Endocrine Comment: Last visit with endocrinology 03/28/24  Gastrointestinal Gastrointestinal Symptoms Reported: No symptoms reported (Last BM today) Additional Gastrointestinal Details: Patient reports her appetite is good. She reports she was having issues with diarrhea for about a week. She is unsure if this was due to something she ate. Patient reports that diarrhea is now resolved. Gastrointestinal Management Strategies: Diet modification, Exercise Gastrointestinal Comment: Patient reports she is in a program called [employer] for weight loss. Nutrition Risk Screen (CP): No indicators present  Genitourinary Genitourinary Symptoms Reported: No symptoms reported    Integumentary Integumentary Symptoms Reported: No symptoms reported    Musculoskeletal Musculoskelatal Symptoms Reviewed: No symptoms reported Musculoskeletal  Conditions: Other, Joint pain (L knee "bone on bone" per patient. She does follow with ortho for injections, but it has been over 1.5 years per patient.)  Other Musculoskeletal Conditions: Neuropathy Musculoskeletal Management Strategies: Medical device, Exercise, Adequate rest Musculoskeletal Comment: Patient reports she has previously done outpatient PT. Falls in the past year?: Yes Number of falls in past year: 2 or more (As a result of passing out. Patient was evaluated by cardiology for syncope.) Was there an injury with Fall?: No Fall Risk Category Calculator: 2 Patient Fall Risk Level: Moderate Fall Risk Patient at Risk for Falls Due to: History of fall(s) Fall risk Follow up: Falls evaluation completed, Education provided, Falls prevention discussed  Psychosocial Psychosocial Symptoms Reported: No symptoms reported     Quality of Family Relationships: helpful, involved, supportive Do you feel physically threatened by others?: No      03/30/2024   10:41 AM  Depression screen PHQ 2/9  Decreased Interest 0  Down, Depressed, Hopeless 0  PHQ - 2 Score 0    Vitals:   03/30/24 1010  BP: 127/63    Medications Reviewed Today     Reviewed by Valaria Garland, RN (Registered Nurse) on 03/30/24 at 1044  Med List Status: <None>   Medication Order Taking? Sig Documenting Provider Last Dose Status Informant  Cholecalciferol  (VITAMIN D3) 50 MCG (2000 UT) capsule [id number] Yes Take 2,000 Units by mouth daily. [provider] Taking Active Self  Coenzyme Q10 (CO Q-10) 100 MG CAPS [id number] Yes Take 100 mg by mouth at bedtime.  [provider] Taking Active Self  dorzolamidel-timolol  (COSOPT ) 22.3-6.8 MG/ML SOLN ophthalmic solution [id number] Yes Place 1 drop into both eyes 2 (two) times daily. [provider] Taking Active Self  furosemide  (LASIX ) 20 MG tablet [id number] Yes Take 1 tablet (20 mg total) by mouth daily. Hugh Madura, MD Taking Active   Garlic  (GARLIQUE PO) [id number] Yes Take by mouth. [provider] Taking Active   ibuprofen  (ADVIL ) 600 MG tablet [id number] Yes Take 1 tablet (600 mg total) by mouth every 6 (six) hours as needed. Arlee Lace, MD Taking Active Self  insulin  lispro (HUMALOG ) 100 UNIT/ML injection [id number] Yes Insulin  pump  Patient taking differently: Inject 3 Units into the skin continuous. Insulin  pump. Maximum daily dose - 150 units   Medina-Vargas, Monina C, NP Taking Active Self  Iron-Vitamin C 65-125 MG TABS [id number] Yes Take 1 tablet by mouth at bedtime.  [provider] Taking Active Self  melatonin 5 MG TABS [id number] No Take 5 mg by mouth at bedtime.  Patient not taking: Reported on 11/24/2023   [provider] Not Taking Active Self  metFORMIN  (GLUCOPHAGE -XR) 500 MG 24 hr tablet [id number] No Take 1 tablet (500 mg total) by mouth at bedtime.  Patient not taking: Reported on 11/24/2023   Medina-Vargas, Sid Dragon, NP Not Taking Active Self  metoprolol  tartrate (LOPRESSOR ) 100 MG tablet [id number] Yes Take 1 tablet (100 mg total) by mouth 2 (two) times daily. Hugh Madura, MD Taking Active   Multiple Vitamin (MULTIVITAMIN WITH MINERALS) TABS tablet [id number]  Take 1 tablet by mouth daily. [provider]  Active Self  Multiple Vitamins-Minerals (PRESERVISION AREDS 2 PO) [id number]  Take 1 tablet by mouth 2 (two) times daily. [provider]  Active Self  nystatin cream (MYCOSTATIN) [id number]  Apply 1 Application topically 2 (two) times daily. [provider]  Active   olmesartan (BENICAR) 40 MG tablet [id number]  Take 40 mg by mouth daily. [provider]  Active Self  oxybutynin (DITROPAN-XL) 5 MG 24 hr tablet [id number]  Take 5 mg by mouth at bedtime. [provider]  Active Self  phenazopyridine (PYRIDIUM) 95 MG tablet [id number]  Take 95 mg by mouth 3 (three) times daily as needed for pain. [provider]  Active   simvastatin  (ZOCOR ) 40  MG tablet [id number]  Take 1 tablet (40 mg total) by mouth every evening. Medina-Vargas, Monina C, NP  Active Self  spironolactone  (ALDACTONE ) 25 MG tablet [id number]  TAKE 1/2 TABLET(12.5MG      TOTAL) DAILY  Patient not taking: Reported on 11/24/2023   Hugh Madura, MD  Active   Travoprost, BAK Free, (TRAVATAN) 0.004 % SOLN ophthalmic solution [id number]  Place 1 drop into both eyes at bedtime.  [provider]  Active Self            Recommendation:   Referral to: community care guide for transportation resources Continue Current Plan of Care Patient will call insurance to ask if her plan covers transportation  Follow Up Plan:   Telephone follow up appointment date/time:  04/16/24 at 10:30 AM Referral to Care Guide  Theodora Fish, RN MSN [place]  [facility] RN Care Manager Direct Dial: [phone]  Fax: [phone]

## 2024-03-30 NOTE — Patient Instructions (Signed)
 Visit Information  Thank you for taking time to visit with me today. Please don't hesitate to contact me if I can be of assistance to you before our next scheduled appointment.  Our next appointment is by telephone on 04/16/24 at 10:30 AM Please call the care guide team at [phone] if you need to cancel or reschedule your appointment.   Following is a copy of your care plan:   Goals Addressed             This Visit's Progress    VBCI RN Care Plan related to diabetes   On track    Problems:  Chronic Disease Management support and education needs related to DMII  Goal: Over the next 30 days the Patient will demonstrate Improved adherence to prescribed treatment plan for DMII as evidenced by improved A1c (most recent 7.7%) take all medications exactly as prescribed and will call provider for medication related questions as evidenced by patient report of medication adherence     Interventions:   Diabetes Interventions: Assessed patient's understanding of A1c goal: <7% Reviewed medications with patient and discussed importance of medication adherence Discussed plans with patient for ongoing care management follow up and provided patient with direct contact information for care management team Provided patient with written educational materials related to hypo and hyperglycemia and importance of correct treatment Advised patient, providing education and rationale, to check cbg before meals and at bedtime and record, calling PCP or endocrinology for findings outside established parameters Screening for signs and symptoms of depression related to chronic disease state  Assessed social determinant of health barriers Lab Results  Component Value Date   HGBA1C 6.9 (H) 04/18/2019    Patient Self-Care Activities:  Attend all scheduled provider appointments Call pharmacy for medication refills 3-7 days in advance of running out of medications Call provider office for new concerns or  questions  Take medications as prescribed   check blood sugar at prescribed times: before meals and at bedtime check feet daily for cuts, sores or redness  Plan:  Telephone follow up appointment with care management team member scheduled for:  04/16/24 at 10:30 AM          VBCI RN Care Plan related to transportation   On track    Problems:  Care Coordination needs related to Transportation  Goal: Over the next 30 days the Patient will secure reliable transportation to medical appointments or for daily needs if friends/neighbors are not available  Interventions:   SDOH Barriers Patient interviewed and SDOH assessment performed        SDOH Interventions    Flowsheet Row Patient Outreach Telephone from 03/30/2024 in [place] POPULATION HEALTH DEPARTMENT Patient Outreach Telephone from 09/26/2020 in [facility] Community Care Coordination  SDOH Interventions    Food Insecurity Interventions Intervention Not Indicated [employer] Referral  [employer] through [employer] [place]]  Housing Interventions Intervention Not Indicated Intervention Not Indicated  Transportation Interventions Patient Resources (Friends/Family), AMB Referral  [Patient has several friends that help to provide transportation. She applied to [employer] but was rejected. She thinks that she answered a question wrong and plans to reapply. Patient plans to call insurance to ask about transportation benefit.] [employer], [employer] ([employer])  [[employer] Application mailed to patient.]  Utilities Interventions Intervention Not Indicated --  Financial Strain Interventions -- Intervention Not Indicated  Physical Activity Interventions -- Intervention Not Indicated  Stress Interventions -- Intervention Not Indicated  Social Connections Interventions --  Intervention Not Indicated      Patient interviewed and appropriate assessments performed Referred patient  to community resources care guide team for assistance with transportation resources Discussed plans with patient for ongoing care management follow up and provided patient with direct contact information for care management team Advised patient to call number on back of insurance card to ask if her plan includes transportation benefits  Patient Self-Care Activities:  Attend all scheduled provider appointments Call provider office for new concerns or questions  Take medications as prescribed    Plan:  Telephone follow up appointment with care management team member scheduled for:  04/16/24 at 10:30 AM The patient will call insurance as advised to ask about transportation benefit.  Referral to community care guide placed for transportation resources             Please call the Suicide and Crisis Lifeline: 988 call 1-800-273-TALK (toll free, 24 hour hotline) if you are experiencing a Mental Health or Behavioral Health Crisis or need someone to talk to.  Patient verbalizes understanding of instructions and care plan provided today and agrees to view in MyChart. Active MyChart status and patient understanding of how to access instructions and care plan via MyChart confirmed with patient.     Theodora Fish, RN MSN [place]  VBCI Population Health RN Care Manager Direct Dial: [phone]  Fax: [phone]

## 2024-04-04 ENCOUNTER — Telehealth: Payer: Self-pay

## 2024-04-04 NOTE — Progress Notes (Signed)
 Complex Care Management Note Care Guide Note  04/04/2024 Name: Jessica [name] MRN: [id number] DOB: 27-May-1946   Complex Care Management Outreach Attempts: An unsuccessful telephone outreach was attempted today to offer the patient information about available complex care management services.  Follow Up Plan:  Additional outreach attempts will be made to offer the patient complex care management information and services.   Encounter Outcome:  No Answer  Inell Mimbs [facility]  [facility] Guide Direct Dial: [phone]  Fax: [phone] Website: [email or web address]

## 2024-04-05 ENCOUNTER — Telehealth: Payer: Self-pay

## 2024-04-05 NOTE — Progress Notes (Signed)
 Complex Care Management Note Care Guide Note  04/05/2024 Name: Jessica [name] MRN: [id number] DOB: [birth year]   Complex Care Management Outreach Attempts: A second unsuccessful outreach was attempted today to offer the patient with information about available complex care management services.  Follow Up Plan:  Additional outreach attempts will be made to offer the patient complex care management information and services.   Encounter Outcome:  No Answer  [name] [facility]  [facility] Guide Direct Dial: [phone]  Fax: [phone] Website: [email or web address]

## 2024-04-05 NOTE — Progress Notes (Signed)
 Complex Care Management Note Care Guide Note  04/05/2024 Name: Jessica [name] MRN: [id number] DOB: [birth year]   Complex Care Management Outreach Attempts: A third unsuccessful outreach was attempted today to offer the patient with information about available complex care management services.  Follow Up Plan:  No further outreach attempts will be made at this time. We have been unable to contact the patient to offer or enroll patient in complex care management services.  Encounter Outcome:  No Answer  Dajia Gunnels [facility]  [facility] Guide Direct Dial: [phone]  Fax: [phone] Website: [email or web address]

## 2024-04-11 DIAGNOSIS — I700 Atherosclerosis of aorta: Secondary | ICD-10-CM | POA: Diagnosis not present

## 2024-04-11 DIAGNOSIS — E1140 Type 2 diabetes mellitus with diabetic neuropathy, unspecified: Secondary | ICD-10-CM | POA: Diagnosis not present

## 2024-04-11 DIAGNOSIS — E1165 Type 2 diabetes mellitus with hyperglycemia: Secondary | ICD-10-CM | POA: Diagnosis not present

## 2024-04-11 DIAGNOSIS — I10 Essential (primary) hypertension: Secondary | ICD-10-CM | POA: Diagnosis not present

## 2024-04-16 ENCOUNTER — Other Ambulatory Visit: Payer: Self-pay

## 2024-04-16 NOTE — Patient Outreach (Signed)
 Complex Care Management   Visit Note  04/16/2024  Name:  Jessica [name] MRN: [id number] DOB: July 16, 1946  Situation: Referral received for Complex Care Management related to SDOH Barriers:  Transportation I obtained verbal consent from Patient.  Visit completed with Alithea Wnek  on the phone  Background:   Past Medical History:  Diagnosis Date   Anemia    Arthritis    Cataract    Diabetes mellitus without complication (HCC)    Diastolic dysfunction    Frozen shoulder    Glaucoma    Heart murmur    History of kidney stones    Hyperlipidemia    Hypertension    Macular degeneration    Neuropathy    Obesity    Sleep apnea    Stroke [facility])    has been told by a Dr. Dwight Economy that she may have had a mini stroke but no further testing was done to confirm   Tachycardia     Assessment: Patient Reported Symptoms:  Cognitive Cognitive Status: Able to follow simple commands, Alert and oriented to person, place, and time, Normal speech and language skills Cognitive/Intellectual Conditions Management [RPT]: None reported or documented in medical history or problem list      Neurological Neurological Review of Symptoms: No symptoms reported Neurological Conditions:  (Vertigo) Neurological Management Strategies: Adequate rest, Exercise, Medical device  HEENT HEENT Symptoms Reported: Not assessed      Cardiovascular Cardiovascular Symptoms Reported: Swelling in legs or feet (Patient reports swelling in her legs is going down. She adjusts her bed every night to elevate her feet.) Does patient have uncontrolled Hypertension?: Yes Is patient checking Blood Pressure at home?: Yes Patient's Recent BP reading at home: 140/59 (Patient reported. Taken during call.) Cardiovascular Conditions: Heart failure, High blood cholesterol, Hypertension Cardiovascular Management Strategies: Medical device, Medication therapy, Routine screening, Exercise, Weight management Do You Have a Working  Readable Scale?: Yes Weight: 241 lb (109.3 kg) (Patient reported)  Respiratory Respiratory Symptoms Reported: Not assesed    Endocrine Patient reports the following symptoms related to hypoglycemia or hyperglycemia : No symptoms reported Is patient diabetic?: Yes Is patient checking blood sugars at home?: Yes Endocrine Conditions: Diabetes Endocrine Management Strategies: Medication therapy, Coping strategies, Routine screening, Diet modification, Exercise, Weight management Endocrine Comment: Advised patient to notify endocrinology for continued high blood sugar and/or symptoms. Patient reports she is working on getting a new insulin  pump.  Gastrointestinal Gastrointestinal Symptoms Reported: Not assessed      Genitourinary Genitourinary Symptoms Reported: Not assessed    Integumentary Integumentary Symptoms Reported: Not assessed    Musculoskeletal Musculoskelatal Symptoms Reviewed: Not assessed   Falls in the past year?: Yes Number of falls in past year: 2 or more (No falls since previous CMRN visit) Was there an injury with Fall?: No Fall Risk Category Calculator: 2 Patient Fall Risk Level: Moderate Fall Risk Patient at Risk for Falls Due to: History of fall(s) Fall risk Follow up: Falls evaluation completed, Education provided, Falls prevention discussed  Psychosocial Psychosocial Symptoms Reported: Not assessed            03/30/2024   10:41 AM  Depression screen PHQ 2/9  Decreased Interest 0  Down, Depressed, Hopeless 0  PHQ - 2 Score 0    There were no vitals filed for this visit.  Medications Reviewed Today   Medications were not reviewed in this encounter     Recommendation:   Continue Current Plan of Care Return call to care guide for  transportation resources  Follow Up Plan:   Telephone follow up appointment date/time:  04/30/24 at 10:30 AM  Rosaline Finlay, RN MSN [place]  [facility] RN Care Manager Direct Dial: [phone]  Fax:  [phone]

## 2024-04-16 NOTE — Patient Instructions (Signed)
 Visit Information  Thank you for taking time to visit with me today. Please don't hesitate to contact me if I can be of assistance to you before our next scheduled appointment.  Your next care management appointment is by telephone on 04/30/24 at 10:30 AM  Please call the care guide team at [phone] if you need to cancel, schedule, or reschedule an appointment.   Please call the Suicide and Crisis Lifeline: 988 call 1-800-273-TALK (toll free, 24 hour hotline) if you are experiencing a Mental Health or Behavioral Health Crisis or need someone to talk to.  Rosaline Finlay, RN MSN [place]  [facility] Population Health RN Care Manager Direct Dial: [phone]  Fax: [phone]

## 2024-04-23 DIAGNOSIS — I700 Atherosclerosis of aorta: Secondary | ICD-10-CM | POA: Diagnosis not present

## 2024-04-23 DIAGNOSIS — I10 Essential (primary) hypertension: Secondary | ICD-10-CM | POA: Diagnosis not present

## 2024-04-23 DIAGNOSIS — E1165 Type 2 diabetes mellitus with hyperglycemia: Secondary | ICD-10-CM | POA: Diagnosis not present

## 2024-04-23 DIAGNOSIS — E1140 Type 2 diabetes mellitus with diabetic neuropathy, unspecified: Secondary | ICD-10-CM | POA: Diagnosis not present

## 2024-04-30 ENCOUNTER — Other Ambulatory Visit: Payer: Self-pay

## 2024-04-30 NOTE — Patient Outreach (Signed)
 Complex Care Management   Visit Note  04/30/2024  Name:  Jessica [name] MRN: [id number] DOB: June 22, 1946  Situation: Referral received for Complex Care Management related to SDOH Barriers:  Transportation I obtained verbal consent from Patient.  Visit completed with Jessica [name]  on the phone  Background:   Past Medical History:  Diagnosis Date   Anemia    Arthritis    Cataract    Diabetes mellitus without complication (HCC)    Diastolic dysfunction    Frozen shoulder    Glaucoma    Heart murmur    History of kidney stones    Hyperlipidemia    Hypertension    Macular degeneration    Neuropathy    Obesity    Sleep apnea    Stroke [facility])    has been told by a Dr. Dwight Economy that she may have had a mini stroke but no further testing was done to confirm   Tachycardia     Assessment: Patient Reported Symptoms:  Cognitive Cognitive Status: Able to follow simple commands, Alert and oriented to person, place, and time, Normal speech and language skills Cognitive/Intellectual Conditions Management [RPT]: None reported or documented in medical history or problem list      Neurological Neurological Review of Symptoms: Dizziness (Dizziness on and off) Neurological Management Strategies: Adequate rest, Exercise, Medical device  HEENT HEENT Symptoms Reported: Not assessed      Cardiovascular Cardiovascular Symptoms Reported: Dizziness (Dizziness on and off) Does patient have uncontrolled Hypertension?: Yes Is patient checking Blood Pressure at home?: Yes Patient's Recent BP reading at home: 140/47 at time of assessment Cardiovascular Management Strategies: Medical device, Medication therapy, Routine screening, Exercise, Weight management Do You Have a Working Readable Scale?: Yes Weight: 243 lb 4.8 oz (110.4 kg) (Patient reported) Cardiovascular Comment: Patient reports that her swelling has improved with elevation at nighttime.  Respiratory Respiratory Symptoms  Reported: Not assesed    Endocrine Endocrine Symptoms Reported: No symptoms reported Is patient diabetic?: Yes Is patient checking blood sugars at home?: Yes List most recent blood sugar readings, include date and time of day: FreeStyle Libre. fairly good 156 fasting this morning Endocrine Comment: Patient reports she has an appointment tomorrow with Dr. Becki office to get her set up with her new insulin  pump. She has the equipment already ([employer]). Confirmed patient does have tranpsportation to appointment.  Gastrointestinal Gastrointestinal Symptoms Reported: No symptoms reported Additional Gastrointestinal Details: Patient reports she has been working on adjusting her diet to focus on diabetes management. Patient reports stools have been soft and regular. Gastrointestinal Management Strategies: Diet modification, Exercise Nutrition Risk Screen (CP): No indicators present  Genitourinary Genitourinary Symptoms Reported: Not assessed    Integumentary Integumentary Symptoms Reported: Not assessed    Musculoskeletal Musculoskelatal Symptoms Reviewed: Not assessed        Psychosocial Psychosocial Symptoms Reported: Not assessed            03/30/2024   10:41 AM  Depression screen PHQ 2/9  Decreased Interest 0  Down, Depressed, Hopeless 0  PHQ - 2 Score 0    Vitals:   04/30/24 1031  BP: (!) 140/47  Pulse: 76    Medications Reviewed Today   Medications were not reviewed in this encounter     Recommendation:   Specialty provider follow-up endocrinology 05/01/24 Continue Current Plan of Care  Follow Up Plan:   Telephone follow up appointment date/time:  05/25/24 at 10:30 AM  Rosaline Finlay, RN MSN   [facility] Population  Health RN Care Manager Direct Dial: [phone]  Fax: [phone]

## 2024-04-30 NOTE — Patient Instructions (Signed)
 Visit Information  Thank you for taking time to visit with me today. Please don't hesitate to contact me if I can be of assistance to you before our next scheduled appointment.  Your next care management appointment is by telephone on 05/25/24 at 10:30 AM   Please call the care guide team at [phone] if you need to cancel, schedule, or reschedule an appointment.   Please call the Suicide and Crisis Lifeline: 988 call 1-800-273-TALK (toll free, 24 hour hotline) if you are experiencing a Mental Health or Behavioral Health Crisis or need someone to talk to.  Rosaline Finlay, RN MSN [place]  [facility] Population Health RN Care Manager Direct Dial: [phone]  Fax: [phone]

## 2024-05-11 DIAGNOSIS — E1140 Type 2 diabetes mellitus with diabetic neuropathy, unspecified: Secondary | ICD-10-CM | POA: Diagnosis not present

## 2024-05-11 DIAGNOSIS — I10 Essential (primary) hypertension: Secondary | ICD-10-CM | POA: Diagnosis not present

## 2024-05-11 DIAGNOSIS — E1165 Type 2 diabetes mellitus with hyperglycemia: Secondary | ICD-10-CM | POA: Diagnosis not present

## 2024-05-11 DIAGNOSIS — I700 Atherosclerosis of aorta: Secondary | ICD-10-CM | POA: Diagnosis not present

## 2024-05-24 DIAGNOSIS — E1140 Type 2 diabetes mellitus with diabetic neuropathy, unspecified: Secondary | ICD-10-CM | POA: Diagnosis not present

## 2024-05-24 DIAGNOSIS — I700 Atherosclerosis of aorta: Secondary | ICD-10-CM | POA: Diagnosis not present

## 2024-05-24 DIAGNOSIS — I10 Essential (primary) hypertension: Secondary | ICD-10-CM | POA: Diagnosis not present

## 2024-05-24 DIAGNOSIS — H4089 Other specified glaucoma: Secondary | ICD-10-CM | POA: Diagnosis not present

## 2024-05-24 DIAGNOSIS — E782 Mixed hyperlipidemia: Secondary | ICD-10-CM | POA: Diagnosis not present

## 2024-05-24 DIAGNOSIS — E1165 Type 2 diabetes mellitus with hyperglycemia: Secondary | ICD-10-CM | POA: Diagnosis not present

## 2024-05-25 ENCOUNTER — Other Ambulatory Visit: Payer: Self-pay

## 2024-05-25 NOTE — Patient Outreach (Signed)
 Complex Care Management   Visit Note  05/25/2024  Name:  Jessica [name] MRN: [id number] DOB: May 20, 1946  Situation: Referral received for Complex Care Management related to Diabetes with Complications and SDOH Barriers:  Transportation I obtained verbal consent from Patient.  Visit completed with Jessica [name]  on the phone  Background:   Past Medical History:  Diagnosis Date   Anemia    Arthritis    Cataract    Diabetes mellitus without complication (HCC)    Diastolic dysfunction    Frozen shoulder    Glaucoma    Heart murmur    History of kidney stones    Hyperlipidemia    Hypertension    Macular degeneration    Neuropathy    Obesity    Sleep apnea    Stroke [facility])    has been told by a Dr. Dwight [name] that she may have had a mini stroke but no further testing was done to confirm   Tachycardia     Assessment: Patient Reported Symptoms:  Cognitive Cognitive Status: Able to follow simple commands, Alert and oriented to person, place, and time, Normal speech and language skills Cognitive/Intellectual Conditions Management [RPT]: None reported or documented in medical history or problem list      Neurological Neurological Review of Symptoms: Dizziness (on and off) Neurological Management Strategies: Adequate rest, Exercise, Medical device  HEENT HEENT Symptoms Reported: Not assessed      Cardiovascular Cardiovascular Symptoms Reported: Other:, Dizziness Other Cardiovascular Symptoms: Patient reports episodes of almost passing out which she attributes to her BP dropping. When she notices this feeling she will stop and rest until it passes. Does patient have uncontrolled Hypertension?: Yes Is patient checking Blood Pressure at home?: Yes Patient's Recent BP reading at home: 118/57 at time of assessment Cardiovascular Management Strategies: Medical device, Medication therapy, Routine screening, Exercise, Weight management Do You Have a Working Readable Scale?:  Yes Weight: 242 lb (109.8 kg) (Patient reported)  Respiratory Respiratory Symptoms Reported: Not assesed    Endocrine Endocrine Symptoms Reported: No symptoms reported Is patient diabetic?: Yes Is patient checking blood sugars at home?: Yes List most recent blood sugar readings, include date and time of day: FreeStyle Libre, 123 at time of assessment Endocrine Comment: Patient reports she has been set up with her new insulin  pump.  Gastrointestinal Gastrointestinal Symptoms Reported: Not assessed      Genitourinary Genitourinary Symptoms Reported: Not assessed    Integumentary Integumentary Symptoms Reported: Not assessed    Musculoskeletal Musculoskelatal Symptoms Reviewed: Joint pain Additional Musculoskeletal Details: Patient notes that she has an appointment 06/19/24 with ortho to evaluate L knee. She was previously getting injections in this knee but reports it has been years since her last one. Patient reports she will have transportation to this appointment. Musculoskeletal Management Strategies: Medical device, Exercise, Adequate rest Falls in the past year?: Yes Number of falls in past year: 2 or more Was there an injury with Fall?: No Fall Risk Category Calculator: 2 Patient Fall Risk Level: Moderate Fall Risk Patient at Risk for Falls Due to: History of fall(s) Fall risk Follow up: Falls evaluation completed, Education provided, Falls prevention discussed  Psychosocial Psychosocial Symptoms Reported: Not assessed            03/30/2024   10:41 AM  Depression screen PHQ 2/9  Decreased Interest 0  Down, Depressed, Hopeless 0  PHQ - 2 Score 0    There were no vitals filed for this visit.  Medications Reviewed Today  Reviewed by Arno Rosaline SQUIBB, RN (Registered Nurse) on 05/25/24 at 1135  Med List Status: <None>   Medication Order Taking? Sig Documenting Provider Last Dose Status Informant  Cholecalciferol  (VITAMIN D3) 50 MCG (2000 UT) capsule [id number] No  Take 2,000 Units by mouth daily. [provider] Taking Active Self  Coenzyme Q10 (CO Q-10) 100 MG CAPS [id number] No Take 100 mg by mouth at bedtime.  [provider] Taking Active Self  dorzolamidel-timolol  (COSOPT ) 22.3-6.8 MG/ML SOLN ophthalmic solution [id number] No Place 1 drop into both eyes 2 (two) times daily. [provider] Taking Active Self  furosemide  (LASIX ) 20 MG tablet [id number] No Take 1 tablet (20 mg total) by mouth daily. Jeffrie Oneil BROCKS, MD Taking Active   Garlic (GARLIQUE PO) [id number] No Take by mouth. [provider] Taking Active   ibuprofen  (ADVIL ) 600 MG tablet [id number] No Take 1 tablet (600 mg total) by mouth every 6 (six) hours as needed. Rosalva Sawyer, MD Taking Active Self  insulin  lispro (HUMALOG ) 100 UNIT/ML injection [id number] No Insulin  pump  Patient taking differently: Inject 3 Units into the skin continuous. Insulin  pump. Maximum daily dose - 150 units   Medina-Vargas, Monina C, NP Taking Active Self  Iron-Vitamin C 65-125 MG TABS [id number] No Take 1 tablet by mouth at bedtime.  [provider] Taking Active Self  melatonin 5 MG TABS [id number] No Take 5 mg by mouth at bedtime.  Patient not taking: Reported on 11/24/2023   [provider] Not Taking Active Self  metFORMIN  (GLUCOPHAGE -XR) 500 MG 24 hr tablet [id number] No Take 1 tablet (500 mg total) by mouth at bedtime.  Patient not taking: Reported on 11/24/2023   Medina-Vargas, Jereld BROCKS, NP Not Taking Active Self  metoprolol  tartrate (LOPRESSOR ) 100 MG tablet [id number] No Take 1 tablet (100 mg total) by mouth 2 (two) times daily. Jeffrie Oneil BROCKS, MD Taking Active   Multiple Vitamin (MULTIVITAMIN WITH MINERALS) TABS tablet [id number] No Take 1 tablet by mouth daily. [provider] Taking Active Self  Multiple Vitamins-Minerals (PRESERVISION AREDS 2 PO) [id number] No Take 1 tablet by mouth 2 (two) times daily. [provider] Taking Active Self   nystatin cream (MYCOSTATIN) [id number] No Apply 1 Application topically 2 (two) times daily. [provider] Taking Active   olmesartan (BENICAR) 40 MG tablet [id number] No Take 40 mg by mouth daily. [provider] Taking Active Self  oxybutynin (DITROPAN-XL) 5 MG 24 hr tablet [id number] No Take 5 mg by mouth at bedtime.  Patient not taking: Reported on 03/30/2024   [provider] Not Taking Active Self  phenazopyridine (PYRIDIUM) 95 MG tablet [id number] No Take 95 mg by mouth 3 (three) times daily as needed for pain.  Patient not taking: Reported on 03/30/2024   [provider] Not Taking Active            Med Note (Takyra Cantrall P   Fri Mar 30, 2024 10:45 AM) Completed course  simvastatin  (ZOCOR ) 40 MG tablet [id number] No Take 1 tablet (40 mg total) by mouth every evening. Medina-Vargas, Monina C, NP Taking Active Self  spironolactone  (ALDACTONE ) 25 MG tablet [id number] No TAKE 1/2 TABLET(12.5MG      TOTAL) DAILY  Patient not taking: Reported on 03/30/2024   Jeffrie Oneil BROCKS, MD Not Taking Active   Travoprost, BAK Free, (TRAVATAN) 0.004 % SOLN ophthalmic solution [id number] No Place 1 drop into both eyes at bedtime.  [provider] Taking Active Self  Recommendation:   Continue Current Plan of Care  Follow Up Plan:   Telephone follow up appointment date/time:  06/22/24 at 10:30 AM  Rosaline Finlay, RN MSN [place]  [facility] RN Care Manager Direct Dial: [phone]  Fax: [phone]

## 2024-05-25 NOTE — Patient Outreach (Signed)
 Care Coordination   05/25/2024 Name: Jessica [name] MRN: [id number] DOB: 05-18-1946   Care Coordination Outreach Attempts:  An unsuccessful outreach was attempted for an appointment today.  Follow Up Plan:  Additional outreach attempts will be made to complete CCM follow-up visit.   Encounter Outcome:  No Answer. HIPAA compliant voicemail left asking for return call.   Rosaline Finlay, RN MSN [place]  [facility] Population Health RN Care Manager Direct Dial: [phone]  Fax: [phone]

## 2024-05-25 NOTE — Patient Instructions (Signed)
 Visit Information  Thank you for taking time to visit with me today. Please don't hesitate to contact me if I can be of assistance to you before our next scheduled appointment.  Your next care management appointment is by telephone on 06/22/24 at 10:30 AM  Please call the care guide team at [phone] if you need to cancel, schedule, or reschedule an appointment.   Please call the Suicide and Crisis Lifeline: 988 call 1-800-273-TALK (toll free, 24 hour hotline) if you are experiencing a Mental Health or Behavioral Health Crisis or need someone to talk to.  Rosaline Finlay, RN MSN [place]  [facility] Population Health RN Care Manager Direct Dial: [phone]  Fax: [phone]

## 2024-06-05 DIAGNOSIS — E7800 Pure hypercholesterolemia, unspecified: Secondary | ICD-10-CM | POA: Diagnosis not present

## 2024-06-05 DIAGNOSIS — E1165 Type 2 diabetes mellitus with hyperglycemia: Secondary | ICD-10-CM | POA: Diagnosis not present

## 2024-06-05 DIAGNOSIS — E669 Obesity, unspecified: Secondary | ICD-10-CM | POA: Diagnosis not present

## 2024-06-05 DIAGNOSIS — I10 Essential (primary) hypertension: Secondary | ICD-10-CM | POA: Diagnosis not present

## 2024-06-05 DIAGNOSIS — Z9641 Presence of insulin pump (external) (internal): Secondary | ICD-10-CM | POA: Diagnosis not present

## 2024-06-05 DIAGNOSIS — G609 Hereditary and idiopathic neuropathy, unspecified: Secondary | ICD-10-CM | POA: Diagnosis not present

## 2024-06-10 DIAGNOSIS — I700 Atherosclerosis of aorta: Secondary | ICD-10-CM | POA: Diagnosis not present

## 2024-06-10 DIAGNOSIS — E1140 Type 2 diabetes mellitus with diabetic neuropathy, unspecified: Secondary | ICD-10-CM | POA: Diagnosis not present

## 2024-06-10 DIAGNOSIS — I10 Essential (primary) hypertension: Secondary | ICD-10-CM | POA: Diagnosis not present

## 2024-06-10 DIAGNOSIS — E1165 Type 2 diabetes mellitus with hyperglycemia: Secondary | ICD-10-CM | POA: Diagnosis not present

## 2024-06-22 ENCOUNTER — Other Ambulatory Visit: Payer: Self-pay

## 2024-06-22 NOTE — Patient Outreach (Signed)
 Complex Care Management   Visit Note  06/22/2024  Name:  Jessica [name] MRN: [id number] DOB: 1946/09/18  Situation: Referral received for Complex Care Management related to Diabetes with Complications I obtained verbal consent from Patient.  Visit completed with Patient  on the phone  Background:   Past Medical History:  Diagnosis Date   Anemia    Arthritis    Cataract    Diabetes mellitus without complication (HCC)    Diastolic dysfunction    Frozen shoulder    Glaucoma    Heart murmur    History of kidney stones    Hyperlipidemia    Hypertension    Macular degeneration    Neuropathy    Obesity    Sleep apnea    Stroke [facility])    has been told by a Dr. Dwight Economy that she may have had a mini stroke but no further testing was done to confirm   Tachycardia     Assessment: Patient Reported Symptoms:  Cognitive Cognitive Status: Able to follow simple commands, Alert and oriented to person, place, and time, Normal speech and language skills Cognitive/Intellectual Conditions Management [RPT]: None reported or documented in medical history or problem list      Neurological Neurological Review of Symptoms: No symptoms reported    HEENT HEENT Symptoms Reported: No symptoms reported      Cardiovascular Cardiovascular Symptoms Reported: No symptoms reported Other Cardiovascular Symptoms: Patient reports on further episodes of dizziness or feeling like she is going to pass out. Does patient have uncontrolled Hypertension?: Yes Is patient checking Blood Pressure at home?: Yes Patient's Recent BP reading at home: 120/55 yesterday Cardiovascular Management Strategies: Medical device, Medication therapy, Routine screening, Exercise, Weight management Do You Have a Working Readable Scale?: Yes Weight: 241 lb 12.8 oz (109.7 kg) (Patient reported)  Respiratory Respiratory Symptoms Reported: No symptoms reported Respiratory Management Strategies: CPAP  Endocrine Endocrine Symptoms  Reported: Other Other symptoms related to hypoglycemia or hyperglycemia: Patient reports occasional episodes of nighttime hypoglycemia. She reports her monitor will alert her. She does eat a bedtime snack and keeps a snack at her bedside in the event of hypoglycemia. Is patient diabetic?: Yes Is patient checking blood sugars at home?: Yes List most recent blood sugar readings, include date and time of day: [employer] Guardian 4 that communicates with her insulin  pump, 80 at time of assessment Endocrine Comment: Patient notes that she is 100% in range in the last 24 hours per glucose monitor  Gastrointestinal Gastrointestinal Symptoms Reported: No symptoms reported Gastrointestinal Management Strategies: Diet modification, Exercise    Genitourinary Genitourinary Symptoms Reported: No symptoms reported Genitourinary Management Strategies: Incontinence garment/pad (Pad in case of dribbling)  Integumentary Integumentary Symptoms Reported: No symptoms reported    Musculoskeletal Musculoskelatal Symptoms Reviewed: Joint pain Additional Musculoskeletal Details: Patient notes she cancelled her orthopedic appointment 06/19/24 because she cannot get an x-ray with her Guardian 4 glucose monitor. She states I just need to figure out how I'm going to get it x-ray'ed before I schedule that. Musculoskeletal Management Strategies: Medical device, Exercise, Adequate rest (Rollator) Falls in the past year?: Yes Number of falls in past year: 2 or more (No falls since previous CMRN visit) Was there an injury with Fall?: No Fall Risk Category Calculator: 2 Patient Fall Risk Level: Moderate Fall Risk Patient at Risk for Falls Due to: History of fall(s) Fall risk Follow up: Falls evaluation completed, Education provided  Psychosocial Psychosocial Symptoms Reported: No symptoms reported  06/22/2024    PHQ2-9 Depression Screening   Little interest or pleasure in doing things    Feeling down, depressed,  or hopeless    PHQ-2 - Total Score    Trouble falling or staying asleep, or sleeping too much    Feeling tired or having little energy    Poor appetite or overeating     Feeling bad about yourself - or that you are a failure or have let yourself or your family down    Trouble concentrating on things, such as reading the newspaper or watching television    Moving or speaking so slowly that other people could have noticed.  Or the opposite - being so fidgety or restless that you have been moving around a lot more than usual    Thoughts that you would be better off dead, or hurting yourself in some way    PHQ2-9 Total Score    If you checked off any problems, how difficult have these problems made it for you to do your work, take care of things at home, or get along with other people    Depression Interventions/Treatment      There were no vitals filed for this visit.  Medications Reviewed Today     Reviewed by Arno Rosaline SQUIBB, RN (Registered Nurse) on 06/22/24 at 1043  Med List Status: <None>   Medication Order Taking? Sig Documenting Provider Last Dose Status Informant  Cholecalciferol  (VITAMIN D3) 50 MCG (2000 UT) capsule [id number] No Take 2,000 Units by mouth daily. [provider] Taking Active Self  Coenzyme Q10 (CO Q-10) 100 MG CAPS [id number] No Take 100 mg by mouth at bedtime.  [provider] Taking Active Self  dorzolamidel-timolol  (COSOPT ) 22.3-6.8 MG/ML SOLN ophthalmic solution [id number] No Place 1 drop into both eyes 2 (two) times daily. [provider] Taking Active Self  furosemide  (LASIX ) 20 MG tablet [id number] No Take 1 tablet (20 mg total) by mouth daily. Jeffrie Oneil BROCKS, MD Taking Active   Garlic (GARLIQUE PO) [id number] No Take by mouth. [provider] Taking Active   ibuprofen  (ADVIL ) 600 MG tablet [id number] No Take 1 tablet (600 mg total) by mouth every 6 (six) hours as needed. Rosalva Sawyer, MD Taking Active Self  insulin  lispro  (HUMALOG ) 100 UNIT/ML injection [id number] No Insulin  pump  Patient taking differently: Inject 3 Units into the skin continuous. Insulin  pump. Maximum daily dose - 150 units   Medina-Vargas, Monina C, NP Taking Active Self  Iron-Vitamin C 65-125 MG TABS [id number] No Take 1 tablet by mouth at bedtime.  [provider] Taking Active Self  melatonin 5 MG TABS [id number] No Take 5 mg by mouth at bedtime.  Patient not taking: Reported on 11/24/2023   [provider] Not Taking Active Self  metFORMIN  (GLUCOPHAGE -XR) 500 MG 24 hr tablet [id number] No Take 1 tablet (500 mg total) by mouth at bedtime.  Patient not taking: Reported on 11/24/2023   Medina-Vargas, Jereld BROCKS, NP Not Taking Active Self  metoprolol  tartrate (LOPRESSOR ) 100 MG tablet [id number] No Take 1 tablet (100 mg total) by mouth 2 (two) times daily. Jeffrie Oneil BROCKS, MD Taking Active   Multiple Vitamin (MULTIVITAMIN WITH MINERALS) TABS tablet [id number] No Take 1 tablet by mouth daily. [provider] Taking Active Self  Multiple Vitamins-Minerals (PRESERVISION AREDS 2 PO) [id number] No Take 1 tablet by mouth 2 (two) times daily. [provider] Taking Active Self  nystatin cream (MYCOSTATIN) [id number] No Apply 1 Application topically 2 (two)  times daily. [provider] Taking Active   olmesartan (BENICAR) 40 MG tablet [id number] No Take 40 mg by mouth daily. [provider] Taking Active Self  oxybutynin (DITROPAN-XL) 5 MG 24 hr tablet [id number] No Take 5 mg by mouth at bedtime.  Patient not taking: Reported on 03/30/2024   [provider] Not Taking Active Self  phenazopyridine (PYRIDIUM) 95 MG tablet [id number] No Take 95 mg by mouth 3 (three) times daily as needed for pain.  Patient not taking: Reported on 03/30/2024   [provider] Not Taking Active            Med Note (Kymberly Blomberg P   Fri Mar 30, 2024 10:45 AM) Completed course  simvastatin  (ZOCOR ) 40 MG tablet  [id number] No Take 1 tablet (40 mg total) by mouth every evening. Medina-Vargas, Monina C, NP Taking Active Self  spironolactone  (ALDACTONE ) 25 MG tablet [id number] No TAKE 1/2 TABLET(12.5MG      TOTAL) DAILY  Patient not taking: Reported on 03/30/2024   Jeffrie Oneil BROCKS, MD Not Taking Active   Travoprost, BAK Free, (TRAVATAN) 0.004 % SOLN ophthalmic solution [id number] No Place 1 drop into both eyes at bedtime.  [provider] Taking Active Self            Recommendation:   Continue Current Plan of Care  Follow Up Plan:   Closing From:  Complex Care Management Patient has met all care management goals. Care Management case will be closed. Patient has been provided contact information should new needs arise.   Rosaline Finlay, RN MSN [place]  [facility] Population Health RN Care Manager Direct Dial: [phone]  Fax: [phone]

## 2024-06-22 NOTE — Patient Instructions (Addendum)
 Visit Information  Thank you for taking time to visit with me today. Please don't hesitate to contact me if I can be of assistance to you before our next scheduled appointment.  I have sent you resources through your email and below regarding ramp assistance. Please let me know if you have any questions.  Your next care management appointment is no further scheduled appointments.    Closing From: Complex Care Management. Patient has met all care management goals. Care Management case will be closed. Patient has been provided contact information should new needs arise.   Please call the care guide team at [phone] if you need to cancel, schedule, or reschedule an appointment.   Please call the Suicide and Crisis Lifeline: 988 call 1-800-273-TALK (toll free, 24 hour hotline) if you are experiencing a Mental Health or Behavioral Health Crisis or need someone to talk to.  Rosaline Finlay, RN MSN [place]  [facility]BC[facility] Population Health RN Care Manager Direct Dial: [phone]  Fax: [phone]  Ramp Resources [employer]: they accept Medicare to help cover costs [employer]: must have limited means to accomplish repairs on your own and have total house income at or below 80% [facility] median income [employer]: church volunteers who help those who are 98+ years old Please visit [employer] through your MyChart app for additional ramp resources

## 2024-06-24 DIAGNOSIS — E1165 Type 2 diabetes mellitus with hyperglycemia: Secondary | ICD-10-CM | POA: Diagnosis not present

## 2024-06-24 DIAGNOSIS — I700 Atherosclerosis of aorta: Secondary | ICD-10-CM | POA: Diagnosis not present

## 2024-06-24 DIAGNOSIS — E782 Mixed hyperlipidemia: Secondary | ICD-10-CM | POA: Diagnosis not present

## 2024-06-24 DIAGNOSIS — E1140 Type 2 diabetes mellitus with diabetic neuropathy, unspecified: Secondary | ICD-10-CM | POA: Diagnosis not present

## 2024-06-24 DIAGNOSIS — I10 Essential (primary) hypertension: Secondary | ICD-10-CM | POA: Diagnosis not present

## 2024-06-24 DIAGNOSIS — H4089 Other specified glaucoma: Secondary | ICD-10-CM | POA: Diagnosis not present

## 2024-07-03 DIAGNOSIS — Z1231 Encounter for screening mammogram for malignant neoplasm of breast: Secondary | ICD-10-CM | POA: Diagnosis not present

## 2024-07-03 DIAGNOSIS — Z23 Encounter for immunization: Secondary | ICD-10-CM | POA: Diagnosis not present

## 2024-07-10 DIAGNOSIS — I10 Essential (primary) hypertension: Secondary | ICD-10-CM | POA: Diagnosis not present

## 2024-07-10 DIAGNOSIS — E1140 Type 2 diabetes mellitus with diabetic neuropathy, unspecified: Secondary | ICD-10-CM | POA: Diagnosis not present

## 2024-07-10 DIAGNOSIS — I700 Atherosclerosis of aorta: Secondary | ICD-10-CM | POA: Diagnosis not present

## 2024-07-10 DIAGNOSIS — E1165 Type 2 diabetes mellitus with hyperglycemia: Secondary | ICD-10-CM | POA: Diagnosis not present

## 2024-07-24 DIAGNOSIS — E1140 Type 2 diabetes mellitus with diabetic neuropathy, unspecified: Secondary | ICD-10-CM | POA: Diagnosis not present

## 2024-07-24 DIAGNOSIS — E782 Mixed hyperlipidemia: Secondary | ICD-10-CM | POA: Diagnosis not present

## 2024-07-24 DIAGNOSIS — I10 Essential (primary) hypertension: Secondary | ICD-10-CM | POA: Diagnosis not present

## 2024-07-24 DIAGNOSIS — E1165 Type 2 diabetes mellitus with hyperglycemia: Secondary | ICD-10-CM | POA: Diagnosis not present

## 2024-07-24 DIAGNOSIS — H4089 Other specified glaucoma: Secondary | ICD-10-CM | POA: Diagnosis not present

## 2024-07-24 DIAGNOSIS — I700 Atherosclerosis of aorta: Secondary | ICD-10-CM | POA: Diagnosis not present

## 2024-08-09 DIAGNOSIS — E1165 Type 2 diabetes mellitus with hyperglycemia: Secondary | ICD-10-CM | POA: Diagnosis not present

## 2024-08-09 DIAGNOSIS — I10 Essential (primary) hypertension: Secondary | ICD-10-CM | POA: Diagnosis not present

## 2024-08-09 DIAGNOSIS — I700 Atherosclerosis of aorta: Secondary | ICD-10-CM | POA: Diagnosis not present

## 2024-08-09 DIAGNOSIS — E1140 Type 2 diabetes mellitus with diabetic neuropathy, unspecified: Secondary | ICD-10-CM | POA: Diagnosis not present

## 2024-08-20 DIAGNOSIS — E1165 Type 2 diabetes mellitus with hyperglycemia: Secondary | ICD-10-CM | POA: Diagnosis not present

## 2024-08-20 DIAGNOSIS — Z9641 Presence of insulin pump (external) (internal): Secondary | ICD-10-CM | POA: Diagnosis not present

## 2024-08-20 DIAGNOSIS — E669 Obesity, unspecified: Secondary | ICD-10-CM | POA: Diagnosis not present

## 2024-08-20 DIAGNOSIS — G609 Hereditary and idiopathic neuropathy, unspecified: Secondary | ICD-10-CM | POA: Diagnosis not present

## 2024-08-20 DIAGNOSIS — I10 Essential (primary) hypertension: Secondary | ICD-10-CM | POA: Diagnosis not present

## 2024-08-20 DIAGNOSIS — E7800 Pure hypercholesterolemia, unspecified: Secondary | ICD-10-CM | POA: Diagnosis not present

## 2024-08-24 DIAGNOSIS — E1140 Type 2 diabetes mellitus with diabetic neuropathy, unspecified: Secondary | ICD-10-CM | POA: Diagnosis not present

## 2024-08-24 DIAGNOSIS — I10 Essential (primary) hypertension: Secondary | ICD-10-CM | POA: Diagnosis not present

## 2024-08-24 DIAGNOSIS — I700 Atherosclerosis of aorta: Secondary | ICD-10-CM | POA: Diagnosis not present

## 2024-08-24 DIAGNOSIS — E782 Mixed hyperlipidemia: Secondary | ICD-10-CM | POA: Diagnosis not present

## 2024-08-24 DIAGNOSIS — E1165 Type 2 diabetes mellitus with hyperglycemia: Secondary | ICD-10-CM | POA: Diagnosis not present

## 2024-08-24 DIAGNOSIS — H4089 Other specified glaucoma: Secondary | ICD-10-CM | POA: Diagnosis not present

## 2024-09-03 DIAGNOSIS — E782 Mixed hyperlipidemia: Secondary | ICD-10-CM | POA: Diagnosis not present

## 2024-09-03 DIAGNOSIS — D649 Anemia, unspecified: Secondary | ICD-10-CM | POA: Diagnosis not present

## 2024-09-03 DIAGNOSIS — E1140 Type 2 diabetes mellitus with diabetic neuropathy, unspecified: Secondary | ICD-10-CM | POA: Diagnosis not present

## 2024-09-03 DIAGNOSIS — I10 Essential (primary) hypertension: Secondary | ICD-10-CM | POA: Diagnosis not present

## 2024-09-08 DIAGNOSIS — E1165 Type 2 diabetes mellitus with hyperglycemia: Secondary | ICD-10-CM | POA: Diagnosis not present

## 2024-09-08 DIAGNOSIS — I10 Essential (primary) hypertension: Secondary | ICD-10-CM | POA: Diagnosis not present

## 2024-09-08 DIAGNOSIS — E1140 Type 2 diabetes mellitus with diabetic neuropathy, unspecified: Secondary | ICD-10-CM | POA: Diagnosis not present

## 2024-09-08 DIAGNOSIS — I700 Atherosclerosis of aorta: Secondary | ICD-10-CM | POA: Diagnosis not present

## 2024-09-10 DIAGNOSIS — I10 Essential (primary) hypertension: Secondary | ICD-10-CM | POA: Diagnosis not present

## 2024-09-10 DIAGNOSIS — Z6841 Body Mass Index (BMI) 40.0 and over, adult: Secondary | ICD-10-CM | POA: Diagnosis not present

## 2024-09-10 DIAGNOSIS — R296 Repeated falls: Secondary | ICD-10-CM | POA: Diagnosis not present

## 2024-09-10 DIAGNOSIS — E782 Mixed hyperlipidemia: Secondary | ICD-10-CM | POA: Diagnosis not present

## 2024-09-10 DIAGNOSIS — E113293 Type 2 diabetes mellitus with mild nonproliferative diabetic retinopathy without macular edema, bilateral: Secondary | ICD-10-CM | POA: Diagnosis not present

## 2024-09-10 DIAGNOSIS — D509 Iron deficiency anemia, unspecified: Secondary | ICD-10-CM | POA: Diagnosis not present

## 2024-09-10 DIAGNOSIS — E1129 Type 2 diabetes mellitus with other diabetic kidney complication: Secondary | ICD-10-CM | POA: Diagnosis not present

## 2024-09-10 DIAGNOSIS — Z23 Encounter for immunization: Secondary | ICD-10-CM | POA: Diagnosis not present

## 2024-09-10 DIAGNOSIS — Z0000 Encounter for general adult medical examination without abnormal findings: Secondary | ICD-10-CM | POA: Diagnosis not present

## 2024-09-10 DIAGNOSIS — Z1331 Encounter for screening for depression: Secondary | ICD-10-CM | POA: Diagnosis not present

## 2024-09-10 DIAGNOSIS — R809 Proteinuria, unspecified: Secondary | ICD-10-CM | POA: Diagnosis not present

## 2024-09-18 ENCOUNTER — Telehealth: Payer: Self-pay

## 2024-09-18 DIAGNOSIS — Z604 Social exclusion and rejection: Secondary | ICD-10-CM

## 2024-09-23 DIAGNOSIS — I700 Atherosclerosis of aorta: Secondary | ICD-10-CM | POA: Diagnosis not present

## 2024-09-23 DIAGNOSIS — E782 Mixed hyperlipidemia: Secondary | ICD-10-CM | POA: Diagnosis not present

## 2024-09-23 DIAGNOSIS — H4089 Other specified glaucoma: Secondary | ICD-10-CM | POA: Diagnosis not present

## 2024-09-23 DIAGNOSIS — E1165 Type 2 diabetes mellitus with hyperglycemia: Secondary | ICD-10-CM | POA: Diagnosis not present

## 2024-09-23 DIAGNOSIS — I10 Essential (primary) hypertension: Secondary | ICD-10-CM | POA: Diagnosis not present

## 2024-09-23 DIAGNOSIS — E1140 Type 2 diabetes mellitus with diabetic neuropathy, unspecified: Secondary | ICD-10-CM | POA: Diagnosis not present

## 2024-10-04 ENCOUNTER — Telehealth: Payer: Self-pay

## 2024-10-04 NOTE — Progress Notes (Signed)
 Complex Care Management Note  Care Guide Note 10/04/2024 Name: Jessica [name] MRN: [id number] DOB: [birth year]  Jessica [name] is a 78 y.o. year old female who sees Jessica Harvey, MD for primary care. I reached out to Jessica [name] by phone today to offer complex care management services.  Jessica [name] was given information about Complex Care Management services today including:   The Complex Care Management services include support from the care team which includes your Nurse Care Manager, Clinical Social Worker, or Pharmacist.  The Complex Care Management team is here to help remove barriers to the health concerns and goals most important to you. Complex Care Management services are voluntary, and the patient may decline or stop services at any time by request to their care team member.   Complex Care Management Consent Status: Patient agreed to services and verbal consent obtained.   Follow up plan:  Telephone appointment with complex care management team member scheduled for:  10/12/24  Encounter Outcome:  Patient Scheduled  .Jessica [name] [facility], [facility] Guide  Direct Dial: [phone]  Fax [phone]

## 2024-10-05 ENCOUNTER — Encounter: Payer: Self-pay | Admitting: Cardiology

## 2024-10-12 ENCOUNTER — Other Ambulatory Visit: Payer: Self-pay | Admitting: Licensed Clinical Social Worker

## 2024-10-12 NOTE — Patient Instructions (Signed)
 Visit Information  Thank you for taking time to visit with me today. Please don't hesitate to contact me if I can be of assistance to you before our next scheduled appointment.  Our next appointment is by telephone on 10/26/24 at 1pm. Please call the care guide team at [phone] if you need to cancel or reschedule your appointment.   Following is a copy of your care plan:   Goals Addressed             This Visit's Progress    VBCI Social Work Care Plan LCSW       Problems:   Transportation  CSW Clinical Goal(s):   Over the next 60 days the Patient will explore community resource options for unmet needs related to Transportation as evidence by completing intake for future appointment.  Over the next 2 weeks patient will review information about transportation.   Interventions:  Social Determinants of Health: SDOH assessments completed: Transportation Evaluation of current treatment plan related to unmet needs Transportation resources: health plan [employer] Community resources: [facility]  Patient Goals/Self-Care Activities:  Patient well explore community resources for transportation and inclusion.   Plan:   Telephone follow up appointment with care management team member scheduled for:  10/26/2024 at 1pm.        Please call 911 if you are experiencing a Mental Health or Behavioral Health Crisis or need someone to talk to.  Patient verbalized understanding of Care plan and visit instructions communicated this visit  Cena Ligas, LCSW Clinical Social Worker [facility] [employer]

## 2024-10-12 NOTE — Patient Outreach (Signed)
 Complex Care Management   Visit Note  10/12/2024  Name:  Jessica [name] MRN: [id number] DOB: 1946/02/05  Situation: Referral received for Complex Care Management related to SDOH Barriers:  Transportation I obtained verbal consent from Patient.  Visit completed with Patient  on the phone  Background:   Past Medical History:  Diagnosis Date   Anemia    Arthritis    Cataract    Diabetes mellitus without complication (HCC)    Diastolic dysfunction    Frozen shoulder    Glaucoma    Heart murmur    History of kidney stones    Hyperlipidemia    Hypertension    Macular degeneration    Neuropathy    Obesity    Sleep apnea    Stroke [facility])    has been told by a Dr. Dwight Economy that she may have had a mini stroke but no further testing was done to confirm   Tachycardia     Assessment: LCSW spoke with patient on the phone. Patient reports she is doing well. Patient shared that she spends most of her time at home. Patient reports she uses a rollator to ambulate and does have a wheelchair for when she goes outside the home. Patient reports she sometimes needs transportation. LCSW and patient discussed transportation options. LCSW will email to patient. Patient also shared that she is somewhat isolated. LCSW and patient discussed activities at the [facility]. LCSW and patient also discussed hiring a nurse or personal aid that can periodically check in with patient about blood pressure. All information will be emailed to patient.   Patient Reported Symptoms:  Cognitive Cognitive Status: Normal speech and language skills, Alert and oriented to person, place, and time Cognitive/Intellectual Conditions Management [RPT]: None reported or documented in medical history or problem list      Neurological Neurological Review of Symptoms: Dizziness, Headaches Neurological Management Strategies: Routine screening Neurological Self-Management Outcome: 3 (uncertain) Neurological Comment:  Legs fall asleep and become numb- MD aware  HEENT HEENT Symptoms Reported: No symptoms reported      Cardiovascular Cardiovascular Symptoms Reported: Palpitations Does patient have uncontrolled Hypertension?: Yes, No Cardiovascular Management Strategies: Coping strategies, Medication therapy, Routine screening Cardiovascular Self-Management Outcome: 4 (good) Cardiovascular Comment: Pt is under a Dr care and takes medication  Respiratory Respiratory Symptoms Reported: No symptoms reported    Endocrine Is patient diabetic?: Yes Is patient checking blood sugars at home?: Yes List most recent blood sugar readings, include date and time of day: morning- 111 Patient reports it runs between 100- 115 Endocrine Self-Management Outcome: 4 (good)  Gastrointestinal Gastrointestinal Symptoms Reported: Constipation, Diarrhea Additional Gastrointestinal Details: Pt reports      Genitourinary Genitourinary Symptoms Reported: No symptoms reported, Urgency Additional Genitourinary Details: Pt reports occasional leaks    Integumentary Integumentary Symptoms Reported: No symptoms reported    Musculoskeletal Musculoskelatal Symptoms Reviewed: Limited mobility, Difficulty walking Additional Musculoskeletal Details: Pt uses a Rollator and has a wheelchair Musculoskeletal Management Strategies: Coping strategies, Routine screening      Psychosocial Psychosocial Symptoms Reported: No symptoms reported     Quality of Family Relationships: involved Do you feel physically threatened by others?: No    10/12/2024    PHQ2-9 Depression Screening   Little interest or pleasure in doing things Not at all  Feeling down, depressed, or hopeless Not at all  PHQ-2 - Total Score 0  Trouble falling or staying asleep, or sleeping too much    Feeling tired or having little  energy    Poor appetite or overeating     Feeling bad about yourself - or that you are a failure or have let yourself or your family down     Trouble concentrating on things, such as reading the newspaper or watching television    Moving or speaking so slowly that other people could have noticed.  Or the opposite - being so fidgety or restless that you have been moving around a lot more than usual    Thoughts that you would be better off dead, or hurting yourself in some way    PHQ2-9 Total Score    If you checked off any problems, how difficult have these problems made it for you to do your work, take care of things at home, or get along with other people    Depression Interventions/Treatment      There were no vitals filed for this visit.    Medications Reviewed Today     Reviewed by Veva Bolt, LCSW (Social Worker) on 10/12/24 at 1324  Med List Status: <None>   Medication Order Taking? Sig Documenting Provider Last Dose Status Informant  Cholecalciferol  (VITAMIN D3) 50 MCG (2000 UT) capsule [id number] No Take 2,000 Units by mouth daily. [provider] Taking Active Self  Coenzyme Q10 (CO Q-10) 100 MG CAPS [id number] No Take 100 mg by mouth at bedtime.  [provider] Taking Active Self  dorzolamidel-timolol  (COSOPT ) 22.3-6.8 MG/ML SOLN ophthalmic solution [id number] No Place 1 drop into both eyes 2 (two) times daily. [provider] Taking Active Self  furosemide  (LASIX ) 20 MG tablet [id number] No Take 1 tablet (20 mg total) by mouth daily. Jeffrie Oneil BROCKS, MD Taking Active   Garlic (GARLIQUE PO) [id number] No Take by mouth. [provider] Taking Active   ibuprofen  (ADVIL ) 600 MG tablet [id number] No Take 1 tablet (600 mg total) by mouth every 6 (six) hours as needed. Rosalva Sawyer, MD Taking Active Self  insulin  lispro (HUMALOG ) 100 UNIT/ML injection [id number] No Insulin  pump  Patient taking differently: Inject 3 Units into the skin continuous. Insulin  pump. Maximum daily dose - 150 units   Medina-Vargas, Monina C, NP Taking Active Self  Iron-Vitamin C 65-125 MG TABS [id number] No Take 1 tablet  by mouth at bedtime.  [provider] Taking Active Self  melatonin 5 MG TABS [id number] No Take 5 mg by mouth at bedtime.  Patient not taking: Reported on 11/24/2023   [provider] Not Taking Active Self  metFORMIN  (GLUCOPHAGE -XR) 500 MG 24 hr tablet [id number] No Take 1 tablet (500 mg total) by mouth at bedtime.  Patient not taking: Reported on 11/24/2023   Medina-Vargas, Jereld BROCKS, NP Not Taking Active Self  metoprolol  tartrate (LOPRESSOR ) 100 MG tablet [id number] No Take 1 tablet (100 mg total) by mouth 2 (two) times daily. Jeffrie Oneil BROCKS, MD Taking Active   Multiple Vitamin (MULTIVITAMIN WITH MINERALS) TABS tablet [id number] No Take 1 tablet by mouth daily. [provider] Taking Active Self  Multiple Vitamins-Minerals (PRESERVISION AREDS 2 PO) [id number] No Take 1 tablet by mouth 2 (two) times daily. [provider] Taking Active Self  nystatin cream (MYCOSTATIN) [id number] No Apply 1 Application topically 2 (two) times daily. [provider] Taking Active   olmesartan (BENICAR) 40 MG tablet [id number] No Take 40 mg by mouth daily. [provider] Taking Active Self  oxybutynin (DITROPAN-XL) 5 MG 24 hr tablet [id number] No Take 5 mg by mouth at bedtime.  Patient not taking: Reported  on 03/30/2024   [provider] Not Taking Active Self  phenazopyridine (PYRIDIUM) 95 MG tablet [id number] No Take 95 mg by mouth 3 (three) times daily as needed for pain.  Patient not taking: Reported on 03/30/2024   [provider] Not Taking Active            Med Note (STEFFENS, MICHELLE P   Fri Mar 30, 2024 10:45 AM) Completed course  simvastatin  (ZOCOR ) 40 MG tablet [id number] No Take 1 tablet (40 mg total) by mouth every evening. Medina-Vargas, Monina C, NP Taking Active Self  spironolactone  (ALDACTONE ) 25 MG tablet [id number] No TAKE 1/2 TABLET(12.5MG      TOTAL) DAILY  Patient not taking: Reported on 03/30/2024   Jeffrie Oneil BROCKS, MD Not Taking Active    Travoprost, BAK Free, (TRAVATAN) 0.004 % SOLN ophthalmic solution [id number] No Place 1 drop into both eyes at bedtime.  [provider] Taking Active Self            Recommendation:   PCP Follow-up Continue Current Plan of Care  Follow Up Plan:   Telephone follow up appointment date/time:  10/26/24 at 1pm  Cena Ligas, LCSW Clinical Social Worker [facility] Population Health

## 2024-10-26 ENCOUNTER — Encounter: Payer: Self-pay | Admitting: Licensed Clinical Social Worker

## 2024-10-26 ENCOUNTER — Telehealth: Payer: Self-pay | Admitting: Licensed Clinical Social Worker

## 2024-10-26 NOTE — Patient Instructions (Signed)
 Jessica [name] - I am sorry I was unable to reach you today for our scheduled appointment. I work with Aisha Harvey, MD and am calling to support your healthcare needs. Please contact me at [phone] at your earliest convenience. I look forward to speaking with you soon.   Thank you,  Cena Ligas, LCSW Clinical Social Worker [facility] Population Health

## 2024-10-31 ENCOUNTER — Emergency Department (HOSPITAL_COMMUNITY)

## 2024-10-31 ENCOUNTER — Emergency Department (HOSPITAL_COMMUNITY)
Admission: EM | Admit: 2024-10-31 | Discharge: 2024-11-02 | Disposition: A | Discharge status: Home / Self Care | Attending: Emergency Medicine | Admitting: Emergency Medicine

## 2024-10-31 ENCOUNTER — Encounter (HOSPITAL_COMMUNITY): Payer: Self-pay | Admitting: Emergency Medicine

## 2024-10-31 ENCOUNTER — Other Ambulatory Visit: Payer: Self-pay

## 2024-10-31 DIAGNOSIS — Z87442 Personal history of urinary calculi: Secondary | ICD-10-CM | POA: Insufficient documentation

## 2024-10-31 DIAGNOSIS — Z79899 Other long term (current) drug therapy: Secondary | ICD-10-CM | POA: Insufficient documentation

## 2024-10-31 DIAGNOSIS — W19XXXA Unspecified fall, initial encounter: Secondary | ICD-10-CM

## 2024-10-31 DIAGNOSIS — D72829 Elevated white blood cell count, unspecified: Secondary | ICD-10-CM | POA: Insufficient documentation

## 2024-10-31 DIAGNOSIS — Z7901 Long term (current) use of anticoagulants: Secondary | ICD-10-CM | POA: Insufficient documentation

## 2024-10-31 DIAGNOSIS — E119 Type 2 diabetes mellitus without complications: Secondary | ICD-10-CM | POA: Insufficient documentation

## 2024-10-31 DIAGNOSIS — W06XXXA Fall from bed, initial encounter: Secondary | ICD-10-CM | POA: Insufficient documentation

## 2024-10-31 DIAGNOSIS — Z794 Long term (current) use of insulin: Secondary | ICD-10-CM | POA: Insufficient documentation

## 2024-10-31 DIAGNOSIS — I517 Cardiomegaly: Secondary | ICD-10-CM | POA: Insufficient documentation

## 2024-10-31 DIAGNOSIS — I10 Essential (primary) hypertension: Secondary | ICD-10-CM | POA: Insufficient documentation

## 2024-10-31 DIAGNOSIS — R531 Weakness: Secondary | ICD-10-CM | POA: Insufficient documentation

## 2024-10-31 LAB — COMPREHENSIVE METABOLIC PANEL WITH GFR
ALT: 23 U/L (ref 0–44)
AST: 29 U/L (ref 15–41)
Albumin: 4 g/dL (ref 3.5–5.0)
Alkaline Phosphatase: 64 U/L (ref 38–126)
Anion gap: 12 (ref 5–15)
BUN: 28 mg/dL — ABNORMAL HIGH (ref 8–23)
CO2: 24 mmol/L (ref 22–32)
Calcium: 9.2 mg/dL (ref 8.9–10.3)
Chloride: 102 mmol/L (ref 98–111)
Creatinine, Ser: 1.1 mg/dL — ABNORMAL HIGH (ref 0.44–1.00)
GFR, Estimated: 52 mL/min — ABNORMAL LOW
Glucose, Bld: 76 mg/dL (ref 70–99)
Potassium: 4.5 mmol/L (ref 3.5–5.1)
Sodium: 138 mmol/L (ref 135–145)
Total Bilirubin: 0.5 mg/dL (ref 0.0–1.2)
Total Protein: 7.2 g/dL (ref 6.5–8.1)

## 2024-10-31 LAB — CBC WITH DIFFERENTIAL/PLATELET
Abs Immature Granulocytes: 0.06 K/uL (ref 0.00–0.07)
Basophils Absolute: 0.1 K/uL (ref 0.0–0.1)
Basophils Relative: 0 %
Eosinophils Absolute: 0 K/uL (ref 0.0–0.5)
Eosinophils Relative: 0 %
HCT: 36.9 % (ref 36.0–46.0)
Hemoglobin: 12 g/dL (ref 12.0–15.0)
Immature Granulocytes: 0 %
Lymphocytes Relative: 12 %
Lymphs Abs: 1.7 K/uL (ref 0.7–4.0)
MCH: 28 pg (ref 26.0–34.0)
MCHC: 32.5 g/dL (ref 30.0–36.0)
MCV: 86.2 fL (ref 80.0–100.0)
Monocytes Absolute: 1.3 K/uL — ABNORMAL HIGH (ref 0.1–1.0)
Monocytes Relative: 9 %
Neutro Abs: 10.8 K/uL — ABNORMAL HIGH (ref 1.7–7.7)
Neutrophils Relative %: 79 %
Platelets: 194 K/uL (ref 150–400)
RBC: 4.28 MIL/uL (ref 3.87–5.11)
RDW: 15.1 % (ref 11.5–15.5)
WBC: 13.9 K/uL — ABNORMAL HIGH (ref 4.0–10.5)
nRBC: 0 % (ref 0.0–0.2)

## 2024-10-31 LAB — PRO BRAIN NATRIURETIC PEPTIDE: Pro Brain Natriuretic Peptide: 234 pg/mL

## 2024-10-31 NOTE — ED Triage Notes (Signed)
 BIB [facility] from home for slip out of wheelchair today and fell onto buttocks, pt also c/o generalized weakness.   BP 151/60 HR 76  Spo2 98%

## 2024-10-31 NOTE — ED Provider Triage Note (Signed)
 Emergency Medicine Provider Triage Evaluation Note  Jessica [name] , a 79 y.o. female  was evaluated in triage.  Pt complains of fall in which the patient slipped out of her wheelchair onto her buttocks.  Generalized weakness.  Patient is noncompliant with anticoagulation and Lasix   Review of Systems  Positive: Minor fall, generalized weakness Negative: Fever, chills, nausea, vomiting  Physical Exam  BP (!) 124/52 (BP Location: Right Arm)   Pulse 78   Temp 99.9 F (37.7 C)   Resp 16   Ht 4' 11 (1.499 m)   Wt 109.7 kg   LMP  (LMP Unknown)   SpO2 97%   BMI 48.85 kg/m  Gen:   Awake, no distress   Resp:  Normal effort  MSK:   Moves extremities without difficulty, edematous Other:    Medical Decision Making  Medically screening exam initiated at 9:11 PM.  Appropriate orders placed.  Jessica [name] was informed that the remainder of the evaluation will be completed by another provider, this initial triage assessment does not replace that evaluation, and the importance of remaining in the ED until their evaluation is complete.  Labs and imaging ordered   Jessica [name] 10/31/24 2112

## 2024-10-31 NOTE — ED Provider Notes (Signed)
 " [facility] Provider Note   CSN: [id number] Arrival date & time: 10/31/24  2039     Patient presents with: Fall and Generalized weakness   Jessica [name] is a 79 y.o. female with history of diabetes, glaucoma, kidney stones, hypertension, macular degeneration, sleep apnea.  Presents to ED complaining of generalized weakness, fall.  States that today she was at her home in her usual state of health.  States that she became weak and fell off of her bed, slid down onto the floor onto her buttocks.  She denies hitting her head, losing consciousness.  She denies any back pain.  She arrives complaining of generalized weakness but states that she has dealt with generalized weakness since 2020.  She reports she typically uses a roller but does have a wheelchair at home.  She reports that she has not received training on this wheelchair so she has not used it.  She states that she has a wheelchair at home because of her generalized weakness which she has been dealing with since 2020.  Denies any new features today for this.  Reports on Saturday she had about 1 hour episode of chest tightness without shortness of breath but this resolved.  No recurrence of this since.  No nausea vomiting, fevers, abdominal pain.  No back pain.  Reports that she does take anticoagulation but reports that she did not hit her head and states that she has not taken her anticoagulation today.   Fall       Prior to Admission medications  Medication Sig Start Date End Date Taking? Authorizing Provider  Cholecalciferol  (VITAMIN D3) 50 MCG (2000 UT) capsule Take 2,000 Units by mouth daily.    [provider]  Coenzyme Q10 (CO Q-10) 100 MG CAPS Take 100 mg by mouth at bedtime.     [provider]  dorzolamidel-timolol  (COSOPT ) 22.3-6.8 MG/ML SOLN ophthalmic solution Place 1 drop into both eyes 2 (two) times daily.    [provider]  furosemide  (LASIX )  20 MG tablet Take 1 tablet (20 mg total) by mouth daily. 11/28/23   Jeffrie Oneil BROCKS, MD  Garlic (GARLIQUE PO) Take by mouth.    [provider]  ibuprofen  (ADVIL ) 600 MG tablet Take 1 tablet (600 mg total) by mouth every 6 (six) hours as needed. 05/13/21   Rosalva Sawyer, MD  insulin  lispro (HUMALOG ) 100 UNIT/ML injection Insulin  pump Patient taking differently: Inject 3 Units into the skin continuous. Insulin  pump. Maximum daily dose - 150 units 07/26/19   Medina-Vargas, Monina C, NP  Iron-Vitamin C 65-125 MG TABS Take 1 tablet by mouth at bedtime.     [provider]  melatonin 5 MG TABS Take 5 mg by mouth at bedtime. Patient not taking: Reported on 11/24/2023    [provider]  metFORMIN  (GLUCOPHAGE -XR) 500 MG 24 hr tablet Take 1 tablet (500 mg total) by mouth at bedtime. Patient not taking: Reported on 11/24/2023 07/26/19   Medina-Vargas, Monina C, NP  metoprolol  tartrate (LOPRESSOR ) 100 MG tablet Take 1 tablet (100 mg total) by mouth 2 (two) times daily. 11/29/23   Jeffrie Oneil BROCKS, MD  Multiple Vitamin (MULTIVITAMIN WITH MINERALS) TABS tablet Take 1 tablet by mouth daily.    [provider]  Multiple Vitamins-Minerals (PRESERVISION AREDS 2 PO) Take 1 tablet by mouth 2 (two) times daily.    [provider]  nystatin cream (MYCOSTATIN) Apply 1 Application topically 2 (two) times daily.  [provider]  olmesartan (BENICAR) 40 MG tablet Take 40 mg by mouth daily. 02/09/21   [provider]  oxybutynin (DITROPAN-XL) 5 MG 24 hr tablet Take 5 mg by mouth at bedtime. Patient not taking: Reported on 03/30/2024    [provider]  phenazopyridine (PYRIDIUM) 95 MG tablet Take 95 mg by mouth 3 (three) times daily as needed for pain. Patient not taking: Reported on 03/30/2024    [provider]  simvastatin  (ZOCOR ) 40 MG tablet Take 1 tablet (40 mg total) by mouth every evening. 07/26/19   Medina-Vargas, Monina C, NP  spironolactone   (ALDACTONE ) 25 MG tablet TAKE 1/2 TABLET(12.5MG      TOTAL) DAILY Patient not taking: Reported on 03/30/2024 06/22/23   Jeffrie Oneil BROCKS, MD  Travoprost, BAK Free, (TRAVATAN) 0.004 % SOLN ophthalmic solution Place 1 drop into both eyes at bedtime.     [provider]    Allergies: Meloxicam, Percocet [oxycodone-acetaminophen ], Tramadol, Vicodin [hydrocodone -acetaminophen ], Azithromycin, Iodine, and Shellfish allergy    Review of Systems  Neurological:  Positive for weakness.  All other systems reviewed and are negative.   Updated Vital Signs BP 105/74 (BP Location: Left Arm)   Pulse 87   Temp 100.1 F (37.8 C) (Oral)   Resp (!) 22   Ht 4' 11 (1.499 m)   Wt 109.7 kg   LMP  (LMP Unknown)   SpO2 95%   BMI 48.85 kg/m   Physical Exam Vitals and nursing note reviewed.  Constitutional:      General: She is not in acute distress.    Appearance: She is well-developed.  HENT:     Head: Normocephalic and atraumatic.  Eyes:     Conjunctiva/sclera: Conjunctivae normal.  Cardiovascular:     Rate and Rhythm: Normal rate and regular rhythm.     Heart sounds: No murmur heard. Pulmonary:     Effort: Pulmonary effort is normal. No respiratory distress.     Breath sounds: Normal breath sounds.  Abdominal:     Palpations: Abdomen is soft.     Tenderness: There is no abdominal tenderness.  Musculoskeletal:        General: No swelling.     Cervical back: Neck supple.     Right lower leg: No edema.     Left lower leg: No edema.     Comments: No tenderness of cervical spine, thoracic spine, lumbar spine.  No step-off or crepitus.  Skin:    General: Skin is warm and dry.     Capillary Refill: Capillary refill takes less than 2 seconds.  Neurological:     Mental Status: She is alert and oriented to person, place, and time. Mental status is at baseline.     GCS: GCS eye subscore is 4. GCS verbal subscore is 5. GCS motor subscore is 6.     Cranial Nerves: Cranial nerves 2-12 are intact.  No cranial nerve deficit.     Sensory: Sensation is intact. No sensory deficit.     Motor: No weakness.     Coordination: Coordination is intact. Heel to [facility] Test normal.     Comments: CN III through XII are intact.  Intact finger-to-nose, heel-to-shin.  No pronator drift.  No slurred speech.  Equal strength throughout.  Equal sensation throughout.  PERRL.  Tracks past midline.  Psychiatric:        Mood and Affect: Mood normal.     (all labs ordered are listed, but only abnormal results are displayed) Labs Reviewed  COMPREHENSIVE METABOLIC PANEL WITH GFR - Abnormal; Notable for the following components:      Result Value   BUN 28 (*)    Creatinine, Ser 1.10 (*)    GFR, Estimated 52 (*)    All other components within normal limits  CBC WITH DIFFERENTIAL/PLATELET - Abnormal; Notable for the following components:   WBC 13.9 (*)    Neutro Abs 10.8 (*)    Monocytes Absolute 1.3 (*)    All other components within normal limits  CBG MONITORING, ED - Abnormal; Notable for the following components:   Glucose-Capillary 54 (*)    All other components within normal limits  CBG MONITORING, ED - Abnormal; Notable for the following components:   Glucose-Capillary 65 (*)    All other components within normal limits  TROPONIN T, HIGH SENSITIVITY - Abnormal; Notable for the following components:   Troponin T High Sensitivity 20 (*)    All other components within normal limits  TROPONIN T, HIGH SENSITIVITY - Abnormal; Notable for the following components:   Troponin T High Sensitivity 20 (*)    All other components within normal limits  RESP PANEL BY RT-PCR (RSV, FLU A&B, COVID)  RVPGX2  PRO BRAIN NATRIURETIC PEPTIDE  URINALYSIS, ROUTINE W REFLEX MICROSCOPIC  CBG MONITORING, ED    EKG: EKG Interpretation Date/Time:  Wednesday October 31 2024 21:36:23 EST Ventricular Rate:  81 PR Interval:  146 QRS Duration:  92 QT Interval:  366 QTC Calculation: 425 R Axis:   20  Text  Interpretation: Normal sinus rhythm with sinus arrhythmia Nonspecific ST and T wave abnormality Abnormal ECG No significant change since last tracing Confirmed by Emil Share [phone]) on 11/01/2024 3:38:52 AM  Radiology: [facility] Chest 2 View Result Date: 10/31/2024 CLINICAL DATA:  Weakness EXAM: DG CHEST 2V COMPARISON:  08/13/2020 FINDINGS: Mild cardiomegaly. No acute airspace disease, pleural effusion or pneumothorax. Multilevel degenerative osteophytes IMPRESSION: No active cardiopulmonary disease. Mild cardiomegaly. Electronically Signed   By: Luke Bun M.D.   On: 10/31/2024 21:53    Procedures   Medications Ordered in the ED  acetaminophen  (TYLENOL ) tablet 650 mg (650 mg Oral Given 11/01/24 0526)  sodium chloride  0.9 % bolus 500 mL (500 mLs Intravenous New Bag/Given 11/01/24 0528)     Medical Decision Making  This is a 79 year old female who presents to the ED after sliding off of her bed onto her buttocks.  Reports history of the same.  Reports that she has been generally weak since 2020, has recently been given a wheelchair but has not been able to learn how to use this and so continues to use roller.  Patient assessed in triage for labs were ordered to include CBC, CMP, BNP, chest x-ray.  EKG as well.  Once I saw patient evaluated her, I added on troponin x 2 due to patient reporting chest pain on Saturday.  CBC here with leukocytosis 13.9, no anemia.  Metabolic panel with creatinine 1.10, GFR 52.  BUN 28.  Patient creatinine is slightly increased with patient received 500 bag of fluid.  No nausea or vomiting here.  Chest x-ray reveals mild cardiomegaly, BNP was obtained which is not elevated at 234.  EKG nonischemic.  Patient troponins are flat at 20.  EKG is normal sinus rhythm with sinus arrhythmia.  Prior to patient discharge, she did have an elevated temp to 100.1 Fahrenheit.  COVID test obtained, Tylenol  provided.  Viral panel negative.  At this time patient is stable for  discharge.   Final  diagnoses:  Fall, initial encounter  Generalized weakness    ED Discharge Orders     None          Ruthell Lonni FALCON, PA-C 11/01/24 0617    Emil Share, DO 11/01/24 [phone]  "

## 2024-11-01 LAB — CBG MONITORING, ED
Glucose-Capillary: 54 mg/dL — ABNORMAL LOW (ref 70–99)
Glucose-Capillary: 65 mg/dL — ABNORMAL LOW (ref 70–99)
Glucose-Capillary: 77 mg/dL (ref 70–99)
Glucose-Capillary: 131 mg/dL — ABNORMAL HIGH (ref 70–99)
Glucose-Capillary: 79 mg/dL (ref 70–99)

## 2024-11-01 LAB — RESP PANEL BY RT-PCR (RSV, FLU A&B, COVID)  RVPGX2
Influenza A by PCR: NEGATIVE
Influenza B by PCR: NEGATIVE
Resp Syncytial Virus by PCR: NEGATIVE
SARS Coronavirus 2 by RT PCR: NEGATIVE

## 2024-11-01 LAB — TROPONIN T, HIGH SENSITIVITY
Troponin T High Sensitivity: 20 ng/L — ABNORMAL HIGH (ref 0–19)
Troponin T High Sensitivity: 20 ng/L — ABNORMAL HIGH (ref 0–19)

## 2024-11-01 MED ORDER — INSULIN ASPART 100 UNIT/ML IJ SOLN
0.0000 [IU] | Freq: Three times a day (TID) | INTRAMUSCULAR | Status: DC
  Administered 2024-11-01: 2 [IU] via SUBCUTANEOUS
  Filled 2024-11-01: qty 2

## 2024-11-01 MED ORDER — VITAMIN D 25 MCG (1000 UNIT) PO TABS
2000.0000 [IU] | ORAL_TABLET | Freq: Every day | ORAL | Status: DC
  Administered 2024-11-01: 2000 [IU] via ORAL
  Filled 2024-11-01: qty 2

## 2024-11-01 MED ORDER — INSULIN ASPART 100 UNIT/ML IJ SOLN: 0.0000 [IU] | Freq: Every day | INTRAMUSCULAR | Status: DC

## 2024-11-01 MED ORDER — INSULIN PUMP
Freq: Three times a day (TID) | SUBCUTANEOUS | Status: DC
  Filled 2024-11-01: qty 1

## 2024-11-01 MED ORDER — FUROSEMIDE 20 MG PO TABS
20.0000 mg | ORAL_TABLET | Freq: Every day | ORAL | Status: DC
  Administered 2024-11-01: 20 mg via ORAL
  Filled 2024-11-01: qty 1

## 2024-11-01 MED ORDER — METOPROLOL TARTRATE 25 MG PO TABS
100.0000 mg | ORAL_TABLET | Freq: Two times a day (BID) | ORAL | Status: DC
  Administered 2024-11-01: 100 mg via ORAL
  Filled 2024-11-01 (×2): qty 4

## 2024-11-01 MED ORDER — CO Q-10 100 MG PO CAPS: 100.0000 mg | ORAL_CAPSULE | Freq: Every day | ORAL | Status: DC

## 2024-11-01 MED ORDER — ACETAMINOPHEN 325 MG PO TABS
650.0000 mg | ORAL_TABLET | ORAL | Status: AC
  Administered 2024-11-01: 650 mg via ORAL
  Filled 2024-11-01: qty 2

## 2024-11-01 MED ORDER — SODIUM CHLORIDE 0.9 % IV BOLUS
500.0000 mL | Freq: Once | INTRAVENOUS | Status: AC
  Administered 2024-11-01: 500 mL via INTRAVENOUS

## 2024-11-01 MED ORDER — VITAMIN D3 25 MCG (1000 UNIT) PO TABS
2000.0000 [IU] | ORAL_TABLET | Freq: Every day | ORAL | Status: DC
  Filled 2024-11-01: qty 2

## 2024-11-01 MED ORDER — IBUPROFEN 400 MG PO TABS: 600.0000 mg | ORAL_TABLET | Freq: Four times a day (QID) | ORAL | Status: DC | PRN

## 2024-11-01 MED ORDER — SIMVASTATIN 20 MG PO TABS
40.0000 mg | ORAL_TABLET | Freq: Every evening | ORAL | Status: DC
  Administered 2024-11-01: 40 mg via ORAL
  Filled 2024-11-01: qty 2

## 2024-11-01 NOTE — Inpatient Diabetes Management (Signed)
 Inpatient Diabetes Program Recommendations  AACE/ADA: New Consensus Statement on Inpatient Glycemic Control (2015)  Target Ranges:  Prepandial:   less than 140 mg/dL      Peak postprandial:   less than 180 mg/dL (1-2 hours)      Critically ill patients:  140 - 180 mg/dL   Lab Results  Component Value Date   GLUCAP 131 (H) 11/01/2024   HGBA1C 6.9 (H) 04/18/2019    Review of Glycemic Control  Diabetes history: DM2 Outpatient Diabetes medications: Medtronic Guardian 4 with Humalog , FS Libre Current orders for Inpatient glycemic control: Novolog  0-15 TID with meals and 0-5 HS  Endo is Dr Tommas  Pt states last HgbA1C was approx 5.9%. States her insulin  pump is on Automatic.  Hypoglycemia of 64, 65 between 0100 and 0200 this am.  Pump settings: Basal - approx 75 units/24H Bolus - ICR - 1:1  CF - ? Goal - 80-120 mg/dL  Inpatient Diabetes Program Recommendations:    Please order Insulin  Pump Order Set and discontinue all SQ insulin .  Spoke with pt in hallway in ED this afternoon. Pt states her pump has been on since arriving to ED early this am. States her last HgbA1c was approx 5.9%. States she usually does not have hypos. Has poor appetite recently and feels bad all over. Said she will be discharged to [facility] for 3 weeks of inpatient rehab, to get stronger to try and prevent falls.   Asked RN to place insulin  pump order set. Discussed d/cing all SQ insulin  to avoid hypoglycemia. Prefers to keep insulin  pump on while hospitalized.   Continue to follow.  Thank you. Shona Brandy, RD, LDN, CDCES Inpatient Diabetes Coordinator [phone]

## 2024-11-01 NOTE — ED Notes (Signed)
 CBG completed on pt to verified her sugar level since she is on her own insulin  pump from home cbg shows 54, crackers and juice given and up to 65, Groce, PA notified. Order to continue feeding pt gotten. We'll continue to monitor.

## 2024-11-01 NOTE — Evaluation (Signed)
 Physical Therapy Evaluation Patient Details Name: Jessica [name] MRN: [id number] DOB: 1946-10-14 Today's Date: 11/01/2024  History of Present Illness  Pt is a 79 year old woman who presented to [facility] on 1/7 with generalized weakness and slipping off her bed. PMH: DM2, glaucoma, kidney stone, HTN, macular degeneration, sleep apnea.  Clinical Impression  Pt is currently presenting at Min to Mod A for bed mobility, Min A for sit to stand and 25 ft of gait. Pt lives alone and has occasional assistance from a friend and neighbor. Due to pt current functional status, home set up and available assistance at home recommending skilled physical therapy services < 3 hours/day in order to address strength, balance and functional mobility to decrease risk for falls, injury, immobility, skin break down and re-hospitalization.          If plan is discharge home, recommend the following: Assistance with cooking/housework;Help with stairs or ramp for entrance   Can travel by private vehicle   Yes    Equipment Recommendations None recommended by PT     Functional Status Assessment Patient has had a recent decline in their functional status and demonstrates the ability to make significant improvements in function in a reasonable and predictable amount of time.     Precautions / Restrictions Precautions Precautions: Fall Recall of Precautions/Restrictions: Intact Restrictions Weight Bearing Restrictions Per Provider Order: No      Mobility  Bed Mobility Overal bed mobility: Needs Assistance Bed Mobility: Supine to Sit, Sit to Supine     Supine to sit: Min assist Sit to supine: Mod assist   General bed mobility comments: Min A to get trunk to mid line, Mod A to get LE up on EOB due to height of stretcher. Pt reports she has a high bed at home.    Transfers Overall transfer level: Needs assistance Equipment used: Rolling walker (2 wheels) Transfers: Sit to/from Stand Sit to Stand: Min  assist, From elevated surface           General transfer comment: Min A for sit to stand from stretcher for initial momentum to get to standing.    Ambulation/Gait Ambulation/Gait assistance: Min assist Gait Distance (Feet): 25 Feet Assistive device: Rolling walker (2 wheels) Gait Pattern/deviations: Step-through pattern, Step-to pattern, Decreased stance time - right, Shuffle, Trunk flexed Gait velocity: decreased Gait velocity interpretation: <1.31 ft/sec, indicative of household ambulator   General Gait Details: very low floor clearance, short step length, decreased stance time intermittently on the R. Intermittent trunk flexion with Forearm support on RW with verbal cues to place hands on walker with upright posture. Pt reporting suddenly during gait that she was nauseous and needed to sit down. pt then had improved gait to stretcher with reciprocal gait pattern and larger steps with improved floor clearance.      Balance Overall balance assessment: Needs assistance Sitting-balance support: Single extremity supported, Feet unsupported Sitting balance-Leahy Scale: Good     Standing balance support: Bilateral upper extremity supported, During functional activity, Reliant on assistive device for balance Standing balance-Leahy Scale: Poor       Pertinent Vitals/Pain Pain Assessment Pain Assessment: Faces Faces Pain Scale: Hurts little more Pain Location: R knee with pushing up in bed. Pain Descriptors / Indicators: Discomfort, Grimacing Pain Intervention(s): Monitored during session    Home Living Family/patient expects to be discharged to:: Private residence Living Arrangements: Alone Available Help at Discharge: Neighbor;Friend(s);Available PRN/intermittently (has a neighbor who can help some and a friend who can assist  with transportation) Type of Home:  (condo) Home Access: Ramped entrance       Home Layout: One level Home Equipment: Rollator (4  wheels);BSC/3in1;Toilet riser;Grab bars - tub/shower;Shower seat;Wheelchair - manual;Other (comment) Additional Comments: Pt has an adjustable bed with rails at home but it is high and has to use a stool to get onto the bed.    Prior Function Prior Level of Function : Independent/Modified Independent             Mobility Comments: reports she uses rollator at baseline ADLs Comments: Pt reports she is normally ind but gave up driving this year and friend assists with transportation. Pt gets groceries delivered.     Extremity/Trunk Assessment   Upper Extremity Assessment Upper Extremity Assessment: Defer to OT evaluation    Lower Extremity Assessment Lower Extremity Assessment: Generalized weakness    Cervical / Trunk Assessment Cervical / Trunk Assessment: Normal  Communication   Communication Communication: No apparent difficulties    Cognition Arousal: Alert Behavior During Therapy: WFL for tasks assessed/performed   PT - Cognitive impairments: No apparent impairments       Following commands: Intact       Cueing Cueing Techniques: Verbal cues            Assessment/Plan    PT Assessment Patient needs continued PT services  PT Problem List Decreased strength;Decreased balance;Decreased mobility       PT Treatment Interventions DME instruction;Therapeutic activities;Gait training;Therapeutic exercise;Balance training;Functional mobility training;Patient/family education    PT Goals (Current goals can be found in the Care Plan section)  Acute Rehab PT Goals Patient Stated Goal: to improve strength PT Goal Formulation: With patient Time For Goal Achievement: 11/15/24 Potential to Achieve Goals: Good    Frequency Min 2X/week        AM-PAC PT 6 Clicks Mobility  Outcome Measure Help needed turning from your back to your side while in a flat bed without using bedrails?: A Little Help needed moving from lying on your back to sitting on the side of a  flat bed without using bedrails?: A Little Help needed moving to and from a bed to a chair (including a wheelchair)?: A Little Help needed standing up from a chair using your arms (e.g., wheelchair or bedside chair)?: A Little Help needed to walk in hospital room?: A Lot Help needed climbing 3-5 steps with a railing? : Total 6 Click Score: 15    End of Session Equipment Utilized During Treatment: Gait belt Activity Tolerance: Patient tolerated treatment well;Patient limited by fatigue Patient left: in bed;Other (comment) (in hall of ED) Nurse Communication: Mobility status;Other (comment) (pt reports she has Flu A and has not yet notified medical staff. RN was notified.) PT Visit Diagnosis: Unsteadiness on feet (R26.81);Other abnormalities of gait and mobility (R26.89);Muscle weakness (generalized) (M62.81)    Time: 8988-8961 PT Time Calculation (min) (ACUTE ONLY): 27 min   Charges:   PT Evaluation $PT Eval Low Complexity: 1 Low PT Treatments $Therapeutic Activity: 8-22 mins PT General Charges $$ ACUTE PT VISIT: 1 Visit         Dorothyann Maier, DPT, CLT  Acute Rehabilitation Services Office: [phone] (Secure chat preferred)   Dorothyann VEAR Maier 11/01/2024, 11:10 AM

## 2024-11-01 NOTE — ED Notes (Signed)
 Trop 2:09

## 2024-11-01 NOTE — ED Provider Notes (Signed)
" °  [place] EM[facility]GENCY DEPARTMENT AT [facility] Provider Assume Care Note I assumed care of Jessica [name] on 11/01/2024 at 7 AM from Dr. Emil.   Briefly, [name] is a 79 y.o. female who: PMHx: diabetes, glaucoma, kidney stones, hypertension, macular degeneration, sleep apnea  P/w generalized weakness; fall Patient was seen in the emergency department overnight for fall, was medically cleared, and was discharged pending mentation  Plan at the time of handoff: Discharge pending transportation   Please refer to the original providers note for additional information regarding the care of Jessica [name].  Reassessment: I personally reassessed the patient: Patient complains of persistent generalized weakness, does not feel that she is safe to go home because she does not have help at home  Vital Signs:  ED Triage Vitals  Encounter Vitals Group     BP 10/31/24 2046 (!) 124/52     Girls Systolic BP Percentile --      Girls Diastolic BP Percentile --      Boys Systolic BP Percentile --      Boys Diastolic BP Percentile --      Pulse Rate 10/31/24 2046 78     Resp 10/31/24 2046 16     Temp 10/31/24 2046 99.9 F (37.7 C)     Temp Source 11/01/24 0106 Oral     SpO2 10/31/24 2046 97 %     Weight 10/31/24 2044 241 lb 13.5 oz (109.7 kg)     Height 10/31/24 2044 4' 11 (1.499 m)     Head Circumference --      Peak Flow --      Pain Score 10/31/24 2043 2     Pain Loc --      Pain Education --      Exclude from Growth Chart --      Hemodynamics:  The patient is hemodynamically stable. Mental Status:  The patient is alert  Additional MDM: Patient discharged pending transportation via [facility] to her back home at the end of my shift, patient typically ambulates with a walker at baseline. [facility] reportedly called nursing stating that they had concerns regarding taking patient back home as they had been called out to the patient's home yesterday twice for falls  where patient was unable to get herself up independently.  Nursing also expressed concern that patient required significant help to ambulate to the bathroom while in the ED, and did not have help present at home.  Reevaluated patient at bedside, states that she does still have sensation of generalized weakness, and states that she does not feel that she will be able to care for self at home, discussed option of TOC, and patient would like to be evaluated by social work and PT/OT, thus placed in [facility] boarding status, home medications and diet ordered.  Disposition: Placed in TOC boarding status   FREDRIK CANDIE Later, MD Emergency Medicine    Later Jerilynn RAMAN, MD 11/01/24 [phone]  "

## 2024-11-01 NOTE — Discharge Instructions (Addendum)
 As we discussed, your workup here this evening is reassuring and shows no evidence of acute injury.  Please hydrate yourself at home as you did have a slightly elevated creatinine.  Your flu test here was negative.  Take the Keflex  for your urinary tract infection. Return to the ED with new symptoms.  Follow-up with your family medicine doctor for further care.

## 2024-11-01 NOTE — Evaluation (Signed)
 Occupational Therapy Evaluation Patient Details Name: Jessica [name] MRN: [id number] DOB: 1946/07/20 Today's Date: 11/01/2024   History of Present Illness   Pt is a 79 year old woman who presented to [facility] on 1/7 with generalized weakness and slipping off her bed. PMH: DM2, glaucoma, kidney stone, HTN, macular degeneration, sleep apnea.     Clinical Impressions Pt lives alone with limited assistance of a neighbor and friend. She does not drive and orders her groceries for delivery. Pt presents with generalized weakness, impaired standing balance and fatigue. Limited ability to assess LB ADLs due to height of stretcher and safety concerns. In the ED setting, pt needs set up to total assist for ADLs, min to mod assist for bed mobility and min assist to stand and step along edge of stretcher with RW. Patient will benefit from continued inpatient follow up therapy, <3 hours/day. Pt is in agreement with recommendation.      If plan is discharge home, recommend the following:   A little help with walking and/or transfers;A lot of help with bathing/dressing/bathroom;Assistance with cooking/housework;Assist for transportation;Help with stairs or ramp for entrance     Functional Status Assessment   Patient has had a recent decline in their functional status and demonstrates the ability to make significant improvements in function in a reasonable and predictable amount of time.     Equipment Recommendations   Other (comment) (defer to next venue)     Recommendations for Other Services         Precautions/Restrictions   Precautions Precautions: Fall Recall of Precautions/Restrictions: Intact Restrictions Weight Bearing Restrictions Per Provider Order: No     Mobility Bed Mobility Overal bed mobility: Needs Assistance Bed Mobility: Supine to Sit, Sit to Supine     Supine to sit: Min assist Sit to supine: Mod assist   General bed mobility comments: assist to raise trunk  and for LEs back onto stretcher    Transfers Overall transfer level: Needs assistance Equipment used: Rolling walker (2 wheels) Transfers: Sit to/from Stand Sit to Stand: Min assist, From elevated surface           General transfer comment: from elevated stretcher      Balance Overall balance assessment: Needs assistance   Sitting balance-Leahy Scale: Good     Standing balance support: Bilateral upper extremity supported, During functional activity, Reliant on assistive device for balance Standing balance-Leahy Scale: Poor                             ADL either performed or assessed with clinical judgement   ADL Overall ADL's : Needs assistance/impaired Eating/Feeding: Independent;Sitting;Bed level   Grooming: Set up;Sitting   Upper Body Bathing: Set up;Sitting   Lower Body Bathing: Maximal assistance;Sitting/lateral leans   Upper Body Dressing : Set up;Sitting   Lower Body Dressing: Maximal assistance;Sit to/from stand               Functional mobility during ADLs: Minimal assistance;Rolling walker (2 wheels) General ADL Comments: limited ability to assess LB ADLs due to safety and height of stretcher, pt had just walked with PT, declined ambulation     Vision Baseline Vision/History: 1 Wears glasses Ability to See in Adequate Light: 0 Adequate Patient Visual Report: No change from baseline       Perception         Praxis         Pertinent Vitals/Pain Pain Assessment Pain Assessment: Faces  Faces Pain Scale: Hurts little more Pain Location: R knee Pain Descriptors / Indicators: Discomfort, Grimacing Pain Intervention(s): Monitored during session, Repositioned     Extremity/Trunk Assessment Upper Extremity Assessment Upper Extremity Assessment: Overall WFL for tasks assessed;Right hand dominant   Lower Extremity Assessment Lower Extremity Assessment: Defer to PT evaluation   Cervical / Trunk Assessment Cervical / Trunk  Assessment: Other exceptions (obesity)   Communication Communication Communication: No apparent difficulties   Cognition Arousal: Alert Behavior During Therapy: WFL for tasks assessed/performed Cognition: No apparent impairments                               Following commands: Intact       Cueing  General Comments   Cueing Techniques: Verbal cues      Exercises     Shoulder Instructions      Home Living Family/patient expects to be discharged to:: Private residence Living Arrangements: Alone Available Help at Discharge: Neighbor;Friend(s);Available PRN/intermittently Type of Home: Other(Comment) Home Access: Ramped entrance (townhome)     Home Layout: One level     Bathroom Shower/Tub: Chief Strategy Officer: Handicapped height Bathroom Accessibility: Yes   Home Equipment: Rollator (4 wheels);BSC/3in1;Toilet riser;Grab bars - tub/shower;Shower seat;Wheelchair - manual;Other (comment)   Additional Comments: Pt has an adjustable bed with rails at home but it is high and has to use a stool to get onto the bed.      Prior Functioning/Environment Prior Level of Function : Independent/Modified Independent             Mobility Comments: reports she uses rollator at baseline ADLs Comments: Pt reports she is normally ind but gave up driving this year and friend assists with transportation. Pt gets groceries delivered.    OT Problem List: Decreased strength;Impaired balance (sitting and/or standing);Obesity;Pain   OT Treatment/Interventions: Self-care/ADL training;Therapeutic activities;Patient/family education;Balance training;DME and/or AE instruction      OT Goals(Current goals can be found in the care plan section)   Acute Rehab OT Goals OT Goal Formulation: With patient Time For Goal Achievement: 11/15/24 Potential to Achieve Goals: Good ADL Goals Pt Will Perform Grooming: with contact guard assist;standing Pt Will Perform  Lower Body Bathing: with contact guard assist;with adaptive equipment;sit to/from stand Pt Will Perform Lower Body Dressing: with contact guard assist;sit to/from stand Pt Will Transfer to Toilet: with contact guard assist;ambulating;bedside commode Pt Will Perform Toileting - Clothing Manipulation and hygiene: with contact guard assist;sit to/from stand Pt/caregiver will Perform Home Exercise Program: Increased strength;Both right and left upper extremity;With theraband;With written HEP provided Additional ADL Goal #1: Pt will complete bed mobility mod I in preparation for ADLs using bed features.   OT Frequency:  Min 2X/week    Co-evaluation              AM-PAC OT 6 Clicks Daily Activity     Outcome Measure Help from another person eating meals?: None Help from another person taking care of personal grooming?: A Little Help from another person toileting, which includes using toliet, bedpan, or urinal?: A Lot Help from another person bathing (including washing, rinsing, drying)?: A Lot Help from another person to put on and taking off regular upper body clothing?: A Little Help from another person to put on and taking off regular lower body clothing?: A Lot 6 Click Score: 16   End of Session Equipment Utilized During Treatment: Rolling walker (2 wheels);Gait belt  Activity Tolerance:  Patient tolerated treatment well Patient left: in bed;with call bell/phone within reach  OT Visit Diagnosis: Unsteadiness on feet (R26.81);Other abnormalities of gait and mobility (R26.89);Muscle weakness (generalized) (M62.81);History of falling (Z91.81)                Time: 1055-1110 OT Time Calculation (min): 15 min Charges:  OT General Charges $OT Visit: 1 Visit OT Evaluation $OT Eval Moderate Complexity: 1 Mod  Jessica [name], OTR/L Acute Rehabilitation Services Office: [phone]   Jessica [name] 11/01/2024, 11:44 AM

## 2024-11-01 NOTE — [place] FL2 (Signed)
 " [place]  MEDICAID FL2 LEVEL OF CARE FORM     IDENTIFICATION  Patient Name: Jessica [name] Birthdate: April 25, 1946 Sex: female Admission Date (Current Location): 10/31/2024  [facility] and [place] Number:  Producer, Television/film/video and Address:  [facility], [address] [place], [place] [address]      Provider Number: [id number]  Attending Physician Name and Address:  Rogelia Jerilynn RAMAN, MD  Relative Name and Phone Number:  Jessica [name] dtr [phone]    Current Level of Care: Hospital Recommended Level of Care: Skilled Nursing Facility Prior Approval Number:    Date Approved/Denied:   PASRR Number: [id number]  Discharge Plan: SNF    Current Diagnoses: Patient Active Problem List   Diagnosis Date Noted   Hyperlipidemia    Diarrhea    Scar 06/19/2019   Vaginal yeast infection 05/26/2019   At risk for adverse drug event 05/02/2019   CHF (congestive heart failure) (HCC) 05/01/2019   Pressure ulcer of sacral region, stage 2 (HCC) 05/01/2019   Displaced supracondylar fracture of distal end of right femur without intracondylar extension (HCC) 04/19/2019   Closed right femoral fracture (HCC) 04/17/2019   Sleep apnea    Glaucoma    Essential hypertension, benign 09/03/2014   Morbid obesity (HCC) 03/06/2014   HTN (hypertension) 03/06/2014   Vertigo 03/06/2014   Type 2 diabetes mellitus (HCC) 03/06/2014   Pure hypercholesterolemia 03/06/2014    Orientation RESPIRATION BLADDER Height & Weight     Self, Time, Situation, Place  Normal Incontinent Weight: 241 lb 13.5 oz (109.7 kg) Height:  4' 11 (149.9 cm)  BEHAVIORAL SYMPTOMS/MOOD NEUROLOGICAL BOWEL NUTRITION STATUS      Incontinent Diet (see dc summary)  AMBULATORY STATUS COMMUNICATION OF NEEDS Skin   Limited Assist Verbally Normal                       Personal Care Assistance Level of Assistance  Bathing, Feeding, Dressing Bathing Assistance: Limited assistance Feeding  assistance: Limited assistance Dressing Assistance: Limited assistance     Functional Limitations Info  Sight, Hearing, Speech Sight Info: Adequate Hearing Info: Adequate Speech Info: Adequate    SPECIAL CARE FACTORS FREQUENCY  PT (By licensed PT), OT (By licensed OT)     PT Frequency: 5x/wk OT Frequency: 5x/wk            Contractures Contractures Info: Not present    Additional Factors Info  Code Status, Allergies Code Status Info: Full Code Allergies Info: Meloxicam  Percocet (Oxycodone-acetaminophen )  Tramadol  Vicodin (Hydrocodone -acetaminophen )  Azithromycin  Iodine  Shellfish Allergy           Current Medications (11/01/2024):  This is the current hospital active medication list Current Facility-Administered Medications  Medication Dose Route Frequency Provider Last Rate Last Admin   cholecalciferol  (VITAMIN D3) 25 MCG (1000 UNIT) tablet 2,000 Units  2,000 Units Oral Daily Stanek, Lawrence S, MD   2,000 Units at 11/01/24 1124   furosemide  (LASIX ) tablet 20 mg  20 mg Oral Daily Stanek, Lawrence S, MD   20 mg at 11/01/24 1124   ibuprofen  (ADVIL ) tablet 600 mg  600 mg Oral Q6H PRN Rogelia Jerilynn RAMAN, MD       insulin  aspart (novoLOG ) injection 0-15 Units  0-15 Units Subcutaneous TID WC Stanek, Lawrence S, MD       insulin  aspart (novoLOG ) injection 0-5 Units  0-5 Units Subcutaneous QHS Rogelia Jerilynn RAMAN, MD       metoprolol   tartrate (LOPRESSOR ) tablet 100 mg  100 mg Oral BID Stanek, Lawrence S, MD       simvastatin  (ZOCOR ) tablet 40 mg  40 mg Oral QPM Rogelia Jerilynn RAMAN, MD       Current Outpatient Medications  Medication Sig Dispense Refill   Cholecalciferol  (VITAMIN D3) 50 MCG (2000 UT) capsule Take 2,000 Units by mouth daily.     Coenzyme Q10 (CO Q-10) 100 MG CAPS Take 100 mg by mouth at bedtime.      dorzolamidel-timolol  (COSOPT ) 22.3-6.8 MG/ML SOLN ophthalmic solution Place 1 drop into both eyes 2 (two) times daily.     furosemide  (LASIX ) 20 MG tablet Take 1  tablet (20 mg total) by mouth daily. 90 tablet 3   Garlic (GARLIQUE PO) Take by mouth.     ibuprofen  (ADVIL ) 600 MG tablet Take 1 tablet (600 mg total) by mouth every 6 (six) hours as needed. 30 tablet 0   insulin  lispro (HUMALOG ) 100 UNIT/ML injection Insulin  pump (Patient taking differently: Inject 3 Units into the skin continuous. Insulin  pump. Maximum daily dose - 150 units) 10 mL 0   Iron-Vitamin C 65-125 MG TABS Take 1 tablet by mouth at bedtime.      melatonin 5 MG TABS Take 5 mg by mouth at bedtime. (Patient not taking: Reported on 11/24/2023)     metFORMIN  (GLUCOPHAGE -XR) 500 MG 24 hr tablet Take 1 tablet (500 mg total) by mouth at bedtime. (Patient not taking: Reported on 11/24/2023) 30 tablet 0   metoprolol  tartrate (LOPRESSOR ) 100 MG tablet Take 1 tablet (100 mg total) by mouth 2 (two) times daily. 180 tablet 3   Multiple Vitamin (MULTIVITAMIN WITH MINERALS) TABS tablet Take 1 tablet by mouth daily.     Multiple Vitamins-Minerals (PRESERVISION AREDS 2 PO) Take 1 tablet by mouth 2 (two) times daily.     nystatin cream (MYCOSTATIN) Apply 1 Application topically 2 (two) times daily.     olmesartan (BENICAR) 40 MG tablet Take 40 mg by mouth daily.     oxybutynin (DITROPAN-XL) 5 MG 24 hr tablet Take 5 mg by mouth at bedtime. (Patient not taking: Reported on 03/30/2024)     phenazopyridine (PYRIDIUM) 95 MG tablet Take 95 mg by mouth 3 (three) times daily as needed for pain. (Patient not taking: Reported on 03/30/2024)     simvastatin  (ZOCOR ) 40 MG tablet Take 1 tablet (40 mg total) by mouth every evening. 30 tablet 0   spironolactone  (ALDACTONE ) 25 MG tablet TAKE 1/2 TABLET(12.5MG      TOTAL) DAILY (Patient not taking: Reported on 03/30/2024) 7 tablet 0   Travoprost, BAK Free, (TRAVATAN) 0.004 % SOLN ophthalmic solution Place 1 drop into both eyes at bedtime.        Discharge Medications: Please see discharge summary for a list of discharge medications.  Relevant Imaging Results:  Relevant Lab  Results:   Additional Information SSN [id number]  Jessica ONEIDA Sharps, LCSW     "

## 2024-11-01 NOTE — Progress Notes (Addendum)
 SNF ref faxed, awaiting bed offers.   Update 2:53pm: CSW presented bed offers, pt accepted bed offer from [facility]. CSW notified admissions at [facility], bed will be ready for pt tomorrow 1/9.

## 2024-11-01 NOTE — ED Notes (Signed)
 Pt bg reading 95 on pump and 79 on POCT. Juice Graham crackers given to help with BG

## 2024-11-01 NOTE — ED Notes (Addendum)
 Patient soiled from bowel movement. Patient cleaned up with wash cloths. New chucks and depends applied to patient. Patient resting in stretcher. Patient ABC's intact. Chest rise and fall noted. Bed in lowest position and locked. Patient denies further needs at this time.

## 2024-11-02 ENCOUNTER — Telehealth: Payer: Self-pay | Admitting: Licensed Clinical Social Worker

## 2024-11-02 ENCOUNTER — Encounter: Payer: Self-pay | Admitting: Licensed Clinical Social Worker

## 2024-11-02 ENCOUNTER — Other Ambulatory Visit (HOSPITAL_COMMUNITY): Payer: Self-pay

## 2024-11-02 LAB — URINALYSIS, W/ REFLEX TO CULTURE (INFECTION SUSPECTED)
Bilirubin Urine: NEGATIVE
Glucose, UA: NEGATIVE mg/dL
Ketones, ur: NEGATIVE mg/dL
Nitrite: NEGATIVE
Protein, ur: 100 mg/dL — AB
Specific Gravity, Urine: 1.02 (ref 1.005–1.030)
WBC, UA: >50 WBC/hpf (ref 0–5)
pH: 6 (ref 5.0–8.0)

## 2024-11-02 LAB — CBG MONITORING, ED
Glucose-Capillary: 82 mg/dL (ref 70–99)
Glucose-Capillary: 109 mg/dL — ABNORMAL HIGH (ref 70–99)

## 2024-11-02 MED ORDER — CEPHALEXIN 500 MG PO CAPS: 500.0000 mg | ORAL_CAPSULE | Freq: Two times a day (BID) | ORAL | 0 refills | Status: AC

## 2024-11-02 MED ORDER — CEPHALEXIN 250 MG PO CAPS: 500.0000 mg | ORAL_CAPSULE | Freq: Two times a day (BID) | ORAL | Status: DC

## 2024-11-02 MED ORDER — CEPHALEXIN 500 MG PO CAPS
500.0000 mg | ORAL_CAPSULE | Freq: Two times a day (BID) | ORAL | 0 refills | Status: DC
  Filled 2024-11-02: qty 14, 7d supply, fill #0

## 2024-11-02 NOTE — Progress Notes (Signed)
 Dc to [address] [address] Call report [phone] Room 702.

## 2024-11-02 NOTE — ED Notes (Signed)
 This RN attempted to give report to nurse at [facility] on two separate occasions but there was no answer. PTAR has been scheduled with medical necessity completed.

## 2024-11-02 NOTE — ED Notes (Signed)
 PTAR has been scheduled for the patient to return to [facility].  ETA: Within the hour.  RN updated on transport status.

## 2024-11-02 NOTE — Patient Outreach (Signed)
 Pt has been paused in [facility] program due to SNF admission. Patient will admit to [name] for [facility] nursing rehab following a hospital admission, today 11/02/24.   Cena Ligas, LCSW Clinical Social Worker [facility] Population Health

## 2024-11-02 NOTE — ED Provider Notes (Signed)
 Emergency Medicine Observation Re-evaluation Note  Jessica [name] is a 79 y.o. female, seen on rounds today.  Pt initially presented to the ED for complaints of Fall and Generalized weakness Currently, the patient is awaiting transport.  Physical Exam  BP (!) 99/53   Pulse 84   Temp 98.3 F (36.8 C) (Oral)   Resp 16   Ht 4' 11 (1.499 m)   Wt 109.7 kg   LMP  (LMP Unknown)   SpO2 96%   BMI 48.85 kg/m  Physical Exam General: Resting comfortably in stretcher Lungs: Normal work of breathing Psych: Calm  ED Course / MDM  EKG:EKG Interpretation Date/Time:  Wednesday October 31 2024 21:36:23 EST Ventricular Rate:  81 PR Interval:  146 QRS Duration:  92 QT Interval:  366 QTC Calculation: 425 R Axis:   20  Text Interpretation: Normal sinus rhythm with sinus arrhythmia Nonspecific ST and T wave abnormality Abnormal ECG No significant change since last tracing Confirmed by Emil Share [phone]) on 11/01/2024 3:38:52 AM  I have reviewed the labs performed to date as well as medications administered while in observation.  Recent changes in the last 24 hours include seen by PT and OT who recommend placement.  Urinalysis is come back and appears to be consistent with UTI so we will start antibiotics.  Plan  Current plan is for placement.  Keflex  for UTI.  Has been pansensitive E. coli in the past. Urine culture sent.     Jessica Lamar BROCKS, MD 11/02/24 [phone]

## 2024-11-03 ENCOUNTER — Other Ambulatory Visit (HOSPITAL_COMMUNITY): Payer: Self-pay

## 2024-11-05 ENCOUNTER — Other Ambulatory Visit: Payer: Self-pay | Admitting: Cardiology

## 2024-11-05 DIAGNOSIS — I5032 Chronic diastolic (congestive) heart failure: Secondary | ICD-10-CM

## 2024-11-05 DIAGNOSIS — I10 Essential (primary) hypertension: Secondary | ICD-10-CM

## 2024-11-05 LAB — URINE CULTURE: Culture: >=100000 — AB

## 2024-11-06 ENCOUNTER — Telehealth (HOSPITAL_BASED_OUTPATIENT_CLINIC_OR_DEPARTMENT_OTHER): Payer: Self-pay | Admitting: *Deleted

## 2024-11-06 NOTE — Telephone Encounter (Signed)
 Post ED Visit - Positive Culture Follow-up  Culture report reviewed by antimicrobial stewardship pharmacist: [facility]. [x]  Leonor Bash, Pharm.D., []  Garrel Crews, Pharm.D., BCPS []  Almarie Lunger, Pharm.D., BCPS []  [place], [address].D., BCPS, AAHIVP []  Rosaline Bihari, Pharm.D., BCPS, AAHIVP []  Vernell Meier, PharmD, BCPS []  Latanya Hint, PharmD, BCPS []  Donald Medley, PharmD, BCPS []  Rocky Bold, PharmD []  Dorothyann Alert, PharmD, BCPS []  Morene Babe, PharmD  [facility] []  Rosaline Edison, PharmD []  Romona Bliss, PharmD []  Dolphus Roller, PharmD []  Veva Seip, Rph []  Vernell Daunt) Leonce, PharmD []  Eva Allis, PharmD []  Rosaline Millet, PharmD []  Iantha Batch, PharmD []  Arvin Gauss, PharmD []  Wanda Hasting, PharmD []  Ronal Rav, PharmD []  Rocky Slade, PharmD []  Bard Jeans, PharmD   Positive urine culture Treated with Cephalexin , organism sensitive to the same and no further patient follow-up is required at this time.  Jama Lisle Blondie 11/06/2024, 1:24 PM

## 2024-11-09 ENCOUNTER — Other Ambulatory Visit: Payer: Self-pay | Admitting: *Deleted

## 2024-11-09 NOTE — Patient Outreach (Signed)
" ° °  11/09/2024  Jessica [name] 1946/09/02 [id number]   Jessica [name]  previously admitted to [facility] SNF under [facility] SNF waiver.   Secure message sent to Admissions coordinator to request collaboration and an update regarding transition plans and any identifiable barriers.   Will continue to follow.   Pablo Hurst, MSN, RN, BSN [place]  [facility], [facility] RN Care Manager Direct Dial: [phone]                   "

## 2024-11-21 ENCOUNTER — Other Ambulatory Visit: Payer: Self-pay | Admitting: *Deleted

## 2024-11-21 NOTE — Patient Outreach (Signed)
" ° °  11/21/2024  Jessica [name] [birth year] [id number]   [name] resides in [facility]. [facility] SNF waiver utilized for admission.   Collaboration with Abigail, [place] social worker. [name] is progressing well with therapy. She plans to return home alone. Has supportive family and neighbors who check on her frequently. Likely transition home soon. Projected discharge date not known yet.   Will continue to follow.   Pablo Hurst, MSN, RN, BSN [place]  [facility], [facility] Commun[facility] RN Care Manager Direct Dial: [phone]                "

## 2024-11-29 ENCOUNTER — Telehealth: Payer: Self-pay | Admitting: Licensed Clinical Social Worker

## 2024-11-29 ENCOUNTER — Encounter: Payer: Self-pay | Admitting: Licensed Clinical Social Worker

## 2024-11-29 NOTE — Patient Outreach (Signed)
 LCSW called patient and there was no answer. LCSW also called [facility] and spoke to front desk staff. Patient is still at their facility and they did not have a established discharge date. Patient will remained paused at this time.   Jessica Ligas, LCSW Clinical Social Worker [facility] Population Health

## 2024-12-27 ENCOUNTER — Telehealth: Admitting: Licensed Clinical Social Worker
